# Patient Record
Sex: Female | Born: 1960 | Race: White | Hispanic: No | Smoking: Former smoker
Health system: Southern US, Community
[De-identification: ages and names within clinical notes are randomized; demographics above are authoritative.]

## PROBLEM LIST (undated history)

## (undated) DIAGNOSIS — I5032 Chronic diastolic (congestive) heart failure: Secondary | ICD-10-CM

## (undated) DIAGNOSIS — C3490 Malignant neoplasm of unspecified part of unspecified bronchus or lung: Secondary | ICD-10-CM

## (undated) DIAGNOSIS — R32 Unspecified urinary incontinence: Secondary | ICD-10-CM

## (undated) DIAGNOSIS — I10 Essential (primary) hypertension: Secondary | ICD-10-CM

## (undated) DIAGNOSIS — E119 Type 2 diabetes mellitus without complications: Secondary | ICD-10-CM

## (undated) DIAGNOSIS — I82409 Acute embolism and thrombosis of unspecified deep veins of unspecified lower extremity: Secondary | ICD-10-CM

## (undated) DIAGNOSIS — J189 Pneumonia, unspecified organism: Secondary | ICD-10-CM

## (undated) HISTORY — DX: Malignant neoplasm of unspecified part of unspecified bronchus or lung: C34.90

## (undated) HISTORY — PX: BLADDER SURGERY: SHX569

---

## 2007-08-23 ENCOUNTER — Emergency Department: Payer: Self-pay | Admitting: Unknown Physician Specialty

## 2011-04-25 ENCOUNTER — Emergency Department: Payer: Self-pay | Admitting: Emergency Medicine

## 2011-05-11 ENCOUNTER — Emergency Department: Payer: Self-pay | Admitting: Emergency Medicine

## 2011-07-04 ENCOUNTER — Ambulatory Visit: Payer: Self-pay | Admitting: Unknown Physician Specialty

## 2011-07-06 ENCOUNTER — Ambulatory Visit: Payer: Self-pay | Admitting: Unknown Physician Specialty

## 2013-01-09 ENCOUNTER — Inpatient Hospital Stay: Payer: Self-pay | Admitting: Internal Medicine

## 2013-01-09 LAB — CBC WITH DIFFERENTIAL/PLATELET
Basophil #: 0.1 10*3/uL (ref 0.0–0.1)
Basophil %: 1.1 %
Eosinophil #: 0.2 10*3/uL (ref 0.0–0.7)
Eosinophil %: 2 %
HCT: 39.6 % (ref 35.0–47.0)
HGB: 13.5 g/dL (ref 12.0–16.0)
LYMPHS ABS: 2.2 10*3/uL (ref 1.0–3.6)
Lymphocyte %: 25.3 %
MCH: 30.6 pg (ref 26.0–34.0)
MCHC: 34.1 g/dL (ref 32.0–36.0)
MCV: 90 fL (ref 80–100)
MONOS PCT: 5.3 %
Monocyte #: 0.5 x10 3/mm (ref 0.2–0.9)
NEUTROS PCT: 66.3 %
Neutrophil #: 5.7 10*3/uL (ref 1.4–6.5)
Platelet: 245 10*3/uL (ref 150–440)
RBC: 4.41 10*6/uL (ref 3.80–5.20)
RDW: 13.7 % (ref 11.5–14.5)
WBC: 8.6 10*3/uL (ref 3.6–11.0)

## 2013-01-09 LAB — BASIC METABOLIC PANEL
ANION GAP: 4 — AB (ref 7–16)
BUN: 14 mg/dL (ref 7–18)
CALCIUM: 8.6 mg/dL (ref 8.5–10.1)
Chloride: 106 mmol/L (ref 98–107)
Co2: 29 mmol/L (ref 21–32)
Creatinine: 0.91 mg/dL (ref 0.60–1.30)
GLUCOSE: 122 mg/dL — AB (ref 65–99)
OSMOLALITY: 279 (ref 275–301)
Potassium: 3.5 mmol/L (ref 3.5–5.1)
SODIUM: 139 mmol/L (ref 136–145)

## 2013-01-09 LAB — TROPONIN I: Troponin-I: <0.02 ng/mL

## 2013-01-10 LAB — SEDIMENTATION RATE: Erythrocyte Sed Rate: 6 mm/hr (ref 0–30)

## 2013-01-11 ENCOUNTER — Inpatient Hospital Stay (HOSPITAL_COMMUNITY)
Admission: RE | Admit: 2013-01-11 | Discharge: 2013-02-02 | DRG: 166 | Disposition: A | Discharge status: Home / Self Care | Payer: Medicaid Other | Source: Other Acute Inpatient Hospital | Attending: Pulmonary Disease | Admitting: Pulmonary Disease

## 2013-01-11 ENCOUNTER — Encounter (HOSPITAL_COMMUNITY): Payer: Self-pay | Admitting: *Deleted

## 2013-01-11 ENCOUNTER — Encounter (HOSPITAL_COMMUNITY)
Admission: RE | Disposition: A | Discharge status: Home / Self Care | Payer: Medicaid Other | Source: Other Acute Inpatient Hospital | Attending: Pulmonary Disease

## 2013-01-11 ENCOUNTER — Other Ambulatory Visit: Payer: Self-pay

## 2013-01-11 DIAGNOSIS — Z87891 Personal history of nicotine dependence: Secondary | ICD-10-CM

## 2013-01-11 DIAGNOSIS — Z79899 Other long term (current) drug therapy: Secondary | ICD-10-CM

## 2013-01-11 DIAGNOSIS — Z801 Family history of malignant neoplasm of trachea, bronchus and lung: Secondary | ICD-10-CM

## 2013-01-11 DIAGNOSIS — I318 Other specified diseases of pericardium: Secondary | ICD-10-CM | POA: Diagnosis present

## 2013-01-11 DIAGNOSIS — I319 Disease of pericardium, unspecified: Secondary | ICD-10-CM | POA: Diagnosis present

## 2013-01-11 DIAGNOSIS — I314 Cardiac tamponade: Secondary | ICD-10-CM | POA: Diagnosis present

## 2013-01-11 DIAGNOSIS — J189 Pneumonia, unspecified organism: Secondary | ICD-10-CM | POA: Diagnosis present

## 2013-01-11 DIAGNOSIS — J181 Lobar pneumonia, unspecified organism: Secondary | ICD-10-CM

## 2013-01-11 DIAGNOSIS — I369 Nonrheumatic tricuspid valve disorder, unspecified: Secondary | ICD-10-CM

## 2013-01-11 DIAGNOSIS — I4510 Unspecified right bundle-branch block: Secondary | ICD-10-CM | POA: Diagnosis present

## 2013-01-11 DIAGNOSIS — Z8042 Family history of malignant neoplasm of prostate: Secondary | ICD-10-CM

## 2013-01-11 DIAGNOSIS — Z8249 Family history of ischemic heart disease and other diseases of the circulatory system: Secondary | ICD-10-CM

## 2013-01-11 DIAGNOSIS — Z8261 Family history of arthritis: Secondary | ICD-10-CM

## 2013-01-11 DIAGNOSIS — R042 Hemoptysis: Secondary | ICD-10-CM | POA: Diagnosis not present

## 2013-01-11 DIAGNOSIS — R918 Other nonspecific abnormal finding of lung field: Secondary | ICD-10-CM

## 2013-01-11 DIAGNOSIS — J9819 Other pulmonary collapse: Secondary | ICD-10-CM | POA: Diagnosis present

## 2013-01-11 DIAGNOSIS — C7951 Secondary malignant neoplasm of bone: Secondary | ICD-10-CM | POA: Diagnosis present

## 2013-01-11 DIAGNOSIS — C3490 Malignant neoplasm of unspecified part of unspecified bronchus or lung: Secondary | ICD-10-CM

## 2013-01-11 DIAGNOSIS — C7952 Secondary malignant neoplasm of bone marrow: Secondary | ICD-10-CM

## 2013-01-11 DIAGNOSIS — C3410 Malignant neoplasm of upper lobe, unspecified bronchus or lung: Principal | ICD-10-CM | POA: Diagnosis present

## 2013-01-11 DIAGNOSIS — R0781 Pleurodynia: Secondary | ICD-10-CM

## 2013-01-11 DIAGNOSIS — Z803 Family history of malignant neoplasm of breast: Secondary | ICD-10-CM

## 2013-01-11 DIAGNOSIS — Z23 Encounter for immunization: Secondary | ICD-10-CM

## 2013-01-11 DIAGNOSIS — J90 Pleural effusion, not elsewhere classified: Secondary | ICD-10-CM | POA: Diagnosis present

## 2013-01-11 DIAGNOSIS — J9600 Acute respiratory failure, unspecified whether with hypoxia or hypercapnia: Secondary | ICD-10-CM | POA: Diagnosis not present

## 2013-01-11 HISTORY — PX: PERICARDIAL TAP: SHX5486

## 2013-01-11 HISTORY — DX: Pneumonia, unspecified organism: J18.9

## 2013-01-11 LAB — CBC
HCT: 43.6 % (ref 36.0–46.0)
Hemoglobin: 14.6 g/dL (ref 12.0–15.0)
MCH: 30.9 pg (ref 26.0–34.0)
MCHC: 33.5 g/dL (ref 30.0–36.0)
MCV: 92.4 fL (ref 78.0–100.0)
PLATELETS: 270 10*3/uL (ref 150–400)
RBC: 4.72 MIL/uL (ref 3.87–5.11)
RDW: 13.9 % (ref 11.5–15.5)
WBC: 13.8 10*3/uL — AB (ref 4.0–10.5)

## 2013-01-11 LAB — BODY FLUID CELL COUNT WITH DIFFERENTIAL
Eos, Fluid: 6 %
LYMPHS FL: 34 %
MONOCYTE-MACROPHAGE-SEROUS FLUID: 13 % — AB (ref 50–90)
Neutrophil Count, Fluid: 46 % — ABNORMAL HIGH (ref 0–25)
WBC FLUID: 4780 uL — AB (ref 0–1000)

## 2013-01-11 LAB — LACTATE DEHYDROGENASE, PLEURAL OR PERITONEAL FLUID: LD, Fluid: 1704 U/L — ABNORMAL HIGH (ref 3–23)

## 2013-01-11 LAB — PROTEIN, BODY FLUID: TOTAL PROTEIN, FLUID: 5.1 g/dL

## 2013-01-11 LAB — VANCOMYCIN, TROUGH: VANCOMYCIN, TROUGH: 8 ug/mL — AB (ref 10–20)

## 2013-01-11 LAB — CREATININE, FLUID (PLEURAL, PERITONEAL, JP DRAINAGE): Creat, Fluid: 0.8 mg/dL

## 2013-01-11 LAB — GRAM STAIN

## 2013-01-11 LAB — SEDIMENTATION RATE: SED RATE: 2 mm/h (ref 0–22)

## 2013-01-11 LAB — MRSA PCR SCREENING: MRSA by PCR: NEGATIVE

## 2013-01-11 LAB — CREATININE, SERUM: CREATININE: 0.72 mg/dL (ref 0.50–1.10)

## 2013-01-11 LAB — AMYLASE, BODY FLUID: Amylase, Fluid: 343 U/L

## 2013-01-11 LAB — GLUCOSE, SEROUS FLUID: GLUCOSE FL: 29 mg/dL

## 2013-01-11 SURGERY — PERICARDIAL TAP
Anesthesia: LOCAL

## 2013-01-11 MED ORDER — VANCOMYCIN HCL IN DEXTROSE 1-5 GM/200ML-% IV SOLN
1000.0000 mg | Freq: Two times a day (BID) | INTRAVENOUS | Status: DC
  Administered 2013-01-12 – 2013-01-14 (×5): 1000 mg via INTRAVENOUS
  Filled 2013-01-11 (×6): qty 200

## 2013-01-11 MED ORDER — PROMETHAZINE HCL 25 MG/ML IJ SOLN
12.5000 mg | Freq: Four times a day (QID) | INTRAMUSCULAR | Status: DC | PRN
  Administered 2013-01-11: 12.5 mg via INTRAVENOUS
  Administered 2013-01-11: 25 mg via INTRAVENOUS
  Administered 2013-01-12 (×3): 12.5 mg via INTRAVENOUS
  Administered 2013-01-13: 25 mg via INTRAVENOUS
  Administered 2013-01-13: 12.5 mg via INTRAVENOUS
  Administered 2013-01-14 (×2): 25 mg via INTRAVENOUS
  Administered 2013-01-15: 12.5 mg via INTRAVENOUS
  Administered 2013-01-15 – 2013-01-16 (×3): 25 mg via INTRAVENOUS
  Administered 2013-01-17 (×2): 12.5 mg via INTRAVENOUS
  Administered 2013-01-18 – 2013-01-21 (×7): 25 mg via INTRAVENOUS
  Administered 2013-01-22: 12.5 mg via INTRAVENOUS
  Filled 2013-01-11 (×23): qty 1

## 2013-01-11 MED ORDER — SODIUM CHLORIDE 0.9 % IJ SOLN
3.0000 mL | Freq: Two times a day (BID) | INTRAMUSCULAR | Status: DC
  Administered 2013-01-11 – 2013-02-02 (×29): 3 mL via INTRAVENOUS

## 2013-01-11 MED ORDER — SODIUM CHLORIDE 0.9 % IV SOLN
INTRAVENOUS | Status: DC
  Administered 2013-01-19: 10 mL/h via INTRAVENOUS
  Administered 2013-01-24: 20:00:00 via INTRAVENOUS

## 2013-01-11 MED ORDER — HYDROCODONE-ACETAMINOPHEN 5-325 MG PO TABS
1.0000 | ORAL_TABLET | ORAL | Status: DC | PRN
  Administered 2013-01-16 – 2013-02-02 (×41): 2 via ORAL
  Filled 2013-01-11 (×42): qty 2

## 2013-01-11 MED ORDER — ONDANSETRON HCL 4 MG/2ML IJ SOLN: 4.0000 mg | Freq: Four times a day (QID) | INTRAMUSCULAR | Status: DC | PRN

## 2013-01-11 MED ORDER — ONDANSETRON HCL 4 MG PO TABS
4.0000 mg | ORAL_TABLET | Freq: Four times a day (QID) | ORAL | Status: DC | PRN
Start: 2013-01-11 — End: 2013-01-13

## 2013-01-11 MED ORDER — IBUPROFEN 600 MG PO TABS
600.0000 mg | ORAL_TABLET | Freq: Four times a day (QID) | ORAL | Status: DC | PRN
  Administered 2013-01-15: 600 mg via ORAL
  Filled 2013-01-11 (×3): qty 1

## 2013-01-11 MED ORDER — ACETAMINOPHEN 325 MG PO TABS
650.0000 mg | ORAL_TABLET | Freq: Four times a day (QID) | ORAL | Status: DC | PRN
Start: 2013-01-11 — End: 2013-02-02

## 2013-01-11 MED ORDER — MORPHINE SULFATE 2 MG/ML IJ SOLN
2.0000 mg | INTRAMUSCULAR | Status: DC | PRN
  Administered 2013-01-11 – 2013-01-14 (×7): 2 mg via INTRAVENOUS
  Filled 2013-01-11 (×7): qty 1

## 2013-01-11 MED ORDER — ENOXAPARIN SODIUM 40 MG/0.4ML ~~LOC~~ SOLN
40.0000 mg | SUBCUTANEOUS | Status: DC
  Administered 2013-01-12 – 2013-02-02 (×21): 40 mg via SUBCUTANEOUS
  Filled 2013-01-11 (×22): qty 0.4

## 2013-01-11 MED ORDER — ZOLPIDEM TARTRATE 5 MG PO TABS
5.0000 mg | ORAL_TABLET | Freq: Every evening | ORAL | Status: DC | PRN
  Administered 2013-01-16 – 2013-02-01 (×13): 5 mg via ORAL
  Filled 2013-01-11 (×13): qty 1

## 2013-01-11 MED ORDER — PROMETHAZINE HCL 12.5 MG PO TABS: 12.5000 mg | ORAL_TABLET | Freq: Once | ORAL | Status: DC

## 2013-01-11 MED ORDER — VANCOMYCIN HCL IN DEXTROSE 1-5 GM/200ML-% IV SOLN
1000.0000 mg | Freq: Two times a day (BID) | INTRAVENOUS | Status: DC
  Filled 2013-01-11: qty 200

## 2013-01-11 MED ORDER — PIPERACILLIN-TAZOBACTAM 3.375 G IVPB
3.3750 g | Freq: Three times a day (TID) | INTRAVENOUS | Status: DC
  Filled 2013-01-11 (×2): qty 50

## 2013-01-11 MED ORDER — PIPERACILLIN-TAZOBACTAM 3.375 G IVPB
3.3750 g | Freq: Three times a day (TID) | INTRAVENOUS | Status: DC
  Administered 2013-01-11 – 2013-01-12 (×2): 3.375 g via INTRAVENOUS
  Filled 2013-01-11 (×5): qty 50

## 2013-01-11 MED ORDER — VANCOMYCIN HCL IN DEXTROSE 1-5 GM/200ML-% IV SOLN
1000.0000 mg | INTRAVENOUS | Status: AC
  Administered 2013-01-11: 1000 mg via INTRAVENOUS
  Filled 2013-01-11: qty 200

## 2013-01-11 MED ORDER — ACETAMINOPHEN 650 MG RE SUPP: 650.0000 mg | Freq: Four times a day (QID) | RECTAL | Status: DC | PRN

## 2013-01-11 MED ORDER — INFLUENZA VAC SPLIT QUAD 0.5 ML IM SUSP
0.5000 mL | INTRAMUSCULAR | Status: DC
  Filled 2013-01-11: qty 0.5

## 2013-01-11 NOTE — Progress Notes (Signed)
  Echocardiogram 2D Echocardiogram has been performed.  [name] FRANCES 01/11/2013, 5:21 PM

## 2013-01-11 NOTE — H&P (Signed)
History and Physical  Patient ID: [name] MRN: [id number], SOB: [birth year] 53 y.o. Date of Encounter: 01/11/2013, 2:11 PM  Primary Physician: No PCP Per Patient Primary Cardiologist: none  Chief Complaint: Shortness of Breath  HPI: 53 y.o. female who presented to [facility] on 01/11/2013 with complaints cardiac tamponade. The patient was admitted to [facility] 01/09/2013. She presented with shortness of breath progressive over 3 weeks. She's had a cough for several months. She's had no fever or chills. There has been no hemoptysis. Her initial evaluation in the emergency department there demonstrated a moderate-sized pericardial effusion as well as extensive right lung consolidation as well as a right-sided parapneumonic effusion. An echocardiogram was done this morning to evaluate her pericardial effusion. This demonstrated a large effusion with echo signs of cardiac tamponade. The patient was transferred emergently for pericardiocentesis.  On arrival here in the cardiac Cath Lab, she complains of continued shortness of breath. She denies orthopnea, PND, or edema. She's had no substernal chest pain. She has no personal history of cardiac disease. She quit smoking cigarettes in the 1990s.   Past Medical History  Diagnosis Date  . Pneumonia      Surgical History: History reviewed. No pertinent past surgical history.   Home Meds: Prior to Admission medications   Not on File    Allergies: No Known Allergies  History   Social History  . Marital Status: Single    Spouse Name: N/A    Number of Children: N/A  . Years of Education: N/A   Occupational History  . Not on file.   Social History Main Topics  . Smoking status: Former Research scientist (life sciences)  . Smokeless tobacco: Never Used  . Alcohol Use: Yes     Comment: occasionally has a drink  . Drug Use: No  . Sexual Activity: Not Currently   Other Topics Concern  . Not on file   Social History  Narrative  . No narrative on file     History reviewed. No pertinent family history.  Review of Systems: General: negative for chills, fever, night sweats or weight changes.  ENT: negative for rhinorrhea or epistaxis Cardiovascular: see history of present illness Dermatological: negative for rash Respiratory: See history of present illness GI: negative for nausea, vomiting, diarrhea, bright red blood per rectum, melena, or hematemesis GU: no hematuria, urgency, or frequency Neurologic: negative for visual changes, syncope, headache, or dizziness Heme: no easy bruising or bleeding Endo: negative for excessive thirst, thyroid disorder, or flushing Musculoskeletal: negative for joint pain or swelling, negative for myalgias All other systems reviewed and are otherwise negative except as noted above.  Physical Exam: Pulse 95, resp. rate 14, height 5\' 6"  (1.676 m), weight 76.6 kg (168 lb 14 oz), SpO2 96.00%. General: Well developed, well nourished, alert and oriented, in mild acute distress. HEENT: Normocephalic, atraumatic, sclera non-icteric, no xanthomas, nares are without discharge.  Neck: Supple. Carotids 2+ without bruits. JVP normal Lungs: Decreased breath sounds bilaterally right greater than left Breathing is unlabored. Heart: Tachycardic and regular with normal S1 and S2. No murmurs, rubs, or gallops appreciated. Abdomen: Soft, non-tender, non-distended with normoactive bowel sounds. No hepatomegaly. No rebound/guarding. No obvious abdominal masses. Back: No CVA tenderness Msk:  Strength and tone appear normal for age. Extremities: No clubbing, cyanosis, or edema.  Distal pedal pulses are 2+ and equal bilaterally. Neuro: CNII-XII intact, moves all extremities spontaneously. Psych:  Responds to questions appropriately with a normal affect.   Labs:  No results found for this basename: WBC,  HGB,  HCT,  MCV,  PLT   No results found for this basename: NA, K, CL, CO2, BUN,  CREATININE, CALCIUM, LABALBU, PROT, BILITOT, ALKPHOS, ALT, AST, GLUCOSE,  in the last 168 hours No results found for this basename: CKTOTAL, CKMB, TROPONINI,  in the last 72 hours No results found for this basename: CHOL,  HDL,  LDLCALC,  TRIG   No results found for this basename: DDIMER    Radiology/Studies:  No results found.   ASSESSMENT AND PLAN:  This is a 53 year-old woman with pericardial tamponade.   I have personally reviewed her echo images. She has a large circumferential pericardial effusion with echo evidence of cardiac tamponade. The patient is tachycardic and short of breath. I think emergency pericardiocentesis is clearly indicated. I have reviewed the risks, indications, and alternatives with the patient. She agrees to proceed.  Following the procedure, will admit to the cardiac ICU and monitor her pericardial drainage. Anticipate withdrawal of her pericardial tube within 24-48 hours. Will check a 2-D echocardiogram tomorrow. I last the hospitalist service to see her in consultation to help with treatment of her lung process. Appreciate their assistance in advance. Further plans pending the result of her pericardiocentesis.  Deatra James  01/11/2013, 2:11 PM

## 2013-01-11 NOTE — CV Procedure (Signed)
   Cardiac Catheterization Procedure Note  Name: [name] MRN: [id number] DOB: [birth year]  Procedure: Pericardiocentesis, fluoroscopic guidance  Indication: Cardiac tamponade  History: This is a 53 year old woman transferred from [facility] with evidence of cardiac And 9. She presented with several months of cough but worsening shortness of breath over 2 weeks. She was found to have a pericardial effusion on chest CT. This was thought to be related to multifocal pneumonia. An echocardiogram this morning demonstrated a large pericardial effusion with associated right ventricular collapse and significant variability of mitral inflow consistent with echo evidence of pericardial tap and on. She was transferred directly to the cardiac catheterization lab.  Procedural details: Risks, indications, and alternatives to pericardiocentesis were reviewed with the patient. Informed consent was obtained. The patient's subxiphoid area was prepped, draped, and anesthetized with 1% lidocaine. Using a lumbar puncture needle and stylette, the needle was passed under the xiphoid process and directed to the left posterior shoulder. The needle was advanced while infiltrating lidocaine and drawing back on the syringe. The pericardial space was entered and a wire encircled the heart. A pericardial drain was placed and 700 cc of bloody fluid was obtained.   The patient tolerated the patient well. There were no immediate procedural complications.   Sherren Mocha 01/11/2013, 1:37 PM

## 2013-01-11 NOTE — Progress Notes (Signed)
ANTIBIOTIC CONSULT NOTE - INITIAL  Pharmacy Consult for Vancomycin and Zosyn Indication: pneumonia  No Known Allergies  Patient Measurements: Height: 5\' 6"  (167.6 cm) Weight: 168 lb 14 oz (76.6 kg) IBW/kg (Calculated) : 59.3 Adjusted Body Weight:   Vital Signs: Temp: 97.9 F (36.6 C) (01/11 1600) Temp src: Oral (01/11 1600) BP: 112/57 mmHg (01/11 1800) Pulse Rate: 106 (01/11 1800) Intake/Output from previous day:   Intake/Output from this shift: Total I/O In: 435 [P.O.:240; I.V.:195] Out: 625 [Urine:375; Drains:250]  Labs: No results found for this basename: WBC, HGB, PLT, LABCREA, CREATININE,  in the last 72 hours CrCl is unknown because no creatinine reading has been taken. No results found for this basename: VANCOTROUGH, Corlis Leak, VANCORANDOM, [place], [place], [place], [place], TOBRAPEAK, TOBRARND, AMIKACINPEAK, AMIKACINTROU, AMIKACIN,  in the last 72 hours   Microbiology: Recent Results (from the past 720 hour(s))  GRAM STAIN     Status: None   Collection Time    01/11/13 12:10 PM      Result Value Range Status   Specimen Description PERICARDIAL FLUID   Final   Special Requests NONE   Final   Gram Stain     Final   Value: RARE WBC PRESENT,BOTH PMN AND MONONUCLEAR     NO ORGANISMS SEEN   Report Status 01/11/2013 FINAL   Final  MRSA PCR SCREENING     Status: None   Collection Time    01/11/13  1:13 PM      Result Value Range Status   MRSA by PCR NEGATIVE  NEGATIVE Final   Comment:            The GeneXpert MRSA Assay (FDA     approved for NASAL specimens     only), is one component of a     comprehensive MRSA colonization     surveillance program. It is not     intended to diagnose MRSA     infection nor to guide or     monitor treatment for     MRSA infections.    Medical History: Past Medical History  Diagnosis Date  . Pneumonia     Medications:  Scheduled:  . [START ON 01/12/2013] enoxaparin (LOVENOX) injection  40 mg Subcutaneous  Q24H  . [START ON 01/12/2013] influenza vac split quadrivalent PF  0.5 mL Intramuscular Tomorrow-1000  . sodium chloride  3 mL Intravenous Q12H   Assessment: 53yo female transferred from [facility] for pericardiocentesis, to continue Vancomycin and Zosyn for pneumonia.  Pericardial fluid cultures have been sent as well.  Per d/w Pharmacist at [facility], she was on 750mg  IV q12 with a subtherapeutic trough this AM.  Dose was increased to 1000mg  IV q12, but appears was not given as it was not charted.  Cr 0.91 on 1/9.  Goal of Therapy:  Vancomycin trough level 15-20 mcg/ml  Plan:  1-  Vancomycin 1000mg  IV q12, 1st dose now 2-  Zosyn 3.375gm IV q8, 4 hr infusion 3-  F/U cx and watch renal function  Gracy Bruins, [facility]

## 2013-01-11 NOTE — Consult Note (Signed)
[facility] Consult  [name] AJG:[id number] DOB: [birth year] DOA: 01/11/2013  Consulting physician: Dr. Burt Knack PCP: No PCP Per Patient    HPI: [name] is a 53 y.o. female with no significant medical history who presented to presented to [facility] on 01/09/13 for cough and shortness of breath.  Cough was non productive, no fevers chills or hemoptysis.  On presentation there was no fever and WBC normal. Chest xray showed extensive infiltrate of the right lung consistent with pneumonia and left perihilar infiltrate.  CT showed extensive multifocal infiltrates in the right middle and upper lobes with small right sided parapneumonic effusion.  Many nonspecific pulmonary nodules in both right and left lungs fields representing hematogenous distribution of infection or metastatic disease. Moderate pericardial effusion.  She was transferred to [facility] for pericardiocentesis.  Antibiotics Vancomycin 1/9>> Cefepime 1/9>>  Imaging (reports/CDs from [place] not in EPIC) CT chest 01/09/13: extensive multifocal infiltrates in the right middle and upper lobes with small right sided parapneumonic effusion.  Many nonspecific pulmonary nodules in both right and left lungs fields representing hematogenous distribution of infection or metastatic disease. Moderate pericardial effusion.   Review of Systems:  Constitutional: No weight loss, night sweats, Fevers, chills, fatigue.  HEENT: No headaches, Difficulty swallowing,Tooth/dental problems,Sore throat, No sneezing, itching, ear ache, nasal congestion, post nasal drip,  Cardio-vascular: No chest pain, Orthopnea, PND, swelling in lower extremities, anasarca, dizziness, palpitations  GI: No heartburn, indigestion, abdominal pain, nausea, vomiting, diarrhea, change in bowel habits, loss of appetite  Resp: Has had shortness of breath with exertion and at rest. No excess mucus, no productive cough, has had non-productive cough, No coughing up  of blood.No change in color of mucus.No wheezing.No chest wall deformity  Skin: no rash or lesions.  GU: no dysuria, change in color of urine, no urgency or frequency. No flank pain.  Musculoskeletal: No joint pain or swelling. No decreased range of motion. No back pain.  Psych: No change in mood or affect. No depression or anxiety. No memory loss.   Past Medical History  Diagnosis Date  . Pneumonia    History reviewed. No pertinent past surgical history. Social History:  reports that she has quit smoking. She has never used smokeless tobacco. She reports that she drinks alcohol. She reports that she does not use illicit drugs.  No Known Allergies  Family History  Problem Relation Age of Onset  . Coronary artery disease Mother 5   daughter with RA Mother with breast cancer  Prior to Admission medications   Not on File   Physical Exam: Filed Vitals:   01/11/13 1600  BP: 131/69  Pulse: 106  Temp:   Resp: 24    BP 131/69  Pulse 106  Temp(Src) 98.1 F (36.7 C) (Oral)  Resp 24  Ht 5' 6"  (1.676 m)  Wt 76.6 kg (168 lb 14 oz)  BMI 27.27 kg/m2  SpO2 95%  General:  Sedated, groggy, responds to voice  Eyes: PERRL, normal lids, irises & conjunctiva ENT: grossly normal hearing, lips & tongue are dry Neck: no LAD, masses or thyromegaly Cardiovascular: tachycardic, regular, no m/r/g. No LE edema. Telemetry: SR, no arrhythmias  Respiratory: diffuse crackles, no w/r/r. Rapid shallow respirations Abdomen: soft, ntnd Skin: no rash Musculoskeletal: grossly normal tone BUE/BLE Psychiatric: slightly sedated, calm Neurologic: grossly non-focal.          Labs on Admission:  Basic Metabolic Panel: No results found for this basename: NA, K, CL, CO2, GLUCOSE, BUN, CREATININE, CALCIUM, MG,  PHOS,  in the last 168 hours Liver Function Tests: No results found for this basename: AST, ALT, ALKPHOS, BILITOT, PROT, ALBUMIN,  in the last 168 hours No results found for this basename:  LIPASE, AMYLASE,  in the last 168 hours No results found for this basename: AMMONIA,  in the last 168 hours CBC: No results found for this basename: WBC, NEUTROABS, HGB, HCT, MCV, PLT,  in the last 168 hours Cardiac Enzymes: No results found for this basename: CKTOTAL, CKMB, CKMBINDEX, TROPONINI,  in the last 168 hours  BNP (last 3 results) No results found for this basename: PROBNP,  in the last 8760 hours CBG: No results found for this basename: GLUCAP,  in the last 168 hours  Radiological Exams on Admission: No results found.  EKG: NSR  Assessment/Plan Active Problems:   Pericardial tamponade   1. Pericardial effusion with Abnormal findings on CT of lung: DDx includes infection, autoimmune disorder and malignancy.  Send pericardial fluid for gram stain, AFB, culture, cytology  Check ANA, RF, CCP, ESR, ANCA  Await cytology report for further malignancy work up- she is not up to date on mammogram, her mother had breast cancer, she does have significant risk factors  Will continue broad spectrum antibiotics until pericardial fluid results are back due to possibility of infection.    Code Status: full Family Communication: spoke with patient and daughter at bedside  Time spent: 13 minutes  [facility] Pager [phone]

## 2013-01-11 NOTE — Progress Notes (Signed)
53 year old woman brought directly to the cardiac catheterization lab from [facility].  The patient is hospitalized with shortness of breath. She was found to have multifocal pneumonia and a large pericardial effusion. I was called by Dr. Fletcher Anon this morning who has read her echocardiogram. She has a large circumferential pericardial effusion with echo evidence of tamponade.  I have personally reviewed her echo images. There is a is clear evidence of tamponade with RV/RA collapse.  I have explained the risks, indications, and alternatives to pericardiocentesis with the patient. She understands and agrees to proceed.  Full H&P to follow.  Sherren Mocha 01/11/2013 11:39 AM

## 2013-01-12 ENCOUNTER — Inpatient Hospital Stay (HOSPITAL_COMMUNITY): Payer: Medicaid Other

## 2013-01-12 DIAGNOSIS — I319 Disease of pericardium, unspecified: Secondary | ICD-10-CM

## 2013-01-12 DIAGNOSIS — I314 Cardiac tamponade: Secondary | ICD-10-CM

## 2013-01-12 DIAGNOSIS — R918 Other nonspecific abnormal finding of lung field: Secondary | ICD-10-CM

## 2013-01-12 DIAGNOSIS — J13 Pneumonia due to Streptococcus pneumoniae: Secondary | ICD-10-CM

## 2013-01-12 DIAGNOSIS — C3490 Malignant neoplasm of unspecified part of unspecified bronchus or lung: Secondary | ICD-10-CM | POA: Diagnosis present

## 2013-01-12 LAB — BASIC METABOLIC PANEL
BUN: 12 mg/dL (ref 6–23)
CO2: 25 meq/L (ref 19–32)
Calcium: 8.4 mg/dL (ref 8.4–10.5)
Chloride: 103 mEq/L (ref 96–112)
Creatinine, Ser: 0.83 mg/dL (ref 0.50–1.10)
GFR calc Af Amer: >90 mL/min (ref >90)
GFR calc non Af Amer: 81 mL/min — ABNORMAL LOW (ref >90)
GLUCOSE: 127 mg/dL — AB (ref 70–99)
POTASSIUM: 3.7 meq/L (ref 3.7–5.3)
Sodium: 141 mEq/L (ref 137–147)

## 2013-01-12 LAB — CYCLIC CITRUL PEPTIDE ANTIBODY, IGG: Cyclic Citrullin Peptide Ab: <2 U/mL (ref 0.0–5.0)

## 2013-01-12 LAB — CBC
HCT: 38.7 % (ref 36.0–46.0)
Hemoglobin: 12.9 g/dL (ref 12.0–15.0)
MCH: 30.4 pg (ref 26.0–34.0)
MCHC: 33.3 g/dL (ref 30.0–36.0)
MCV: 91.1 fL (ref 78.0–100.0)
PLATELETS: 215 10*3/uL (ref 150–400)
RBC: 4.25 MIL/uL (ref 3.87–5.11)
RDW: 13.8 % (ref 11.5–15.5)
WBC: 7.7 10*3/uL (ref 4.0–10.5)

## 2013-01-12 LAB — ANCA SCREEN W REFLEX TITER
ATYPICAL P-ANCA SCREEN: NEGATIVE
c-ANCA Screen: NEGATIVE
p-ANCA Screen: NEGATIVE

## 2013-01-12 LAB — STREP PNEUMONIAE URINARY ANTIGEN: STREP PNEUMO URINARY ANTIGEN: NEGATIVE

## 2013-01-12 LAB — COMPREHENSIVE METABOLIC PANEL
ALK PHOS: 81 U/L (ref 39–117)
ALT: 29 U/L (ref 0–35)
AST: 16 U/L (ref 0–37)
Albumin: 2.9 g/dL — ABNORMAL LOW (ref 3.5–5.2)
BILIRUBIN TOTAL: 0.7 mg/dL (ref 0.3–1.2)
BUN: 12 mg/dL (ref 6–23)
CHLORIDE: 103 meq/L (ref 96–112)
CO2: 27 meq/L (ref 19–32)
Calcium: 8.3 mg/dL — ABNORMAL LOW (ref 8.4–10.5)
Creatinine, Ser: 0.85 mg/dL (ref 0.50–1.10)
GFR calc Af Amer: >90 mL/min (ref >90)
GFR, EST NON AFRICAN AMERICAN: 79 mL/min — AB (ref >90)
Glucose, Bld: 104 mg/dL — ABNORMAL HIGH (ref 70–99)
POTASSIUM: 3.8 meq/L (ref 3.7–5.3)
SODIUM: 142 meq/L (ref 137–147)
Total Protein: 5.7 g/dL — ABNORMAL LOW (ref 6.0–8.3)

## 2013-01-12 LAB — RHEUMATOID FACTOR: Rhuematoid fact SerPl-aCnc: <10 IU/mL (ref <=14)

## 2013-01-12 LAB — PH, BODY FLUID: pH, Fluid: 8

## 2013-01-12 LAB — TSH: TSH: 4.07 u[IU]/mL (ref 0.350–4.500)

## 2013-01-12 LAB — ANA: Anti Nuclear Antibody(ANA): NEGATIVE

## 2013-01-12 MED ORDER — FUROSEMIDE 10 MG/ML IJ SOLN
20.0000 mg | Freq: Once | INTRAMUSCULAR | Status: AC
  Administered 2013-01-12: 20 mg via INTRAVENOUS

## 2013-01-12 MED ORDER — LEVOFLOXACIN IN D5W 750 MG/150ML IV SOLN
750.0000 mg | INTRAVENOUS | Status: DC
  Administered 2013-01-12 – 2013-01-14 (×3): 750 mg via INTRAVENOUS
  Filled 2013-01-12 (×4): qty 150

## 2013-01-12 MED FILL — Lidocaine HCl Local Preservative Free (PF) Inj 1%: INTRAMUSCULAR | Qty: 30 | Status: AC

## 2013-01-12 MED FILL — Midazolam HCl Inj 2 MG/2ML (Base Equivalent): INTRAMUSCULAR | Qty: 2 | Status: AC

## 2013-01-12 MED FILL — Heparin Sodium (Porcine) 2 Unit/ML in Sodium Chloride 0.9%: INTRAMUSCULAR | Qty: 500 | Status: AC

## 2013-01-12 MED FILL — Fentanyl Citrate Inj 0.05 MG/ML: INTRAMUSCULAR | Qty: 2 | Status: AC

## 2013-01-12 NOTE — Consult Note (Signed)
PULMONARY/CCM NOTE  Requesting MD/Service: NIshan/Cards Date of admission: 1/11 Date of consult: 1/12 Reason for consultation: abnormal CXR  Pt Profile:  Previously healthy 36 yoF admitted to [facility] 1/09 with progressive dyspnea and transferred to [facility] (Cards Service) 1/11 for eval and mgmt of pericardial effusion and early tamponade. Underwent pericardial drain placement on admission with 700 cc's of bloody fluid removed. Repeat CXR 1/12 with worsening infiltrates. PCCM asked to eval     HPI:  As above. She indicates that she has noted exertional dyspnea progressive since Sept 2014. Her symptoms became intolerable by the time that she presented to [place]. She has not noted any fevers or weight loss. She has largely nonproductive cough. Denies hemoptysis and CP. Has no known TB exposures or other sick contacts. No prior TB skin tests. No HIV RFs. No significant occupational exposures.   PMH: No chronic illnesses. No major surgeries. No chronic medications  MEDICATIONS:  Reviewed  History   Social History  . Marital Status: Single    Spouse Name: N/A    Number of Children: N/A  . Years of Education: N/A   Occupational History  . Not on file.   Social History Main Topics  . Smoking status: Former Research scientist (life sciences)  . Smokeless tobacco: Never Used  . Alcohol Use: Yes     Comment: occasionally has a drink  . Drug Use: No  . Sexual Activity: Not Currently   Other Topics Concern  . Not on file   Social History Narrative   The patient is single. She has 3 children. She is a nonsmoker. She does not drink alcohol.    Family History  Problem Relation Age of Onset  . Coronary artery disease Mother 28    ROS - As per HPI. Otherwise a detailed ROS is N/C  Filed Vitals:   01/12/13 1200 01/12/13 1400 01/12/13 1600 01/12/13 1800  BP: 112/85 100/79 100/70 101/72  Pulse: 102 103 105 107  Temp: 98.3 F (36.8 C)  98.8 F (37.1 C)   TempSrc: Oral  Oral   Resp: 24 26 21 19    Height:      Weight:      SpO2: 93% 90% 92% 94%    EXAM:  Gen: WDWN in NAD HEENT: NCAT, EOMI, PERRL Neck: No JVD or LAN Lungs: Rales in RUL posteriorly, slight rhonchi anteriorly, no wheezes Cardiovascular: RRR s M Abdomen: Soft, NT, NABS Ext: no C/C/E Neuro: No focal deficits  DATA:  I have reviewed all of today's lab results. Relevant abnormalities are discussed in the A/P section  CXR 1/09: Diffuse infiltrate on R, predominantly RUL CT chest 1/09: Extensive infiltrate in RUL/RML, reticulonodular infiltrate in RLL and lesser extent on L, small R effusion CXR 1/12: progressive consolidation RUL. Increased R effusion. Increased IS prominence on L  IMPRESSION:   Subacute dyspnea and cough since 09/2012 Extensive pulmonary infiltrates R>L with consolidation RML,RUL and reticulonodular changes throughout Large pericardial effusion with tamponade - s/p drainage  DISC: Constellation of findings most worrisome for malignancy vs systemic infection such as TB  PLAN:  F/U pericardial fluid cytology and AFB studies Check urine strep and legionella antigens Check Quantiferon Gold Sputum for AFB X 3 Check HIV serologies Cont vanc Change pip-tazo to Levofloxacin Follow CXR Consider FOB depending on results of above    Merton Border, MD ; [facility] service Mobile [phone].  After 5:30 PM or weekends, call [phone]

## 2013-01-12 NOTE — Progress Notes (Signed)
Echocardiogram 2D Echocardiogram limited has been performed.  [name] 01/12/2013, 3:37 PM

## 2013-01-12 NOTE — Progress Notes (Signed)
Patient ID: [name], female   DOB: [birth year], 53 y.o.   MRN: [id number]    Subjective:  Still with dyspnea   Objective:  Filed Vitals:   01/12/13 0400 01/12/13 0500 01/12/13 0600 01/12/13 0700  BP: 115/69 113/85 105/65 106/71  Pulse: 92 95 89 91  Temp:      TempSrc:      Resp: 13 22 15 16   Height:      Weight:      SpO2: 94% 96% 95% 94%    Intake/Output from previous day:  Intake/Output Summary (Last 24 hours) at 01/12/13 0851 Last data filed at 01/12/13 0700  Gross per 24 hour  Intake  842.5 ml  Output    925 ml  Net  -82.5 ml    Physical Exam: Affect appropriate Obese white female  HEENT: normal Neck supple with no adenopathy JVP normal no bruits no thyromegaly Lungs clear with no wheezing and good diaphragmatic motion Heart:  S1/S2 no murmur, no rub, gallop or click PMI normal Abdomen: benighn, BS positve, no tenderness, no AAA  Subxiphoid pericardial drain no bruit.  No HSM or HJR Distal pulses intact with no bruits No edema Neuro non-focal Skin warm and dry No muscular weakness   Lab Results: Basic Metabolic Panel:  Recent Labs  01/11/13 1720 01/12/13 0240  NA  --  142  K  --  3.8  CL  --  103  CO2  --  27  GLUCOSE  --  104*  BUN  --  12  CREATININE 0.72 0.85  CALCIUM  --  8.3*   Liver Function Tests:  Recent Labs  01/12/13 0240  AST 16  ALT 29  ALKPHOS 81  BILITOT 0.7  PROT 5.7*  ALBUMIN 2.9*   CBC:  Recent Labs  01/11/13 1720 01/12/13 0240  WBC 13.8* 7.7  HGB 14.6 12.9  HCT 43.6 38.7  MCV 92.4 91.1  PLT 270 215   Thyroid Function Tests:  Recent Labs  01/11/13 1720  TSH 4.070    Imaging: Portable Chest 1 View  01/12/2013   CLINICAL DATA:  Dyspnea and possible pneumonia  EXAM: PORTABLE CHEST - 1 VIEW  COMPARISON:  PA and lateral chest x-ray dated January 09, 2013 and a chest CT scan of the same day  FINDINGS: There has developed dense consolidation of the right upper lobe. The right costophrenic angle is blunted  and there is obscuration of the right hemidiaphragm consistent with pleural effusion. There is likely collapse of the right middle lobe in addition to the right upper lobe. The left lung is well-expanded. The interstitial markings of the left lung are diffusely more prominent today. The cardiac silhouette is not enlarged.  IMPRESSION: The findings are worrisome for near total right upper lobe collapse as well as right middle lobe collapse and a right pleural effusion. This has progressed significantly since the previous study. There have also developed progressively increased interstitial markings in the left lung consistent with interstitial edema or developing pneumonia.   Electronically Signed   By: David  Martinique   On: 01/12/2013 07:42    Cardiac Studies:  ECG:   NSR low voltage nonspecific ST/T wave changes    Telemetry:  NSR    Echo:   [place] tamponade  Repeat pending today   Medications:   . enoxaparin (LOVENOX) injection  40 mg Subcutaneous Q24H  . influenza vac split quadrivalent PF  0.5 mL Intramuscular Tomorrow-1000  . piperacillin-tazobactam (ZOSYN)  IV  3.375 g  Intravenous Q8H  . sodium chloride  3 mL Intravenous Q12H  . vancomycin  1,000 mg Intravenous Q12H     . sodium chloride Stopped (01/11/13 2200)    Assessment/Plan:  Pericardial Effusion:  S/P drainage  75cc total output  Check limited bedside echo today If no effusion will pull drain 1/13 [facility];s note indicated bloody effusion cytology and indices pending Pulm:  CXR with ? RUL/RML collapse  Still dyspnic  Will ask pulmonary to see regarding need for repeat CT and bronchoscopy She quit smoking in 1998 ID:  Not acting like infection  May benefit from thoracentesis  Will ask pulmonary to decide.  Continue antibiotics  IM following as well.    Jenkins Rouge 01/12/2013, 8:51 AM

## 2013-01-12 NOTE — Care Management Note (Signed)
    Page 1 of 1   01/12/2013     11:05:19 AM   CARE MANAGEMENT NOTE 01/12/2013  Patient:  [name], [name]   Account Number:  [id number]  Date Initiated:  01/12/2013  Documentation initiated by:  Elissa Hefty  Subjective/Objective Assessment:   adm w pericardial tamponade     Action/Plan:   lives alone   Anticipated DC Date:     Anticipated DC Plan:           Choice offered to / List presented to:             Status of service:   Medicare Important Message given?   (If response is "NO", the following Medicare IM given date fields will be blank) Date Medicare IM given:   Date Additional Medicare IM given:    Discharge Disposition:    Per UR Regulation:  Reviewed for med. necessity/level of care/duration of stay  If discussed at [place] of Stay Meetings, dates discussed:    Comments:

## 2013-01-12 NOTE — Consult Note (Signed)
[facility]  Consult NOTE  [name] WNU:[id number] DOB: 1960/10/27 DOA: 01/11/2013 PCP: No PCP Per Patient  Assessment/Plan: 1. Pericardial effusion with tamponade: tamponade physiology seems resolved after pericardiocentesis.  Still producing serosanguinous fluid from drain.  High WBCs in pericardial fluid, gram stain WBC's no organisms, culture no growth to date, high LDH, AFB pending, cytology pending.  Rheum labs initially negative for RA/lupus other autoimmune disorder CCP, ANCA, ANA pending. 2. Pneumonia vs pulmonary malignancy: Chest xray with worsening aeration and partial collapse right upper and middle lobes and right pleural effusion.  Continue broad spectrum antibiotics for possible infection though no increase in peripheral WBC's and only one measured high temp of 100.  Have discussed with ID and will not place on TB precautions as no risk factors for exposure, no fevers, sweats, weight loss, hemoptysis. Have consulted pulmonology for further recommendations, possible thoracentesis. 3. Overall picture most concerning for malignancy.  She is at high risk as she has not followed with any type of screening, no mammograms.  Mother with breast cancer in early 42's.  Await cytology results. Consider CT abdomen.  Code Status: full Family Communication: spoke with patient at bedside Disposition Plan: inpatient  Procedures:  Pericardiocentesis 1/11  Imaging (reports/CDs from [place] not in EPIC)  CT chest 01/09/13: extensive multifocal infiltrates in the right middle and upper lobes with small right sided parapneumonic effusion. Many nonspecific pulmonary nodules in both right and left lungs fields representing hematogenous distribution of infection or metastatic disease. Moderate pericardial effusion.   Antibiotics: Vancomycin 1/9>>  Cefepime 1/9>>1/11 Zosyn 1/11>>  HPI/Subjective: [name] is a 53 y.o. female with no significant medical history who presented to presented  to [facility] on 01/09/13 for cough and shortness of breath. Cough was non productive, no fevers chills or hemoptysis. On presentation there was no fever and WBC normal. Chest xray showed extensive infiltrate of the right lung consistent with pneumonia and left perihilar infiltrate. CT showed extensive multifocal infiltrates in the right middle and upper lobes with small right sided parapneumonic effusion. Many nonspecific pulmonary nodules in both right and left lungs fields representing hematogenous distribution of infection or metastatic disease. Moderate pericardial effusion. She was transferred to [facility] for pericardiocentesis.  Today she is alert, oriented comfortable. No respiratory distress at rest. Minimal pain from drain site.  Objective: Filed Vitals:   01/12/13 0700  BP: 106/71  Pulse: 91  Temp:   Resp: 16    Intake/Output Summary (Last 24 hours) at 01/12/13 0838 Last data filed at 01/12/13 0700  Gross per 24 hour  Intake  842.5 ml  Output    925 ml  Net  -82.5 ml   Filed Weights   01/11/13 1300 01/12/13 0324  Weight: 76.6 kg (168 lb 14 oz) 75.1 kg (165 lb 9.1 oz)    Exam:   General:  Alert, calm, no distress  Cardiovascular: RRR no mrg  Respiratory: shallow guarded resps, decreased breath sounds in right lung fields, no rhonchi, wheezes or cough  Abdomen: BS+, soft, slightly tender to palpation diffusely  Musculoskeletal: no edema  Data Reviewed: Basic Metabolic Panel:  Recent Labs Lab 01/11/13 1720 01/12/13 0240  NA  --  142  K  --  3.8  CL  --  103  CO2  --  27  GLUCOSE  --  104*  BUN  --  12  CREATININE 0.72 0.85  CALCIUM  --  8.3*   Liver Function Tests:  Recent Labs Lab 01/12/13 0240  AST 16  ALT 29  ALKPHOS 81  BILITOT 0.7  PROT 5.7*  ALBUMIN 2.9*   No results found for this basename: LIPASE, AMYLASE,  in the last 168 hours No results found for this basename: AMMONIA,  in the last 168 hours CBC:  Recent Labs Lab  01/11/13 1720 01/12/13 0240  WBC 13.8* 7.7  HGB 14.6 12.9  HCT 43.6 38.7  MCV 92.4 91.1  PLT 270 215   Cardiac Enzymes: No results found for this basename: CKTOTAL, CKMB, CKMBINDEX, TROPONINI,  in the last 168 hours BNP (last 3 results) No results found for this basename: PROBNP,  in the last 8760 hours CBG: No results found for this basename: GLUCAP,  in the last 168 hours  Recent Results (from the past 240 hour(s))  BODY FLUID CULTURE     Status: None   Collection Time    01/11/13 12:10 PM      Result Value Range Status   Specimen Description PERICARDIAL FLUID   Final   Special Requests NONE   Final   Gram Stain     Final   Value: RARE WBC PRESENT,BOTH PMN AND MONONUCLEAR     NO ORGANISMS SEEN     Performed at [facility]     Performed at [facility]   Culture     Final   Value: NO GROWTH 1 DAY     Performed at [facility]   Report Status PENDING   Incomplete  GRAM STAIN     Status: None   Collection Time    01/11/13 12:10 PM      Result Value Range Status   Specimen Description PERICARDIAL FLUID   Final   Special Requests NONE   Final   Gram Stain     Final   Value: RARE WBC PRESENT,BOTH PMN AND MONONUCLEAR     NO ORGANISMS SEEN   Report Status 01/11/2013 FINAL   Final  MRSA PCR SCREENING     Status: None   Collection Time    01/11/13  1:13 PM      Result Value Range Status   MRSA by PCR NEGATIVE  NEGATIVE Final   Comment:            The GeneXpert MRSA Assay (FDA     approved for NASAL specimens     only), is one component of a     comprehensive MRSA colonization     surveillance program. It is not     intended to diagnose MRSA     infection nor to guide or     monitor treatment for     MRSA infections.     Studies: Portable Chest 1 View  01/12/2013   CLINICAL DATA:  Dyspnea and possible pneumonia  EXAM: PORTABLE CHEST - 1 VIEW  COMPARISON:  PA and lateral chest x-ray dated January 09, 2013 and a chest CT scan of the same day   FINDINGS: There has developed dense consolidation of the right upper lobe. The right costophrenic angle is blunted and there is obscuration of the right hemidiaphragm consistent with pleural effusion. There is likely collapse of the right middle lobe in addition to the right upper lobe. The left lung is well-expanded. The interstitial markings of the left lung are diffusely more prominent today. The cardiac silhouette is not enlarged.  IMPRESSION: The findings are worrisome for near total right upper lobe collapse as well as right middle lobe collapse and a right pleural effusion.  This has progressed significantly since the previous study. There have also developed progressively increased interstitial markings in the left lung consistent with interstitial edema or developing pneumonia.   Electronically Signed   By: David  Martinique   On: 01/12/2013 07:42    Scheduled Meds: . enoxaparin (LOVENOX) injection  40 mg Subcutaneous Q24H  . influenza vac split quadrivalent PF  0.5 mL Intramuscular Tomorrow-1000  . piperacillin-tazobactam (ZOSYN)  IV  3.375 g Intravenous Q8H  . sodium chloride  3 mL Intravenous Q12H  . vancomycin  1,000 mg Intravenous Q12H   Continuous Infusions: . sodium chloride Stopped (01/11/13 2200)    Active Problems:   Pericardial tamponade    Time spent: 35 minutes    [place] [facility] Pager [phone]. If 7PM-7AM, please contact night-coverage at [email or web address], password [facility] 01/12/2013, 8:38 AM  LOS: 1 day

## 2013-01-13 ENCOUNTER — Inpatient Hospital Stay (HOSPITAL_COMMUNITY): Payer: Medicaid Other

## 2013-01-13 LAB — CBC
HEMATOCRIT: 42.3 % (ref 36.0–46.0)
HEMOGLOBIN: 14 g/dL (ref 12.0–15.0)
MCH: 30.4 pg (ref 26.0–34.0)
MCHC: 33.1 g/dL (ref 30.0–36.0)
MCV: 92 fL (ref 78.0–100.0)
Platelets: 227 10*3/uL (ref 150–400)
RBC: 4.6 MIL/uL (ref 3.87–5.11)
RDW: 13.7 % (ref 11.5–15.5)
WBC: 8.5 10*3/uL (ref 4.0–10.5)

## 2013-01-13 LAB — BASIC METABOLIC PANEL
BUN: 13 mg/dL (ref 6–23)
CO2: 28 mEq/L (ref 19–32)
Calcium: 8.9 mg/dL (ref 8.4–10.5)
Chloride: 99 mEq/L (ref 96–112)
Creatinine, Ser: 0.78 mg/dL (ref 0.50–1.10)
GFR calc Af Amer: >90 mL/min (ref >90)
GFR calc non Af Amer: >90 mL/min (ref >90)
GLUCOSE: 96 mg/dL (ref 70–99)
POTASSIUM: 3.6 meq/L — AB (ref 3.7–5.3)
SODIUM: 140 meq/L (ref 137–147)

## 2013-01-13 LAB — LEGIONELLA ANTIGEN, URINE: Legionella Antigen, Urine: NEGATIVE

## 2013-01-13 LAB — HIV ANTIBODY (ROUTINE TESTING W REFLEX): HIV: NONREACTIVE

## 2013-01-13 MED ORDER — FENTANYL CITRATE 0.05 MG/ML IJ SOLN
INTRAMUSCULAR | Status: AC
  Administered 2013-01-13: 50 ug
  Filled 2013-01-13: qty 2

## 2013-01-13 MED ORDER — PANTOPRAZOLE SODIUM 40 MG PO TBEC
40.0000 mg | DELAYED_RELEASE_TABLET | Freq: Every day | ORAL | Status: DC
  Administered 2013-01-13 – 2013-01-15 (×3): 40 mg via ORAL
  Filled 2013-01-13 (×3): qty 1

## 2013-01-13 NOTE — Consult Note (Signed)
[facility]  Consult NOTE  [name] NTI:[id number] DOB: 09/28/1960 DOA: 01/11/2013 PCP: No PCP Per Patient  Assessment/Plan: 1. Pericardial effusion with tamponade: drain removed.High WBCs in pericardial fluid, gram stain WBC's no organisms, culture no growth to date, high LDH, AFB negative 1/4, cytology with reactive mesothelial cells.  Rheum labs negative for RA/lupus other autoimmune disorder. 2. Pneumonia vs pulmonary malignancy: Chest stable with partial collapse right upper and middle lobes and right pleural effusion.  Continue broad spectrum antibiotics for possible infection though no increase in peripheral WBC's and only one measured high temp of 100.  Now on airborne precautions with quantiferon pending. Bronchoscopy planned for tomorrow.  3. Overall picture most concerning for malignancy. Possible mesothelioma.  Would consider oncology consultation.  Code Status: full Family Communication: spoke with patient at bedside Disposition Plan: inpatient  Procedures:  Pericardiocentesis 1/11  Imaging (reports/CDs from [place] not in EPIC)  CT chest 01/09/13: extensive multifocal infiltrates in the right middle and upper lobes with small right sided parapneumonic effusion. Many nonspecific pulmonary nodules in both right and left lungs fields representing hematogenous distribution of infection or metastatic disease. Moderate pericardial effusion.   Antibiotics: Vancomycin 1/9>>  Cefepime 1/9>>1/11 Zosyn 1/11>>1/12 levoquin 1/12>>  HPI/Subjective: [name] is a 53 y.o. female with no significant medical history who presented to presented to [facility] on 01/09/13 for cough and shortness of breath. Cough was non productive, no fevers chills or hemoptysis. On presentation there was no fever and WBC normal. Chest xray showed extensive infiltrate of the right lung consistent with pneumonia and left perihilar infiltrate. CT showed extensive multifocal infiltrates in  the right middle and upper lobes with small right sided parapneumonic effusion. Many nonspecific pulmonary nodules in both right and left lungs fields representing hematogenous distribution of infection or metastatic disease. Moderate pericardial effusion. She was transferred to [facility] for pericardiocentesis.  Today she is alert, oriented comfortable. No respiratory distress. More comfortable after removal of drain.  Objective: Filed Vitals:   01/13/13 1500  BP: 108/65  Pulse: 113  Temp:   Resp: 30    Intake/Output Summary (Last 24 hours) at 01/13/13 1646 Last data filed at 01/13/13 1501  Gross per 24 hour  Intake   1385 ml  Output     80 ml  Net   1305 ml   Filed Weights   01/11/13 1300 01/12/13 0324 01/13/13 0600  Weight: 76.6 kg (168 lb 14 oz) 75.1 kg (165 lb 9.1 oz) 74.2 kg (163 lb 9.3 oz)    Exam:   General:  Alert, calm, no distress  Cardiovascular: RRR no mrg  Respiratory: scattered wheezes, good air movement  Abdomen: BS+, soft, slightly tender to palpation diffusely  Musculoskeletal: no edema  Data Reviewed: Basic Metabolic Panel:  Recent Labs Lab 01/11/13 1720 01/12/13 0240 01/12/13 0930 01/13/13 0241  NA  --  142 141 140  K  --  3.8 3.7 3.6*  CL  --  103 103 99  CO2  --  27 25 28   GLUCOSE  --  104* 127* 96  BUN  --  12 12 13   CREATININE 0.72 0.85 0.83 0.78  CALCIUM  --  8.3* 8.4 8.9   Liver Function Tests:  Recent Labs Lab 01/12/13 0240  AST 16  ALT 29  ALKPHOS 81  BILITOT 0.7  PROT 5.7*  ALBUMIN 2.9*   No results found for this basename: LIPASE, AMYLASE,  in the last 168 hours No results found for this basename: AMMONIA,  in the last 168 hours CBC:  Recent Labs Lab 01/11/13 1720 01/12/13 0240 01/13/13 0241  WBC 13.8* 7.7 8.5  HGB 14.6 12.9 14.0  HCT 43.6 38.7 42.3  MCV 92.4 91.1 92.0  PLT 270 215 227   Cardiac Enzymes: No results found for this basename: CKTOTAL, CKMB, CKMBINDEX, TROPONINI,  in the last 168 hours BNP (last  3 results) No results found for this basename: PROBNP,  in the last 8760 hours CBG: No results found for this basename: GLUCAP,  in the last 168 hours  Recent Results (from the past 240 hour(s))  BODY FLUID CULTURE     Status: None   Collection Time    01/11/13 12:10 PM      Result Value Range Status   Specimen Description PERICARDIAL FLUID   Final   Special Requests NONE   Final   Gram Stain     Final   Value: RARE WBC PRESENT,BOTH PMN AND MONONUCLEAR     NO ORGANISMS SEEN     Performed at [facility]     Performed at [facility]   Culture     Final   Value: NO GROWTH 2 DAYS     Performed at [facility]   Report Status PENDING   Incomplete  GRAM STAIN     Status: None   Collection Time    01/11/13 12:10 PM      Result Value Range Status   Specimen Description PERICARDIAL FLUID   Final   Special Requests NONE   Final   Gram Stain     Final   Value: RARE WBC PRESENT,BOTH PMN AND MONONUCLEAR     NO ORGANISMS SEEN   Report Status 01/11/2013 FINAL   Final  AFB CULTURE WITH SMEAR     Status: None   Collection Time    01/11/13 12:10 PM      Result Value Range Status   Specimen Description FLUID PERICARDIAL   Final   Special Requests ADD    Final   ACID FAST SMEAR     Final   Value: NO ACID FAST BACILLI SEEN     Performed at [facility]   Culture     Final   Value: CULTURE WILL BE EXAMINED FOR 6 WEEKS BEFORE ISSUING A FINAL REPORT     Performed at [facility]   Report Status PENDING   Incomplete  MRSA PCR SCREENING     Status: None   Collection Time    01/11/13  1:13 PM      Result Value Range Status   MRSA by PCR NEGATIVE  NEGATIVE Final   Comment:            The GeneXpert MRSA Assay (FDA     approved for NASAL specimens     only), is one component of a     comprehensive MRSA colonization     surveillance program. It is not     intended to diagnose MRSA     infection nor to guide or     monitor treatment for     MRSA  infections.     Studies: Dg Chest Port 1 View  01/13/2013   CLINICAL DATA:  Respiratory failure  EXAM: PORTABLE CHEST - 1 VIEW  COMPARISON:  01/12/2013  FINDINGS: Dense consolidation in the right upper lobe similar to the prior study. Right lower lobe infiltrate also unchanged. There is a right pleural effusion.  Mild left lower lobe  infiltrate and small effusion. Pericardial drain is noted. Negative for heart failure.  IMPRESSION: No significant change dense consolidation right upper lobe. There also is bibasilar atelectasis and small effusions.   Electronically Signed   By: Franchot Gallo M.D.   On: 01/13/2013 07:22   Portable Chest 1 View  01/12/2013   CLINICAL DATA:  Dyspnea and possible pneumonia  EXAM: PORTABLE CHEST - 1 VIEW  COMPARISON:  PA and lateral chest x-ray dated January 09, 2013 and a chest CT scan of the same day  FINDINGS: There has developed dense consolidation of the right upper lobe. The right costophrenic angle is blunted and there is obscuration of the right hemidiaphragm consistent with pleural effusion. There is likely collapse of the right middle lobe in addition to the right upper lobe. The left lung is well-expanded. The interstitial markings of the left lung are diffusely more prominent today. The cardiac silhouette is not enlarged.  IMPRESSION: The findings are worrisome for near total right upper lobe collapse as well as right middle lobe collapse and a right pleural effusion. This has progressed significantly since the previous study. There have also developed progressively increased interstitial markings in the left lung consistent with interstitial edema or developing pneumonia.   Electronically Signed   By: David  Martinique   On: 01/12/2013 07:42    Scheduled Meds: . enoxaparin (LOVENOX) injection  40 mg Subcutaneous Q24H  . influenza vac split quadrivalent PF  0.5 mL Intramuscular Tomorrow-1000  . levofloxacin (LEVAQUIN) IV  750 mg Intravenous Q24H  . pantoprazole  40  mg Oral Daily  . sodium chloride  3 mL Intravenous Q12H  . vancomycin  1,000 mg Intravenous Q12H   Continuous Infusions: . sodium chloride Stopped (01/11/13 2200)    Active Problems:   Pericardial tamponade   Pulmonary infiltrates   Multifocal lung consolidation    Time spent: 35 minutes    [facility] [facility] Pager [phone]. If 7PM-7AM, please contact night-coverage at [email or web address], password [facility] 01/13/2013, 4:46 PM  LOS: 2 days

## 2013-01-13 NOTE — Progress Notes (Signed)
PULMONARY/CCM NOTE  Requesting MD/Service: NIshan/Cards Date of admission: 1/11 Date of consult: 1/12 Reason for consultation: abnormal CXR  Pt Profile:  Previously healthy 65 yoF admitted to [facility] 1/09 with progressive dyspnea and transferred to [facility] ([facility]) 1/11 for eval and mgmt of pericardial effusion and early tamponade. Underwent pericardial drain placement on admission with 700 cc's of bloody fluid removed. Repeat CXR 1/12 with worsening infiltrates. PCCM asked to eval     MICRO/PATH: Pericardial 1/11 >> GS negative, NGTD >>  Pericardial AFB 1/11 >> smear negative HIV serology 1/12 >> NEG Strep Ag 1/12 >> NEG Legionella Ag 1/12 >> NEG Pericardial cytology 1/11 >> atypical cells, likely reactive mesothelial cells  SUBJ: No new complaints. No overt distress  Filed Vitals:   01/13/13 1200 01/13/13 1300 01/13/13 1400 01/13/13 1500  BP: 116/71 97/76 122/70 108/65  Pulse: 109 119 113 113  Temp: 98.9 F (37.2 C)     TempSrc: Oral     Resp: 18 18 16 30   Height:      Weight:      SpO2: 92% 88% 94% 93%    EXAM:  Gen: WDWN in NAD HEENT: NCAT, EOMI, PERRL Neck: No JVD or LAN Lungs: Rales in RUL posteriorly, slight rhonchi anteriorly, no wheezes Cardiovascular: RRR s M Abdomen: Soft, NT, NABS Ext: no C/C/E Neuro: No focal deficits  DATA:  I have reviewed all of today's lab results. Relevant abnormalities are discussed in the A/P section  CXR: [place] consolidated RUL and R effusion  IMPRESSION:   Subacute dyspnea and cough since 09/2012 Extensive pulmonary infiltrates R>L with consolidation RML,RUL and reticulonodular changes throughout Large bloody pericardial effusion with tamponade - s/p drainage  DISC: Concern remains for malignancy vs chronic infectious such as TB/fungal  PLAN:  Check urine strep and legionella antigens F/U Quantiferon Gold Sputum for AFB X 3 - unable to produce any sputum Cont vanc and Levofloxacin Follow CXR  intermittently FOB planned 1/14 afternoon  If Quant Gold is positive, will cancel FOB and Rx TB empirically    Merton Border, MD ; [facility] service Mobile [phone].  After 5:30 PM or weekends, call [phone]

## 2013-01-13 NOTE — Progress Notes (Signed)
01/13/2013- Resp Care Note- Pt has had a non-productive cough since September as per patient.  Unable to obtain sputum from patient.  Collection cup left at bedside.  S Jeanna Giuffre rrt, rcp

## 2013-01-13 NOTE — Progress Notes (Signed)
Patient ID: [name], female   DOB: Oct 14, 1960, 53 y.o.   MRN: [id number]    Subjective:  Dyspnea unchanged moved to isolation   Objective:  Filed Vitals:   01/13/13 0300 01/13/13 0500 01/13/13 0600 01/13/13 0700  BP: 100/67 96/61 105/70 117/76  Pulse: 93 95 96 101  Temp:  98.3 F (36.8 C)    TempSrc:      Resp: 14 17 14 17   Height:      Weight:   163 lb 9.3 oz (74.2 kg)   SpO2: 96% 92% 95% 94%    Intake/Output from previous day:  Intake/Output Summary (Last 24 hours) at 01/13/13 0743 Last data filed at 01/13/13 0600  Gross per 24 hour  Intake   1580 ml  Output   1720 ml  Net   -140 ml    Physical Exam: Affect appropriate Obese white female  HEENT: normal Neck supple with no adenopathy JVP normal no bruits no thyromegaly Lungs clear with no wheezing and good diaphragmatic motion Heart:  S1/S2 no murmur, no rub, gallop or click PMI normal Abdomen: benighn, BS positve, no tenderness, no AAA  Subxiphoid pericardial drain no bruit.  No HSM or HJR Distal pulses intact with no bruits No edema Neuro non-focal Skin warm and dry No muscular weakness   Lab Results: Basic Metabolic Panel:  Recent Labs  01/12/13 0930 01/13/13 0241  NA 141 140  K 3.7 3.6*  CL 103 99  CO2 25 28  GLUCOSE 127* 96  BUN 12 13  CREATININE 0.83 0.78  CALCIUM 8.4 8.9   Liver Function Tests:  Recent Labs  01/12/13 0240  AST 16  ALT 29  ALKPHOS 81  BILITOT 0.7  PROT 5.7*  ALBUMIN 2.9*   CBC:  Recent Labs  01/12/13 0240 01/13/13 0241  WBC 7.7 8.5  HGB 12.9 14.0  HCT 38.7 42.3  MCV 91.1 92.0  PLT 215 227   Thyroid Function Tests:  Recent Labs  01/11/13 1720  TSH 4.070    Imaging: Dg Chest Port 1 View  01/13/2013   CLINICAL DATA:  Respiratory failure  EXAM: PORTABLE CHEST - 1 VIEW  COMPARISON:  01/12/2013  FINDINGS: Dense consolidation in the right upper lobe similar to the prior study. Right lower lobe infiltrate also unchanged. There is a right pleural  effusion.  Mild left lower lobe infiltrate and small effusion. Pericardial drain is noted. Negative for heart failure.  IMPRESSION: No significant change dense consolidation right upper lobe. There also is bibasilar atelectasis and small effusions.   Electronically Signed   By: Franchot Gallo M.D.   On: 01/13/2013 07:22   Portable Chest 1 View  01/12/2013   CLINICAL DATA:  Dyspnea and possible pneumonia  EXAM: PORTABLE CHEST - 1 VIEW  COMPARISON:  PA and lateral chest x-ray dated January 09, 2013 and a chest CT scan of the same day  FINDINGS: There has developed dense consolidation of the right upper lobe. The right costophrenic angle is blunted and there is obscuration of the right hemidiaphragm consistent with pleural effusion. There is likely collapse of the right middle lobe in addition to the right upper lobe. The left lung is well-expanded. The interstitial markings of the left lung are diffusely more prominent today. The cardiac silhouette is not enlarged.  IMPRESSION: The findings are worrisome for near total right upper lobe collapse as well as right middle lobe collapse and a right pleural effusion. This has progressed significantly since the previous study. There have  also developed progressively increased interstitial markings in the left lung consistent with interstitial edema or developing pneumonia.   Electronically Signed   By: David  Martinique   On: 01/12/2013 07:42    Cardiac Studies:  ECG:   NSR low voltage nonspecific ST/T wave changes    Telemetry:  NSR    Echo:    tamponade  Trivial effusion post tap  Medications:   . enoxaparin (LOVENOX) injection  40 mg Subcutaneous Q24H  . influenza vac split quadrivalent PF  0.5 mL Intramuscular Tomorrow-1000  . levofloxacin (LEVAQUIN) IV  750 mg Intravenous Q24H  . sodium chloride  3 mL Intravenous Q12H  . vancomycin  1,000 mg Intravenous Q12H     . sodium chloride Stopped (01/11/13 2200)    Assessment/Plan:  Pericardial  Effusion:  S/P drainage  No output last 24 hours   Will give 25ug fentanyl and pull drain this am [facility];s note indicated bloody effusion initial AFB negative  Pulm:  CXR with ? RUL/RML collapse  Still dyspnic  Dr Alva Garnet has concerns for TB She quit smoking in 1998 ID:  Not acting like infection  May benefit from thoracentesis  Will ask pulmonary to decide.  Continue antibiotics  IM following as well.    Jenkins Rouge 01/13/2013, 7:43 AM

## 2013-01-13 NOTE — Progress Notes (Signed)
ANTIBIOTIC CONSULT NOTE   Pharmacy Consult for Vancomycin and Levaquin Indication: pneumonia  No Known Allergies  Patient Measurements: Height: 5\' 6"  (167.6 cm) Weight: 163 lb 9.3 oz (74.2 kg) IBW/kg (Calculated) : 59.3 Adjusted Body Weight:   Vital Signs: Temp: 98.2 F (36.8 C) (01/13 0800) Temp src: Oral (01/13 0800) BP: 105/71 mmHg (01/13 1000) Pulse Rate: 135 (01/13 1100) Intake/Output from previous day: 01/12 0701 - 01/13 0700 In: 8756 [P.O.:800; I.V.:215; IV Piggyback:575] Out: 4332 [Urine:1700; Drains:20] Intake/Output from this shift: Total I/O In: 240 [I.V.:40; IV Piggyback:200] Out: 80 [Drains:80]  Labs:  Recent Labs  01/11/13 1720 01/12/13 0240 01/12/13 0930 01/13/13 0241  WBC 13.8* 7.7  --  8.5  HGB 14.6 12.9  --  14.0  PLT 270 215  --  227  CREATININE 0.72 0.85 0.83 0.78   Estimated Creatinine Clearance: 86.7 ml/min (by C-G formula based on Cr of 0.78). No results found for this basename: VANCOTROUGH, Corlis Leak, VANCORANDOM, GENTTROUGH, GENTPEAK, GENTRANDOM, TOBRATROUGH, TOBRAPEAK, TOBRARND, AMIKACINPEAK, AMIKACINTROU, AMIKACIN,  in the last 72 hours   Microbiology: Recent Results (from the past 720 hour(s))  BODY FLUID CULTURE     Status: None   Collection Time    01/11/13 12:10 PM      Result Value Range Status   Specimen Description PERICARDIAL FLUID   Final   Special Requests NONE   Final   Gram Stain     Final   Value: RARE WBC PRESENT,BOTH PMN AND MONONUCLEAR     NO ORGANISMS SEEN     Performed at [facility]     Performed at [facility]   Culture     Final   Value: NO GROWTH 1 DAY     Performed at [facility]   Report Status PENDING   Incomplete  GRAM STAIN     Status: None   Collection Time    01/11/13 12:10 PM      Result Value Range Status   Specimen Description PERICARDIAL FLUID   Final   Special Requests NONE   Final   Gram Stain     Final   Value: RARE WBC PRESENT,BOTH PMN AND MONONUCLEAR     NO  ORGANISMS SEEN   Report Status 01/11/2013 FINAL   Final  AFB CULTURE WITH SMEAR     Status: None   Collection Time    01/11/13 12:10 PM      Result Value Range Status   Specimen Description FLUID PERICARDIAL   Final   Special Requests ADD    Final   ACID FAST SMEAR     Final   Value: NO ACID FAST BACILLI SEEN     Performed at [facility]   Culture     Final   Value: CULTURE WILL BE EXAMINED FOR 6 WEEKS BEFORE ISSUING A FINAL REPORT     Performed at [facility]   Report Status PENDING   Incomplete  MRSA PCR SCREENING     Status: None   Collection Time    01/11/13  1:13 PM      Result Value Range Status   MRSA by PCR NEGATIVE  NEGATIVE Final   Comment:            The GeneXpert MRSA Assay (FDA     approved for NASAL specimens     only), is one component of a     comprehensive MRSA colonization     surveillance program. It is not  intended to diagnose MRSA     infection nor to guide or     monitor treatment for     MRSA infections.    Medical History: Past Medical History  Diagnosis Date  . Pneumonia    Assessment: 53yo female transferred from [facility] for pericardiocentesis, to continue Vancomycin and Zosyn for pneumonia.  Pericardial fluid cultures have been sent as well.  Per d/w Pharmacist at [facility], she was on 750mg  IV q12 with a subtherapeutic trough 1/11.  Dose was increased to 1000mg  IV q12, but appears was not given as it was not charted.  Patient is currently afebrile with wbc stable and normal at 8.5. Renal function at baseline at 0.8. Will check vancomycin level tomorrow morning. No dose adjustments to Levaquin are recommended.   ANA, CCP, RhF - negative  1/11 pericardial fluid - ngtd 1/12 strep pneumo/legionella - neg 1/12 Quantiferon gold - ip 1/11 AFB cx - no AFB seen - final results pending  Goal of Therapy:  Vancomycin trough level 15-20 mcg/ml  Plan:  1-  Vancomycin 1000mg  IV q12 2-  Levaquin 750 q24 hours 3-  F/U cx and watch renal  function 4-  Check vancomycin trough prior to am dose 1/14  Erin Hearing PharmD., BCPS Clinical Pharmacist Pager [phone] 01/13/2013 1:49 PM

## 2013-01-14 ENCOUNTER — Encounter (HOSPITAL_COMMUNITY)
Admission: RE | Disposition: A | Discharge status: Home / Self Care | Payer: Self-pay | Source: Other Acute Inpatient Hospital | Attending: Pulmonary Disease

## 2013-01-14 ENCOUNTER — Inpatient Hospital Stay (HOSPITAL_COMMUNITY): Payer: Medicaid Other

## 2013-01-14 ENCOUNTER — Encounter (HOSPITAL_COMMUNITY): Payer: Self-pay

## 2013-01-14 HISTORY — PX: VIDEO BRONCHOSCOPY: SHX5072

## 2013-01-14 LAB — QUANTIFERON TB GOLD ASSAY (BLOOD)
Interferon Gamma Release Assay: NEGATIVE
Mitogen value: >10 IU/mL
Quantiferon Nil Value: 0.03 IU/mL
TB AG VALUE: 0.02 [IU]/mL
TB ANTIGEN MINUS NIL VALUE: 0 [IU]/mL

## 2013-01-14 LAB — VANCOMYCIN, TROUGH: Vancomycin Tr: 9.8 ug/mL — ABNORMAL LOW (ref 10.0–20.0)

## 2013-01-14 SURGERY — VIDEO BRONCHOSCOPY WITHOUT FLUORO
Anesthesia: Moderate Sedation | Laterality: Bilateral

## 2013-01-14 MED ORDER — LIDOCAINE HCL 2 % EX GEL
CUTANEOUS | Status: DC | PRN
  Administered 2013-01-14: 1

## 2013-01-14 MED ORDER — FENTANYL CITRATE 0.05 MG/ML IJ SOLN
INTRAMUSCULAR | Status: AC
  Filled 2013-01-14: qty 4

## 2013-01-14 MED ORDER — MORPHINE SULFATE 2 MG/ML IJ SOLN
2.0000 mg | INTRAMUSCULAR | Status: DC | PRN
  Administered 2013-01-14 – 2013-01-16 (×5): 2 mg via INTRAVENOUS
  Filled 2013-01-14 (×7): qty 1

## 2013-01-14 MED ORDER — PHENYLEPHRINE HCL 0.25 % NA SOLN
NASAL | Status: DC | PRN
  Administered 2013-01-14: 2 via NASAL

## 2013-01-14 MED ORDER — LIDOCAINE HCL (PF) 1 % IJ SOLN
INTRAMUSCULAR | Status: DC | PRN
  Administered 2013-01-14: 6 mL

## 2013-01-14 MED ORDER — MIDAZOLAM HCL 5 MG/ML IJ SOLN
INTRAMUSCULAR | Status: AC
  Filled 2013-01-14: qty 2

## 2013-01-14 MED ORDER — FENTANYL CITRATE 0.05 MG/ML IJ SOLN
INTRAMUSCULAR | Status: DC | PRN
  Administered 2013-01-14: 50 ug via INTRAVENOUS

## 2013-01-14 MED ORDER — VANCOMYCIN HCL IN DEXTROSE 750-5 MG/150ML-% IV SOLN
750.0000 mg | Freq: Three times a day (TID) | INTRAVENOUS | Status: DC
  Administered 2013-01-14 – 2013-01-15 (×3): 750 mg via INTRAVENOUS
  Filled 2013-01-14 (×4): qty 150

## 2013-01-14 MED ORDER — MIDAZOLAM HCL 10 MG/2ML IJ SOLN
INTRAMUSCULAR | Status: DC | PRN
  Administered 2013-01-14: 2 mg via INTRAVENOUS

## 2013-01-14 NOTE — Op Note (Signed)
Indication:   RUL consolidation  Premedication:  None  Sedation:  Midaz 2 mg Fentany 25 mcg  Anesthesia: Lidocaine neb Topical lidocaine to nares 40 cc 1% lidocaine during procedure  Procedure: After adequate sedation and anesthesia, the bronchoscope was introduced via the L naris and advanced into the posterior pharynx. Further anesthesia was obtained with 1% lidocaine and the scope was advanced into the trachea. Complete airway anesthesia was achieved with 1% lidocaine and a thorough airway examination was performed. This revealed the following.  Findings:  Normal airway exam  Specimens:   Brushings, washings from RUL sent for GS, bact cx, AFB, fungal, pneumocystis, cytology  Post procedure evaluation:  The patient tolerated the procedure well except transient hypoxemia    Merton Border, MD;  PCCM service; Mobile [phone]

## 2013-01-14 NOTE — Progress Notes (Signed)
ANTIBIOTIC CONSULT NOTE - FOLLOW UP  Pharmacy Consult:  Vancomycin Indication:  PNA  No Known Allergies  Patient Measurements: Height: 5\' 6"  (167.6 cm) Weight: 163 lb 9.3 oz (74.2 kg) IBW/kg (Calculated) : 59.3  Vital Signs: Temp: 98 F (36.7 C) (01/14 0959) Temp src: Oral (01/14 0959) BP: 99/60 mmHg (01/14 0959) Pulse Rate: 103 (01/14 0959) Intake/Output from previous day: 01/13 0701 - 01/14 0700 In: 850 [P.O.:420; I.V.:80; IV Piggyback:350] Out: 80 [Drains:80]  Labs:  Recent Labs  01/11/13 1720 01/12/13 0240 01/12/13 0930 01/13/13 0241  WBC 13.8* 7.7  --  8.5  HGB 14.6 12.9  --  14.0  PLT 270 215  --  227  CREATININE 0.72 0.85 0.83 0.78   Estimated Creatinine Clearance: 86.7 ml/min (by C-G formula based on Cr of 0.78).  Recent Labs  01/14/13 0804  [place] 9.8*     Microbiology: Recent Results (from the past 720 hour(s))  BODY FLUID CULTURE     Status: None   Collection Time    01/11/13 12:10 PM      Result Value Range Status   Specimen Description PERICARDIAL FLUID   Final   Special Requests NONE   Final   Gram Stain     Final   Value: RARE WBC PRESENT,BOTH PMN AND MONONUCLEAR     NO ORGANISMS SEEN     Performed at [facility]     Performed at [facility]   Culture     Final   Value: NO GROWTH 2 DAYS     Performed at [facility]   Report Status PENDING   Incomplete  GRAM STAIN     Status: None   Collection Time    01/11/13 12:10 PM      Result Value Range Status   Specimen Description PERICARDIAL FLUID   Final   Special Requests NONE   Final   Gram Stain     Final   Value: RARE WBC PRESENT,BOTH PMN AND MONONUCLEAR     NO ORGANISMS SEEN   Report Status 01/11/2013 FINAL   Final  AFB CULTURE WITH SMEAR     Status: None   Collection Time    01/11/13 12:10 PM      Result Value Range Status   Specimen Description FLUID PERICARDIAL   Final   Special Requests ADD    Final   ACID FAST SMEAR     Final   Value: NO ACID  FAST BACILLI SEEN     Performed at [facility]   Culture     Final   Value: CULTURE WILL BE EXAMINED FOR 6 WEEKS BEFORE ISSUING A FINAL REPORT     Performed at [facility]   Report Status PENDING   Incomplete  MRSA PCR SCREENING     Status: None   Collection Time    01/11/13  1:13 PM      Result Value Range Status   MRSA by PCR NEGATIVE  NEGATIVE Final   Comment:            The GeneXpert MRSA Assay (FDA     approved for NASAL specimens     only), is one component of a     comprehensive MRSA colonization     surveillance program. It is not     intended to diagnose MRSA     infection nor to guide or     monitor treatment for     MRSA infections.  Assessment: 81 YOF admitted to [facility] with SOB, then transferred to [facility] on 01/11/13 for management of pericardial effusion and early tamponade.  Patient continues on vancomycin and levofloxacin for PNA.  Vancomycin trough remains sub-therapeutic.  Her renal function has been stable.  Zosyn 1/11 >> 1/12 LVQ 1/12 >> Vanc 1/9 [facility]) >>  VT = 8 (on 750 q12 >> inc to 1000mg  q12) 1/14 VT = 9.8 (on 1g q12 >> 750mg  q8h, SCr 0.73)  ANA, CCP, RhF - negative  1/11 pericardial fluid - NGTD 1/12 strep pneumo/legionella - negative 1/12 Quantiferon gold - ip  1/11 AFB cx - no AFB seen - final results pending   Goal of Therapy:  Vancomycin trough level 15-20 mcg/ml   Plan:  - Change vanc to 750mg  IV Q8H - LVQ 750mg  IV Q24H as ordered - Monitor renal fxn, clinical course, vanc trough at new Css - F/U KCL supplementation - F/U quantiferon TB gold    Tanette Chauca D. Mina Marble, PharmD, BCPS Pager:  [phone] 01/14/2013, 10:18 AM

## 2013-01-14 NOTE — Progress Notes (Signed)
Pt to be sent down for Bronchoscopy has been NPO since last night. Pt unable to give specimen for AFB.  Pt instructed to  Use I/S to be able to deeply cough and get some mucus spear for t est... Will continue to monitor . No c/o of pain at this time .

## 2013-01-14 NOTE — Progress Notes (Signed)
Patient Name: [name] Date of Encounter: 01/14/2013  Active Problems:   Pericardial tamponade   Pulmonary infiltrates   Multifocal lung consolidation    SUBJECTIVE: Breathing a little better than on admission, still with dry cough. Needs oxygen all the time. Pericardial tube site doing OK.   OBJECTIVE Filed Vitals:   01/13/13 1700 01/13/13 2200 01/14/13 0240 01/14/13 0533  BP: 125/81 116/72 104/75 113/74  Pulse: 117 125 96 94  Temp: 99.1 F (37.3 C) 99.2 F (37.3 C) 98.3 F (36.8 C) 98.7 F (37.1 C)  TempSrc: Oral Oral Oral Oral  Resp: 23 20 20 20   Height:      Weight:      SpO2: 94% 90% 96% 100%    Intake/Output Summary (Last 24 hours) at 01/14/13 1001 Last data filed at 01/13/13 1501  Gross per 24 hour  Intake    320 ml  Output      0 ml  Net    320 ml   Filed Weights   01/11/13 1300 01/12/13 0324 01/13/13 0600  Weight: 168 lb 14 oz (76.6 kg) 165 lb 9.1 oz (75.1 kg) 163 lb 9.3 oz (74.2 kg)   PHYSICAL EXAM General: Well developed, well nourished, female in no acute distress. Head: Normocephalic, atraumatic.  Neck: Supple without bruits, JVD not elevated. Lungs:  Resp regular and unlabored, dry rales noted. Heart: RRR, S1, S2, no S3, S4, or murmur; no rub. Abdomen: Soft, non-tender, non-distended, BS + x 4. Perciardial drain site without ecchymosis or hematoma Extremities: No clubbing, cyanosis, no edema.  Neuro: Alert and oriented X 3. Moves all extremities spontaneously. Psych: Normal affect.  LABS: CBC: Recent Labs  01/12/13 0240 01/13/13 0241  WBC 7.7 8.5  HGB 12.9 14.0  HCT 38.7 42.3  MCV 91.1 92.0  PLT 215 119   Basic Metabolic Panel: Recent Labs  01/12/13 0930 01/13/13 0241  NA 141 140  K 3.7 3.6*  CL 103 99  CO2 25 28  GLUCOSE 127* 96  BUN 12 13  CREATININE 0.83 0.78  CALCIUM 8.4 8.9   Liver Function Tests: Recent Labs  01/12/13 0240  AST 16  ALT 29  ALKPHOS 81  BILITOT 0.7  PROT 5.7*  ALBUMIN 2.9*   Thyroid  Function Tests: Recent Labs  01/11/13 1720  TSH 4.070    Fluid Type-FCT  PERICARDIAL   Comments: FLUID Color BLOODY (A)   Appearance BLOODY (A)   WBC, Fluid 0 - 1000 cu mm  4780 (H)   Neutrophil Count, Fluid 0 - 25 %  46 (H)   Lymphs, Fluid %  34   Monocyte-Macrophage-Serous Fluid 50 - 90 %  13 (L)   Eos, Fluid %   6   MICRO/PATH:  Pericardial 1/11 >> GS negative, NGTD >>  Pericardial AFB 1/11 >> smear negative  HIV serology 1/12 >> NEG  Strep Ag 1/12 >> NEG  Legionella Ag 1/12 >> NEG  Pericardial cytology 1/11 >> atypical cells, likely reactive mesothelial cells  Radiology/Studies: Dg Chest Port 1 View 01/13/2013   CLINICAL DATA:  Respiratory failure  EXAM: PORTABLE CHEST - 1 VIEW  COMPARISON:  01/12/2013  FINDINGS: Dense consolidation in the right upper lobe similar to the prior study. Right lower lobe infiltrate also unchanged. There is a right pleural effusion.  Mild left lower lobe infiltrate and small effusion. Pericardial drain is noted. Negative for heart failure.  IMPRESSION: No significant change dense consolidation right upper lobe. There also is bibasilar atelectasis  and small effusions.   Electronically Signed   By: Franchot Gallo M.D.   On: 01/13/2013 07:22     Current Medications:  . enoxaparin (LOVENOX) injection  40 mg Subcutaneous Q24H  . influenza vac split quadrivalent PF  0.5 mL Intramuscular Tomorrow-1000  . levofloxacin (LEVAQUIN) IV  750 mg Intravenous Q24H  . pantoprazole  40 mg Oral Daily  . sodium chloride  3 mL Intravenous Q12H  . vancomycin  1,000 mg Intravenous Q12H   . sodium chloride Stopped (01/11/13 2200)    ASSESSMENT AND PLAN: Active Problems:   Pericardial tamponade - See above for cell counts and labs back so far. Drain pulled yesterday, site OK. MD advise timing of echo recheck.     Pulmonary infiltrates - per CCM/IM    Multifocal lung consolidation - per CCM/IM  Pt has no other active cardiac issues. Primary problem seems  respirator, MD advise on transfer of care to another service.  Signed, Rosaria Ferries , PA-C 10:01 AM 01/14/2013  History and all data above reviewed.  Patient examined.  I agree with the findings as above.    The patient exam reveals COR:RRR, no rub,  Lungs: Decreased breath sounds bilateral  ,  Abd: Positive bowel sounds, no rebound no guarding, Ext Trace edema  .  All available labs, radiology testing, previous records reviewed. Agree with documented assessment and plan. Pericardial effusion.  Drain out yesterday.  No change in therapy.  Work up per pulmonary.  We will consider relook echo in the days to come pending other work up.   Jeneen Rinks [facility]  11:36 AM  01/14/2013

## 2013-01-14 NOTE — Progress Notes (Signed)
Video bronchoscopy procedure performed. Bronchial washings intervention performed. Brushings intervention performed.   Merton Border, MD ; [facility] [phone].  After 5:30 PM or weekends, call [phone]

## 2013-01-14 NOTE — Progress Notes (Signed)
PULMONARY/CCM NOTE  Requesting MD/Service: NIshan/Cards Date of admission: 1/11 Date of consult: 1/12 Reason for consultation: abnormal CXR  Pt Profile:  Previously healthy 53 yoF admitted to [facility] 1/09 with progressive dyspnea and transferred to [facility] (Cards Service) 1/11 for eval and mgmt of pericardial effusion and early tamponade. Underwent pericardial drain placement on admission with 700 cc's of bloody fluid removed. Repeat CXR 1/12 with worsening infiltrates. PCCM asked to eval     MICRO/PATH: Pericardial 1/11 >> GS negative, NGTD >>  Pericardial AFB 1/11 >> smear negative HIV serology 1/12 >> NEG Strep Ag 1/12 >> NEG Legionella Ag 1/12 >> NEG Pericardial cytology 1/11 >> atypical cells, likely reactive mesothelial cells  SUBJ: No new complaints. No overt distress  Filed Vitals:   01/14/13 1445 01/14/13 1450 01/14/13 1455 01/14/13 1500  BP: 111/83 112/85 113/79 156/90  Pulse: 105 111 108 115  Temp:      TempSrc:      Resp: 19 23 19 16   Height:      Weight:      SpO2: 97% 83% 93% 87%    EXAM:  Gen: WDWN in NAD HEENT: NCAT, EOMI, PERRL Neck: No JVD or LAN Lungs: Rales in RUL posteriorly, slight rhonchi anteriorly, no wheezes Cardiovascular: RRR s M Abdomen: Soft, NT, NABS Ext: no C/C/E Neuro: No focal deficits  DATA:  I have reviewed all of today's lab results. Relevant abnormalities are discussed in the A/P section  CXR: [place] consolidated RUL and R effusion  IMPRESSION:   Subacute dyspnea and cough since 09/2012 Extensive pulmonary infiltrates R>L with consolidation RML,RUL and reticulonodular changes throughout Large bloody pericardial effusion with tamponade - s/p drainage  DISC: Concern remains for malignancy vs chronic infectious such as TB/fungal Urine Ag's negative for strep and legionella Quant Gold assay not supportive of dx of TB Unable to produce any sputum  PLAN:  FOB today Cont vanc and Levofloxacin Follow CXR  intermittently    Merton Border, MD ; [facility] service Mobile [phone].  After 5:30 PM or weekends, call [phone]

## 2013-01-14 NOTE — Consult Note (Signed)
[facility]  Consult NOTE  [name] QQI:[id number] DOB: [birth year] DOA: 01/11/2013 PCP: No PCP Per Patient  Assessment/Plan: 1. Pericardial effusion with tamponade: drain removed.High WBCs in pericardial fluid, gram stain WBC's no organisms, culture no growth to date, high LDH, AFB negative 1/4, cytology with reactive mesothelial cells.  Rheum labs negative for RA/lupus other autoimmune disorder. 2. Pneumonia vs pulmonary malignancy: Chest stable with partial collapse right upper and middle lobes and right pleural effusion.  Continue broad spectrum antibiotics for possible infection though no increase in peripheral WBC's and only one measured high temp of 100.  Now on airborne precautions with quantiferon pending. Bronchoscopy today per pulmonary. 3. Overall picture most concerning for malignancy. Possible mesothelioma.  Would consider oncology consultation once bronchoscopy results are back.  Most of patient's current medical issues are pulmonary in nature. Patient is being followed and managed by pulmonary/critical care. Nothing further to add at this time. Will sign off.  Code Status: full Family Communication: spoke with patient at bedside Disposition Plan: per primary team  Procedures:  Pericardiocentesis 1/11  Imaging (reports/CDs from [place] not in EPIC)  CT chest 01/09/13: extensive multifocal infiltrates in the right middle and upper lobes with small right sided parapneumonic effusion. Many nonspecific pulmonary nodules in both right and left lungs fields representing hematogenous distribution of infection or metastatic disease. Moderate pericardial effusion.  Bronchoscopy : Dr. Alva Garnet 01/14/2013  Antibiotics: Vancomycin 1/9>>  Cefepime 1/9>>1/11 Zosyn 1/11>>1/12 levoquin 1/12>>  HPI/Subjective: [name] is a 53 y.o. female with no significant medical history who presented to presented to [facility] on 01/09/13 for cough and shortness of breath.  Cough was non productive, no fevers chills or hemoptysis. On presentation there was no fever and WBC normal. Chest xray showed extensive infiltrate of the right lung consistent with pneumonia and left perihilar infiltrate. CT showed extensive multifocal infiltrates in the right middle and upper lobes with small right sided parapneumonic effusion. Many nonspecific pulmonary nodules in both right and left lungs fields representing hematogenous distribution of infection or metastatic disease. Moderate pericardial effusion. She was transferred to [facility] for pericardiocentesis.  Today she is alert, oriented comfortable. No respiratory distress. More comfortable after removal of drain.  Objective: Filed Vitals:   01/14/13 1531  BP:   Pulse: 109  Temp:   Resp: 21    Intake/Output Summary (Last 24 hours) at 01/14/13 1628 Last data filed at 01/14/13 1200  Gross per 24 hour  Intake      0 ml  Output      0 ml  Net      0 ml   Filed Weights   01/11/13 1300 01/12/13 0324 01/13/13 0600  Weight: 76.6 kg (168 lb 14 oz) 75.1 kg (165 lb 9.1 oz) 74.2 kg (163 lb 9.3 oz)    Exam:   General:  Alert, calm, no distress  Cardiovascular: RRR no mrg  Respiratory: scattered wheezes, good air movement  Abdomen: BS+, soft, slightly tender to palpation diffusely  Musculoskeletal: no edema  Data Reviewed: Basic Metabolic Panel:  Recent Labs Lab 01/11/13 1720 01/12/13 0240 01/12/13 0930 01/13/13 0241  NA  --  142 141 140  K  --  3.8 3.7 3.6*  CL  --  103 103 99  CO2  --  27 25 28   GLUCOSE  --  104* 127* 96  BUN  --  12 12 13   CREATININE 0.72 0.85 0.83 0.78  CALCIUM  --  8.3* 8.4 8.9   Liver Function  Tests:  Recent Labs Lab 01/12/13 0240  AST 16  ALT 29  ALKPHOS 81  BILITOT 0.7  PROT 5.7*  ALBUMIN 2.9*   No results found for this basename: LIPASE, AMYLASE,  in the last 168 hours No results found for this basename: AMMONIA,  in the last 168 hours CBC:  Recent Labs Lab  01/11/13 1720 01/12/13 0240 01/13/13 0241  WBC 13.8* 7.7 8.5  HGB 14.6 12.9 14.0  HCT 43.6 38.7 42.3  MCV 92.4 91.1 92.0  PLT 270 215 227   Cardiac Enzymes: No results found for this basename: CKTOTAL, CKMB, CKMBINDEX, TROPONINI,  in the last 168 hours BNP (last 3 results) No results found for this basename: PROBNP,  in the last 8760 hours CBG: No results found for this basename: GLUCAP,  in the last 168 hours  Recent Results (from the past 240 hour(s))  BODY FLUID CULTURE     Status: None   Collection Time    01/11/13 12:10 PM      Result Value Range Status   Specimen Description PERICARDIAL FLUID   Final   Special Requests NONE   Final   Gram Stain     Final   Value: RARE WBC PRESENT,BOTH PMN AND MONONUCLEAR     NO ORGANISMS SEEN     Performed at [facility]     Performed at [facility]   Culture     Final   Value: NO GROWTH 3 DAYS     Performed at [facility]   Report Status PENDING   Incomplete  GRAM STAIN     Status: None   Collection Time    01/11/13 12:10 PM      Result Value Range Status   Specimen Description PERICARDIAL FLUID   Final   Special Requests NONE   Final   Gram Stain     Final   Value: RARE WBC PRESENT,BOTH PMN AND MONONUCLEAR     NO ORGANISMS SEEN   Report Status 01/11/2013 FINAL   Final  AFB CULTURE WITH SMEAR     Status: None   Collection Time    01/11/13 12:10 PM      Result Value Range Status   Specimen Description FLUID PERICARDIAL   Final   Special Requests ADD    Final   ACID FAST SMEAR     Final   Value: NO ACID FAST BACILLI SEEN     Performed at [facility]   Culture     Final   Value: CULTURE WILL BE EXAMINED FOR 6 WEEKS BEFORE ISSUING A FINAL REPORT     Performed at [facility]   Report Status PENDING   Incomplete  MRSA PCR SCREENING     Status: None   Collection Time    01/11/13  1:13 PM      Result Value Range Status   MRSA by PCR NEGATIVE  NEGATIVE Final   Comment:             The GeneXpert MRSA Assay (FDA     approved for NASAL specimens     only), is one component of a     comprehensive MRSA colonization     surveillance program. It is not     intended to diagnose MRSA     infection nor to guide or     monitor treatment for     MRSA infections.     Studies: Dg Chest Port 1 View  01/13/2013  CLINICAL DATA:  Respiratory failure  EXAM: PORTABLE CHEST - 1 VIEW  COMPARISON:  01/12/2013  FINDINGS: Dense consolidation in the right upper lobe similar to the prior study. Right lower lobe infiltrate also unchanged. There is a right pleural effusion.  Mild left lower lobe infiltrate and small effusion. Pericardial drain is noted. Negative for heart failure.  IMPRESSION: No significant change dense consolidation right upper lobe. There also is bibasilar atelectasis and small effusions.   Electronically Signed   By: Franchot Gallo M.D.   On: 01/13/2013 07:22    Scheduled Meds: . enoxaparin (LOVENOX) injection  40 mg Subcutaneous Q24H  . influenza vac split quadrivalent PF  0.5 mL Intramuscular Tomorrow-1000  . levofloxacin (LEVAQUIN) IV  750 mg Intravenous Q24H  . pantoprazole  40 mg Oral Daily  . sodium chloride  3 mL Intravenous Q12H  . vancomycin  750 mg Intravenous Q8H   Continuous Infusions: . sodium chloride Stopped (01/11/13 2200)    Active Problems:   Pericardial tamponade   Pulmonary infiltrates   Multifocal lung consolidation    Time spent: 36 minutes    Alvon Nygaard M.D. [facility] Pager [phone]. If 7PM-7AM, please contact night-coverage at [email or web address], password [facility] 01/14/2013, 4:28 PM  LOS: 3 days

## 2013-01-15 ENCOUNTER — Inpatient Hospital Stay (HOSPITAL_COMMUNITY): Payer: Medicaid Other

## 2013-01-15 ENCOUNTER — Encounter (HOSPITAL_COMMUNITY): Payer: Self-pay | Admitting: Pulmonary Disease

## 2013-01-15 DIAGNOSIS — J189 Pneumonia, unspecified organism: Secondary | ICD-10-CM

## 2013-01-15 DIAGNOSIS — J90 Pleural effusion, not elsewhere classified: Secondary | ICD-10-CM

## 2013-01-15 LAB — PNEUMOCYSTIS JIROVECI SMEAR BY DFA: Pneumocystis jiroveci Ag: NEGATIVE

## 2013-01-15 LAB — AFB STAIN: ACID FAST SMEAR: NONE SEEN

## 2013-01-15 LAB — BODY FLUID CULTURE: Culture: NO GROWTH

## 2013-01-15 LAB — FUNGAL STAIN: Fungal Smear: NONE SEEN

## 2013-01-15 MED ORDER — MORPHINE SULFATE 2 MG/ML IJ SOLN
2.0000 mg | Freq: Once | INTRAMUSCULAR | Status: AC
  Administered 2013-01-15: 2 mg via INTRAVENOUS

## 2013-01-15 MED ORDER — LEVOFLOXACIN 750 MG PO TABS
750.0000 mg | ORAL_TABLET | Freq: Every day | ORAL | Status: DC
  Administered 2013-01-15 – 2013-01-28 (×13): 750 mg via ORAL
  Filled 2013-01-15 (×14): qty 1

## 2013-01-15 NOTE — Progress Notes (Signed)
PULMONARY/CCM NOTE  Requesting MD/Service: NIshan/Cards Date of admission: 1/11 Date of consult: 1/12 Reason for consultation: abnormal CXR  Pt Profile:  Previously healthy 87 yoF admitted to [facility] 1/09 with progressive dyspnea and transferred to [facility] (Cards Service) 1/11 for eval and mgmt of pericardial effusion and early tamponade. Underwent pericardial drain placement on admission with 700 cc's of bloody fluid removed. Repeat CXR 1/12 with worsening infiltrates. PCCM asked to eval     MICRO/PATH: Pericardial 1/11 >> NEG Pericardial AFB 1/11 >> smear negative >>  HIV serology 1/12 >> NEG Strep Ag 1/12 >> NEG Legionella Ag 1/12 >> NEG Pericardial cytology 1/11 >> atypical cells, likely reactive mesothelial cells AFB from FOB 1/14 >> smear neg >>  Fungal from FOB 1/14 >> smear neg >>    SUBJ: No new complaints. No overt distress  Filed Vitals:   01/15/13 1058 01/15/13 1320 01/15/13 1500 01/15/13 1835  BP: 107/72 106/70  102/64  Pulse: 95 101  105  Temp: 98.2 F (36.8 C) 98.1 F (36.7 C)  98.2 F (36.8 C)  TempSrc: Oral Oral  Oral  Resp: 20 20  20   Height:      Weight:      SpO2: 97% 93% 93% 94%    EXAM:  Gen: WDWN in NAD HEENT: NCAT, EOMI, PERRL Neck: No JVD or LAN Lungs: few scattered rhonchi Cardiovascular: RRR s M Abdomen: Soft, NT, NABS Ext: no C/C/E Neuro: No focal deficits  DATA:  I have reviewed all of today's lab results. Relevant abnormalities are discussed in the A/P section  CXR: Improved aeration RUL  IMPRESSION:   Subacute dyspnea and cough since 09/2012 Extensive pulmonary infiltrates R>L with consolidation RML,RUL and reticulonodular changes throughout Large bloody pericardial effusion with tamponade - s/p drainage  DATA: Urine Ag's negative for strep and legionella Quant Gold assay negative AFB smears  PLAN:  D/C vanc Change Levofloxacin to PO and complete 10-14 days F/U cytology on brushings and washings from FOB  1/14 Likely DC to home 1/16 AM Will need ROV with CXR in 1-2 wks Would recommend repeat CT chest in 6 wks    Merton Border, MD ; [facility] service Mobile [phone].  After 5:30 PM or weekends, call [phone]

## 2013-01-15 NOTE — Progress Notes (Signed)
[place] Progress Note Patient Name: [name] DOB: 1960/09/16 MRN: [id number]  Date of Service  01/15/2013   HPI/Events of Note  Call from nurse reporting that patient is experiencing 9/10 chest pain.  The patient heard something pop earlier and now in pain.  She has experienced pain similar to this and had relief with 2 mg of morphine.   eICU Interventions  Plan: Nurse to give patient ibuprofen which is already ordered.  Additional dose of 2 mg morphine IV given.  Nurse to call back if pain unrelieved.   Intervention Category Intermediate Interventions: Pain - evaluation and management  DETERDING,ELIZABETH 01/15/2013, 11:35 PM

## 2013-01-15 NOTE — Progress Notes (Signed)
Patient Name: [name] Date of Encounter: 01/15/2013  Active Problems:   Pericardial tamponade   Pulmonary infiltrates   Multifocal lung consolidation    SUBJECTIVE: No significant improvement in respiratory status. Still requires oxygen. Still with dry cough. Had some problems with bronch yesterday, but doing OK.  OBJECTIVE Filed Vitals:   01/14/13 1700 01/14/13 2304 01/15/13 0100 01/15/13 0527  BP: 106/68 110/68 108/83 106/61  Pulse: 108 99 100 95  Temp: 98.5 F (36.9 C) 98.3 F (36.8 C) 98.3 F (36.8 C) 97.9 F (36.6 C)  TempSrc: Oral Oral Oral Oral  Resp: 20 16 16 16   Height:      Weight:      SpO2: 96% 97% 94% 96%    Intake/Output Summary (Last 24 hours) at 01/15/13 0750 Last data filed at 01/15/13 0059  Gross per 24 hour  Intake    630 ml  Output    650 ml  Net    -20 ml   Filed Weights   01/11/13 1300 01/12/13 0324 01/13/13 0600  Weight: 168 lb 14 oz (76.6 kg) 165 lb 9.1 oz (75.1 kg) 163 lb 9.3 oz (74.2 kg)    PHYSICAL EXAM General: Well developed, well nourished, female in mild respiratory distress. Head: Normocephalic, atraumatic.  Neck: Supple without bruits, JVD not elevated. Lungs:  Resp regular and unlabored, decreased BS bilaterally Heart: RRR, S1, S2, no S3, S4, or murmur; no rub. Abdomen: Soft, non-tender, non-distended, BS + x 4.  Extremities: No clubbing, cyanosis, no edema.  Neuro: Alert and oriented X 3. Moves all extremities spontaneously. Psych: Normal affect.  LABS: CBC: Recent Labs  01/13/13 0241  WBC 8.5  HGB 14.0  HCT 42.3  MCV 92.0  PLT 628   Basic Metabolic Panel: Recent Labs  01/12/13 0930 01/13/13 0241  NA 141 140  K 3.7 3.6*  CL 103 99  CO2 25 28  GLUCOSE 127* 96  BUN 12 13  CREATININE 0.83 0.78  CALCIUM 8.4 8.9   Fluid Type-FCT  PERICARDIAL    Comments: FLUID Color  BLOODY (A)    Appearance  BLOODY (A)    WBC, Fluid 0 - 1000 cu mm  4780 (H)    Neutrophil Count, Fluid 0 - 25 %  46 (H)      Lymphs, Fluid %  34    Monocyte-Macrophage-Serous Fluid 50 - 90 %  13 (L)    Eos, Fluid %  6    MICRO/PATH:  Pericardial 1/11 >> GS negative, NGTD >>  Pericardial AFB 1/11 >> smear negative  HIV serology 1/12 >> NEG  Strep Ag 1/12 >> NEG  Legionella Ag 1/12 >> NEG  Pericardial cytology 1/11 >> atypical cells, likely reactive mesothelial cells  From Bronchial Washings -  Pneumocystis jiroveci Ag - NEGATIVE Gram Stain  MODERATE WBC PRESENT,BOTH PMN AND MONONUCLEAR RARE SQUAMOUS EPITHELIAL CELLS PRESENT RARE GRAM POSITIVE COCCI IN PAIRS; no growth so far. Performed at [facility]  Fungal Culture: No yeast or fungal elements seen on stain, Cx in progress x 4 weeks Culture  PENDING   Report Status  PENDING   Legionella Antigen (DFA) - NEGATIVE; Culture in progress x 5 days (Cytology, AFB Cx)  Current Medications:  . enoxaparin (LOVENOX) injection  40 mg Subcutaneous Q24H  . influenza vac split quadrivalent PF  0.5 mL Intramuscular Tomorrow-1000  . levofloxacin (LEVAQUIN) IV  750 mg Intravenous Q24H  . pantoprazole  40 mg Oral Daily  . sodium chloride  3 mL Intravenous Q12H  . vancomycin  750 mg Intravenous Q8H   . sodium chloride Stopped (01/11/13 2200)    ASSESSMENT AND PLAN: Active Problems:   Pericardial tamponade - drain out, no rub now, f/u echo before d/c    Pulmonary infiltrates/Multifocal lung consolidation - mgt per pulm. S/p bronch yest. MD review data available so far and advise plan.   Signed, Rosaria Ferries , PA-C 7:50 AM 01/15/2013 History and all data above reviewed.  Patient examined.  I agree with the findings as above. Still not breathing at baseline.  The patient exam reveals COR:RRR  ,  Lungs: Decreased  ,  Abd: Positive bowel sounds, no rebound no guarding, Ext Trace edema  .  All available labs, radiology testing, previous records reviewed. Agree with documented assessment and plan. Status post bronch.  WBC but no other positive tests yet.   No further cardiovascular suggestions.  We will follow as needed.  Probably repeat echocardiogram early on as an outpatient.   Jeneen Rinks [facility]  10:47 AM  01/15/2013

## 2013-01-16 ENCOUNTER — Inpatient Hospital Stay (HOSPITAL_COMMUNITY): Payer: Medicaid Other

## 2013-01-16 DIAGNOSIS — R072 Precordial pain: Secondary | ICD-10-CM

## 2013-01-16 LAB — BASIC METABOLIC PANEL
BUN: 15 mg/dL (ref 6–23)
CALCIUM: 9.1 mg/dL (ref 8.4–10.5)
CHLORIDE: 104 meq/L (ref 96–112)
CO2: 25 meq/L (ref 19–32)
Creatinine, Ser: 0.78 mg/dL (ref 0.50–1.10)
GFR calc Af Amer: >90 mL/min (ref >90)
GFR calc non Af Amer: >90 mL/min (ref >90)
Glucose, Bld: 103 mg/dL — ABNORMAL HIGH (ref 70–99)
Potassium: 4.1 mEq/L (ref 3.7–5.3)
Sodium: 141 mEq/L (ref 137–147)

## 2013-01-16 MED ORDER — IBUPROFEN 600 MG PO TABS
600.0000 mg | ORAL_TABLET | Freq: Three times a day (TID) | ORAL | Status: DC
  Administered 2013-01-16 – 2013-01-19 (×8): 600 mg via ORAL
  Filled 2013-01-16 (×12): qty 1

## 2013-01-16 MED ORDER — MORPHINE SULFATE 2 MG/ML IJ SOLN
2.0000 mg | Freq: Once | INTRAMUSCULAR | Status: AC
  Administered 2013-01-16: 2 mg via INTRAVENOUS

## 2013-01-16 MED ORDER — PANTOPRAZOLE SODIUM 40 MG PO TBEC
40.0000 mg | DELAYED_RELEASE_TABLET | Freq: Every day | ORAL | Status: DC
  Administered 2013-01-16 – 2013-01-23 (×7): 40 mg via ORAL
  Filled 2013-01-16 (×7): qty 1

## 2013-01-16 MED ORDER — MORPHINE SULFATE 2 MG/ML IJ SOLN
2.0000 mg | INTRAMUSCULAR | Status: DC | PRN
  Administered 2013-01-16 – 2013-01-27 (×45): 2 mg via INTRAVENOUS
  Filled 2013-01-16 (×47): qty 1

## 2013-01-16 NOTE — Progress Notes (Signed)
Patient called RN into room c/o severe 9/10 chest pain. States she was lying in bed and felt something "pop." Now has sharp 9/10 left sided chest pain. EKG obtained. Rapid RN notified and came to assess patient. Dr. Jimmy Footman on call notified and ordered IV morphine 2mg  to be given along with 600mg  ibuprofen. After receiving meds patient says "pain is 6/10 unless she moves then it is still 9/10." Vitals have been stable last BP 102/74.  Additional order for IV morphine 2mg  obtained and given for a total of 4mg . RN passed on report to Adonis Huguenin, RN at shift change who is aware of situation.  Informed patient to notify RN if pain worsens or does not get better with the morphine. Patient resting with eyes shut.

## 2013-01-16 NOTE — Progress Notes (Signed)
Echo Lab  2D Echocardiogram completed.  Divide, Jennings 01/16/2013 11:28 AM

## 2013-01-16 NOTE — Progress Notes (Signed)
PULMONARY/CCM NOTE  Requesting MD/Service: NIshan/Cards Date of admission: 1/Kristen Date of consult: 1/12 Reason for consultation: abnormal CXR  Pt Profile:  Previously healthy [name] admitted to [facility] 1/09 with progressive dyspnea and transferred to [facility] (Cards Service) 1/Kristen for eval and mgmt of pericardial effusion and early tamponade. Underwent pericardial drain placement on admission with 700 cc's of bloody fluid removed. Repeat CXR 1/12 with worsening infiltrates. PCCM asked to eval   DATA/EVENTS: 1/09 Admitted to [facility] with dyspnea. CXR c/w PNA.  1/09 CT chest (@[facility] ): No evidence of pulmonary embolism. Constellation of findings most worrisome for extensive multi focal infection, most severely affecting the right middle and upper lobes with associated small right-sided parapneumonic effusion. There are innumerable nonspecific pulmonary nodules within the aerated portions of the right lung and contralateral left lung which may represent hematogenous distribution of infection, though note metastatic disease could have a similar appearance. As such, a follow chest CT in 4 to 6 weeks after treatment is recommended to ensure resolution. Moderate-sized pericardial effusion.  1/Kristen Echo ([place]): large pericardial effusion with early tamponade 1/Kristen Transferred to [facility], Cards service. Pericardial drain placed 1/Kristen Echo: EF 65-70%. Trivial residual pericardial effusion 1/12 Pulmonary consultation: Concern for malignant vs chronic infectious cause of pulmonary and cardiac findings 1/12 Echo:Compared with prior echo of 1/Kristen/15, No significant change in the small circumferential pericardial effusion. No evidence of tamponade 1/13 increased consolidation of RUL 1/14 Quantiferon gold assay negative. Urine Ag's for strep and legionella negative 1/14 FOB for brushings and washings RUL - normal airway exam  AFB smears negative  Cytology shows severe atypia thought secondary to inflammation. Malignancy  not definitively excluded  1/14 Transferred to PCCM service 1/15 CXR with improved aeration of RUL 1/15 and 1/16: recurrent severe anterior pleuritic type CP 1/16 CXR revealed recurrent collapse or consolidation of RUL 1/16 Echocardiogram >>  1/16 Cardiology to re-eval  MICRO/PATH: Pericardial 1/Kristen >> NEG Pericardial AFB 1/Kristen >> smear negative >>  HIV serology 1/12 >> NEG Strep Ag 1/12 >> NEG Legionella Ag 1/12 >> NEG Pericardial cytology 1/Kristen >> atypical cells,  reactive mesothelial cells AFB from FOB 1/14 >> smear neg >>  Fungal from FOB 1/14 >> smear neg >>    SUBJ: Had 9/10 chest pain during the night. Required MSO4 for pain control. Remains on O2(not on O2 at home). Looks ill.  Filed Vitals:   01/15/13 2355 01/16/13 0005 01/16/13 0150 01/16/13 0544  BP: 108/67 102/74 120/68 103/63  Pulse: 100 99 91 80  Temp:   98.1 F (36.7 C) 98.1 F (36.7 C)  TempSrc:   Oral Oral  Resp:   18 18  Height:      Weight:      SpO2: 97% 97% 96% 96%    EXAM:  Gen: WDWN in obvious pain HEENT: NCAT, EOMI, PERRL Neck: No JVD or LAN Lungs: few scattered rhonchi Cardiovascular: RRR, no M or rub, chest wall tender  Abdomen: Soft, NT, NABS Ext: no C/C/E Neuro: No focal deficits  DATA:   Recent Labs Lab 01/12/13 0930 01/13/13 0241 01/16/13 0437  NA 141 140 141  K 3.7 3.6* 4.1  CL 103 99 104  CO2 25 28 25   BUN 12 13 15   CREATININE 0.83 0.78 0.78  GLUCOSE 127* 96 103*    Recent Labs Lab 01/Kristen/15 1720 01/12/13 0240 01/13/13 0241  HGB 14.6 12.9 14.0  HCT 43.6 38.7 42.3  WBC 13.8* 7.7 8.5  PLT 270 215 227  CXR: Progressive consolidation in the right upper lobe with volume loss and infiltrate. Right lower lobe infiltrate unchanged. Small right effusion is suspected. Small left upper lobe nodules again noted.   IMPRESSION:   Subacute dyspnea and cough since 09/2012 Extensive pulmonary infiltrates R>L with consolidation RML,RUL and reticulonodular changes throughout  R>L Large bloody pericardial effusion with tamponade - s/p drainage Recurrent sharp CP 1/15 PM and 1/16 AM   PLAN:  Cont Levofloxacin, complete 10-14 days Scheduled NSAIDs X 72 hrs Repeat Echo - if pericardial effusion re-accumulating, will need pericardial window and biopsy Discussed with Dr Percival Spanish who will review Echo and see in F/U   Pt and friend updated in detail  Merton Border, MD ; [facility] service Mobile [phone].  After 5:30 PM or weekends, call [phone]

## 2013-01-16 NOTE — Progress Notes (Signed)
   SUBJECTIVE:   The patient has significant chest pain with palpation.  Still SOB   PHYSICAL EXAM Filed Vitals:   01/16/13 0150 01/16/13 0544 01/16/13 0900 01/16/13 1408  BP: 120/68 103/63 108/79 110/69  Pulse: 91 80 90 103  Temp: 98.1 F (36.7 C) 98.1 F (36.7 C) 97.6 F (36.4 C) 98.1 F (36.7 C)  TempSrc: Oral Oral Oral Oral  Resp: 18 18 18 18   Height:      Weight:      SpO2: 96% 96% 97% 95%   General:  Moderately uncomfortable Lungs:  Decreased breath sounds  Heart:  RRR, no rub Abdomen:  Positive bowel sounds, no rebound no guarding Extremities:  Mild diffuse edema Chest:  Tender to palpation.   LABS:  Results for orders placed during the hospital encounter of 01/11/13 (from the past 24 hour(s))  BASIC METABOLIC PANEL     Status: Abnormal   Collection Time    01/16/13  4:37 AM      Result Value Range   Sodium 141  137 - 147 mEq/L   Potassium 4.1  3.7 - 5.3 mEq/L   Chloride 104  96 - 112 mEq/L   CO2 25  19 - 32 mEq/L   Glucose, Bld 103 (*) 70 - 99 mg/dL   BUN 15  6 - 23 mg/dL   Creatinine, Ser 0.78  0.50 - 1.10 mg/dL   Calcium 9.1  8.4 - 10.5 mg/dL   GFR calc non Af Amer >90  >90 mL/min   GFR calc Af Amer >90  >90 mL/min    Intake/Output Summary (Last 24 hours) at 01/16/13 1627 Last data filed at 01/15/13 1838  Gross per 24 hour  Intake    240 ml  Output      0 ml  Net    240 ml    ECHO:    No evidence of effusion.  No wall motion abnormalities.  EF 60%.    ASSESSMENT AND PLAN:  PERICARDIAL EFFUSION:  No evidence of recurrent effusion.   CHEST PAIN:  EKG without acute ST T wave changes.  She does have low voltage.  She has an incomplete RBBB and possible LAE not seen on the previous EKG.  However, there is no objective evidence of an acute cardiac process as the etiology of her event.  Pain seems to be superficial and reproducible with palpation.    Minus Breeding 01/16/2013 4:27 PM

## 2013-01-17 ENCOUNTER — Inpatient Hospital Stay (HOSPITAL_COMMUNITY): Payer: Medicaid Other

## 2013-01-17 LAB — CULTURE, RESPIRATORY

## 2013-01-17 LAB — CULTURE, RESPIRATORY W GRAM STAIN

## 2013-01-17 NOTE — Progress Notes (Signed)
PULMONARY/CCM NOTE  Requesting MD/Service: NIshan/Cards Date of admission: 1/11 Date of consult: 1/12 Reason for consultation: abnormal CXR  Pt Profile:  Previously healthy 48 yoF admitted to [facility] 1/09 with progressive dyspnea and transferred to [facility] (Cards Service) 1/11 for eval and mgmt of pericardial effusion and early tamponade. Underwent pericardial drain placement on admission with 700 cc's of bloody fluid removed. Repeat CXR 1/12 with worsening infiltrates. PCCM asked to eval   DATA/EVENTS: 1/09 Admitted to [place] with dyspnea. CXR c/w PNA.  1/09 CT chest (@[facility] ): No evidence of pulmonary embolism. Constellation of findings most worrisome for extensive multi focal infection, most severely affecting the right middle and upper lobes with associated small right-sided parapneumonic effusion. There are innumerable nonspecific pulmonary nodules within the aerated portions of the right lung and contralateral left lung which may represent hematogenous distribution of infection, though note metastatic disease could have a similar appearance. As such, a follow chest CT in 4 to 6 weeks after treatment is recommended to ensure resolution. Moderate-sized pericardial effusion.  1/11 Echo ([place]): large pericardial effusion with early tamponade 1/11 Transferred to [facility], Cards service. Pericardial drain placed 1/11 Echo: EF 65-70%. Trivial residual pericardial effusion 1/12 Pulmonary consultation: Concern for malignant vs chronic infectious cause of pulmonary and cardiac findings 1/12 Echo:Compared with prior echo of 01/11/13, No significant change in the small circumferential pericardial effusion. No evidence of tamponade 1/13 increased consolidation of RUL 1/14 Quantiferon gold assay negative. Urine Ag's for strep and legionella negative 1/14 FOB for brushings and washings RUL - normal airway exam  AFB smears negative  Cytology shows severe atypia thought secondary to inflammation. Malignancy  not definitively excluded  1/14 Transferred to PCCM service 1/15 CXR with improved aeration of RUL 1/15 and 1/16: recurrent severe anterior pleuritic type CP 1/16 CXR revealed recurrent collapse or consolidation of RUL 1/16 Echocardiogram >> There was no pericardial effusion 1/16 Cardiology  re-eval > no cardiac problem  MICRO/PATH: Pericardial 1/11 >> NEG Pericardial AFB 1/11 >> smear negative >>  HIV serology 1/12 >> NEG Strep Ag 1/12 >> NEG Legionella Ag 1/12 >> NEG Pericardial cytology 1/11 >> atypical cells,  reactive mesothelial cells AFB from FOB 1/14 >> smear neg >>  Fungal from FOB 1/14 >> smear neg >>    SUBJ: Comfortable as long as not taking deep breath and when she does pain is on the L Ant midline and below L breast   Filed Vitals:   01/16/13 2257 01/17/13 0209 01/17/13 0900 01/17/13 1459  BP: 117/84 95/65 105/66 117/81  Pulse: 105 105 105 99  Temp: 98.2 F (36.8 C) 97.8 F (36.6 C) 97.5 F (36.4 C) 97.7 F (36.5 C)  TempSrc: Oral Oral Oral Oral  Resp:  18 18 18   Height:      Weight:      SpO2: 96% 95% 97% 94%    EXAM:  Gen: WDWN i  HEENT: NCAT, EOMI, PERRL Neck: No JVD or LAN Lungs: few scattered rhonchi Cardiovascular: RRR, no M or rub, chest wall tender  Abdomen: Soft, NT, NABS Ext: no C/C/E Neuro: No focal deficits  DATA:   Recent Labs Lab 01/12/13 0930 01/13/13 0241 01/16/13 0437  NA 141 140 141  K 3.7 3.6* 4.1  CL 103 99 104  CO2 25 28 25   BUN 12 13 15   CREATININE 0.83 0.78 0.78  GLUCOSE 127* 96 103*    Recent Labs Lab 01/11/13 1720 01/12/13 0240 01/13/13 0241  HGB 14.6 12.9 14.0  HCT  43.6 38.7 42.3  WBC 13.8* 7.7 8.5  PLT 270 215 227     CXR: 1/17 No change from prior study. Dense right upper lobe consolidation  persists.    IMPRESSION:   Subacute dyspnea and cough since 09/2012 Extensive pulmonary infiltrates R>L with consolidation RML,RUL and reticulonodular changes throughout R>L ? Migrating ?  Large bloody  pericardial effusion with tamponade - s/p drainage Recurrent sharp CP 1/15 PM and 1/16 AM> only with deep breath pm 1/17   PLAN:  Cont Levofloxacin started 1/12 with plans complete 10-14 days Scheduled NSAIDs for now    Christinia Gully, MD Pulmonary and [place] [phone] After 5:30 PM or weekends, call [phone]

## 2013-01-18 LAB — CREATININE, SERUM
Creatinine, Ser: 0.63 mg/dL (ref 0.50–1.10)
GFR calc Af Amer: >90 mL/min (ref >90)
GFR calc non Af Amer: >90 mL/min (ref >90)

## 2013-01-18 NOTE — Progress Notes (Signed)
Clinical Education officer, museum (CSW) contacted Development worker, community and left message to assist with patient applying for insurance. Charge RN printed Medicaid application to give to patient to start the process.   Blima Rich, [place] Weekend CSW [phone]

## 2013-01-18 NOTE — Progress Notes (Signed)
PULMONARY/CCM NOTE  Requesting MD/Service: NIshan/Cards Date of admission: 1/11 Date of consult: 1/12 Reason for consultation: abnormal CXR  Pt Profile:  Previously healthy 44 yoF admitted to [facility] 1/09 with progressive dyspnea and transferred to [facility] (Cards Service) 1/11 for eval and mgmt of pericardial effusion and early tamponade. Underwent pericardial drain placement on admission with 700 cc's of bloody fluid removed. Repeat CXR 1/12 with worsening infiltrates. PCCM asked to eval   DATA/EVENTS: 1/09 Admitted to [place] with dyspnea. CXR c/w PNA.  1/09 CT chest (@[facility] ): No evidence of pulmonary embolism. Constellation of findings most worrisome for extensive multi focal infection, most severely affecting the right middle and upper lobes with associated small right-sided parapneumonic effusion. There are innumerable nonspecific pulmonary nodules within the aerated portions of the right lung and contralateral left lung which may represent hematogenous distribution of infection, though note metastatic disease could have a similar appearance. n. Moderate-sized pericardial effusion.  1/11 Echo ([place]): large pericardial effusion with early tamponade 1/11 Transferred to [facility], Cards service. Pericardial drain placed 1/11 Echo: EF 65-70%. Trivial residual pericardial effusion 1/12 Pulmonary consultation: Concern for malignant vs chronic infectious cause of pulmonary and cardiac findings 1/12 Echo:Compared with prior echo of 01/11/13, No significant change in the small circumferential pericardial effusion. No evidence of tamponade 1/13 increased consolidation of RUL 1/14 Quantiferon gold assay negative. Urine Ag's for strep and legionella negative 1/14 FOB for brushings and washings RUL - normal airway exam  AFB smears negative  Cytology shows severe atypia thought secondary to inflammation. Malignancy not definitively excluded  1/14 Transferred to PCCM service 1/15 CXR with improved  aeration of RUL 1/15 and 1/16: recurrent severe anterior pleuritic type CP 1/16 CXR revealed recurrent collapse or consolidation of RUL 1/16 Echocardiogram >> There was no pericardial effusion 1/16 Cardiology  re-eval > no cardiac problem  MICRO/PATH: Pericardial 1/11 >> NEG Pericardial AFB 1/11 >> smear negative >>  HIV serology 1/12 >> NEG Strep Ag 1/12 >> NEG Legionella Ag 1/12 >> NEG Pericardial cytology 1/11 >> atypical cells,  reactive mesothelial cells AFB from FOB 1/14 >> smear neg >>  Fungal from FOB 1/14 >> smear neg >>    SUBJ: Comfortable on 2lpm NP as long as not taking deep breath  > when she does pain is on the L Ant midline and below L breast   Filed Vitals:   01/17/13 1841 01/17/13 2228 01/18/13 0203 01/18/13 0624  BP: 109/74 103/71 103/69 96/62  Pulse: 101 96 82 82  Temp: 98.2 F (36.8 C) 98.2 F (36.8 C) 98.3 F (36.8 C) 97.5 F (36.4 C)  TempSrc: Oral Oral Oral Oral  Resp: 18 18 20 20   Height:      Weight:      SpO2: 91% 92% 94% 99%    EXAM:  Gen: WDWN  HEENT: NCAT, EOMI, PERRL Neck: No JVD or LAN Lungs: few scattered rhonchi/ no bronchial changes RUL Cardiovascular: RRR, no M or rub, chest wall tender  Abdomen: Soft, NT, NABS Ext: no C/C/E Neuro: No focal deficits  DATA:   Recent Labs Lab 01/12/13 0930 01/13/13 0241 01/16/13 0437 01/18/13 0533  NA 141 140 141  --   K 3.7 3.6* 4.1  --   CL 103 99 104  --   CO2 25 28 25   --   BUN 12 13 15   --   CREATININE 0.83 0.78 0.78 0.63  GLUCOSE 127* 96 103*  --     Recent Labs Lab 01/11/13 1720  01/12/13 0240 01/13/13 0241  HGB 14.6 12.9 14.0  HCT 43.6 38.7 42.3  WBC 13.8* 7.7 8.5  PLT 270 215 227     CXR: 1/17 No change from prior study. Dense right upper lobe consolidation  Persists but this cxr is vastly different from cxr 01/09/13    IMPRESSION:   Subacute dyspnea and cough since 09/2012 Extensive pulmonary infiltrates R>L with consolidation RML,RUL and reticulonodular changes  throughout R>L ? Migrating ?  Large bloody pericardial effusion with tamponade - s/p drainage Recurrent sharp CP 1/15 PM and 1/16 AM> only with deep breath pm 1/17   PLAN:  Cont Levofloxacin started 1/12 with plans tp complete 10-14 days Scheduled NSAIDs for now  Consider trial of steroids vs vats bx      Christinia Gully, MD Pulmonary and [place] [phone] After 5:30 PM or weekends, call [phone]

## 2013-01-18 NOTE — Progress Notes (Signed)
Red rash observed on upper arms bilaterally. Pt denies itching. Rash appears slightly raised on left arm. Pt states she thinks it is from blood pressure cuff.

## 2013-01-19 MED ORDER — METHYLPREDNISOLONE SODIUM SUCC 40 MG IJ SOLR
40.0000 mg | Freq: Four times a day (QID) | INTRAMUSCULAR | Status: DC
  Administered 2013-01-19 – 2013-01-25 (×24): 40 mg via INTRAVENOUS
  Filled 2013-01-19 (×28): qty 1

## 2013-01-19 NOTE — Progress Notes (Signed)
PULMONARY/CCM NOTE  Requesting MD/Service: NIshan/Cards Date of admission: 1/11 Date of consult: 1/12 Reason for consultation: abnormal CXR  BRIEF PATIENT DESCRIPTION:  53 yo female presented to [facility] on 1/9 with dyspnea, and transferred to [facility] on 1/11 for management of pericardial effusion.  PCCM consulted to assist with respiratory management.  SIGNIFICANT EVENTS: 1/09 Admit to [facility] 1/11 Transfer to [facility], pericardial drain placed >> cytology negative 1/12 PCCM consulted 1/14 Transfer to PCCM service 1/15 Pleuritic chest pain  STUDIES: 1/09 CT chest >> multifocal pneumonia, small Rt pleural effusion, multiple pulmonary nodules, mod pericardial effusion 1/11 Echo [facility]) >> large pericardial effusion with early tamponade 1/11 Echo >> EF 65-70%. Trivial residual pericardial effusion 1/11 Labs >> ESR 2, ANCA negative, RF < 10, anti CCP < 2, ANA negative 1/12 Quantiferon gold >> negative 1/14 Bronchoscopy >> normal airway exam, cytology shows severe 2nd to inflammation 1/16 Echocardiogram >> There was no pericardial effusion  CULTURES: Pericardial AFB 1/11 >> smear negative >>  HIV serology 1/12 >> negative Strep Ag 1/12 >> negative Legionella Ag 1/12 >> negative AFB from FOB 1/14 >> smear neg >>  Fungal from FOB 1/14 >> smear neg >>   ANTIBIOTICS: Vancomycin 1/11 >> 1/15 Zosyn 1/11 >> 1/11 Levaquin 1/11 >>   SUBJECTIVE: Winded with minimal activity.  C/o pleuritic type chest pain along Lt breast line.  OBJECTIVE: BP 93/63  Pulse 84  Temp(Src) 97.8 F (36.6 C) (Oral)  Resp 20  Ht 5' 6"  (1.676 m)  Wt 163 lb 9.3 oz (74.2 kg)  BMI 26.42 kg/m2  SpO2 92% 2 liters  EXAM:  Gen: appears fatigued HEENT: no sinus tenderness Lungs: scattered rhonchi Rt > Lt Cardiovascular: regular, no murmur Chest: tender to palpation along Lt chest Abdomen: Soft, non tender Ext: no edema Neuro: Normal strength Skin: no rashes  DATA:  CBC No results found for this basename: WBC, HGB,  HCT, PLT,  in the last 72 hours  BMET Recent Labs     01/18/13  0533  CREATININE  0.63   Imaging No results found.   ASSESSMENT/PLAN:  A: Rt upper lung pneumonitis with multiple pulmonary nodules >> infection vs malignancy. P: -f/u CXR 1/20 -continue levaquin, day 11 of Abx -may need lung tissue sampling if no further improvement  A: Pericardial effusion s/p drain. P: -monitor clinically  A: Pleuritic chest pain. P: -d/c advil -give trial of steroids >> will use IV solumedrol for now  A: Acute hypoxic respiratory failure P: -oxygen as needed to keep SpO2 > 92%  Updated family at bedside.  Chesley Mires, MD [facility] Pulmonary/Critical Care 01/19/2013, 12:23 PM Pager:  [phone] After 3pm call: [phone]

## 2013-01-20 ENCOUNTER — Inpatient Hospital Stay (HOSPITAL_COMMUNITY): Payer: Medicaid Other

## 2013-01-20 LAB — BASIC METABOLIC PANEL
BUN: 16 mg/dL (ref 6–23)
CO2: 24 meq/L (ref 19–32)
CREATININE: 0.6 mg/dL (ref 0.50–1.10)
Calcium: 9.1 mg/dL (ref 8.4–10.5)
Chloride: 104 mEq/L (ref 96–112)
GFR calc Af Amer: >90 mL/min (ref >90)
GFR calc non Af Amer: >90 mL/min (ref >90)
Glucose, Bld: 144 mg/dL — ABNORMAL HIGH (ref 70–99)
Potassium: 4.2 mEq/L (ref 3.7–5.3)
Sodium: 141 mEq/L (ref 137–147)

## 2013-01-20 LAB — LEGIONELLA PROFILE(CULTURE+DFA/SMEAR): Legionella Antigen (DFA): NEGATIVE

## 2013-01-20 LAB — CBC
HCT: 40.1 % (ref 36.0–46.0)
Hemoglobin: 13.8 g/dL (ref 12.0–15.0)
MCH: 30.5 pg (ref 26.0–34.0)
MCHC: 34.4 g/dL (ref 30.0–36.0)
MCV: 88.7 fL (ref 78.0–100.0)
Platelets: 261 10*3/uL (ref 150–400)
RBC: 4.52 MIL/uL (ref 3.87–5.11)
RDW: 12.9 % (ref 11.5–15.5)
WBC: 8.9 10*3/uL (ref 4.0–10.5)

## 2013-01-20 NOTE — Progress Notes (Signed)
Patient still has pain in lower sternum with deep breath. No evidence of recurrent tamponade. Pericardial drain removed on 01/13/13. Repeat Echo on 01/16/13 showed no effusion. Would recommend a limited follow up Echo at one to two weeks to rule out recurrent effusion. If patient still in hospital this can be done Friday-- otherwise we can arrange as outpatient.  Vernon Maish Martinique MD, [facility]  01/20/2013 11:14 AM

## 2013-01-20 NOTE — Clinical Social Work Note (Signed)
CSW received a call from RN regarding pt's daughter having financial questions regarding medical insurance. CSW informed RN that CSW would refer pt's daughters concerns and questions to financial counseling at [facility]. RN to inform pt's daughter that someone from financial counseling will be in touch with her by either phone or bedside visit. CSW contacted financial counseling regarding information above. Financial counselor stated that she will be contacting pt's daughter via phone at bedside. CSW signing off. Please re consult if CSW needed.  Pati Gallo, [place] Social Worker [phone]

## 2013-01-20 NOTE — Progress Notes (Signed)
PULMONARY/CCM NOTE  Requesting MD/Service: NIshan/Cards Date of admission: 1/11 Date of consult: 1/12 Reason for consultation: abnormal CXR  BRIEF PATIENT DESCRIPTION:  53 yo female presented to [facility] on 1/9 with dyspnea, and transferred to [facility] on 1/11 for management of pericardial effusion.  PCCM consulted to assist with respiratory management.  SIGNIFICANT EVENTS: 1/09 Admit to [facility] 1/11 Transfer to [facility], pericardial drain placed >> cytology negative 1/12 PCCM consulted 1/14 Transfer to PCCM service 1/15 Pleuritic chest pain 1/19 Added solumedrol  STUDIES: 1/09 CT chest >> multifocal pneumonia, small Rt pleural effusion, multiple pulmonary nodules, mod pericardial effusion 1/11 Echo [facility]) >> large pericardial effusion with early tamponade 1/11 Echo >> EF 65-70%. Trivial residual pericardial effusion 1/11 Labs >> ESR 2, ANCA negative, RF < 10, anti CCP < 2, ANA negative 1/12 Quantiferon gold >> negative 1/14 Bronchoscopy >> normal airway exam, cytology shows severe 2nd to inflammation 1/16 Echocardiogram >> There was no pericardial effusion  CULTURES: Pericardial AFB 1/11 >> smear negative >>  HIV serology 1/12 >> negative Strep Ag 1/12 >> negative Legionella Ag 1/12 >> negative AFB from FOB 1/14 >> smear neg >>  Fungal from FOB 1/14 >> smear neg >>   ANTIBIOTICS: Vancomycin 1/11 >> 1/15 Zosyn 1/11 >> 1/11 Levaquin 1/11 >>   SUBJECTIVE: Breathing easier. Still has pain with cough, but better at rest >> rated as 4/10 (was 10/10 on 1/19).  OBJECTIVE: BP 110/71  Pulse 89  Temp(Src) 97.8 F (36.6 C) (Oral)  Resp 18  Ht 5' 6"  (1.676 m)  Wt 163 lb 9.3 oz (74.2 kg)  BMI 26.42 kg/m2  SpO2 96% Room air  EXAM:  Gen: no distress HEENT: no sinus tenderness Lungs: scattered rhonchi, no wheeze Cardiovascular: regular, no murmur Chest: improved tenderness on Lt chest Abdomen: Soft, non tender Ext: no edema Neuro: Normal strength Skin: no rashes  DATA:  CBC Recent  Labs     01/20/13  0615  WBC  8.9  HGB  13.8  HCT  40.1  PLT  261    BMET Recent Labs     01/18/13  0533  01/20/13  0615  NA   --   141  K   --   4.2  CL   --   104  CO2   --   24  BUN   --   16  CREATININE  0.63  0.60  GLUCOSE   --   144*   Imaging Dg Chest 2 View  01/20/2013   CLINICAL DATA:  Follow-up pneumonia  EXAM: CHEST  2 VIEW  COMPARISON:  01/17/2013  FINDINGS: Patchy right upper lobe opacity, suspicious for pneumonia. When compared to the prior study, associated lobar consolidation has mildly improved. No pleural effusion or pneumothorax.  The heart is normal in size.  Mild degenerative changes of the visualized thoracolumbar spine.  IMPRESSION: Right upper lobe pneumonia, mildly improved.   Electronically Signed   By: Julian Hy M.D.   On: 01/20/2013 08:00     ASSESSMENT/PLAN:  A: Rt upper lung pneumonitis with multiple pulmonary nodules >> infection (more likely) vs malignancy. P: -f/u CXR intermittently -continue levaquin, day 12 of Abx -may need lung tissue sampling if no further improvement  A: Pericardial effusion s/p drain. P: -plan to f/u limited Echo on 1/23  A: Pleuritic chest pain. P: -continue IV solumedrol for now  A: Acute hypoxic respiratory failure P: -oxygen as needed to keep SpO2 > 92%  Chesley Mires, MD [place]  01/20/2013, 1:35 PM Pager:  [phone] After 3pm call: [phone]

## 2013-01-21 ENCOUNTER — Inpatient Hospital Stay (HOSPITAL_COMMUNITY): Payer: Medicaid Other

## 2013-01-21 DIAGNOSIS — R0781 Pleurodynia: Secondary | ICD-10-CM | POA: Diagnosis not present

## 2013-01-21 DIAGNOSIS — R071 Chest pain on breathing: Secondary | ICD-10-CM

## 2013-01-21 MED ORDER — HYDROCOD POLST-CHLORPHEN POLST 10-8 MG/5ML PO LQCR
5.0000 mL | Freq: Two times a day (BID) | ORAL | Status: DC | PRN
  Administered 2013-01-21 – 2013-01-26 (×6): 5 mL via ORAL
  Filled 2013-01-21 (×6): qty 5

## 2013-01-21 MED ORDER — IOHEXOL 300 MG/ML  SOLN
80.0000 mL | Freq: Once | INTRAMUSCULAR | Status: AC | PRN
  Administered 2013-01-21: 80 mL via INTRAVENOUS

## 2013-01-21 NOTE — Significant Event (Signed)
IV team unable to place large bore IV for CT angiogram chest.  Will change to CT chest with IV contrast.  Depending on results may also need V/Q scan.  Chesley Mires, MD [facility] Pulmonary/Critical Care 01/21/2013, 11:52 AM Pager:  [phone] After 3pm call: [phone]

## 2013-01-21 NOTE — Progress Notes (Signed)
PULMONARY/CCM NOTE  Requesting MD/Service: Nishan/Cards Date of admission: 1/11 Date of consult: 1/12 Reason for consultation: abnormal CXR  BRIEF PATIENT DESCRIPTION:  53 yo female presented to [facility] on 1/9 with dyspnea, and transferred to [facility] on 1/11 for management of pericardial effusion.  PCCM consulted to assist with respiratory management.  SIGNIFICANT EVENTS: 1/09 Admit to [facility] 1/11 Transfer to [facility], pericardial drain placed >> cytology negative 1/12 PCCM consulted 1/13 pericardial drain removed 1/14 Transfer to PCCM service 1/15 Pleuritic chest pain 1/19 Added solumedrol  STUDIES: 1/09 CT chest >> multifocal pneumonia, small Rt pleural effusion, multiple pulmonary nodules, mod pericardial effusion 1/11 Echo [facility]) >> large pericardial effusion with early tamponade 1/11 Echo >> EF 65-70%. Trivial residual pericardial effusion 1/11 Labs >> ESR 2, ANCA negative, RF < 10, anti CCP < 2, ANA negative 1/12 Quantiferon gold >> negative 1/14 Bronchoscopy >> normal airway exam, cytology shows severe 2nd to inflammation 1/16 Echocardiogram >> There was no pericardial effusion  CULTURES: Pericardial AFB 1/11 >> smear negative >>  HIV serology 1/12 >> negative Strep Ag 1/12 >> negative Legionella Ag 1/12 >> negative PCP 1/14>>> Sputum from FOB 1/14>>>NEG  Legionella from FOB 1/14>>> NEG  AFB from FOB 1/14 >> smear neg >>  Fungal from FOB 1/14 >> smear neg >>   ANTIBIOTICS: Vancomycin 1/11 >> 1/15 Zosyn 1/11 >> 1/11 Levaquin 1/11 >>   SUBJECTIVE: Still c/o pain in her chest >> worse with coughing, but present otherwise.  OBJECTIVE: BP 102/70  Pulse 76  Temp(Src) 98.3 F (36.8 C) (Oral)  Resp 18  Ht _0  (1.676 m)  Wt 163 lb 9.3 oz (74.2 kg)  BMI 26.42 kg/m2  SpO2 92% Room air  EXAM:  Gen: no distress HEENT: no sinus tenderness Lungs: few scattered rhonchi, no audible wheeze, diminished bases  Cardiovascular: regular, no murmur Chest: tenderness on Lt  chest Abdomen: Soft, non tender Ext: no edema Neuro: Normal strength Skin: no rashes  DATA:  CBC Recent Labs     01/20/13  0615  WBC  8.9  HGB  13.8  HCT  40.1  PLT  261    BMET Recent Labs     01/20/13  0615  NA  141  K  4.2  CL  104  CO2  24  BUN  16  CREATININE  0.60  GLUCOSE  144*   Imaging Dg Chest 2 View  01/20/2013   CLINICAL DATA:  Follow-up pneumonia  EXAM: CHEST  2 VIEW  COMPARISON:  01/17/2013  FINDINGS: Patchy right upper lobe opacity, suspicious for pneumonia. When compared to the prior study, associated lobar consolidation has mildly improved. No pleural effusion or pneumothorax.  The heart is normal in size.  Mild degenerative changes of the visualized thoracolumbar spine.  IMPRESSION: Right upper lobe pneumonia, mildly improved.   Electronically Signed   By: Julian Hy M.D.   On: 01/20/2013 08:00     ASSESSMENT/PLAN:  A: Rt upper lung pneumonitis with multiple pulmonary nodules >> infection (more likely) vs malignancy. P: -f/u CT chest 1/21 -continue levaquin, day 13 of Abx  A: Pericardial effusion s/p drain. P: -plan to f/u limited Echo on 1/23  A: Pleuritic chest pain. P: -continue IV solumedrol for now - taper over next few days - will hold taper for now with slightly increased pain/cough -f/u CT chest  A: Acute hypoxic respiratory failure P: -oxygen as needed to keep SpO2 > 92%   WHITEHEART,KATHRYN, NP 01/21/2013  8:54 AM Pager: [phone]  or [phone]  *Care during the described time interval was provided by me and/or other providers on the critical care team. I have reviewed this patient's available data, including medical history, events of note, physical examination and test results as part of my evaluation.  Reviewed above, examined pt, and agree with assessment/plan.  Will repeat CT angiogram chest to further assess status of pneumonitis, pulmonary nodules, and determine whether there is additional cause for  her pleuritic chest pain.    Chesley Mires, MD [facility] Pulmonary/Critical Care 01/21/2013, 9:26 AM Pager:  [phone] After 3pm call: [phone]

## 2013-01-22 ENCOUNTER — Inpatient Hospital Stay (HOSPITAL_COMMUNITY): Payer: Medicaid Other

## 2013-01-22 DIAGNOSIS — D381 Neoplasm of uncertain behavior of trachea, bronchus and lung: Secondary | ICD-10-CM

## 2013-01-22 MED ORDER — PROMETHAZINE HCL 25 MG PO TABS
12.5000 mg | ORAL_TABLET | Freq: Four times a day (QID) | ORAL | Status: DC | PRN
  Administered 2013-01-23 – 2013-01-24 (×3): 12.5 mg via ORAL
  Filled 2013-01-22 (×3): qty 1

## 2013-01-22 NOTE — Consult Note (Signed)
Subjective:   Patient is a 53 y.o. female who presented to [facility] on 01/09/2013 with complaints of dyspnea which has progressed over the past 3 weeks.  This is associated with a non productive cough but patient denied fevers, chills, and hemoptysis.  Workup in the ED revealed a moderate sized pericardial effusion, extensive right lung consolidation and a right sided parapneumonia effusion.  She was admitted for further care.  She underwent TEE by Dr. Fletcher Anon which showed a large pericardial effusion with evidence of cardiac tamponade.  This resulted in immediate transfer to [facility] for intervention.  Upon arrival patient was taken to the catheterization lab by Dr. Burt Knack who performed a fluoroscopic guided Pericardiocentesis which removed 700 cc of blood tinged fluid.  This fluid was sent for cultures, gram stain, AFB, and cytology.    The patient was admitted by the medicine service and further work up was begun.  She was placed on ABX for coverage of pneumonia.  CT Scan from [facility] was reviewed and showed multifocal infiltrates in the right middle and upper lobes with a small right sided parapneumonic effusion.  There were also nonspecific pulmonary nodules in both right and left lung fields felt to be infection or metastatic disease.  The patient continued to have worsening lung consolidation per CXR and Pulmonary/CCM was consulted.  The patient underwent blood work which did not indicate TB.  Due to worsening pulmonary status, patient underwent Bronchoscopy which revealed a normal airway exam.  Bronchial washings were obtained and cent for culture, and cytology.  She was transferred to Pulmonology service.    The patient's respiratory status had improved, however she redeveloped chest pain on 1/15 and 1/16.  Repeat CXR obtained showed recurrent collapse and consolidations of RUL.  Cardiology was re consulted and repeat Echocardiogram was performed and did not show evidence of  recurrent pericardial effusion.  The patient was treated with continue ABX.  She was placed on scheduled NSAIDS to treat pleuritic chest pain.  These were eventually discontinued and steroids were started.  Patient continued to have respiratory problems.  Repeat CT scan was obtained and showed significant changes from the previous study obtained 01/09/2013.  There was a new mass like area the medial aspect of the right upper lobe measuring 3.6 x 2.2 cm.  There was also evidence of extensive bronchial wall thickening, thickening of the peribronchovascular interstitium, and innumerable peribronchovascular micro and macronodules scattered throughout the lungs bilaterally.  There were also lytic lesions present along the spinal column.  Due to this finding, there was an increased concern for malignancy.  Thoracic surgery has been consulted for biopsy of the lung mass.     The patient states she is doing okay.  She continues to have shortness of breath and non-productive dry cough.  She denies night sweats, fevers, chills, hemoptysis, or weight loss over the past several months.  She was a previous smoker, but quit back in 1998.           Patient Active Problem List   Diagnosis Date Noted  . Pleuritic chest pain 01/21/2013  . Pulmonary infiltrates 01/12/2013  . Multifocal lung consolidation 01/12/2013  . Pericardial tamponade 01/11/2013   Past Medical History  Diagnosis Date  . Pneumonia     Past Surgical History  Procedure Laterality Date  . Video bronchoscopy Bilateral 01/14/2013    Procedure: VIDEO BRONCHOSCOPY WITHOUT FLUORO;  Surgeon: Wilhelmina Mcardle, MD;  Location: [facility] ENDOSCOPY;  Service: Cardiopulmonary;  Laterality: Bilateral;  Prescriptions prior to admission  Medication Sig Dispense Refill  . Biotin 5000 MCG TABS Take 10,000 mcg by mouth daily.      . diphenhydrAMINE (BENADRYL) 25 mg capsule Take 50 mg by mouth daily as needed for allergies.      Marland Kitchen ibuprofen (ADVIL,MOTRIN) 200 MG tablet  Take 400-600 mg by mouth daily as needed for mild pain.      Marland Kitchen omeprazole (PRILOSEC) 20 MG capsule Take 20 mg by mouth daily.      . vitamin B-12 (CYANOCOBALAMIN) 1000 MCG tablet Take 1,000 mcg by mouth daily.       No Known Allergies  History  Substance Use Topics  . Smoking status: Former Research scientist (life sciences)  . Smokeless tobacco: Never Used  . Alcohol Use: Yes     Comment: occasionally has a drink    Family History  Problem Relation Age of Onset  . Coronary artery disease Mother 19    Review of Systems Pertinent items are noted in HPI.  Objective:   Patient Vitals for the past 8 hrs:  BP Temp Temp src Pulse Resp SpO2  01/22/13 1341 110/64 mmHg 97.9 F (36.6 C) Oral 90 20 94 %  01/22/13 0940 119/64 mmHg 98.1 F (36.7 C) Oral 101 20 94 %   BP 110/64  Pulse 90  Temp(Src) 97.9 F (36.6 C) (Oral)  Resp 20  Ht 5\' 6"  (1.676 m)  Wt 163 lb 9.3 oz (74.2 kg)  BMI 26.42 kg/m2  SpO2 94% General appearance: alert, cooperative and no distress Head: Normocephalic, without obvious abnormality, atraumatic Eyes: conjunctivae/corneas clear. PERRL, EOM's intact. Fundi benign. Lungs: rhonchi scattered Heart: regular rate and rhythm Abdomen: soft, non-tender; bowel sounds normal; no masses,  no organomegaly Extremities: extremities normal, atraumatic, no cyanosis or edema Skin: Skin color, texture, turgor normal. No rashes or lesions Neurologic: Grossly normal  Data Review: Radiology review: CT Scan:  1. Highly unusual appearance of the a lungs, particularly when  compared to prior study 01/09/2013. Although the right upper lobe  pneumonia on the prior study has slightly improved on today's  examination, although there continues to be considerable airspace  consolidation throughout the right upper lobe. In addition, today's  demonstrates a mass like area in the medial aspect of the right  upper lobe (image 16 of series 2) measuring 3.6 x 2.2 cm, which  could represent a neoplasm. There are also  potential lytic lesions  in the C6 vertebral body and the left sixth rib the anterolaterally.  Although the findings in the lungs may simply all be part of an  underlying atypical infectious process such as a fungal pneumonia,  the possibility of neoplasm with a combination of lymphangitic  spread of disease and hematogenous spread of disease throughout the  lungs (in addition to superimposed infection) warrants  consideration.  2. Small right pleural effusion is simple in appearance has and has  decreased slightly compared to the prior study.  3. Near complete resolution of the previously noted pericardial  effusion.  4. Small hiatal hernia.   patient examined and medical record reviewed,agree with above note. VAN TRIGT III,Krystelle Prashad 01/22/2013   A/P:  1. Right Upper infiltrate/density  I have reviewed her CT scans and examined the patient. Middle-aged female with remote history of smoking presented initially with pericarditis and right upper lobe pneumonitis. Although she does not appear toxic overall picture appears infectious. Concern over malignancy with nodular density in medial aspect of right upper lobe could  be addressed with biopsy,  however I would recommend asking IR consider CT-guided transthoracic needle biopsy first before thoracotomy. Her last chest x-ray appears somewhat better   - I would hold off on surgery at this time .

## 2013-01-22 NOTE — Progress Notes (Signed)
PULMONARY/CCM NOTE  Requesting MD/Service: Nishan/Cards Date of admission: 1/11 Date of consult: 1/12 Reason for consultation: abnormal CXR  BRIEF PATIENT DESCRIPTION:  53 yo female presented to [facility] on 1/9 with dyspnea, and transferred to [facility] on 1/11 for management of pericardial effusion.  PCCM consulted to assist with respiratory management.  SIGNIFICANT EVENTS: 1/09 Admit to [facility] 1/11 Transfer to [facility], pericardial drain placed >> cytology negative 1/12 PCCM consulted 1/13 pericardial drain removed 1/14 Transfer to PCCM service 1/15 Pleuritic chest pain 1/19 Added solumedrol 1/22 TCTS consulted  STUDIES: 1/09 CT chest >> multifocal pneumonia, small Rt pleural effusion, multiple pulmonary nodules, mod pericardial effusion 1/11 Echo [facility]) >> large pericardial effusion with early tamponade 1/11 Echo >> EF 65-70%. Trivial residual pericardial effusion 1/11 Labs >> ESR 2, ANCA negative, RF < 10, anti CCP < 2, ANA negative 1/12 Quantiferon gold >> negative 1/14 Bronchoscopy >> normal airway exam, cytology shows severe 2nd to inflammation 1/16 Echocardiogram >> There was no pericardial effusion 1/20 CT chest >>  Trace pericardial fluid, consolidation RUL, Rt > Lt peribronchovascular nodules, small Rt effusion, lytic appearing lesin Lt fifth rib, lucency C6 vertebral body   CULTURES: Pericardial AFB 1/11 >> smear negative >>  HIV serology 1/12 >> negative Strep Ag 1/12 >> negative Legionella Ag 1/12 >> negative PCP 1/14>>> Sputum from FOB 1/14>>>NEG  Legionella from FOB 1/14>>> NEG  AFB from FOB 1/14 >> smear neg >>  Fungal from FOB 1/14 >> smear neg >>   ANTIBIOTICS: Vancomycin 1/11 >> 1/15 Zosyn 1/11 >> 1/11 Levaquin 1/11 >>   SUBJECTIVE: Still has chest pain >> more on Lt side under Lt breast area.  Cough better with cough medicine.  Not bringing up sputum.  Breathing okay at rest >> winded with talking and exertion.  She denies any occupational exposures, travel hx, or  animal exposures.  OBJECTIVE: BP 112/67  Pulse 65  Temp(Src) 98 F (36.7 C) (Oral)  Resp 18  Ht _0  (1.676 m)  Wt 163 lb 9.3 oz (74.2 kg)  BMI 26.42 kg/m2  SpO2 93% Room air  EXAM:  Gen: no distress HEENT: no sinus tenderness Lungs: few scattered rhonchi, no audible wheeze, diminished bases  Cardiovascular: regular, no murmur Chest: tenderness on Lt chest Abdomen: Soft, non tender Ext: no edema Neuro: Normal strength Skin: no rashes  DATA:  CBC Recent Labs     01/20/13  0615  WBC  8.9  HGB  13.8  HCT  40.1  PLT  261    BMET Recent Labs     01/20/13  0615  NA  141  K  4.2  CL  104  CO2  24  BUN  16  CREATININE  0.60  GLUCOSE  144*   Imaging Dg Chest 2 View  01/22/2013   CLINICAL DATA:  Pneumonia, short of breath, cough  EXAM: CHEST  2 VIEW  COMPARISON:  Prior chest x-ray 01/20/2013; CT chest 01/21/2013  FINDINGS: Stable patchy airspace disease throughout the right upper lobe and superior segment of the right lower lobe. There has been some interval improvement in perihilar atelectasis/ consolidation. Persistent small layering right pleural effusion. Multifocal nodular opacities noted throughout the left upper lung as seen on prior CT. Expansile lesion in the left anterolateral sixth rib.  IMPRESSION: Slightly improved perihilar atelectasis/consolidation on the right. Otherwise, the appearance of the lungs has not significantly changed.  Considerations remain consistent with either a multifocal atypical pneumonia, or pneumonia superimposed on a metastatic malignant process.  Electronically Signed   By: Jacqulynn Cadet M.D.   On: 01/22/2013 08:21   Ct Chest W Contrast  01/21/2013   CLINICAL DATA:  Followup evaluation for pneumonia. Persistent pleuritic chest pain.  EXAM: CT CHEST WITH CONTRAST  TECHNIQUE: Multidetector CT imaging of the chest was performed during intravenous contrast administration.  CONTRAST:  73mL OMNIPAQUE IOHEXOL 300 MG/ML  SOLN  COMPARISON:   Chest CT 01/09/2013.  FINDINGS: Mediastinum: Heart size is normal. Trace amount of pericardial fluid and/or thickening (intermediate attenuation), decreased compared to the prior study, unlikely to be of hemodynamic significance at this time. Small hiatal hernia. No definite pathologically enlarged mediastinal lymph nodes. Prominent soft tissue in the hilar regions bilaterally, favored to represent lymphadenopathy, although difficult to discretely measure.  Lungs/Pleura: Extensive airspace consolidation is again noted in the right upper lobe, although slightly improved compared to the prior study. There is again extensive bronchial wall thickening, thickening of the peribronchovascular interstitium, and innumerable peribronchovascular micro and macronodules scattered throughout the lungs bilaterally (right greater than left), similar to the prior examination. In the medial aspect of the right upper lobe (image 16 of series 2) there is also a focal mass like opacity measuring 3.6 x 2.2 cm (image 16 of series 2). Small right pleural effusion, simple in appearance, layering dependently in and slightly decreased compared the prior study.  Upper Abdomen: Mild scarring in the upper pole of the kidneys bilaterally.  Musculoskeletal: Expansile lytic appearing lesion in the anterolateral aspect of the left fifth rib. There is also a lucent lesion in the posterior aspect of C6 vertebral body measuring 7 mm.  IMPRESSION: 1. Highly unusual appearance of the a lungs, particularly when compared to prior study 01/09/2013. Although the right upper lobe pneumonia on the prior study has slightly improved on today's examination, although there continues to be considerable airspace consolidation throughout the right upper lobe. In addition, today's demonstrates a mass like area in the medial aspect of the right upper lobe (image 16 of series 2) measuring 3.6 x 2.2 cm, which could represent a neoplasm. There are also potential lytic  lesions in the C6 vertebral body and the left sixth rib the anterolaterally. Although the findings in the lungs may simply all be part of an underlying atypical infectious process such as a fungal pneumonia, the possibility of neoplasm with a combination of lymphangitic spread of disease and hematogenous spread of disease throughout the lungs (in addition to superimposed infection) warrants consideration. 2. Small right pleural effusion is simple in appearance has and has decreased slightly compared to the prior study. 3. Near complete resolution of the previously noted pericardial effusion. 4. Small hiatal hernia.   Electronically Signed   By: Vinnie Langton M.D.   On: 01/21/2013 16:10     ASSESSMENT/PLAN:  A: Rt upper lung consolidation/mass with Rt > Lt multiple pulmonary nodules, ?lytic lesion Lt 5th rib, and possible lesion C-spine.  Former smoker. Unusual appearance for community acquired PNA.  No risks factors for atypical infections(ie blastomycosis, nocardia).  Concern now stronger for malignancy. P: -consult thoracic surgery 1/21 to assess for VATS bx -continue levaquin, day 14 of Abx  A: Pericardial effusion s/p drain. P: -plan to f/u limited Echo on 1/23  A: Pleuritic chest pain. P: -continue IV solumedrol for now  -continue anti-tussive medications  A: Acute hypoxic respiratory failure P: -oxygen as needed to keep SpO2 > 92%  A: Difficult IV access. P: -in anticipation of need for IV access prior to VATS bx  will arrange for IV team to place PICC line   Summary: Reviewed most recent CT chest findings with pt.  Concern is for malignancy versus atypical infection.  Need further tissue diagnosis.  Discussed pro's/con's of bronchoscopy with transbronchial biopsy versus VATs lung biopsy.  She had difficult time with bronchoscopy earlier this admission >> she reports terrible gag, and would not want to have bronchoscopy again.  Also she is reluctant to take chance that  transbronchial biopsy specimens may be non-diagnostic.  As a result, she would prefer to have discussion with thoracic surgery about proceeding with VATs lung biopsy.  Chesley Mires, MD [facility] Pulmonary/Critical Care 01/22/2013, 9:08 AM Pager:  [phone] After 3pm call: [phone]

## 2013-01-23 ENCOUNTER — Encounter (HOSPITAL_COMMUNITY)
Admission: RE | Disposition: A | Discharge status: Home / Self Care | Payer: Self-pay | Source: Other Acute Inpatient Hospital | Attending: Pulmonary Disease

## 2013-01-23 DIAGNOSIS — I519 Heart disease, unspecified: Secondary | ICD-10-CM

## 2013-01-23 SURGERY — VIDEO ASSISTED THORACOSCOPY
Anesthesia: General | Site: Chest | Laterality: Right

## 2013-01-23 MED ORDER — SODIUM CHLORIDE 0.9 % IJ SOLN
10.0000 mL | INTRAMUSCULAR | Status: DC | PRN
  Administered 2013-01-26 – 2013-01-27 (×2): 10 mL
  Administered 2013-01-30: 20 mL
  Administered 2013-01-31: 10 mL
  Filled 2013-01-23: qty 20

## 2013-01-23 NOTE — Significant Event (Signed)
Received information from IR.  They are reluctant to do needle biopsy with concern for infection.  Will send for repeat sputum gram stain/culture, AFB, and fungal cx.  Will need to discuss further with TCTS about best approach for tissue dx >> could consider doing bronchoscopy under general anesthesia, and if quick path review non-diagnostic then possibly switch to VATS.  Chesley Mires, MD [facility] Pulmonary/Critical Care 01/23/2013, 3:47 PM Pager:  [phone] After 3pm call: [phone]

## 2013-01-23 NOTE — Progress Notes (Signed)
Peripherally Inserted Central Catheter/Midline Placement  The IV Nurse has discussed with the patient and/or persons authorized to consent for the patient, the purpose of this procedure and the potential benefits and risks involved with this procedure.  The benefits include less needle sticks, lab draws from the catheter and patient may be discharged home with the catheter.  Risks include, but not limited to, infection, bleeding, blood clot (thrombus formation), and puncture of an artery; nerve damage and irregular heat beat.  Alternatives to this procedure were also discussed.  PICC/Midline Placement Documentation        [name] 01/23/2013, 5:39 PM

## 2013-01-23 NOTE — Progress Notes (Signed)
PULMONARY/CCM NOTE  Requesting MD/Service: Nishan/Cards Date of admission: 1/11 Date of consult: 1/12 Reason for consultation: abnormal CXR  BRIEF PATIENT DESCRIPTION:  53 yo female presented to [facility] on 1/9 with dyspnea, and transferred to [facility] on 1/11 for management of pericardial effusion.  PCCM consulted to assist with respiratory management.  SIGNIFICANT EVENTS: 1/09 Admit to [facility] 1/11 Transfer to [facility], pericardial drain placed >> cytology negative 1/12 PCCM consulted 1/13 pericardial drain removed 1/14 Transfer to PCCM service 1/15 Pleuritic chest pain 1/19 Added solumedrol 1/22 TCTS consulted >> recommend IR to do bx of RUL first  STUDIES: 1/09 CT chest >> multifocal pneumonia, small Rt pleural effusion, multiple pulmonary nodules, mod pericardial effusion 1/11 Echo [facility]) >> large pericardial effusion with early tamponade 1/11 Echo >> EF 65-70%. Trivial residual pericardial effusion 1/11 Labs >> ESR 2, ANCA negative, RF < 10, anti CCP < 2, ANA negative 1/12 Quantiferon gold >> negative 1/14 Bronchoscopy >> normal airway exam, cytology shows severe 2nd to inflammation 1/16 Echocardiogram >> There was no pericardial effusion 1/20 CT chest >>  Trace pericardial fluid, consolidation RUL, Rt > Lt peribronchovascular nodules, small Rt effusion, lytic appearing lesin Lt fifth rib, lucency C6 vertebral body   CULTURES: Pericardial AFB 1/11 >> smear negative >>  HIV serology 1/12 >> negative Strep Ag 1/12 >> negative Legionella Ag 1/12 >> negative PCP 1/14>>> Sputum from FOB 1/14>>>NEG  Legionella from FOB 1/14>>> NEG  AFB from FOB 1/14 >> smear neg >>  Fungal from FOB 1/14 >> smear neg >>   ANTIBIOTICS: Vancomycin 1/11 >> 1/15 Zosyn 1/11 >> 1/11 Levaquin 1/11 >>   SUBJECTIVE: Chest pain worse today.  Still has cough with yellow sputum.    OBJECTIVE: BP 110/78  Pulse 86  Temp(Src) 97.7 F (36.5 C) (Oral)  Resp 20  Ht 5' 6"  (1.676 m)  Wt 74.2 kg (163 lb 9.3 oz)   BMI 26.42 kg/m2  SpO2 95% Room air  EXAM:  Gen: no distress HEENT: no sinus tenderness Lungs: few scattered rhonchi, no audible wheeze, diminished bases  Cardiovascular: regular, no murmur Chest: tenderness on Lt chest Abdomen: Soft, non tender Ext: no edema Neuro: Normal strength Skin: no rashes  DATA:  CBC No results found for this basename: WBC, HGB, HCT, PLT,  in the last 72 hours  BMET No results found for this basename: NA, K, CL, CO2, BUN, CREATININE, GLUCOSE,  in the last 72 hours  Imaging Dg Chest 2 View  01/22/2013   CLINICAL DATA:  Pneumonia, short of breath, cough  EXAM: CHEST  2 VIEW  COMPARISON:  Prior chest x-ray 01/20/2013; CT chest 01/21/2013  FINDINGS: Stable patchy airspace disease throughout the right upper lobe and superior segment of the right lower lobe. There has been some interval improvement in perihilar atelectasis/ consolidation. Persistent small layering right pleural effusion. Multifocal nodular opacities noted throughout the left upper lung as seen on prior CT. Expansile lesion in the left anterolateral sixth rib.  IMPRESSION: Slightly improved perihilar atelectasis/consolidation on the right. Otherwise, the appearance of the lungs has not significantly changed.  Considerations remain consistent with either a multifocal atypical pneumonia, or pneumonia superimposed on a metastatic malignant process.   Electronically Signed   By: Jacqulynn Cadet M.D.   On: 01/22/2013 08:21   Ct Chest W Contrast  01/21/2013   CLINICAL DATA:  Followup evaluation for pneumonia. Persistent pleuritic chest pain.  EXAM: CT CHEST WITH CONTRAST  TECHNIQUE: Multidetector CT imaging of the chest was performed during intravenous  contrast administration.  CONTRAST:  72m OMNIPAQUE IOHEXOL 300 MG/ML  SOLN  COMPARISON:  Chest CT 01/09/2013.  FINDINGS: Mediastinum: Heart size is normal. Trace amount of pericardial fluid and/or thickening (intermediate attenuation), decreased compared to  the prior study, unlikely to be of hemodynamic significance at this time. Small hiatal hernia. No definite pathologically enlarged mediastinal lymph nodes. Prominent soft tissue in the hilar regions bilaterally, favored to represent lymphadenopathy, although difficult to discretely measure.  Lungs/Pleura: Extensive airspace consolidation is again noted in the right upper lobe, although slightly improved compared to the prior study. There is again extensive bronchial wall thickening, thickening of the peribronchovascular interstitium, and innumerable peribronchovascular micro and macronodules scattered throughout the lungs bilaterally (right greater than left), similar to the prior examination. In the medial aspect of the right upper lobe (image 16 of series 2) there is also a focal mass like opacity measuring 3.6 x 2.2 cm (image 16 of series 2). Small right pleural effusion, simple in appearance, layering dependently in and slightly decreased compared the prior study.  Upper Abdomen: Mild scarring in the upper pole of the kidneys bilaterally.  Musculoskeletal: Expansile lytic appearing lesion in the anterolateral aspect of the left fifth rib. There is also a lucent lesion in the posterior aspect of C6 vertebral body measuring 7 mm.  IMPRESSION: 1. Highly unusual appearance of the a lungs, particularly when compared to prior study 01/09/2013. Although the right upper lobe pneumonia on the prior study has slightly improved on today's examination, although there continues to be considerable airspace consolidation throughout the right upper lobe. In addition, today's demonstrates a mass like area in the medial aspect of the right upper lobe (image 16 of series 2) measuring 3.6 x 2.2 cm, which could represent a neoplasm. There are also potential lytic lesions in the C6 vertebral body and the left sixth rib the anterolaterally. Although the findings in the lungs may simply all be part of an underlying atypical infectious  process such as a fungal pneumonia, the possibility of neoplasm with a combination of lymphangitic spread of disease and hematogenous spread of disease throughout the lungs (in addition to superimposed infection) warrants consideration. 2. Small right pleural effusion is simple in appearance has and has decreased slightly compared to the prior study. 3. Near complete resolution of the previously noted pericardial effusion. 4. Small hiatal hernia.   Electronically Signed   By: DVinnie LangtonM.D.   On: 01/21/2013 16:10     ASSESSMENT/PLAN:  A: Rt upper lung consolidation/mass with Rt > Lt multiple pulmonary nodules, ?lytic lesion Lt 5th rib, and possible lesion C-spine.  Former smoker. Unusual appearance for community acquired PNA.  No risks factors for atypical infections(ie blastomycosis, nocardia).  Concern now stronger for malignancy. (1/22) Thoracic surgery recommends holding off on thoracostomy at this time and instead asking IR to consider CT guided transthoracic needle biopsy.  P: -Consult IR for CT-guided transthoracic needle biopsy >> if non diagnostic, then reconsider bronchoscopy vs VATS lung bx -Continue levaquin, day 15 of Abx  A: Pericardial effusion s/p drain. P: - Echo Pending 1/23  A: Pleuritic chest pain. P: -continue IV solumedrol for now  -continue anti-tussive medications  A: Acute hypoxic respiratory failure P: -oxygen as needed to keep SpO2 > 92%  A: Difficult IV access. P: -IV team to place PICC line   Summary: She had difficult time with bronchoscopy earlier this admission >> she reports terrible gag reflex, and would not want to have bronchoscopy again.  Also she  is reluctant to take chance that transbronchial biopsy specimens may be non-diagnostic.  Await assessment for IR for needle biopsy of Rt upper lobe lesion.  Chesley Mires, MD [facility] Pulmonary/Critical Care 01/23/2013, 11:29 AM Pager:  [phone] After 3pm call: [phone]

## 2013-01-23 NOTE — Progress Notes (Signed)
Echocardiogram 2D Echocardiogram limited has been performed.  Reagen Haberman 01/23/2013, 3:22 PM

## 2013-01-23 NOTE — Progress Notes (Signed)
Thoracic Surgery  Patient discussed with Dr Halford Chessman and situation reviewed with patient To best direct care of probable atypical pulmonary infection tissue is needed Will plan bronch and R VATS biopsy in OR under general anesthesia next week -- Tues am

## 2013-01-24 LAB — BASIC METABOLIC PANEL
BUN: 23 mg/dL (ref 6–23)
CHLORIDE: 102 meq/L (ref 96–112)
CO2: 26 meq/L (ref 19–32)
Calcium: 8.9 mg/dL (ref 8.4–10.5)
Creatinine, Ser: 0.6 mg/dL (ref 0.50–1.10)
GFR calc Af Amer: >90 mL/min (ref >90)
GFR calc non Af Amer: >90 mL/min (ref >90)
Glucose, Bld: 148 mg/dL — ABNORMAL HIGH (ref 70–99)
Potassium: 4.8 mEq/L (ref 3.7–5.3)
Sodium: 141 mEq/L (ref 137–147)

## 2013-01-24 LAB — CBC
HEMATOCRIT: 39.8 % (ref 36.0–46.0)
HEMOGLOBIN: 13.5 g/dL (ref 12.0–15.0)
MCH: 29.9 pg (ref 26.0–34.0)
MCHC: 33.9 g/dL (ref 30.0–36.0)
MCV: 88.2 fL (ref 78.0–100.0)
Platelets: 334 10*3/uL (ref 150–400)
RBC: 4.51 MIL/uL (ref 3.87–5.11)
RDW: 12.9 % (ref 11.5–15.5)
WBC: 15.8 10*3/uL — ABNORMAL HIGH (ref 4.0–10.5)

## 2013-01-24 MED ORDER — PANTOPRAZOLE SODIUM 40 MG PO TBEC
40.0000 mg | DELAYED_RELEASE_TABLET | Freq: Two times a day (BID) | ORAL | Status: DC
  Administered 2013-01-24 – 2013-02-02 (×17): 40 mg via ORAL
  Filled 2013-01-24 (×18): qty 1

## 2013-01-24 NOTE — Progress Notes (Signed)
PULMONARY/CCM PROGRESS NOTE  Requesting MD/Service: Nishan/Cards Date of admission: 1/11 Date of consult: 1/12 Reason for consultation: abnormal CXR  BRIEF PATIENT DESCRIPTION:  53 yo female presented to [facility] on 1/9 with dyspnea, and transferred to [facility] on 1/11 for management of pericardial effusion.  PCCM consulted to assist with respiratory management.  SIGNIFICANT EVENTS: 1/09 Admit to [facility] 1/11 Transfer to [facility], pericardial drain placed >> cytology negative 1/12 PCCM consulted 1/13 pericardial drain removed 1/14 Transfer to PCCM service 1/15 Pleuritic chest pain 1/19 Added solumedrol 1/22 TCTS consulted >> recommend IR to do bx of RUL first, they declined, plan is for bronch/VATS 1/27 by TCTS  STUDIES: 1/09 CT chest >> multifocal pneumonia, small Rt pleural effusion, multiple pulmonary nodules, mod pericardial effusion 1/11 Echo [facility]) >> large pericardial effusion with early tamponade 1/11 Echo >> EF 65-70%. Trivial residual pericardial effusion 1/11 Labs >> ESR 2, ANCA negative, RF < 10, anti CCP < 2, ANA negative 1/12 Quantiferon gold >> negative 1/14 Bronchoscopy >> normal airway exam, cytology shows severe 2nd to inflammation 1/16 Echocardiogram >> There was no pericardial effusion 1/20 CT chest >>  Trace pericardial fluid, consolidation RUL, Rt > Lt peribronchovascular nodules, small Rt effusion, lytic appearing lesin Lt fifth rib, lucency C6 vertebral body   CULTURES: Pericardial AFB 1/11 >> smear negative >>  HIV serology 1/12 >> negative Strep Ag 1/12 >> negative Legionella Ag 1/12 >> negative PCP 1/14>>> Sputum from FOB 1/14>>>NEG  Legionella from FOB 1/14>>> NEG  AFB from FOB 1/14 >> smear neg >>  Fungal from FOB 1/14 >> smear neg >>   ANTIBIOTICS: Vancomycin 1/11 >> 1/15 Zosyn 1/11 >> 1/11 Levaquin 1/11 >>   SUBJECTIVE: Chest pain worse today.  Still has cough with yellow sputum.     OBJECTIVE: BP 110/67  Pulse 79  Temp(Src) 97.8 F (36.6 C) (Oral)  Resp 16  Ht 5' 6"  (1.676 m)  Wt 74.2 kg (163 lb 9.3 oz)  BMI 26.42 kg/m2  SpO2 94% Room air  EXAM:  Gen: no distress HEENT: no sinus tenderness Lungs: few scattered rhonchi, no audible wheeze, diminished bases  Cardiovascular: regular, no murmur Chest: tenderness on Lt chest Abdomen: Soft, non tender Ext: no edema Neuro: Normal strength Skin: no rashes  DATA:  CBC Recent Labs     01/24/13  0500  WBC  15.8*  HGB  13.5  HCT  39.8  PLT  334    BMET Recent Labs     01/24/13  0500  NA  141  K  4.8  CL  102  CO2  26  BUN  23  CREATININE  0.60  GLUCOSE  148*    Imaging No results found.   ASSESSMENT/PLAN:  A: Rt upper lung consolidation/mass with Rt > Lt multiple pulmonary nodules, ?lytic lesion Lt 5th rib, and possible lesion C-spine.  Former smoker. Unusual appearance for community acquired PNA.  No risks factors for atypical infections(ie blastomycosis, nocardia).  Concern now stronger for malignancy. (1/22) After discussion w/ TCTS & IR- plan is for Bronch/VATS 01/27/13 by TCTS... P: -On [id number] -Continue levaquin 750po, day 15 of Abx -Tussionex prn  A: Pericardial effusion s/p drain. P: - Echo 1/23 showed norm LV size 7 function w/ EF=55-60%, Gr1DD, valves ok, triv peric fluid remains...   A: Pleuritic chest pain. P: -continue IV solumedrol for now  -continue anti-tussive medications  A: Acute hypoxic respiratory failure P: -oxygen as needed to keep SpO2 > 92%  A: Difficult IV access. P: -  IV team to place PICC line   Summary: DrVanTright plans Bronch & prob VATS Tues 01/27/13   [name]. Lenna Gilford, [place] Pulmonary/Critical Care 01/24/2013, 8:43 AM [phone]

## 2013-01-25 ENCOUNTER — Inpatient Hospital Stay (HOSPITAL_COMMUNITY): Payer: Medicaid Other

## 2013-01-25 LAB — CREATININE, SERUM
Creatinine, Ser: 0.61 mg/dL (ref 0.50–1.10)
GFR calc non Af Amer: >90 mL/min (ref >90)

## 2013-01-25 MED ORDER — METHYLPREDNISOLONE SODIUM SUCC 125 MG IJ SOLR
80.0000 mg | Freq: Two times a day (BID) | INTRAMUSCULAR | Status: DC
  Administered 2013-01-25 – 2013-01-28 (×5): 80 mg via INTRAVENOUS
  Filled 2013-01-25 (×2): qty 2
  Filled 2013-01-25 (×7): qty 1.28

## 2013-01-25 MED ORDER — PROMETHAZINE HCL 25 MG/ML IJ SOLN
12.5000 mg | Freq: Four times a day (QID) | INTRAMUSCULAR | Status: DC | PRN
  Administered 2013-01-25 – 2013-02-02 (×10): 12.5 mg via INTRAVENOUS
  Filled 2013-01-25 (×10): qty 1

## 2013-01-25 NOTE — Progress Notes (Signed)
PULMONARY/CCM PROGRESS NOTE  Requesting MD/Service: Nishan/Cards Date of admission: 1/11 Date of consult: 1/12 Reason for consultation: abnormal CXR  BRIEF PATIENT DESCRIPTION:  53 yo female presented to [facility] on 1/9 with dyspnea, and transferred to [facility] on 1/11 for management of pericardial effusion.  PCCM consulted to assist with respiratory management.  SIGNIFICANT EVENTS: 1/09 Admit to [facility] 1/11 Transfer to [facility], pericardial drain placed >> cytology negative 1/12 PCCM consulted 1/13 pericardial drain removed 1/14 Transfer to PCCM service 1/15 Pleuritic chest pain 1/19 Added solumedrol 1/22 TCTS consulted >> recommend IR to do bx of RUL first, they declined, plan is for bronch/VATS 1/27 by TCTS  STUDIES: 1/09 CT chest >> multifocal pneumonia, small Rt pleural effusion, multiple pulmonary nodules, mod pericardial effusion 1/11 Echo [facility]) >> large pericardial effusion with early tamponade 1/11 Echo >> EF 65-70%. Trivial residual pericardial effusion 1/11 Labs >> ESR 2, ANCA negative, RF < 10, anti CCP < 2, ANA negative 1/12 Quantiferon gold >> negative 1/14 Bronchoscopy >> normal airway exam, cytology shows severe 2nd to inflammation 1/16 Echocardiogram >> There was no pericardial effusion 1/20 CT chest >>  Trace pericardial fluid, consolidation RUL, Rt > Lt peribronchovascular nodules, small Rt effusion, lytic appearing lesin Lt fifth rib, lucency C6 vertebral body   CULTURES: Pericardial AFB 1/11 >> smear negative >>  HIV serology 1/12 >> negative Strep Ag 1/12 >> negative Legionella Ag 1/12 >> negative PCP 1/14>>> Sputum from FOB 1/14>>>NEG  Legionella from FOB 1/14>>> NEG  AFB from FOB 1/14 >> smear neg >>  Fungal from FOB 1/14 >> smear neg >>   ANTIBIOTICS: Vancomycin 1/11 >> 1/15 Zosyn 1/11 >> 1/11 Levaquin 1/11 >>   SUBJECTIVE: Chest pain worse today.  Still has cough with yellow sputum.     OBJECTIVE: BP 118/84  Pulse 75  Temp(Src) 97.7 F (36.5 C) (Oral)  Resp 20  Ht 5' 6"  (1.676 m)  Wt 74.2 kg (163 lb 9.3 oz)  BMI 26.42 kg/m2  SpO2 98% Room air  EXAM:  Gen: no distress HEENT: no sinus tenderness Lungs: few scattered rhonchi, no audible wheeze, diminished bases  Cardiovascular: regular, no murmur Chest: tenderness on Lt chest Abdomen: Soft, non tender Ext: no edema Neuro: Normal strength Skin: no rashes  DATA:  CBC Recent Labs     01/24/13  0500  WBC  15.8*  HGB  13.5  HCT  39.8  PLT  334    BMET Recent Labs     01/24/13  0500  01/25/13  0500  NA  141   --   K  4.8   --   CL  102   --   CO2  26   --   BUN  23   --   CREATININE  0.60  0.61  GLUCOSE  148*   --     Imaging No results found.   ASSESSMENT/PLAN:  A: Rt upper lung consolidation/mass with Rt > Lt multiple pulmonary nodules, ?lytic lesion Lt 5th rib, and possible lesion C-spine.  Former smoker. Unusual appearance for community acquired PNA.  No risks factors for atypical infections(ie blastomycosis, nocardia).  Concern now stronger for malignancy. (1/22) After discussion w/ TCTS & IR- plan is for Bronch/VATS 01/27/13 by TCTS... P: -On Solumedrol40IVQ6h => change to 80 Q12h -Continue levaquin 750po, day 15 of Abx -Tussionex prn  A: Pericardial effusion s/p drain. P: - Echo 1/23 showed norm LV size 7 function w/ EF=55-60%, Gr1DD, valves ok, triv peric fluid remains.Marland KitchenMarland Kitchen  A: Pleuritic chest pain. P: -continue IV solumedrol for now  -continue anti-tussive medications  A: Acute hypoxic respiratory failure P: -oxygen as needed to keep SpO2 > 92%  A: Difficult IV access. P: -IV team to place PICC line   Summary: DrVanTright plans Bronch & prob VATS Tues 01/27/13   [name]. Lenna Gilford, [place] [facility] 01/25/2013, 8:38 AM [phone]

## 2013-01-26 ENCOUNTER — Inpatient Hospital Stay (HOSPITAL_COMMUNITY): Payer: Medicaid Other

## 2013-01-26 LAB — ABO/RH: ABO/RH(D): O POS

## 2013-01-26 LAB — CBC
HCT: 39.6 % (ref 36.0–46.0)
Hemoglobin: 13.5 g/dL (ref 12.0–15.0)
MCH: 30.5 pg (ref 26.0–34.0)
MCHC: 34.1 g/dL (ref 30.0–36.0)
MCV: 89.6 fL (ref 78.0–100.0)
Platelets: 322 10*3/uL (ref 150–400)
RBC: 4.42 MIL/uL (ref 3.87–5.11)
RDW: 13.3 % (ref 11.5–15.5)
WBC: 18.2 10*3/uL — ABNORMAL HIGH (ref 4.0–10.5)

## 2013-01-26 LAB — COMPREHENSIVE METABOLIC PANEL
ALT: 28 U/L (ref 0–35)
AST: 18 U/L (ref 0–37)
Albumin: 3 g/dL — ABNORMAL LOW (ref 3.5–5.2)
Alkaline Phosphatase: 93 U/L (ref 39–117)
BUN: 25 mg/dL — ABNORMAL HIGH (ref 6–23)
CO2: 22 mEq/L (ref 19–32)
Calcium: 8.4 mg/dL (ref 8.4–10.5)
Chloride: 101 mEq/L (ref 96–112)
Creatinine, Ser: 0.7 mg/dL (ref 0.50–1.10)
GFR calc Af Amer: >90 mL/min (ref >90)
GFR calc non Af Amer: >90 mL/min (ref >90)
Glucose, Bld: 172 mg/dL — ABNORMAL HIGH (ref 70–99)
Potassium: 4.4 mEq/L (ref 3.7–5.3)
Sodium: 138 mEq/L (ref 137–147)
Total Bilirubin: <0.2 mg/dL — ABNORMAL LOW (ref 0.3–1.2)
Total Protein: 5.8 g/dL — ABNORMAL LOW (ref 6.0–8.3)

## 2013-01-26 LAB — PROTIME-INR
INR: 1.08 (ref 0.00–1.49)
Prothrombin Time: 13.8 seconds (ref 11.6–15.2)

## 2013-01-26 LAB — PREPARE RBC (CROSSMATCH)

## 2013-01-26 LAB — BLOOD GAS, ARTERIAL
Acid-base deficit: 1 mmol/L (ref 0.0–2.0)
Bicarbonate: 23.1 mEq/L (ref 20.0–24.0)
DRAWN BY: 24487
FIO2: 0.21 %
O2 Saturation: 94.8 %
PCO2 ART: 37.9 mmHg (ref 35.0–45.0)
PH ART: 7.402 (ref 7.350–7.450)
PO2 ART: 75.2 mmHg — AB (ref 80.0–100.0)
Patient temperature: 98.6
TCO2: 24.2 mmol/L (ref 0–100)

## 2013-01-26 LAB — APTT: aPTT: 24 seconds (ref 24–37)

## 2013-01-26 MED ORDER — DEXTROSE 5 % IV SOLN
1.5000 g | INTRAVENOUS | Status: AC
  Administered 2013-01-27: 1.5 g via INTRAVENOUS
  Filled 2013-01-26: qty 1.5

## 2013-01-26 NOTE — Progress Notes (Signed)
**Note De-Identified  Obfuscation** RT note: patient refused ABG; she states that since she has her PICC there should not be anymore sticks.

## 2013-01-26 NOTE — Progress Notes (Signed)
PULMONARY/CCM PROGRESS NOTE  Requesting MD/Service: Nishan/Cards Date of admission: 1/11 Date of consult: 1/12 Reason for consultation: abnormal CXR  BRIEF PATIENT DESCRIPTION:  53 yo female presented to [facility] on 1/9 with dyspnea, and transferred to [facility] on 1/11 for management of pericardial effusion.  PCCM consulted to assist with respiratory management.  SIGNIFICANT EVENTS: 1/09 Admit to [facility] 1/11 Transfer to [facility], pericardial drain placed >> cytology negative 1/12 PCCM consulted 1/13 pericardial drain removed 1/14 Transfer to PCCM service 1/15 Pleuritic chest pain 1/19 Added solumedrol 1/22 TCTS consulted >> recommend IR to do bx of RUL first, they declined, plan is for bronch/VATS 1/27 by TCTS  STUDIES: 1/09 CT chest >> multifocal pneumonia, small Rt pleural effusion, multiple pulmonary nodules, mod pericardial effusion 1/11 Echo [facility]) >> large pericardial effusion with early tamponade 1/11 Echo >> EF 65-70%. Trivial residual pericardial effusion 1/11 Labs >> ESR 2, ANCA negative, RF < 10, anti CCP < 2, ANA negative 1/12 Quantiferon gold >> negative 1/14 Bronchoscopy >> normal airway exam, cytology shows severe 2nd to inflammation 1/16 Echocardiogram >> There was no pericardial effusion 1/20 CT chest >>  Trace pericardial fluid, consolidation RUL, Rt > Lt peribronchovascular nodules, small Rt effusion, lytic appearing lesin Lt fifth rib, lucency C6 vertebral body   CULTURES: Pericardial AFB 1/11 >> smear negative >>  HIV serology 1/12 >> negative Strep Ag 1/12 >> negative Legionella Ag 1/12 >> negative PCP 1/14>>> Sputum from FOB 1/14>>>NEG  Legionella from FOB 1/14>>> NEG  AFB from FOB 1/14 >> smear neg >>  Fungal from FOB 1/14 >> smear neg >>   ANTIBIOTICS: Vancomycin 1/11 >> 1/15 Zosyn 1/11 >> 1/11 Levaquin 1/11 >>   SUBJECTIVE: Feeling a little better.  Anxious about VATS in am.    OBJECTIVE: BP 124/72  Pulse 73  Temp(Src) 97.7 F (36.5 C) (Oral)  Resp 20  Ht _0  (1.676 m)  Wt 186 lb 14.4 oz (84.777 kg)  BMI 30.18 kg/m2  SpO2 94% Room air  EXAM:  Gen: no distress HEENT: no sinus tenderness Lungs: few scattered rhonchi, no audible wheeze, diminished bases  Cardiovascular: regular, no murmur Chest: tenderness on Lt chest Abdomen: Soft, non tender Ext: no edema Neuro: Normal strength Skin: no rashes  DATA:  CBC Recent Labs     01/24/13  0500  WBC  15.8*  HGB  13.5  HCT  39.8  PLT  334    BMET Recent Labs     01/24/13  0500  01/25/13  0500  NA  141   --   K  4.8   --   CL  102   --   CO2  26   --   BUN  23   --   CREATININE  0.60  0.61  GLUCOSE  148*   --     Imaging Dg Chest 2 View  01/25/2013   CLINICAL DATA:  Pneumonia, weakness, shortness of breath  EXAM: CHEST  2 VIEW  COMPARISON:  01/22/2013  FINDINGS: Right-sided PICC line has been placed with tip over the mid SVC. No pneumothorax. Right perihilar and upper lobe airspace consolidation is slightly less confluent than previously particularly at the right lung apex. Left lung is grossly clear. Diffusely mildly prominent interstitial markings are noted without focal pulmonary opacity otherwise. No pleural effusion. Heart size upper limits of normal.  IMPRESSION: Slight improvement in right upper lobe aeration with persistent right upper lobe predominant consolidation compatible with pneumonia.   Electronically Signed  By: Conchita Paris M.D.   On: 01/25/2013 15:17   Dg Chest Port 1 View  01/26/2013   CLINICAL DATA:  Shortness of breath, pneumonia, edema.  EXAM: PORTABLE CHEST - 1 VIEW  COMPARISON:  DG CHEST 2 VIEW dated 01/25/2013; CT CHEST W/CM dated 01/21/2013; CT ANGIO CHEST dated 01/09/2013  FINDINGS: Trachea is midline. Heart size stable. Right PICC tip projects over the SVC. Patchy right upper lobe airspace consolidation. Diffuse bilateral nodularity in the lungs is better seen on  01/21/2013. No definite pleural fluid.  IMPRESSION: Right upper lobe airspace consolidation with diffuse bilateral pulmonary nodularity, better seen and evaluated on 01/21/2013. Underlying malignancy is considered.   Electronically Signed   By: Lorin Picket M.D.   On: 01/26/2013 08:47     ASSESSMENT/PLAN:  A: Rt upper lung consolidation/mass with Rt > Lt multiple pulmonary nodules, ?lytic lesion Lt 5th rib, and possible lesion C-spine.  Former smoker. Unusual appearance for community acquired PNA.  No risks factors for atypical infections(ie blastomycosis, nocardia).  Concern now stronger for malignancy. (1/22) After discussion w/ TCTS & IR- plan is for Bronch/VATS 01/27/13 by TCTS... P: -continue solumedrol 80 Q12h -Continue levaquin 750po, day 16 of Abx -Tussionex prn -VATS bx 1/27 per CVTS - f/u path   A: Pericardial effusion s/p drain. P: - f/u Echo 1/23 showed norm LV size 7 function w/ EF=55-60%, Gr1DD, valves ok, triv peric fluid remains...   A: Pleuritic chest pain. P: -continue IV solumedrol for now  -continue anti-tussive medications  A: Acute hypoxic respiratory failure P: -oxygen as needed to keep SpO2 > 92%    Summary: DrVanTright plans Bronch & prob VATS Tues 01/27/13   Darlina Sicilian, NP 01/26/2013  10:36 AM Pager: [phone] or [phone]  *Care during the described time interval was provided by me and/or other providers on the critical care team. I have reviewed this patient's available data, including medical history, events of note, physical examination and test results as part of my evaluation.  Patient seen and examined, agree with above note.  I dictated the care and orders written for this patient under my direction.  Rush Farmer, MD [phone]

## 2013-01-27 ENCOUNTER — Inpatient Hospital Stay (HOSPITAL_COMMUNITY): Payer: Medicaid Other | Admitting: Anesthesiology

## 2013-01-27 ENCOUNTER — Encounter (HOSPITAL_COMMUNITY)
Admission: RE | Disposition: A | Discharge status: Home / Self Care | Payer: Self-pay | Source: Other Acute Inpatient Hospital | Attending: Pulmonary Disease

## 2013-01-27 ENCOUNTER — Encounter (HOSPITAL_COMMUNITY): Payer: Medicaid Other | Admitting: Anesthesiology

## 2013-01-27 ENCOUNTER — Inpatient Hospital Stay (HOSPITAL_COMMUNITY): Payer: Medicaid Other

## 2013-01-27 ENCOUNTER — Encounter (HOSPITAL_COMMUNITY): Payer: Self-pay | Admitting: Anesthesiology

## 2013-01-27 DIAGNOSIS — R918 Other nonspecific abnormal finding of lung field: Secondary | ICD-10-CM

## 2013-01-27 HISTORY — PX: LUNG BIOPSY: SHX5088

## 2013-01-27 HISTORY — PX: VIDEO BRONCHOSCOPY: SHX5072

## 2013-01-27 HISTORY — PX: VIDEO ASSISTED THORACOSCOPY: SHX5073

## 2013-01-27 LAB — BLOOD GAS, ARTERIAL
Acid-Base Excess: 3.1 mmol/L — ABNORMAL HIGH (ref 0.0–2.0)
BICARBONATE: 27.8 meq/L — AB (ref 20.0–24.0)
O2 Content: 6 L/min
O2 Saturation: 97.2 %
PCO2 ART: 46.9 mmHg — AB (ref 35.0–45.0)
PH ART: 7.388 (ref 7.350–7.450)
Patient temperature: 97.7
TCO2: 29.3 mmol/L (ref 0–100)
pO2, Arterial: 89.8 mmHg (ref 80.0–100.0)

## 2013-01-27 LAB — BASIC METABOLIC PANEL
BUN: 23 mg/dL (ref 6–23)
CHLORIDE: 99 meq/L (ref 96–112)
CO2: 26 meq/L (ref 19–32)
Calcium: 8.7 mg/dL (ref 8.4–10.5)
Creatinine, Ser: 0.58 mg/dL (ref 0.50–1.10)
GFR calc non Af Amer: >90 mL/min (ref >90)
Glucose, Bld: 136 mg/dL — ABNORMAL HIGH (ref 70–99)
POTASSIUM: 4.7 meq/L (ref 3.7–5.3)
SODIUM: 138 meq/L (ref 137–147)

## 2013-01-27 LAB — URINALYSIS, ROUTINE W REFLEX MICROSCOPIC
Bilirubin Urine: NEGATIVE
Glucose, UA: NEGATIVE mg/dL
Hgb urine dipstick: NEGATIVE
Ketones, ur: NEGATIVE mg/dL
Leukocytes, UA: NEGATIVE
Nitrite: NEGATIVE
Protein, ur: NEGATIVE mg/dL
Specific Gravity, Urine: 1.033 — ABNORMAL HIGH (ref 1.005–1.030)
Urobilinogen, UA: 0.2 mg/dL (ref 0.0–1.0)
pH: 6 (ref 5.0–8.0)

## 2013-01-27 LAB — CBC
HCT: 38.7 % (ref 36.0–46.0)
Hemoglobin: 13.4 g/dL (ref 12.0–15.0)
MCH: 30.8 pg (ref 26.0–34.0)
MCHC: 34.6 g/dL (ref 30.0–36.0)
MCV: 89 fL (ref 78.0–100.0)
PLATELETS: 332 10*3/uL (ref 150–400)
RBC: 4.35 MIL/uL (ref 3.87–5.11)
RDW: 13.4 % (ref 11.5–15.5)
WBC: 18.5 10*3/uL — ABNORMAL HIGH (ref 4.0–10.5)

## 2013-01-27 SURGERY — BRONCHOSCOPY, VIDEO-ASSISTED
Anesthesia: General | Site: Chest | Laterality: Right

## 2013-01-27 MED ORDER — ONDANSETRON HCL 4 MG/2ML IJ SOLN
INTRAMUSCULAR | Status: DC | PRN
  Administered 2013-01-27: 4 mg via INTRAVENOUS

## 2013-01-27 MED ORDER — DEXTROSE 5 % IV SOLN
1.5000 g | Freq: Two times a day (BID) | INTRAVENOUS | Status: AC
  Administered 2013-01-27: 1.5 g via INTRAVENOUS
  Administered 2013-01-28: 11:00:00 via INTRAVENOUS
  Filled 2013-01-27 (×3): qty 1.5

## 2013-01-27 MED ORDER — POTASSIUM CHLORIDE 10 MEQ/50ML IV SOLN
10.0000 meq | Freq: Every day | INTRAVENOUS | Status: DC | PRN
Start: 2013-01-27 — End: 2013-02-02

## 2013-01-27 MED ORDER — ACETAMINOPHEN 500 MG PO TABS
1000.0000 mg | ORAL_TABLET | Freq: Four times a day (QID) | ORAL | Status: AC
Start: 2013-01-27 — End: 2013-01-28
  Administered 2013-01-28 (×2): 1000 mg via ORAL
  Filled 2013-01-27 (×2): qty 2

## 2013-01-27 MED ORDER — ONDANSETRON HCL 4 MG/2ML IJ SOLN
4.0000 mg | Freq: Four times a day (QID) | INTRAMUSCULAR | Status: DC | PRN
  Filled 2013-01-27: qty 2

## 2013-01-27 MED ORDER — SODIUM CHLORIDE 0.9 % IJ SOLN: 9.0000 mL | INTRAMUSCULAR | Status: DC | PRN

## 2013-01-27 MED ORDER — PROPOFOL 10 MG/ML IV BOLUS
INTRAVENOUS | Status: DC | PRN
  Administered 2013-01-27: 150 mg via INTRAVENOUS

## 2013-01-27 MED ORDER — PROPOFOL 10 MG/ML IV BOLUS
INTRAVENOUS | Status: AC
  Filled 2013-01-27: qty 20

## 2013-01-27 MED ORDER — ONDANSETRON HCL 4 MG/2ML IJ SOLN
INTRAMUSCULAR | Status: AC
  Filled 2013-01-27: qty 2

## 2013-01-27 MED ORDER — LIDOCAINE HCL (CARDIAC) 20 MG/ML IV SOLN
INTRAVENOUS | Status: DC | PRN
  Administered 2013-01-27: 80 mg via INTRAVENOUS

## 2013-01-27 MED ORDER — BISACODYL 5 MG PO TBEC
10.0000 mg | DELAYED_RELEASE_TABLET | Freq: Every day | ORAL | Status: DC
Start: 2013-01-27 — End: 2013-02-02
  Administered 2013-01-28 – 2013-01-31 (×4): 10 mg via ORAL
  Filled 2013-01-27 (×4): qty 2

## 2013-01-27 MED ORDER — FENTANYL 10 MCG/ML IV SOLN
INTRAVENOUS | Status: DC
  Administered 2013-01-27 – 2013-01-28 (×2): via INTRAVENOUS
  Administered 2013-01-28: 219.3 ug via INTRAVENOUS
  Administered 2013-01-28: 267 ug via INTRAVENOUS
  Administered 2013-01-28: 210 ug via INTRAVENOUS
  Administered 2013-01-28: 413.2 ug via INTRAVENOUS
  Administered 2013-01-28: 150 ug via INTRAVENOUS
  Administered 2013-01-29: 120 ug via INTRAVENOUS
  Administered 2013-01-29: 165 ug via INTRAVENOUS
  Administered 2013-01-29: 195 ug via INTRAVENOUS
  Administered 2013-01-29: via INTRAVENOUS
  Administered 2013-01-29: 162 ug via INTRAVENOUS
  Administered 2013-01-29: 105 ug via INTRAVENOUS
  Administered 2013-01-30: 12 ug via INTRAVENOUS
  Administered 2013-01-30: 120 ug via INTRAVENOUS
  Administered 2013-01-30: 225 ug via INTRAVENOUS
  Filled 2013-01-27 (×5): qty 50

## 2013-01-27 MED ORDER — DEXTROSE-NACL 5-0.45 % IV SOLN
INTRAVENOUS | Status: DC
  Administered 2013-01-27 – 2013-01-28 (×3): via INTRAVENOUS
  Administered 2013-01-29: 100 mL/h via INTRAVENOUS

## 2013-01-27 MED ORDER — NEOSTIGMINE METHYLSULFATE 1 MG/ML IJ SOLN
INTRAMUSCULAR | Status: DC | PRN
  Administered 2013-01-27: 4 mg via INTRAVENOUS
  Administered 2013-01-27: 1 mg via INTRAVENOUS

## 2013-01-27 MED ORDER — ROCURONIUM BROMIDE 50 MG/5ML IV SOLN
INTRAVENOUS | Status: AC
  Filled 2013-01-27: qty 1

## 2013-01-27 MED ORDER — 0.9 % SODIUM CHLORIDE (POUR BTL) OPTIME
TOPICAL | Status: DC | PRN
  Administered 2013-01-27: 3000 mL

## 2013-01-27 MED ORDER — FENTANYL CITRATE 0.05 MG/ML IJ SOLN
INTRAMUSCULAR | Status: DC | PRN
  Administered 2013-01-27: 25 ug via INTRAVENOUS
  Administered 2013-01-27: 50 ug via INTRAVENOUS
  Administered 2013-01-27: 100 ug via INTRAVENOUS
  Administered 2013-01-27: 50 ug via INTRAVENOUS
  Administered 2013-01-27: 75 ug via INTRAVENOUS
  Administered 2013-01-27: 50 ug via INTRAVENOUS
  Administered 2013-01-27: 100 ug via INTRAVENOUS
  Administered 2013-01-27: 50 ug via INTRAVENOUS

## 2013-01-27 MED ORDER — ONDANSETRON HCL 4 MG/2ML IJ SOLN: 4.0000 mg | Freq: Four times a day (QID) | INTRAMUSCULAR | Status: DC | PRN

## 2013-01-27 MED ORDER — DIPHENHYDRAMINE HCL 50 MG/ML IJ SOLN: 12.5000 mg | Freq: Four times a day (QID) | INTRAMUSCULAR | Status: DC | PRN

## 2013-01-27 MED ORDER — MIDAZOLAM HCL 5 MG/5ML IJ SOLN
INTRAMUSCULAR | Status: DC | PRN
  Administered 2013-01-27: 2 mg via INTRAVENOUS

## 2013-01-27 MED ORDER — GLYCOPYRROLATE 0.2 MG/ML IJ SOLN
INTRAMUSCULAR | Status: DC | PRN
  Administered 2013-01-27: 0.6 mg via INTRAVENOUS

## 2013-01-27 MED ORDER — MIDAZOLAM HCL 2 MG/2ML IJ SOLN
INTRAMUSCULAR | Status: AC
  Filled 2013-01-27: qty 2

## 2013-01-27 MED ORDER — GLYCOPYRROLATE 0.2 MG/ML IJ SOLN
INTRAMUSCULAR | Status: AC
  Filled 2013-01-27: qty 3

## 2013-01-27 MED ORDER — ROCURONIUM BROMIDE 100 MG/10ML IV SOLN
INTRAVENOUS | Status: DC | PRN
  Administered 2013-01-27: 10 mg via INTRAVENOUS
  Administered 2013-01-27: 20 mg via INTRAVENOUS
  Administered 2013-01-27: 40 mg via INTRAVENOUS
  Administered 2013-01-27 (×3): 10 mg via INTRAVENOUS

## 2013-01-27 MED ORDER — ACETAMINOPHEN 160 MG/5ML PO SOLN
1000.0000 mg | Freq: Four times a day (QID) | ORAL | Status: AC
  Filled 2013-01-27 (×4): qty 40

## 2013-01-27 MED ORDER — FENTANYL CITRATE 0.05 MG/ML IJ SOLN
INTRAMUSCULAR | Status: AC
  Filled 2013-01-27: qty 5

## 2013-01-27 MED ORDER — SENNOSIDES-DOCUSATE SODIUM 8.6-50 MG PO TABS
1.0000 | ORAL_TABLET | Freq: Every evening | ORAL | Status: DC | PRN
  Filled 2013-01-27: qty 1

## 2013-01-27 MED ORDER — LIDOCAINE HCL (CARDIAC) 20 MG/ML IV SOLN
INTRAVENOUS | Status: AC
  Filled 2013-01-27: qty 5

## 2013-01-27 MED ORDER — HYDROMORPHONE HCL PF 1 MG/ML IJ SOLN
0.2500 mg | INTRAMUSCULAR | Status: DC | PRN
Start: 2013-01-27 — End: 2013-01-27
  Administered 2013-01-27 (×4): 0.5 mg via INTRAVENOUS

## 2013-01-27 MED ORDER — NALOXONE HCL 0.4 MG/ML IJ SOLN: 0.4000 mg | INTRAMUSCULAR | Status: DC | PRN

## 2013-01-27 MED ORDER — LACTATED RINGERS IV SOLN
INTRAVENOUS | Status: DC | PRN
  Administered 2013-01-27 (×2): via INTRAVENOUS

## 2013-01-27 MED ORDER — OXYCODONE-ACETAMINOPHEN 5-325 MG PO TABS
1.0000 | ORAL_TABLET | ORAL | Status: DC | PRN
  Administered 2013-02-02 (×2): 2 via ORAL
  Filled 2013-01-27 (×3): qty 2

## 2013-01-27 MED ORDER — HYDROMORPHONE HCL PF 1 MG/ML IJ SOLN
INTRAMUSCULAR | Status: AC
  Filled 2013-01-27: qty 1

## 2013-01-27 MED ORDER — DEXTROSE 5 % IV SOLN: 30.0000 ug/min | INTRAVENOUS | Status: DC

## 2013-01-27 MED ORDER — LIDOCAINE HCL (PF) 1 % IJ SOLN
INTRAMUSCULAR | Status: AC
  Filled 2013-01-27: qty 30

## 2013-01-27 MED ORDER — EPINEPHRINE HCL 1 MG/ML IJ SOLN
INTRAMUSCULAR | Status: AC
  Filled 2013-01-27: qty 1

## 2013-01-27 MED ORDER — DIPHENHYDRAMINE HCL 12.5 MG/5ML PO ELIX
12.5000 mg | ORAL_SOLUTION | Freq: Four times a day (QID) | ORAL | Status: DC | PRN
  Filled 2013-01-27: qty 5

## 2013-01-27 MED ORDER — TALC 5 G PL SUSR
INTRAPLEURAL | Status: AC
  Filled 2013-01-27: qty 5

## 2013-01-27 MED ORDER — HYDROMORPHONE HCL PF 1 MG/ML IJ SOLN
INTRAMUSCULAR | Status: AC
  Administered 2013-01-27: 0.5 mg via INTRAVENOUS
  Filled 2013-01-27: qty 1

## 2013-01-27 MED ORDER — HEMOSTATIC AGENTS (NO CHARGE) OPTIME
TOPICAL | Status: DC | PRN
  Administered 2013-01-27: 1 via TOPICAL

## 2013-01-27 SURGICAL SUPPLY — 81 items
BAG DECANTER FOR FLEXI CONT (MISCELLANEOUS) IMPLANT
BLADE SURG 11 STRL SS (BLADE) ×5 IMPLANT
BRUSH CYTOL CELLEBRITY 1.5X140 (MISCELLANEOUS) ×5 IMPLANT
CANISTER SUCTION 2500CC (MISCELLANEOUS) ×5 IMPLANT
CATH KIT ON Q 5IN SLV (PAIN MANAGEMENT) IMPLANT
CATH ROBINSON RED A/P 22FR (CATHETERS) IMPLANT
CATH THORACIC 28FR (CATHETERS) ×5 IMPLANT
CATH THORACIC 36FR (CATHETERS) IMPLANT
CATH THORACIC 36FR RT ANG (CATHETERS) IMPLANT
CONT SPEC 4OZ CLIKSEAL STRL BL (MISCELLANEOUS) ×10 IMPLANT
COTTONBALL LRG STERILE PKG (GAUZE/BANDAGES/DRESSINGS) IMPLANT
COVER SURGICAL LIGHT HANDLE (MISCELLANEOUS) ×10 IMPLANT
COVER TABLE BACK 60X90 (DRAPES) IMPLANT
DERMABOND ADVANCED (GAUZE/BANDAGES/DRESSINGS) ×6
DERMABOND ADVANCED .7 DNX12 (GAUZE/BANDAGES/DRESSINGS) ×9 IMPLANT
DRAPE LAPAROSCOPIC ABDOMINAL (DRAPES) ×5 IMPLANT
DRAPE WARM FLUID 44X44 (DRAPE) ×5 IMPLANT
DRSG AQUACEL AG ADV 3.5X14 (GAUZE/BANDAGES/DRESSINGS) IMPLANT
ELECT REM PT RETURN 9FT ADLT (ELECTROSURGICAL) ×5
ELECTRODE REM PT RTRN 9FT ADLT (ELECTROSURGICAL) ×3 IMPLANT
FORCEPS BIOP RJ4 1.8 (CUTTING FORCEPS) IMPLANT
GLOVE BIO SURGEON STRL SZ 6.5 (GLOVE) ×4 IMPLANT
GLOVE BIO SURGEON STRL SZ7 (GLOVE) ×5 IMPLANT
GLOVE BIO SURGEON STRL SZ7.5 (GLOVE) ×10 IMPLANT
GLOVE BIO SURGEONS STRL SZ 6.5 (GLOVE) ×1
GLOVE BIOGEL PI IND STRL 7.0 (GLOVE) ×6 IMPLANT
GLOVE BIOGEL PI INDICATOR 7.0 (GLOVE) ×4
GLOVE ORTHO TXT STRL SZ7.5 (GLOVE) ×10 IMPLANT
GOWN STRL NON-REIN LRG LVL3 (GOWN DISPOSABLE) ×15 IMPLANT
HANDLE STAPLE ENDO GIA SHORT (STAPLE) ×2
KIT BASIN OR (CUSTOM PROCEDURE TRAY) ×5 IMPLANT
KIT ROOM TURNOVER OR (KITS) ×5 IMPLANT
KIT SUCTION CATH 14FR (SUCTIONS) ×5 IMPLANT
MARKER SKIN DUAL TIP RULER LAB (MISCELLANEOUS) IMPLANT
NEEDLE 22X1 1/2 (OR ONLY) (NEEDLE) IMPLANT
NEEDLE BIOPSY TRANSBRONCH 21G (NEEDLE) IMPLANT
NEEDLE BLUNT 18X1 FOR OR ONLY (NEEDLE) IMPLANT
NS IRRIG 1000ML POUR BTL (IV SOLUTION) ×15 IMPLANT
OIL SILICONE PENTAX (PARTS (SERVICE/REPAIRS)) ×5 IMPLANT
PACK CHEST (CUSTOM PROCEDURE TRAY) ×5 IMPLANT
PAD ARMBOARD 7.5X6 YLW CONV (MISCELLANEOUS) ×5 IMPLANT
RELOAD EGIA 45 MED/THCK PURPLE (STAPLE) ×15 IMPLANT
RELOAD EGIA 60 MED/THCK PURPLE (STAPLE) ×10 IMPLANT
SEALANT SURG COSEAL 4ML (VASCULAR PRODUCTS) ×5 IMPLANT
SOLUTION ANTI FOG 6CC (MISCELLANEOUS) ×5 IMPLANT
SPONGE GAUZE 4X4 12PLY (GAUZE/BANDAGES/DRESSINGS) ×5 IMPLANT
SPONGE GAUZE 4X4 12PLY STER LF (GAUZE/BANDAGES/DRESSINGS) ×5 IMPLANT
SPONGE TONSIL 1.25 RF SGL STRG (GAUZE/BANDAGES/DRESSINGS) ×5 IMPLANT
STAPLER ENDO GIA 12MM SHORT (STAPLE) ×3 IMPLANT
SUT CHROMIC 3 0 SH 27 (SUTURE) IMPLANT
SUT ETHILON 3 0 PS 1 (SUTURE) IMPLANT
SUT PROLENE 3 0 SH DA (SUTURE) IMPLANT
SUT PROLENE 4 0 RB 1 (SUTURE)
SUT PROLENE 4-0 RB1 .5 CRCL 36 (SUTURE) IMPLANT
SUT SILK  1 MH (SUTURE) ×4
SUT SILK 1 MH (SUTURE) ×6 IMPLANT
SUT SILK 2 0SH CR/8 30 (SUTURE) IMPLANT
SUT SILK 3 0SH CR/8 30 (SUTURE) IMPLANT
SUT VIC AB 1 CTX 18 (SUTURE) IMPLANT
SUT VIC AB 2 TP1 27 (SUTURE) IMPLANT
SUT VIC AB 2-0 CT2 18 VCP726D (SUTURE) IMPLANT
SUT VIC AB 2-0 CTX 36 (SUTURE) IMPLANT
SUT VIC AB 3-0 SH 18 (SUTURE) ×5 IMPLANT
SUT VIC AB 3-0 X1 27 (SUTURE) ×5 IMPLANT
SUT VICRYL 0 UR6 27IN ABS (SUTURE) ×15 IMPLANT
SUT VICRYL 2 TP 1 (SUTURE) IMPLANT
SWAB COLLECTION DEVICE MRSA (MISCELLANEOUS) IMPLANT
SYR 20ML ECCENTRIC (SYRINGE) ×5 IMPLANT
SYR 5ML LUER SLIP (SYRINGE) ×5 IMPLANT
SYR CONTROL 10ML LL (SYRINGE) IMPLANT
SYSTEM SAHARA CHEST DRAIN ATS (WOUND CARE) ×5 IMPLANT
TAPE CLOTH SURG 4X10 WHT LF (GAUZE/BANDAGES/DRESSINGS) ×5 IMPLANT
TIP APPLICATOR SPRAY EXTEND 16 (VASCULAR PRODUCTS) ×5 IMPLANT
TOWEL OR 17X24 6PK STRL BLUE (TOWEL DISPOSABLE) ×5 IMPLANT
TOWEL OR 17X26 10 PK STRL BLUE (TOWEL DISPOSABLE) ×10 IMPLANT
TRAP SPECIMEN MUCOUS 40CC (MISCELLANEOUS) ×5 IMPLANT
TRAY FOLEY CATH 16FRSI W/METER (SET/KITS/TRAYS/PACK) ×5 IMPLANT
TUBE ANAEROBIC SPECIMEN COL (MISCELLANEOUS) IMPLANT
TUBE CONNECTING 12'X1/4 (SUCTIONS) ×1
TUBE CONNECTING 12X1/4 (SUCTIONS) ×4 IMPLANT
WATER STERILE IRR 1000ML POUR (IV SOLUTION) ×10 IMPLANT

## 2013-01-27 NOTE — Progress Notes (Signed)
Report given to Lauren RN.

## 2013-01-27 NOTE — Progress Notes (Signed)
The patient was examined and preop studies reviewed. There has been no change from the prior exam and the patient is ready for surgery.  Plan videobronchoscopy and R VATS lung biopsy on [place] today

## 2013-01-27 NOTE — Progress Notes (Signed)
PULMONARY/CCM PROGRESS NOTE  Requesting MD/Service: Nishan/Cards Date of admission: 1/11 Date of consult: 1/12 Reason for consultation: abnormal CXR  BRIEF PATIENT DESCRIPTION:  53 yo female presented to [facility] on 1/9 with dyspnea, and transferred to [facility] on 1/11 for management of pericardial effusion.  PCCM consulted to assist with respiratory management.  SIGNIFICANT EVENTS: 1/09 Admit to [facility] 1/11 Transfer to [facility], pericardial drain placed >> cytology negative 1/12 PCCM consulted 1/13 pericardial drain removed 1/14 Transfer to PCCM service 1/15 Pleuritic chest pain 1/19 Added solumedrol 1/22 TCTS consulted 3/23 VATS bx, uncomplicated  STUDIES: 5/57 CT chest >> multifocal pneumonia, small Rt pleural effusion, multiple pulmonary nodules, mod pericardial effusion 1/11 Echo [facility]) >> large pericardial effusion with early tamponade 1/11 Echo >> EF 65-70%. Trivial residual pericardial effusion 1/11 Labs >> ESR 2, ANCA negative, RF < 10, anti CCP < 2, ANA negative 1/12 Quantiferon gold >> negative 1/14 Bronchoscopy >> normal airway exam, cytology shows severe 2nd to inflammation 1/16 Echocardiogram >> There was no pericardial effusion 1/20 CT chest >>  Trace pericardial fluid, consolidation RUL, Rt > Lt peribronchovascular nodules, small Rt effusion, lytic appearing lesin Lt fifth rib, lucency C6 vertebral body   CULTURES: Pericardial AFB 1/11 >> smear negative >>  HIV serology 1/12 >> negative Strep Ag 1/12 >> negative Legionella Ag 1/12 >> negative PCP 1/14>>> Sputum from FOB 1/14>>>NEG  Legionella from FOB 1/14>>> NEG  AFB from FOB 1/14 >> smear neg >>  Fungal from FOB 1/14 >> smear neg >>   ANTIBIOTICS: Vancomycin 1/11 >> 1/15 Zosyn 1/11 >> 1/11 Levaquin 1/11 >>   SUBJECTIVE: No problems post VATS.    OBJECTIVE: BP 143/85  Pulse 81  Temp(Src) 97.7 F (36.5 C) (Oral)  Resp 18  Ht _0  (1.676 m)   Wt 84.777 kg (186 lb 14.4 oz)  BMI 30.18 kg/m2  SpO2 93%  SpO2 96% on 4 lpm Frannie  EXAM:  Gen: no distress HEENT: no sinus tenderness Lungs: few scattered rhonchi, no audible wheeze, diminished bases  Cardiovascular: regular, no murmur Chest: tenderness on Lt chest Abdomen: Soft, non tender Ext: no edema Neuro: Normal strength Skin: no rashes  DATA:  CBC Recent Labs     01/26/13  1700  01/27/13  0505  WBC  18.2*  18.5*  HGB  13.5  13.4  HCT  39.6  38.7  PLT  322  332    BMET Recent Labs     01/25/13  0500  01/26/13  1700  01/27/13  0505  NA   --   138  138  K   --   4.4  4.7  CL   --   101  99  CO2   --   22  26  BUN   --   25*  23  CREATININE  0.61  0.70  0.58  GLUCOSE   --   172*  136*    Imaging Dg Chest Port 1 View  01/27/2013   CLINICAL DATA:  Status post right lung biopsy  EXAM: PORTABLE CHEST - 1 VIEW  COMPARISON:  01/26/2013  FINDINGS: No change in right PICC line. Right chest tube has been placed. There is a right internal jugular central line, with tip at the cavoatrial junction. There is no pneumothorax. Left lung is clear and the heart size is normal. On the right, there is increased severity of consolidation involving the mid to upper lung zones.  IMPRESSION: Increased severity of extensive consolidation on the right.  No pneumothorax. Lines and tubes as described.   Electronically Signed   By: Skipper Cliche M.D.   On: 01/27/2013 14:45   Dg Chest Port 1 View  01/26/2013   CLINICAL DATA:  Shortness of breath, pneumonia, edema.  EXAM: PORTABLE CHEST - 1 VIEW  COMPARISON:  DG CHEST 2 VIEW dated 01/25/2013; CT CHEST W/CM dated 01/21/2013; CT ANGIO CHEST dated 01/09/2013  FINDINGS: Trachea is midline. Heart size stable. Right PICC tip projects over the SVC. Patchy right upper lobe airspace consolidation. Diffuse bilateral nodularity in the lungs is better seen on 01/21/2013. No definite pleural fluid.  IMPRESSION: Right upper lobe airspace consolidation with diffuse  bilateral pulmonary nodularity, better seen and evaluated on 01/21/2013. Underlying malignancy is considered.   Electronically Signed   By: Lorin Picket M.D.   On: 01/26/2013 08:47     ASSESSMENT/PLAN:  A: Rt upper lung consolidation/mass with Rt > Lt multiple pulmonary nodules, ?lytic lesion Lt 5th rib, and possible lesion C-spine.  Former smoker. Unusual appearance for community acquired PNA.  No risks factors for atypical infections(ie blastomycosis, nocardia).  Concern now stronger for malignancy. (1/22) After discussion w/ TCTS & IR- plan is for Bronch/VATS 01/27/13 by TCTS... P: -continue solumedrol 80 Q12h -Continue levaquin 750 mg -Tussionex prn -VATS bx 1/27 per CVTS - f/u path   A: Pericardial effusion s/p drain. P: - Echo 1/23 showed norm LV size 7 function w/ EF=55-60%, Gr1DD, valves ok, triv peric fluid remains...   A: Pleuritic chest pain. P: -continue IV solumedrol for now  -continue anti-tussive medications  A: Acute hypoxic respiratory failure P: -oxygen as needed to keep SpO2 > 92%    Summary:     *Care during the described time interval was provided by me and/or other providers on the critical care team. I have reviewed this patient's available data, including medical history, events of note, physical examination and test results as part of my evaluation.    Merton Border, MD ; [facility] [phone].  After 5:30 PM or weekends, call [phone]

## 2013-01-27 NOTE — Anesthesia Preprocedure Evaluation (Signed)
Anesthesia Evaluation  Patient identified by MRN, date of birth, ID band Patient awake    Reviewed: Allergy & Precautions, H&P , NPO status , Patient's Chart, lab work & pertinent test results  Airway Mallampati: II      Dental   Pulmonary former smoker,          Cardiovascular negative cardio ROS      Neuro/Psych    GI/Hepatic negative GI ROS, Neg liver ROS,   Endo/Other  negative endocrine ROS  Renal/GU negative Renal ROS     Musculoskeletal   Abdominal   Peds  Hematology   Anesthesia Other Findings   Reproductive/Obstetrics                           Anesthesia Physical Anesthesia Plan  ASA: III  Anesthesia Plan: General   Post-op Pain Management:    Induction: Intravenous  Airway Management Planned: Double Lumen EBT  Additional Equipment: Arterial line and CVP  Intra-op Plan:   Post-operative Plan: Possible Post-op intubation/ventilation  Informed Consent: I have reviewed the patients History and Physical, chart, labs and discussed the procedure including the risks, benefits and alternatives for the proposed anesthesia with the patient or authorized representative who has indicated his/her understanding and acceptance.   Dental advisory given  Plan Discussed with: CRNA, Anesthesiologist and Surgeon  Anesthesia Plan Comments:         Anesthesia Quick Evaluation

## 2013-01-27 NOTE — Anesthesia Postprocedure Evaluation (Signed)
  Anesthesia Post-op Note  Patient: [name]  Procedure(s) Performed: Procedure(s): VIDEO BRONCHOSCOPY (N/A) VIDEO ASSISTED THORACOSCOPY (Right) LUNG BIOPSY (Right)  Patient Location: PACU  Anesthesia Type:General  Level of Consciousness: awake  Airway and Oxygen Therapy: Patient Spontanous Breathing  Post-op Pain: mild  Post-op Assessment: Post-op Vital signs reviewed  Post-op Vital Signs: Reviewed  Complications: No apparent anesthesia complications

## 2013-01-27 NOTE — Anesthesia Procedure Notes (Addendum)
Procedure Name: Intubation Date/Time: 01/27/2013 12:11 PM Performed by: Rush Farmer E Pre-anesthesia Checklist: Patient identified, Emergency Drugs available, Suction available, Patient being monitored and Timeout performed Patient Re-evaluated:Patient Re-evaluated prior to inductionOxygen Delivery Method: Circle system utilized Preoxygenation: Pre-oxygenation with 100% oxygen Intubation Type: IV induction Ventilation: Mask ventilation without difficulty Laryngoscope Size: Mac and 3 Grade View: Grade I Endobronchial tube: Left, Double lumen EBT, EBT position confirmed by auscultation and EBT position confirmed by fiberoptic bronchoscope and 37 Fr Number of attempts: 1 Airway Equipment and Method: Stylet Placement Confirmation: ETT inserted through vocal cords under direct vision,  positive ETCO2 and breath sounds checked- equal and bilateral Tube secured with: Tape Dental Injury: Teeth and Oropharynx as per pre-operative assessment    RIJ CVP Dual Lumen [id number]: The patient was identified and consent obtained.  TO was performed, and full barrier precautions were used.  The skin was anesthetized with lidocaine.  Once the vein was located with the 22 ga. needle using ultrasound guidance , the wire was inserted into the vein.  The wire location was confirmed with ultrasound.  The tissue was dilated and the catheter was carefully inserted, then sutured in place. A dressing was applied. The patient tolerated the procedure well.  C CE

## 2013-01-27 NOTE — Progress Notes (Signed)
Bronchoscopy   Right VATS

## 2013-01-27 NOTE — Preoperative (Signed)
Beta Blockers   Reason not to administer Beta Blockers:Not Applicable 

## 2013-01-27 NOTE — Progress Notes (Signed)
Pt going down to short stay for surgery. Consent received and signed. Pre-op checklist completed. CHG bath given to pt, SCD's on bed, New gown given to pt. Report called and given to Sonia Baller in [place]  [facility]. Pt removed all jewelry and given to family including glasses, earrings and rings. Pt transported off unit via bed with family at side.

## 2013-01-27 NOTE — Brief Op Note (Signed)
01/11/2013 - 01/27/2013  1:31 PM  PATIENT:  [name]  53 y.o. female  PRE-OPERATIVE DIAGNOSIS:  Right Upper Lobe Pneumonia  POST-OPERATIVE DIAGNOSIS:  Right Upper Lobe Pneumonia  PROCEDURE:  Procedure(s):  VIDEO BRONCHOSCOPY (N/A)  VIDEO ASSISTED THORACOSCOPY  - Lung Biopsy Right Upper Lobe, Right Middle Lobe  SURGEON:  Surgeon(s) and Role:    * Ivin Poot, MD - Primary  PHYSICIAN ASSISTANT: Ellwood Handler PA-C  ANESTHESIA:   general  EBL:  Total I/O In: 1200 [I.V.:1200] Out: -   BLOOD ADMINISTERED:none  DRAINS: Right Pleural Chest Tube   LOCAL MEDICATIONS USED:  NONE  SPECIMEN:  Source of Specimen:  Right Upper Lobe, Right Middle Lobe  DISPOSITION OF SPECIMEN:  PATHOLOGY  COUNTS:  YES  TOURNIQUET:  * No tourniquets in log *  DICTATION: .Dragon Dictation  PLAN OF CARE: Admit to inpatient   PATIENT DISPOSITION:  PACU - hemodynamically stable.   Delay start of Pharmacological VTE agent (>24hrs) due to surgical blood loss or risk of bleeding: yes

## 2013-01-27 NOTE — Transfer of Care (Signed)
Immediate Anesthesia Transfer of Care Note  Patient: [name]  Procedure(s) Performed: Procedure(s): VIDEO BRONCHOSCOPY (N/A) VIDEO ASSISTED THORACOSCOPY (Right) LUNG BIOPSY (Right)  Patient Location: PACU  Anesthesia Type:General  Level of Consciousness: awake, alert  and oriented  Airway & Oxygen Therapy: Patient Spontanous Breathing and Patient connected to face mask oxygen  Post-op Assessment: Report given to PACU RN, Post -op Vital signs reviewed and stable and Patient moving all extremities X 4  Post vital signs: Reviewed and stable  Complications: No apparent anesthesia complications

## 2013-01-28 ENCOUNTER — Inpatient Hospital Stay (HOSPITAL_COMMUNITY): Payer: Medicaid Other

## 2013-01-28 DIAGNOSIS — C779 Secondary and unspecified malignant neoplasm of lymph node, unspecified: Secondary | ICD-10-CM

## 2013-01-28 DIAGNOSIS — C782 Secondary malignant neoplasm of pleura: Secondary | ICD-10-CM

## 2013-01-28 DIAGNOSIS — C50919 Malignant neoplasm of unspecified site of unspecified female breast: Secondary | ICD-10-CM

## 2013-01-28 LAB — BASIC METABOLIC PANEL
BUN: 20 mg/dL (ref 6–23)
CALCIUM: 8.2 mg/dL — AB (ref 8.4–10.5)
CO2: 29 mEq/L (ref 19–32)
CREATININE: 0.71 mg/dL (ref 0.50–1.10)
Chloride: 99 mEq/L (ref 96–112)
GFR calc Af Amer: >90 mL/min (ref >90)
GFR calc non Af Amer: >90 mL/min (ref >90)
GLUCOSE: 97 mg/dL (ref 70–99)
Potassium: 4.6 mEq/L (ref 3.7–5.3)
Sodium: 140 mEq/L (ref 137–147)

## 2013-01-28 LAB — CBC
HEMATOCRIT: 38.5 % (ref 36.0–46.0)
Hemoglobin: 12.8 g/dL (ref 12.0–15.0)
MCH: 29.8 pg (ref 26.0–34.0)
MCHC: 33.2 g/dL (ref 30.0–36.0)
MCV: 89.7 fL (ref 78.0–100.0)
Platelets: 276 10*3/uL (ref 150–400)
RBC: 4.29 MIL/uL (ref 3.87–5.11)
RDW: 13.4 % (ref 11.5–15.5)
WBC: 15.8 10*3/uL — ABNORMAL HIGH (ref 4.0–10.5)

## 2013-01-28 LAB — BLOOD GAS, ARTERIAL
Acid-Base Excess: 7.7 mmol/L — ABNORMAL HIGH (ref 0.0–2.0)
Bicarbonate: 32.3 mEq/L — ABNORMAL HIGH (ref 20.0–24.0)
Drawn by: 331761
FIO2: 0.21 %
O2 Saturation: 92.4 %
PATIENT TEMPERATURE: 98.6
PO2 ART: 63.9 mmHg — AB (ref 80.0–100.0)
TCO2: 33.8 mmol/L (ref 0–100)
pCO2 arterial: 50.7 mmHg — ABNORMAL HIGH (ref 35.0–45.0)
pH, Arterial: 7.42 (ref 7.350–7.450)

## 2013-01-28 MED ORDER — ALBUTEROL SULFATE (2.5 MG/3ML) 0.083% IN NEBU: 2.5000 mg | INHALATION_SOLUTION | Freq: Four times a day (QID) | RESPIRATORY_TRACT | Status: DC | PRN

## 2013-01-28 MED ORDER — PREDNISONE 20 MG PO TABS
40.0000 mg | ORAL_TABLET | Freq: Every day | ORAL | Status: DC
  Administered 2013-01-29 – 2013-02-02 (×5): 40 mg via ORAL
  Filled 2013-01-28 (×7): qty 2

## 2013-01-28 MED ORDER — ENSURE COMPLETE PO LIQD
237.0000 mL | Freq: Two times a day (BID) | ORAL | Status: DC
  Administered 2013-01-28 – 2013-02-02 (×9): 237 mL via ORAL

## 2013-01-28 MED ORDER — ALBUTEROL SULFATE (2.5 MG/3ML) 0.083% IN NEBU
2.5000 mg | INHALATION_SOLUTION | Freq: Once | RESPIRATORY_TRACT | Status: AC
  Administered 2013-01-28: 2.5 mg via RESPIRATORY_TRACT
  Filled 2013-01-28: qty 3

## 2013-01-28 NOTE — Progress Notes (Signed)
Notified Md at [place] regarding patient having bursts of apparent SVT/ Atrial Tach up to the 140's and Patient Hr sustaining Sinus tach in 100's to 110's will continue to monitor closely. Patient with no complaints at this time resting comfortably.

## 2013-01-28 NOTE — Progress Notes (Signed)
Utilization review completed.  

## 2013-01-28 NOTE — Progress Notes (Signed)
PULMONARY/CCM PROGRESS NOTE  Requesting MD/Service: Nishan/Cards Date of admission: 1/11 Date of consult: 1/12 Reason for consultation: abnormal CXR  BRIEF PATIENT DESCRIPTION:  53 yo female presented to [facility] on 1/9 with dyspnea, and transferred to [facility] on 1/11 for management of pericardial effusion.  PCCM consulted to assist with respiratory management.  SIGNIFICANT EVENTS: 1/09 Admit to [facility] 1/11 Transfer to [facility], pericardial drain placed >> cytology negative 1/12 PCCM consulted 1/13 pericardial drain removed 1/14 Transfer to PCCM service 1/15 Pleuritic chest pain 1/19 Added solumedrol 1/22 TCTS consulted 4/97 VATS bx, uncomplicated  STUDIES: 5/30 CT chest >> multifocal pneumonia, small Rt pleural effusion, multiple pulmonary nodules, mod pericardial effusion 1/11 Echo [facility]) >> large pericardial effusion with early tamponade 1/11 Echo >> EF 65-70%. Trivial residual pericardial effusion 1/11 Labs >> ESR 2, ANCA negative, RF < 10, anti CCP < 2, ANA negative 1/12 Quantiferon gold >> negative 1/14 Bronchoscopy >> normal airway exam, cytology shows severe 2nd to inflammation 1/16 Echocardiogram >> There was no pericardial effusion 1/20 CT chest >>  Trace pericardial fluid, consolidation RUL, Rt > Lt peribronchovascular nodules, small Rt effusion, lytic appearing lesin Lt fifth rib, lucency C6 vertebral body  1/27  CULTURES: Pericardial AFB 1/11 >> smear negative >>  HIV serology 1/12 >> negative Strep Ag 1/12 >> negative Legionella Ag 1/12 >> negative PCP 1/14>>> Sputum from FOB 1/14>>>NEG  Legionella from FOB 1/14>>> NEG  AFB from FOB 1/14 >> smear neg >>  Fungal from FOB 1/14 >> smear neg >>   ANTIBIOTICS: Vancomycin 1/11 >> 1/15 Zosyn 1/11 >> 1/11 Levaquin 1/11 >>   SUBJECTIVE/ Interval:   OBJECTIVE: BP 97/65  Pulse 71  Temp(Src) 98 F (36.7 C) (Oral)  Resp 13  Ht 5' 6"  (1.676 m)  Wt 84.777  kg (186 lb 14.4 oz)  BMI 30.18 kg/m2  SpO2 91%  SpO2 96% on 4 lpm [place]  EXAM:  Gen: no distress HEENT: no sinus tenderness Lungs: few scattered rhonchi, no audible wheeze, diminished bases  Cardiovascular: regular, no murmur Chest: tenderness on Lt chest Abdomen: Soft, non tender Ext: no edema Neuro: Normal strength Skin: no rashes  DATA:  CBC Recent Labs     01/26/13  1700  01/27/13  0505  01/28/13  0421  WBC  18.2*  18.5*  15.8*  HGB  13.5  13.4  12.8  HCT  39.6  38.7  38.5  PLT  322  332  276    BMET Recent Labs     01/26/13  1700  01/27/13  0505  01/28/13  0421  NA  138  138  140  K  4.4  4.7  4.6  CL  101  99  99  CO2  22  26  29   BUN  25*  23  20  CREATININE  0.70  0.58  0.71  GLUCOSE  172*  136*  97    Imaging Dg Chest Port 1 View  01/28/2013   CLINICAL DATA:  Chest soreness.  Chest tube.  EXAM: PORTABLE CHEST - 1 VIEW  COMPARISON:  01/27/2013  FINDINGS: Right chest tube tip lies at the right apex, stable. No convincing pneumothorax.  Right PICC and right internal jugular central venous line are both stable.  Dense consolidation is centered on the right mid lung in the right upper lobe abutting the minor fissure. Allowing for differences in patient positioning and lung volume, this is unchanged. Left lung is essentially clear.  Cardiac silhouette is normal in size.  Normal mediastinal contour.  IMPRESSION: 1. Stable right chest tube.  No pneumothorax. 2. Persistent extensive right lung consolidation centered on the lower right upper lobe, consistent with pneumonia. Underlying neoplasm comment suggested on the prior CT, remains in the differential diagnosis.   Electronically Signed   By: Lajean Manes M.D.   On: 01/28/2013 08:42   Dg Chest Port 1 View  01/27/2013   CLINICAL DATA:  Status post right lung biopsy  EXAM: PORTABLE CHEST - 1 VIEW  COMPARISON:  01/26/2013  FINDINGS: No change in right PICC line. Right chest tube has been placed. There is a right  internal jugular central line, with tip at the cavoatrial junction. There is no pneumothorax. Left lung is clear and the heart size is normal. On the right, there is increased severity of consolidation involving the mid to upper lung zones.  IMPRESSION: Increased severity of extensive consolidation on the right. No pneumothorax. Lines and tubes as described.   Electronically Signed   By: Skipper Cliche M.D.   On: 01/27/2013 14:45     ASSESSMENT/PLAN:  A: Rt upper lung consolidation/mass with Rt > Lt multiple pulmonary nodules, ?lytic lesion Lt 5th rib, and possible lesion C-spine.  Former smoker. - s/p VATS bx on 1/27 > path consistent with metastatic adenoCA P: -change to pred and taper steroids -stop levaquin 750 mg 1/28 -Tussionex prn -will ask oncology to see. She may want to get her therapy at [place] which is closer to home.   A: Pericardial effusion s/p drain. P: - Echo 1/23 showed norm LV size 7 function w/ EF=55-60%, Gr1DD, valves ok, triv peric fluid remains...   A: Pleuritic chest pain. P: -change solumedrol to pred and taper -continue anti-tussive medications  A: Acute hypoxic respiratory failure P: -oxygen as needed to keep SpO2 > 92%   Baltazar Apo, MD, PhD 01/28/2013, 11:10 AM  Pulmonary and Critical Care [phone] or if no answer [phone]

## 2013-01-28 NOTE — Op Note (Signed)
[name], [name] NO.:  [id number]  MEDICAL RECORD NO.:  [id number]  LOCATION:                               FACILITY:  [facility]  PHYSICIAN:  Ivin Poot, M.D.  DATE OF BIRTH:  1960/09/23  DATE OF PROCEDURE:  01/27/2013 DATE OF DISCHARGE:                              OPERATIVE REPORT   OPERATION: 1. Video bronchoscopy with washings and brushings of right upper lobe. 2. Right VATS (video-assisted thoracoscopic surgery) with biopsy of     right upper lobe and right middle lobes.  PREOPERATIVE DIAGNOSIS:  Interstitial pneumonitis, right upper lobe.  POSTOPERATIVE DIAGNOSIS:  Interstitial pneumonitis, right upper lobe.  SURGEON:  Ivin Poot, M.D.  ASSISTANT:  Providence Crosby, PA-C.  ANESTHESIA:  General by Dr. Finis Bud.  INDICATIONS:  The patient is a 53 year old Caucasian female who was admitted to the hospital after few weeks of progressive respiratory symptoms.  She was admitted with a dense infiltrative density- consolidation of the right upper lobe and was placed first on broad- spectrum antibiotics.  Cultures were unrevealing and bronchoscopy at the bedside was unrevealing.  Her shortness of breath, cough, and x-ray findings did not improve and open lung biopsy was requested.  Prior to surgery, discussed the procedure of bronchoscopy and VATS, and lung biopsy with the patient including the use of general anesthesia, the location of the surgical incisions, the use of a postoperative chest tube drainage system, and the expected postoperative recovery.  I discussed with the patient the risks of the procedure including the risks of bleeding, blood transfusion requirement, prolonged air leak requiring chest tube, ventilatory failure requiring mechanical ventilation, and/or tracheostomy, and death.  After reviewing these issues, she demonstrated her understanding and agreed to proceed with surgery under what I felt was an informed  consent.  OPERATIVE FINDINGS: 1. Fairly clean bronchoscopy of the right lung, right upper lobe. 2. Normal appearance of the visceral pleura of the lung, but with some     increased thickness and fibrosis of the lung tissue noted during     wedge stapling-biopsies.  OPERATIVE PROCEDURE:  The patient was brought to the operating room and placed supine on the operating table.  General anesthesia was induced. A proper time-out was performed.  Through the endotracheal tube, a fiberoptic bronchoscope was passed. The distal trachea and carina were normal.  The left mainstem bronchus was visualized and was normal.  The segments of the left upper lobe and left lower lobe were visualized and had no endobronchial lesions or abnormalities.  The bronchoscope was then passed down the right mainstem bronchus.  This was normal without endobronchial abnormalities.  The right upper lobe segments were visualized.  The right upper lobe had some mild erythema and some slight nodularity of the mucosa, but otherwise were unremarkable.  There were no secretions.  Washings and brushings of the right upper lobe were taken and submitted for pathology, cytology, and cultures.  The bronchoscope was then withdrawn after examining the right middle lobe and right lower lobe endobronchial segments, which showed no abnormalities.  The patient was then reintubated with a double-lumen tube, repositioned with right side up, and prepped and draped  as a sterile field.  A proper time-out was performed.  Three small incisions were made around the circumference of the right hemithorax starting at the distal tip of the scapula to the anterior axillary line and the posterior border of the right scapula.  The VATS camera was inserted.  The lung was not very well deflated and additional maneuvers by Anesthesia were performed to deflate the right lung.  The surface of the lung was inspected with the camera and had some mild  anthracotic changes, but no nodularity, masses, or erythema or adhesions.  With the 2 side ports, VATS instruments were inserted including the Endo- GIA stapling devices and Endo-GIA stapling biopsies of the right upper lobe and right middle lobe were performed.  The staple lines were inspected and were intact and had no bleeding.  The staple lines were reinforced with a light coat of medical adhesive - Coseal.  There were no abnormal mediastinal lymph nodes identified for biopsy.  A 28-French chest tube was placed through a small incision in the anterior axillary line, and directed to the apex.  The lungs were inflated under direct vision.  The VATS portal incisions were then closed in layers using Vicryl.  The chest tube was connected to an underwater Pleur-Evac drainage system.  The patient was then turned back supine and the plan was to extubate the patient in the operating room and return to recovery room.  No known complications.  No blood transfusion required.  Blood loss was minimal.     Ivin Poot, M.D.     PV/MEDQ  D:  01/27/2013  T:  01/28/2013  Job:  [id number]  cc:   Rigoberto Noel, MD

## 2013-01-28 NOTE — Progress Notes (Addendum)
INITIAL NUTRITION ASSESSMENT  DOCUMENTATION CODES Per approved criteria  -Obesity Unspecified   INTERVENTION: Chocolate Ensure Complete po BID, each supplement provides 350 kcal and 13 grams of protein RD to follow for nutrition care plan  NUTRITION DIAGNOSIS: Increased nutrient needs related to catabolic illness as evidenced by estimated nutrition needs  Goal: Pt to meet >/= 90% of their estimated nutrition needs   Monitor:  PO & supplemental intake, weight, labs, I/O's  Reason for Assessment: Health History  53 y.o. female  Admitting Dx: shortness of breath  ASSESSMENT: Patient presented with shortness of breath and cough over several weeks; CT demonstrated large effusion with echo signs of cardiac tamponade; transferred emergently for pericardiocentesis.  Patient with extensive pulmonary infiltrates R>L with consolidation -- worrisome for malignancy.  Patient s/p procedures 1/27: VIDEO BRONCHOSCOPY  VIDEO ASSISTED THORACOSCOPY (Right)  LUNG BIOPSY (Right)  Patient reports her appetite is fairly good.  PO intake variable per flowsheet records at 50-100%.  No reported weight loss.  Patient with increased kcal, protein needs given post-op/catabolic state.  Amenable to chocolate Ensure supplement.  RD to order.  Nutrition focused physical exam completed.  No muscle or subcutaneous fat depletion noticed.  Height: Ht Readings from Last 1 Encounters:  01/11/13 5\' 6"  (1.676 m)    Weight: Wt Readings from Last 1 Encounters:  01/25/13 186 lb 14.4 oz (84.777 kg)    Ideal Body Weight: 130 lb  % Ideal Body Weight: 143%  Wt Readings from Last 10 Encounters:  01/25/13 186 lb 14.4 oz (84.777 kg)  01/25/13 186 lb 14.4 oz (84.777 kg)  01/25/13 186 lb 14.4 oz (84.777 kg)  01/25/13 186 lb 14.4 oz (84.777 kg)  01/25/13 186 lb 14.4 oz (84.777 kg)    Usual Body Weight: ---  % Usual Body Weight: ---  BMI:  Body mass index is 30.18 kg/(m^2).  Estimated Nutritional  Needs: Kcal: 1850-2050 Protein: 95-105 gm Fluid: 1.8-2.0 L  Skin: chest surgical incision   Diet Order: General  EDUCATION NEEDS: -No education needs identified at this time   Intake/Output Summary (Last 24 hours) at 01/28/13 1331 Last data filed at 01/28/13 1100  Gross per 24 hour  Intake 1696.67 ml  Output   2746 ml  Net -1049.33 ml    Labs:   Recent Labs Lab 01/26/13 1700 01/27/13 0505 01/28/13 0421  NA 138 138 140  K 4.4 4.7 4.6  CL 101 99 99  CO2 22 26 29   BUN 25* 23 20  CREATININE 0.70 0.58 0.71  CALCIUM 8.4 8.7 8.2*  GLUCOSE 172* 136* 97    Scheduled Meds: . acetaminophen  1,000 mg Oral Q6H   Or  . acetaminophen (TYLENOL) oral liquid 160 mg/5 mL  1,000 mg Oral Q6H  . bisacodyl  10 mg Oral Daily  . enoxaparin (LOVENOX) injection  40 mg Subcutaneous Q24H  . fentaNYL   Intravenous Q4H  . pantoprazole  40 mg Oral BID AC  . [START ON 01/29/2013] predniSONE  40 mg Oral Q breakfast  . sodium chloride  3 mL Intravenous Q12H    Continuous Infusions: . sodium chloride 20 mL/hr at 01/24/13 2027  . dextrose 5 % and 0.45% NaCl 100 mL/hr at 01/28/13 1100    Past Medical History  Diagnosis Date  . Pneumonia     Past Surgical History  Procedure Laterality Date  . Video bronchoscopy Bilateral 01/14/2013    Procedure: VIDEO BRONCHOSCOPY WITHOUT FLUORO;  Surgeon: Wilhelmina Mcardle, MD;  Location: [facility] ENDOSCOPY;  Service: Cardiopulmonary;  Laterality: Bilateral;    Arthur Holms, RD, LDN Pager #: [phone] After-Hours Pager #: [phone]
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
## 2013-01-28 NOTE — Progress Notes (Addendum)
      [address]       [place] [address]             [phone]       1 Day Post-Op Procedure(s) (LRB): VIDEO BRONCHOSCOPY (N/A) VIDEO ASSISTED THORACOSCOPY (Right) LUNG BIOPSY (Right)  Subjective: Patient with incisional pain, but PCA has kept it under fairly good control.  Objective: Vital signs in last 24 hours: Temp:  [97.7 F (36.5 C)-98.1 F (36.7 C)] 98 F (36.7 C) (01/28 0754) Pulse Rate:  [59-90] 71 (01/28 0754) Cardiac Rhythm:  [-] Normal sinus rhythm (01/28 0754) Resp:  [7-18] 13 (01/28 0754) BP: (97-171)/(63-101) 97/65 mmHg (01/28 0754) SpO2:  [87 %-99 %] 91 % (01/28 0754) Arterial Line BP: (88-182)/(67-110) 124/67 mmHg (01/28 0754)      Intake/Output from previous day: 01/27 0701 - 01/28 0700 In: 1320 [P.O.:120; I.V.:1200] Out: 2321 [Urine:2200; Blood:29; Chest Tube:92]   Physical Exam:  Cardiovascular: RRR, no murmurs, gallops, or rubs. Pulmonary: Clear to auscultation on left and slightly diminished at right apex; no rales, wheezes, or rhonchi. Abdomen: Soft, non tender, bowel sounds present. Extremities:Trace bilateral lower extremity edema. Wounds: Dressing is clean and dry.  Chest Tube: to suction and no air leak  Lab Results: CBC: Recent Labs  01/27/13 0505 01/28/13 0421  WBC 18.5* 15.8*  HGB 13.4 12.8  HCT 38.7 38.5  PLT 332 276   BMET:  Recent Labs  01/27/13 0505 01/28/13 0421  NA 138 140  K 4.7 4.6  CL 99 99  CO2 26 29  GLUCOSE 136* 97  BUN 23 20  CREATININE 0.58 0.71  CALCIUM 8.7 8.2*    PT/INR:  Recent Labs  01/26/13 1700  LABPROT 13.8  INR 1.08   ABG:  INR: Will add last result for INR, ABG once components are confirmed Will add last 4 CBG results once components are confirmed  Assessment/Plan:  1. CV - SR 2.  Pulmonary - Chest tubes with about 150 cc of output since surgery. There is no air leak. Likely place to water seal. CXR shows no pneumothorax, extensive right lung  consolidation of RUL , left lung mostly clear. Encourage incentive spirometer. On a couple liters of oxygen via [place]. Pathology discussed by Dr. Prescott Gum. Encourage incentive spirometer and flutter valve. Albuterol nebs PRN 3. GI-advance diet as tolerates and decrease IVF after lunch 4. Remove a line 5.ID- On Levaquin. Per pumonary  ZIMMERMAN,DONIELLE MPA-C 01/28/2013,9:49 AM

## 2013-01-29 ENCOUNTER — Inpatient Hospital Stay (HOSPITAL_COMMUNITY): Payer: Medicaid Other

## 2013-01-29 ENCOUNTER — Encounter (HOSPITAL_COMMUNITY): Payer: Self-pay | Admitting: Cardiothoracic Surgery

## 2013-01-29 DIAGNOSIS — C3490 Malignant neoplasm of unspecified part of unspecified bronchus or lung: Secondary | ICD-10-CM

## 2013-01-29 LAB — COMPREHENSIVE METABOLIC PANEL
ALT: 21 U/L (ref 0–35)
AST: 13 U/L (ref 0–37)
Albumin: 2.3 g/dL — ABNORMAL LOW (ref 3.5–5.2)
Alkaline Phosphatase: 79 U/L (ref 39–117)
BUN: 23 mg/dL (ref 6–23)
CALCIUM: 7.9 mg/dL — AB (ref 8.4–10.5)
CO2: 31 mEq/L (ref 19–32)
Chloride: 101 mEq/L (ref 96–112)
Creatinine, Ser: 0.62 mg/dL (ref 0.50–1.10)
GFR calc Af Amer: >90 mL/min (ref >90)
GFR calc non Af Amer: >90 mL/min (ref >90)
Glucose, Bld: 142 mg/dL — ABNORMAL HIGH (ref 70–99)
Potassium: 4.2 mEq/L (ref 3.7–5.3)
SODIUM: 140 meq/L (ref 137–147)
TOTAL PROTEIN: 4.8 g/dL — AB (ref 6.0–8.3)
Total Bilirubin: 0.3 mg/dL (ref 0.3–1.2)

## 2013-01-29 LAB — CBC
HCT: 37.2 % (ref 36.0–46.0)
Hemoglobin: 12.4 g/dL (ref 12.0–15.0)
MCH: 30.3 pg (ref 26.0–34.0)
MCHC: 33.3 g/dL (ref 30.0–36.0)
MCV: 91 fL (ref 78.0–100.0)
PLATELETS: 246 10*3/uL (ref 150–400)
RBC: 4.09 MIL/uL (ref 3.87–5.11)
RDW: 13.6 % (ref 11.5–15.5)
WBC: 14.7 10*3/uL — ABNORMAL HIGH (ref 4.0–10.5)

## 2013-01-29 LAB — TYPE AND SCREEN
ABO/RH(D): O POS
Antibody Screen: NEGATIVE
Unit division: 0
Unit division: 0

## 2013-01-29 NOTE — Progress Notes (Signed)
PULMONARY/CCM PROGRESS NOTE  Requesting MD/Service: Nishan/Cards Date of admission: 1/11 Date of consult: 1/12 Reason for consultation: abnormal CXR  BRIEF PATIENT DESCRIPTION:  53 yo female presented to [facility] on 1/9 with dyspnea, and transferred to [facility] on 1/11 for management of pericardial effusion.  PCCM consulted to assist with respiratory management.  SIGNIFICANT EVENTS: 1/09 Admit to [facility] 1/11 Transfer to [facility], pericardial drain placed >> cytology negative 1/12 PCCM consulted 1/13 pericardial drain removed 1/14 Transfer to PCCM service 1/15 Pleuritic chest pain 1/19 Added solumedrol 1/22 TCTS consulted 9/02 VATS bx, uncomplicated  STUDIES: 4/09 CT chest >> multifocal pneumonia, small Rt pleural effusion, multiple pulmonary nodules, mod pericardial effusion 1/11 Echo [facility]) >> large pericardial effusion with early tamponade 1/11 Echo >> EF 65-70%. Trivial residual pericardial effusion 1/11 Labs >> ESR 2, ANCA negative, RF < 10, anti CCP < 2, ANA negative 1/12 Quantiferon gold >> negative 1/14 Bronchoscopy >> normal airway exam, cytology shows severe 2nd to inflammation 1/16 Echocardiogram >> There was no pericardial effusion 1/20 CT chest >>  Trace pericardial fluid, consolidation RUL, Rt > Lt peribronchovascular nodules, small Rt effusion, lytic appearing lesin Lt fifth rib, lucency C6 vertebral body    CULTURES: Pericardial AFB 1/11 >> smear negative >>  HIV serology 1/12 >> negative Strep Ag 1/12 >> negative Legionella Ag 1/12 >> negative PCP 1/14>>> Sputum from FOB 1/14>>>NEG  Legionella from FOB 1/14>>> NEG  AFB from FOB 1/14 >> smear neg >>  Fungal from FOB 1/14 >> smear neg >>  AFB 1/27>> Fungal from 1/227>>   ANTIBIOTICS: Vancomycin 1/11 >> 1/15 Zosyn 1/11 >> 1/11 Levaquin 1/11 >> 1/28  SUBJECTIVE/ Interval: C/o pain r/t VATS, states that PCA and vicodin only take her to about 7-8/10.  Feels like she is breathing fine, mild hemoptysis.  OBJECTIVE: BP 130/90  Pulse 93  Temp(Src) 97.6 F (36.4 C) (Oral)  Resp 21  Ht _0  (1.676 m)  Wt 84.777 kg (186 lb 14.4 oz)  BMI 30.18 kg/m2  SpO2 95%  SpO2 92% on RA while eating breakfast.  EXAM:  Gen: no distress HEENT: NCAT, PERRL Lungs: few scattered rhonchi, no audible wheeze, diminished bases  Cardiovascular: tachy, regular, no murmur Chest: tenderness on Lt chest Abdomen: Soft, non tender Ext: no edema Neuro: Normal strength Skin: no rashes  DATA:  CBC Recent Labs     01/27/13  0505  01/28/13  0421  01/29/13  0516  WBC  18.5*  15.8*  14.7*  HGB  13.4  12.8  12.4  HCT  38.7  38.5  37.2  PLT  332  276  246    BMET Recent Labs     01/27/13  0505  01/28/13  0421  01/29/13  0516  NA  138  140  140  K  4.7  4.6  4.2  CL  99  99  101  CO2  _1 BUN  _2 CREATININE  0.58  0.71  0.62  GLUCOSE  136*  97  142*    Imaging Dg Chest Port 1 View  01/29/2013   CLINICAL DATA:  Video-assisted thoracoscopic surgery  EXAM: PORTABLE CHEST - 1 VIEW  COMPARISON:  Yesterday  FINDINGS: Right chest tube and PICC are stable. Internal jugular central venous catheter has removed. Airspace disease throughout the right lung has not significantly changed. Minimal left perihilar atelectasis. Small right apical and lateral pneumothorax is suspected. Overall there has been improved aeration.  IMPRESSION: Stable airspace disease within the right lung. Improved aeration within the lungs bilaterally.  Small right apical and lateral pneumothorax is suspected but partially obscured by the chest tube.   Electronically Signed   By: Maryclare Bean M.D.   On: 01/29/2013 08:07   Dg Chest Port 1 View  01/28/2013   CLINICAL DATA:  Chest soreness.  Chest tube.  EXAM: PORTABLE CHEST - 1 VIEW  COMPARISON:  01/27/2013  FINDINGS: Right chest tube tip lies at the right apex, stable. No convincing pneumothorax.  Right PICC and right  internal jugular central venous line are both stable.  Dense consolidation is centered on the right mid lung in the right upper lobe abutting the minor fissure. Allowing for differences in patient positioning and lung volume, this is unchanged. Left lung is essentially clear.  Cardiac silhouette is normal in size.  Normal mediastinal contour.  IMPRESSION: 1. Stable right chest tube.  No pneumothorax. 2. Persistent extensive right lung consolidation centered on the lower right upper lobe, consistent with pneumonia. Underlying neoplasm comment suggested on the prior CT, remains in the differential diagnosis.   Electronically Signed   By: Lajean Manes M.D.   On: 01/28/2013 08:42   Dg Chest Port 1 View  01/27/2013   CLINICAL DATA:  Status post right lung biopsy  EXAM: PORTABLE CHEST - 1 VIEW  COMPARISON:  01/26/2013  FINDINGS: No change in right PICC line. Right chest tube has been placed. There is a right internal jugular central line, with tip at the cavoatrial junction. There is no pneumothorax. Left lung is clear and the heart size is normal. On the right, there is increased severity of consolidation involving the mid to upper lung zones.  IMPRESSION: Increased severity of extensive consolidation on the right. No pneumothorax. Lines and tubes as described.   Electronically Signed   By: Skipper Cliche M.D.   On: 01/27/2013 14:45     ASSESSMENT/PLAN:  A: Metastatic AdenoCA with lytic rib and vertebral lesions - s/p VATS bx on 1/27 > path consistent with metastatic adenoCA P: - pred taper - Tussionex prn - Oncology rec's outpatient follow-up after acute recovery. F/u likely for [place] as pt lives close to there.  Alk and EGFR are pending, will influence treatment choices.  - IS and flutter valve - notified Dr Halford Chessman of the results  A: Pericardial effusion s/p drain. P: - Echo 1/23 showed norm LV size 7 function w/ EF=55-60%, Gr1DD, valves ok, triv peric fluid remains.  A: Pleuritic chest  pain. P: -Currently has PCA -continue anti-tussive medications  A: Acute hypoxic respiratory failure P: -oxygen as needed to keep SpO2 > 92%   Georgann Housekeeper, ACNP [facility] Pulmonology/Critical Care Pager [phone] or [phone]  Baltazar Apo, MD, PhD 01/29/2013, 11:13 AM [place] [facility] [phone] or if no answer [phone]

## 2013-01-29 NOTE — Progress Notes (Addendum)
TCTS DAILY ICU PROGRESS NOTE                   [address]            [place] [address]          [phone]   2 Days Post-Op Procedure(s) (LRB): VIDEO BRONCHOSCOPY (N/A) VIDEO ASSISTED THORACOSCOPY (Right) LUNG BIOPSY (Right)  Total Length of Stay:  LOS: 18 days   Subjective: Feels fair, lot of discomfort, some SOB but stable  Objective: Vital signs in last 24 hours: Temp:  [97.4 F (36.3 C)-98.3 F (36.8 C)] 97.4 F (36.3 C) (01/29 1227) Pulse Rate:  [93-108] 93 (01/29 0420) Cardiac Rhythm:  [-] Normal sinus rhythm (01/29 0800) Resp:  [12-21] 15 (01/29 1227) BP: (107-130)/(69-96) 130/90 mmHg (01/29 0800) SpO2:  [90 %-95 %] 95 % (01/29 0420) FiO2 (%):  [44 %] 44 % (01/28 1435)  Filed Weights   01/12/13 0324 01/13/13 0600 01/25/13 1226  Weight: 165 lb 9.1 oz (75.1 kg) 163 lb 9.3 oz (74.2 kg) 186 lb 14.4 oz (84.777 kg)    Weight change:    Hemodynamic parameters for last 24 hours:    Intake/Output from previous day: 01/28 0701 - 01/29 0700 In: 2896.7 [P.O.:720; I.V.:2176.7] Out: 3300 [Urine:3150; Chest Tube:150]  Intake/Output this shift: Total I/O In: 363 [P.O.:360; I.V.:3] Out: 395 [Urine:325; Chest Tube:70]  Current Meds: Scheduled Meds: . bisacodyl  10 mg Oral Daily  . enoxaparin (LOVENOX) injection  40 mg Subcutaneous Q24H  . feeding supplement (ENSURE COMPLETE)  237 mL Oral BID BM  . fentaNYL   Intravenous Q4H  . pantoprazole  40 mg Oral BID AC  . predniSONE  40 mg Oral Q breakfast  . sodium chloride  3 mL Intravenous Q12H   Continuous Infusions: . sodium chloride 20 mL/hr at 01/24/13 2027  . dextrose 5 % and 0.45% NaCl 100 mL/hr at 01/29/13 1000   PRN Meds:.acetaminophen, albuterol, chlorpheniramine-HYDROcodone, diphenhydrAMINE, diphenhydrAMINE, HYDROcodone-acetaminophen, morphine injection, naloxone, ondansetron (ZOFRAN) IV, oxyCODONE-acetaminophen, potassium chloride, promethazine, senna-docusate, sodium chloride, sodium  chloride, zolpidem  General appearance: alert, cooperative and no distress Heart: regular rate and rhythm Lungs: dim on right Abdomen: soft, non-tender Extremities: mild edema Wound: incis ok  Lab Results: CBC: Recent Labs  01/28/13 0421 01/29/13 0516  WBC 15.8* 14.7*  HGB 12.8 12.4  HCT 38.5 37.2  PLT 276 246   BMET:  Recent Labs  01/28/13 0421 01/29/13 0516  NA 140 140  K 4.6 4.2  CL 99 101  CO2 29 31  GLUCOSE 97 142*  BUN 20 23  CREATININE 0.71 0.62  CALCIUM 8.2* 7.9*    PT/INR:  Recent Labs  01/26/13 1700  LABPROT 13.8  INR 1.08   Radiology: Dg Chest Port 1 View  01/29/2013   CLINICAL DATA:  Video-assisted thoracoscopic surgery  EXAM: PORTABLE CHEST - 1 VIEW  COMPARISON:  Yesterday  FINDINGS: Right chest tube and PICC are stable. Internal jugular central venous catheter has removed. Airspace disease throughout the right lung has not significantly changed. Minimal left perihilar atelectasis. Small right apical and lateral pneumothorax is suspected. Overall there has been improved aeration.  IMPRESSION: Stable airspace disease within the right lung. Improved aeration within the lungs bilaterally.  Small right apical and lateral pneumothorax is suspected but partially obscured by the chest tube.   Electronically Signed   By: Maryclare Bean M.D.   On: 01/29/2013 08:07   Dg Chest Port 1 View  01/28/2013  CLINICAL DATA:  Chest soreness.  Chest tube.  EXAM: PORTABLE CHEST - 1 VIEW  COMPARISON:  01/27/2013  FINDINGS: Right chest tube tip lies at the right apex, stable. No convincing pneumothorax.  Right PICC and right internal jugular central venous line are both stable.  Dense consolidation is centered on the right mid lung in the right upper lobe abutting the minor fissure. Allowing for differences in patient positioning and lung volume, this is unchanged. Left lung is essentially clear.  Cardiac silhouette is normal in size.  Normal mediastinal contour.  IMPRESSION: 1. Stable  right chest tube.  No pneumothorax. 2. Persistent extensive right lung consolidation centered on the lower right upper lobe, consistent with pneumonia. Underlying neoplasm comment suggested on the prior CT, remains in the differential diagnosis.   Electronically Signed   By: Lajean Manes M.D.   On: 01/28/2013 08:42   Dg Chest Port 1 View  01/27/2013   CLINICAL DATA:  Status post right lung biopsy  EXAM: PORTABLE CHEST - 1 VIEW  COMPARISON:  01/26/2013  FINDINGS: No change in right PICC line. Right chest tube has been placed. There is a right internal jugular central line, with tip at the cavoatrial junction. There is no pneumothorax. Left lung is clear and the heart size is normal. On the right, there is increased severity of consolidation involving the mid to upper lung zones.  IMPRESSION: Increased severity of extensive consolidation on the right. No pneumothorax. Lines and tubes as described.   Electronically Signed   By: Skipper Cliche M.D.   On: 01/27/2013 14:45     Assessment/Plan: S/P Procedure(s) (LRB): VIDEO BRONCHOSCOPY (N/A) VIDEO ASSISTED THORACOSCOPY (Right) LUNG BIOPSY (Right)  1 stable with only minor CT drainage, 70 cc yesterday, no air leak. Poss d/c soon 2 Patient aware of DX- oncol to tx following acute recovery 3 cont pca for now 4 pulm medical management- cont    GOLD,WAYNE E 01/29/2013 1:31 PM DC chest tube Cont PCA today  patient examined and medical record reviewed,agree with above note. VAN TRIGT III,Raeli Wiens 01/29/2013

## 2013-01-30 ENCOUNTER — Inpatient Hospital Stay (HOSPITAL_COMMUNITY): Payer: Medicaid Other

## 2013-01-30 LAB — CBC
HCT: 38.8 % (ref 36.0–46.0)
Hemoglobin: 12.6 g/dL (ref 12.0–15.0)
MCH: 29.6 pg (ref 26.0–34.0)
MCHC: 32.5 g/dL (ref 30.0–36.0)
MCV: 91.3 fL (ref 78.0–100.0)
Platelets: 292 10*3/uL (ref 150–400)
RBC: 4.25 MIL/uL (ref 3.87–5.11)
RDW: 13.9 % (ref 11.5–15.5)
WBC: 14.1 10*3/uL — ABNORMAL HIGH (ref 4.0–10.5)

## 2013-01-30 MED ORDER — POTASSIUM CHLORIDE 10 MEQ/100ML IV SOLN
10.0000 meq | INTRAVENOUS | Status: DC
  Filled 2013-01-30 (×3): qty 100

## 2013-01-30 NOTE — Progress Notes (Signed)
PULMONARY/CCM PROGRESS NOTE  Requesting MD/Service: Nishan/Cards Date of admission: 1/11 Date of consult: 1/12 Reason for consultation: abnormal CXR  BRIEF PATIENT DESCRIPTION:  53 yo female presented to [facility] on 1/9 with dyspnea, and transferred to [facility] on 1/11 for management of pericardial effusion.  PCCM consulted to assist with respiratory management.  SIGNIFICANT EVENTS: 1/09 Admit to [facility] 1/11 Transfer to [facility], pericardial drain placed >> cytology negative 1/12 PCCM consulted 1/13 pericardial drain removed 1/14 Transfer to PCCM service 1/15 Pleuritic chest pain 1/19 Added solumedrol 1/22 TCTS consulted 5/73 VATS bx, uncomplicated 2/20 Chest Tube out  STUDIES: 1/09 CT chest >> multifocal pneumonia, small Rt pleural effusion, multiple pulmonary nodules, mod pericardial effusion 1/11 Echo [facility]) >> large pericardial effusion with early tamponade 1/11 Echo >> EF 65-70%. Trivial residual pericardial effusion 1/11 Labs >> ESR 2, ANCA negative, RF < 10, anti CCP < 2, ANA negative 1/12 Quantiferon gold >> negative 1/14 Bronchoscopy >> normal airway exam, cytology shows severe 2nd to inflammation 1/16 Echocardiogram >> There was no pericardial effusion 1/20 CT chest >>  Trace pericardial fluid, consolidation RUL, Rt > Lt peribronchovascular nodules, small Rt effusion, lytic appearing lesin Lt fifth rib, lucency C6 vertebral body    CULTURES: Pericardial AFB 1/11 >> smear negative >>  HIV serology 1/12 >> negative Strep Ag 1/12 >> negative Legionella Ag 1/12 >> negative PCP 1/14>>> Sputum from FOB 1/14>>>NEG  Legionella from FOB 1/14>>> NEG  AFB from FOB 1/14 >> smear neg >>  Fungal from FOB 1/14 >> smear neg >>  AFB 1/27>> Fungal from 1/227>>   ANTIBIOTICS: Vancomycin 1/11 >> 1/15 Zosyn 1/11 >> 1/11 Levaquin 1/11 >> 1/28  SUBJECTIVE/ Interval: Chest tube out 1/29, left pleuritic chest pain persists.  States her breathing is slightly improved.   OBJECTIVE: BP 112/73  Pulse 100  Temp(Src) 98 F (36.7 C) (Oral)  Resp 18  Ht 5' 6"  (1.676 m)  Wt 84.777 kg (186 lb 14.4 oz)  BMI 30.18 kg/m2  SpO2 93%  SpO2 94% on RA while eating breakfast.  EXAM:  Gen: no distress HEENT: NCAT, PERRL Lungs: few scattered rhonchi, no audible wheeze, diminished bases  Cardiovascular: tachy, regular, no murmur Chest: tenderness on Lt chest Abdomen: Soft, non tender Ext: no edema Neuro: Normal strength Skin: no rashes  DATA:  CBC Recent Labs     01/28/13  0421  01/29/13  0516  01/30/13  0425  WBC  15.8*  14.7*  14.1*  HGB  12.8  12.4  12.6  HCT  38.5  37.2  38.8  PLT  276  246  292    BMET Recent Labs     01/28/13  0421  01/29/13  0516  NA  140  140  K  4.6  4.2  CL  99  101  CO2  29  31  BUN  20  23  CREATININE  0.71  0.62  GLUCOSE  97  142*    Imaging Dg Chest Port 1 View  01/29/2013   CLINICAL DATA:  Video-assisted thoracoscopic surgery  EXAM: PORTABLE CHEST - 1 VIEW  COMPARISON:  Yesterday  FINDINGS: Right chest tube and PICC are stable. Internal jugular central venous catheter has removed. Airspace disease throughout the right lung has not significantly changed. Minimal left perihilar atelectasis. Small right apical and lateral pneumothorax is suspected. Overall there has been improved aeration.  IMPRESSION: Stable airspace disease within the right lung. Improved aeration within the lungs bilaterally.  Small right apical and lateral  pneumothorax is suspected but partially obscured by the chest tube.   Electronically Signed   By: Maryclare Bean M.D.   On: 01/29/2013 08:07     ASSESSMENT/PLAN:  A: Metastatic AdenoCA with lytic rib and vertebral lesions - s/p VATS bx on 1/27 > path consistent with metastatic adenoCA - CT out 1/29 P: - pred taper - Cough suppresion - Oncology rec's outpatient follow-up after acute recovery. F/u likely for [place] as pt lives close to there.   Alk and EGFR are pending, will influence treatment choices. Pt states d/c planned for Monday, although could not verify that.  - IS and flutter valve  A: Pericardial effusion s/p drain. P: - Echo 1/23 showed norm LV size, function w/ EF=55-60%, Gr1DD, valves ok, triv peric fluid remains.  A: Pleuritic chest pain. P: -Currently has PCA -continue anti-tussive medications  A: Acute hypoxic respiratory failure P: -oxygen as needed to keep SpO2 > 92% - Currently 94% on RA   Georgann Housekeeper, ACNP [facility] Pulmonology/Critical Care Pager [phone] or [phone]   Baltazar Apo, MD, PhD 01/30/2013, 11:49 AM [place] [facility] [phone] or if no answer [phone]

## 2013-01-30 NOTE — Progress Notes (Addendum)
       [address]       [place] [address]             [phone]          3 Days Post-Op Procedure(s) (LRB): VIDEO BRONCHOSCOPY (N/A) VIDEO ASSISTED THORACOSCOPY (Right) LUNG BIOPSY (Right)  Subjective: Sore, otherwise no complaints.   Objective: Vital signs in last 24 hours: Patient Vitals for the past 24 hrs:  BP Temp Temp src Pulse Resp SpO2  01/30/13 0800 109/65 mmHg 98.1 F (36.7 C) Oral - - -  01/30/13 0751 - - - - 18 -  01/30/13 0711 - 98 F (36.7 C) Oral - - -  01/30/13 0424 112/73 mmHg 98 F (36.7 C) Oral 100 16 93 %  01/30/13 0000 - 97.9 F (36.6 C) Oral - - -  01/29/13 2359 96/53 mmHg - - 96 16 95 %  01/29/13 2040 135/86 mmHg 98.1 F (36.7 C) Oral 96 24 94 %  01/29/13 1600 124/73 mmHg 97.4 F (36.3 C) Oral - - -  01/29/13 1552 - - - - 21 -  01/29/13 1227 108/60 mmHg 97.4 F (36.3 C) Oral 91 15 93 %   Current Weight  01/25/13 186 lb 14.4 oz (84.777 kg)     Intake/Output from previous day: 01/29 0701 - 01/30 0700 In: 1083 [P.O.:1080; I.V.:3] Out: 2085 [Urine:1975; Chest Tube:110]    PHYSICAL EXAM:  Heart: RRR Lungs: Decreased BS on R Wound:Dressed and dry    Lab Results: CBC: Recent Labs  01/29/13 0516 01/30/13 0425  WBC 14.7* 14.1*  HGB 12.4 12.6  HCT 37.2 38.8  PLT 246 292   BMET:  Recent Labs  01/28/13 0421 01/29/13 0516  NA 140 140  K 4.6 4.2  CL 99 101  CO2 29 31  GLUCOSE 97 142*  BUN 20 23  CREATININE 0.71 0.62  CALCIUM 8.2* 7.9*    PT/INR: No results found for this basename: LABPROT, INR,  in the last 72 hours  CXR: FINDINGS:  The right-sided chest tube has been removed. There is no residual  pneumothorax. Diffuse right-sided airspace disease persists. Mild  interstitial and airspace disease is present on the left. Multiple  pulmonary nodules are again noted. And the expanded left fifth  anterior rib is evident.  IMPRESSION:  1. Removal of the right-sided chest tube and resolution of  the  right-sided pneumothorax.  2. Diffuse bilateral pulmonary nodules.  3. Persistent asymmetric right-sided airspace disease. This is  compatible with the patient's known postobstructive  pneumonitis/pneumonia.   Assessment/Plan: S/P Procedure(s) (LRB): VIDEO BRONCHOSCOPY (N/A) VIDEO ASSISTED THORACOSCOPY (Right) LUNG BIOPSY (Right) Stable from surgical standpoint. Not using PCA- will d/c and saline lock IVF. Continue current care per pulmonary. Will arrange outpatient followup in case she goes home this weekend.   LOS: 19 days    COLLINS,GINA H 01/30/2013  patient examined and medical record reviewed,agree with above note. This patient should be ready for discharge home over the weekend. I will follow patient office for postoperative surgical care VAN TRIGT III,Gail Vendetti 01/30/2013

## 2013-01-31 NOTE — Progress Notes (Addendum)
      [address]       [place] [address]             [phone]       4 Days Post-Op Procedure(s) (LRB): VIDEO BRONCHOSCOPY (N/A) VIDEO ASSISTED THORACOSCOPY (Right) LUNG BIOPSY (Right)  Subjective: Patient visiting with family. She has no complaints  Objective: Vital signs in last 24 hours: Temp:  [97.4 F (36.3 C)-98.2 F (36.8 C)] 97.4 F (36.3 C) (01/31 0800) Pulse Rate:  [91-112] 91 (01/31 0530) Cardiac Rhythm:  [-] Sinus tachycardia (01/31 0312) Resp:  [15-22] 16 (01/31 0530) BP: (102-123)/(60-75) 123/75 mmHg (01/31 0312) SpO2:  [95 %-98 %] 98 % (01/31 0312)     Intake/Output from previous day: 01/30 0701 - 01/31 0700 In: 1203 [P.O.:1200; I.V.:3] Out: 1900 [Urine:1900]   Physical Exam:  Cardiovascular: RRR Pulmonary: Clear to auscultation bilaterally; no rales, wheezes, or rhonchi. Wounds: Dressing is clean and dry.     Lab Results: CBC: Recent Labs  01/29/13 0516 01/30/13 0425  WBC 14.7* 14.1*  HGB 12.4 12.6  HCT 37.2 38.8  PLT 246 292   BMET:  Recent Labs  01/29/13 0516  NA 140  K 4.2  CL 101  CO2 31  GLUCOSE 142*  BUN 23  CREATININE 0.62  CALCIUM 7.9*    PT/INR: No results found for this basename: LABPROT, INR,  in the last 72 hours ABG:  INR: Will add last result for INR, ABG once components are confirmed Will add last 4 CBG results once components are confirmed  Assessment/Plan:  1. CV - SR 2.  Pulmonary - On Prednisone. 3.Discharge disposition per pulmonary   ZIMMERMAN,DONIELLE MPA-C 01/31/2013,11:13 AM   patient examined and medical record reviewed,agree with above note. VAN TRIGT III,PETER 02/02/2013

## 2013-01-31 NOTE — Progress Notes (Signed)
Patient seen awake in bed with family members at bedside.  Patient reported that she had a good day and had no concern at this time. We will continue to monitor.

## 2013-02-01 LAB — CREATININE, SERUM
Creatinine, Ser: 0.71 mg/dL (ref 0.50–1.10)
GFR calc Af Amer: >90 mL/min (ref >90)

## 2013-02-02 ENCOUNTER — Inpatient Hospital Stay (HOSPITAL_COMMUNITY): Payer: Medicaid Other

## 2013-02-02 MED ORDER — PREDNISONE 10 MG PO TABS: ORAL_TABLET | ORAL | Status: DC

## 2013-02-02 MED ORDER — PROMETHAZINE HCL 12.5 MG PO TABS: 12.5000 mg | ORAL_TABLET | Freq: Four times a day (QID) | ORAL | Status: DC | PRN

## 2013-02-02 MED ORDER — OXYCODONE-ACETAMINOPHEN 5-325 MG PO TABS: 1.0000 | ORAL_TABLET | ORAL | Status: DC | PRN

## 2013-02-02 MED ORDER — ZOLPIDEM TARTRATE 5 MG PO TABS: 5.0000 mg | ORAL_TABLET | Freq: Every evening | ORAL | Status: DC | PRN

## 2013-02-02 MED ORDER — HYDROCOD POLST-CHLORPHEN POLST 10-8 MG/5ML PO LQCR: 5.0000 mL | Freq: Two times a day (BID) | ORAL | Status: DC | PRN

## 2013-02-02 MED ORDER — SENNOSIDES-DOCUSATE SODIUM 8.6-50 MG PO TABS: 1.0000 | ORAL_TABLET | Freq: Every evening | ORAL | Status: DC | PRN

## 2013-02-02 NOTE — Discharge Summary (Signed)
Physician Discharge Summary       Patient ID: [name] MRN: [id number] DOB/AGE: 1960/11/04 53 y.o.  Admit date: 01/11/2013 Discharge date: 02/02/2013  Discharge Diagnoses:  Active Problems:   Pericardial tamponade   Pulmonary infiltrates   Adenocarcinoma of lung, stage 4   Pleuritic chest pain   Detailed Hospital Course:  Previously healthy 19 yoF admitted to [facility] 1/09 with progressive dyspnea and transferred to [facility] 1/11 for eval and mgmt of pericardial effusion and early tamponade. Underwent pericardial drain placement on admission with 700 cc's of bloody fluid removed. Repeat CXR 1/12 with worsening infiltrates. PCCM asked to eval. The patient's respiratory status had improved, however she redeveloped chest pain on 1/15 and 1/16. Repeat CXR obtained showed recurrent collapse and consolidations of RUL. Cardiology was re consulted and repeat Echocardiogram was performed and did not show evidence of recurrent pericardial effusion. The patient was treated with continue ABX. She was placed on scheduled NSAIDS to treat pleuritic chest pain. These were eventually discontinued and steroids were started. Patient continued to have respiratory problems. Repeat CT scan was obtained and showed significant changes from the previous study obtained 01/09/2013. There was a new mass like area the medial aspect of the right upper lobe measuring 3.6 x 2.2 cm. There was also evidence of extensive bronchial wall thickening, thickening of the peribronchovascular interstitium, and innumerable peribronchovascular micro and macronodules scattered throughout the lungs bilaterally. There were also lytic lesions present along the spinal column. Due to this finding, there was an increased concern for malignancy. Thoracic surgery has been consulted for biopsy of the lung mass. She underwent bronchoscopy and R VATS biopsy on 01/27/13.pathology report, case number [id number] is positive for  invasive adenocarcinoma, well- differentiated,grade 1 spanning at least 4 cm.the inked parenchyma margins of specimens 1 and 2 are positive for adenocarcinoma. Positive visceral pleural invasion.positive lymphvascular invasion. No lymph nodes were resected. She is pT4, pNX.the blood was sent for EGFR and ALK testing which results are pending. She was ready for d/c as of 2/2 and will be discharged to home with plans to follow up with oncology and Thoracic surgery     Discharge Plan by diagnoses  Metastatic AdenoCA with lytic rib and vertebral lesions  - s/p VATS bx on 1/27 > path consistent with metastatic adenoCA  - CT out 1/29  P:  - pred taper to off  - Cough suppression  -Alk and EGFR are pending, will influence treatment choices. - has f/u with Dr Maryjane Hurter on Friday    [facility] tests/ studies/ interventions and procedures  SIGNIFICANT EVENTS:  1/09 Admit to [facility]  1/11 [facility], pericardial drain placed >> cytology negative  1/12 PCCM consulted  1/13 pericardial drain removed  1/14 Transfer to PCCM service  1/15 Pleuritic chest pain  1/19 Added solumedrol  1/22 TCTS consulted  2/13 VATS bx, uncomplicated  0/86 Chest Tube out   STUDIES:  1/09 CT chest >> multifocal pneumonia, small Rt pleural effusion, multiple pulmonary nodules, mod pericardial effusion  1/11 Echo [facility]) >> large pericardial effusion with early tamponade  1/11 Echo >> EF 65-70%. Trivial residual pericardial effusion  1/11 Labs >> ESR 2, ANCA negative, RF < 10, anti CCP < 2, ANA negative  1/12 Quantiferon gold >> negative  1/14 Bronchoscopy >> normal airway exam, cytology shows severe 2nd to inflammation  1/16 Echocardiogram >> There was no pericardial effusion  1/20 CT chest >> Trace pericardial fluid, consolidation RUL, Rt > Lt peribronchovascular  nodules, small Rt effusion, lytic appearing lesin Lt fifth rib, lucency C6 vertebral body   CULTURES:  Pericardial AFB 1/11 >> smear negative >>   HIV serology 1/12 >> negative  Strep Ag 1/12 >> negative  Legionella Ag 1/12 >> negative  PCP 1/14>>>  Sputum from FOB 1/14>>>NEG  Legionella from FOB 1/14>>> NEG  AFB from FOB 1/14 >> smear neg >>  Fungal from FOB 1/14 >> smear neg >>  AFB 1/27>>  Fungal from 1/227>>   ANTIBIOTICS: Vancomycin 1/11 >> 1/15  Zosyn 1/11 >> 1/11  Levaquin 1/11 >> 1/28   Discharge Exam: BP 130/80  Pulse 104  Temp(Src) 98.2 F (36.8 C) (Oral)  Resp 21  Ht 5' 6"  (1.676 m)  Wt 84.777 kg (186 lb 14.4 oz)  BMI 30.18 kg/m2  SpO2 96%  EXAM:  Gen: no distress  HEENT: NCAT, PERRL  Lungs: few scattered rhonchi, no audible wheeze, diminished bases  Cardiovascular: tachy, regular, no murmur  Chest: tenderness on Lt chest site 5/10 Abdomen: Soft, non tender  Ext: no edema  Neuro: Normal strength  Skin: no rashes   Labs at discharge Lab Results  Component Value Date   CREATININE 0.71 02/01/2013   BUN 23 01/29/2013   NA 140 01/29/2013   K 4.2 01/29/2013   CL 101 01/29/2013   CO2 31 01/29/2013   Lab Results  Component Value Date   WBC 14.1* 01/30/2013   HGB 12.6 01/30/2013   HCT 38.8 01/30/2013   MCV 91.3 01/30/2013   PLT 292 01/30/2013   Lab Results  Component Value Date   ALT 21 01/29/2013   AST 13 01/29/2013   ALKPHOS 79 01/29/2013   BILITOT 0.3 01/29/2013   Lab Results  Component Value Date   INR 1.08 01/26/2013    Current radiology studies Dg Chest Port 1 View  02/02/2013   CLINICAL DATA:  Vats, pneumonia  EXAM: PORTABLE CHEST - 1 VIEW  COMPARISON:  DG CHEST 2 VIEW dated 01/30/2013  FINDINGS: A right-sided central venous catheter is appreciated with tip projecting in the region of the superior vena cava. Diffuse pulmonary opacities are once again appreciated within the right hemi thorax the appears to be slight increased consolidation within the right lower lobe region. No new focal infiltrates are new focal regions of consolidation identified. Bilateral pulmonary nodules are identified within  the right and left hemithoraces appears stable. There is no evidence of a pneumothorax. The osseous structures unremarkable. Cardiac silhouette is within the upper limits of normal.  IMPRESSION: Increased consolidative component of the diffuse infiltrate in the right hemi thorax in the region right lung base. 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  Diffuse bilateral pulmonary nodules are again identified appears stable.   Electronically Signed   By: Margaree Mackintosh M.D.   On: 02/02/2013 11:44    Disposition:  Final discharge disposition not confirmed      Discharge Orders   Future Appointments Provider Department Dept Phone   02/06/2013 10:00 AM [facility] Nurse [facility] [place] [phone]   02/18/2013 10:30 AM Ivin Poot, MD [facility] [phone]   Future Orders Complete By Expires   Diet - low sodium heart healthy  As directed    Increase activity slowly  As directed        Medication List    STOP taking these medications       ibuprofen 200 MG tablet  Commonly known as:  ADVIL,MOTRIN      TAKE these medications       Biotin 5000 MCG Tabs  Take 10,000 mcg by mouth daily.     chlorpheniramine-HYDROcodone 10-8 MG/5ML Lqcr  Commonly known as:  TUSSIONEX  Take 5 mLs by mouth every 12 (twelve) hours as needed for cough.     diphenhydrAMINE 25 mg capsule  Commonly known as:  BENADRYL  Take 50 mg by mouth daily as needed for allergies.     omeprazole 20 MG capsule  Commonly known as:  PRILOSEC  Take 20 mg by mouth daily.     oxyCODONE-acetaminophen 5-325 MG per tablet  Commonly known as:  PERCOCET/ROXICET  Take 1-2 tablets by mouth every 4 (four) hours as needed for severe pain.     predniSONE 10 MG tablet  Commonly known as:  DELTASONE  Take 4 tabs  daily with food x 4 days, then 3 tabs daily x 4 days, then 2 tabs daily x 4  days, then 1 tab daily x4 days then stop. #40     promethazine 12.5 MG tablet  Commonly known as:  PHENERGAN  Take 1 tablet (12.5 mg total) by mouth every 6 (six) hours as needed for nausea or vomiting.     senna-docusate 8.6-50 MG per tablet  Commonly known as:  Senokot-S  Take 1 tablet by mouth at bedtime as needed for moderate constipation.     vitamin B-12 1000 MCG tablet  Commonly known as:  CYANOCOBALAMIN  Take 1,000 mcg by mouth daily.     zolpidem 5 MG tablet  Commonly known as:  AMBIEN  Take 1 tablet (5 mg total) by mouth at bedtime as needed for sleep (insomnia).       Follow-up Information   Follow up with VAN Wilber Oliphant, MD On 02/18/2013. (Have a chest x-ray at [facility] at 9:30, then see MD at 10:30  )    Specialty:  Cardiothoracic Surgery   Contact information:   [address] [place] [address] [phone]       Follow up with [facility] [place] NURSE On 02/06/2013. (Dr. Lucianne Lei Trigt's nurse will remove sutures at 10:00 )       Discharged Condition: fair/gaurded   Physician Statement:   The Patient was personally examined, the discharge assessment and plan has been personally reviewed and I agree with ACNP Babcock's assessment and plan. > 30 minutes of time have been dedicated to discharge assessment, planning and discharge instructions.   SignedMarni Griffon 02/02/2013, 2:36 PM   Chesley Mires, MD [facility] Pulmonary/Critical Care 02/02/2013, 3:45 PM Pager:  [phone] After 3pm call: [phone]

## 2013-02-02 NOTE — Progress Notes (Signed)
Patient being discharged home per MD order, all discharge instructions given to patient and family.

## 2013-02-03 ENCOUNTER — Encounter (HOSPITAL_COMMUNITY): Payer: Self-pay

## 2013-02-05 ENCOUNTER — Encounter (HOSPITAL_COMMUNITY): Payer: Self-pay

## 2013-02-06 ENCOUNTER — Ambulatory Visit: Payer: Self-pay | Admitting: Oncology

## 2013-02-09 ENCOUNTER — Ambulatory Visit (INDEPENDENT_AMBULATORY_CARE_PROVIDER_SITE_OTHER): Payer: Medicaid Other

## 2013-02-09 DIAGNOSIS — Z4802 Encounter for removal of sutures: Secondary | ICD-10-CM

## 2013-02-09 DIAGNOSIS — D381 Neoplasm of uncertain behavior of trachea, bronchus and lung: Secondary | ICD-10-CM

## 2013-02-09 NOTE — Progress Notes (Signed)
Removed 1 suture from chest tube site. No signs of infection and pt tolerated well.

## 2013-02-10 LAB — FUNGUS CULTURE W SMEAR: Fungal Smear: NONE SEEN

## 2013-02-12 ENCOUNTER — Ambulatory Visit: Payer: Self-pay | Admitting: Vascular Surgery

## 2013-02-13 ENCOUNTER — Other Ambulatory Visit: Payer: Self-pay | Admitting: *Deleted

## 2013-02-13 DIAGNOSIS — C3490 Malignant neoplasm of unspecified part of unspecified bronchus or lung: Secondary | ICD-10-CM

## 2013-02-16 ENCOUNTER — Ambulatory Visit: Payer: Self-pay | Admitting: Oncology

## 2013-02-18 ENCOUNTER — Ambulatory Visit: Payer: Medicaid Other | Admitting: Cardiothoracic Surgery

## 2013-02-19 ENCOUNTER — Telehealth: Payer: Self-pay | Admitting: *Deleted

## 2013-02-19 LAB — COMPREHENSIVE METABOLIC PANEL
ALBUMIN: 3.2 g/dL — AB (ref 3.4–5.0)
ALT: 28 U/L (ref 12–78)
AST: 16 U/L (ref 15–37)
Alkaline Phosphatase: 109 U/L
Anion Gap: 10 (ref 7–16)
BUN: 19 mg/dL — ABNORMAL HIGH (ref 7–18)
Bilirubin,Total: 0.2 mg/dL (ref 0.2–1.0)
CO2: 28 mmol/L (ref 21–32)
CREATININE: 0.9 mg/dL (ref 0.60–1.30)
Calcium, Total: 8.7 mg/dL (ref 8.5–10.1)
Chloride: 103 mmol/L (ref 98–107)
EGFR (African American): >60
Glucose: 90 mg/dL (ref 65–99)
OSMOLALITY: 283 (ref 275–301)
Potassium: 3.8 mmol/L (ref 3.5–5.1)
Sodium: 141 mmol/L (ref 136–145)
Total Protein: 6.8 g/dL (ref 6.4–8.2)

## 2013-02-19 LAB — PROTEIN, URINE, RANDOM: PROTEIN, RANDOM URINE: 31 mg/dL — AB (ref 0–12)

## 2013-02-19 LAB — CBC CANCER CENTER
Basophil #: 0.1 x10 3/mm (ref 0.0–0.1)
Basophil %: 1.4 %
Eosinophil #: 0.2 x10 3/mm (ref 0.0–0.7)
Eosinophil %: 2.8 %
HCT: 40.2 % (ref 35.0–47.0)
HGB: 13.2 g/dL (ref 12.0–16.0)
Lymphocyte #: 2.9 x10 3/mm (ref 1.0–3.6)
Lymphocyte %: 38.7 %
MCH: 29.5 pg (ref 26.0–34.0)
MCHC: 33 g/dL (ref 32.0–36.0)
MCV: 90 fL (ref 80–100)
Monocyte #: 0.6 x10 3/mm (ref 0.2–0.9)
Monocyte %: 7.9 %
Neutrophil #: 3.7 x10 3/mm (ref 1.4–6.5)
Neutrophil %: 49.2 %
Platelet: 292 x10 3/mm (ref 150–440)
RBC: 4.49 10*6/uL (ref 3.80–5.20)
RDW: 14.6 % — ABNORMAL HIGH (ref 11.5–14.5)
WBC: 7.4 x10 3/mm (ref 3.6–11.0)

## 2013-02-19 NOTE — Telephone Encounter (Signed)
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  I called Dr. Alva Garnet and will forward message to him.

## 2013-02-23 LAB — AFB CULTURE WITH SMEAR (NOT AT ARMC): Acid Fast Smear: NONE SEEN

## 2013-02-23 LAB — FUNGUS CULTURE W SMEAR: Fungal Smear: NONE SEEN

## 2013-02-26 ENCOUNTER — Ambulatory Visit: Payer: Medicaid Other | Admitting: Cardiothoracic Surgery

## 2013-03-01 ENCOUNTER — Ambulatory Visit: Payer: Self-pay | Admitting: Oncology

## 2013-03-03 LAB — COMPREHENSIVE METABOLIC PANEL
ALBUMIN: 3.2 g/dL — AB (ref 3.4–5.0)
ALK PHOS: 138 U/L — AB
AST: 19 U/L (ref 15–37)
Anion Gap: 10 (ref 7–16)
BUN: 14 mg/dL (ref 7–18)
Bilirubin,Total: 0.2 mg/dL (ref 0.2–1.0)
CO2: 25 mmol/L (ref 21–32)
CREATININE: 0.79 mg/dL (ref 0.60–1.30)
Calcium, Total: 8.5 mg/dL (ref 8.5–10.1)
Chloride: 107 mmol/L (ref 98–107)
EGFR (Non-African Amer.): >60
Glucose: 100 mg/dL — ABNORMAL HIGH (ref 65–99)
Osmolality: 284 (ref 275–301)
Potassium: 3.8 mmol/L (ref 3.5–5.1)
SGPT (ALT): 28 U/L (ref 12–78)
Sodium: 142 mmol/L (ref 136–145)
Total Protein: 6.6 g/dL (ref 6.4–8.2)

## 2013-03-03 LAB — CBC CANCER CENTER
BASOS ABS: 0.1 x10 3/mm (ref 0.0–0.1)
Basophil %: 0.6 %
Eosinophil #: 0 x10 3/mm (ref 0.0–0.7)
Eosinophil %: 0.3 %
HCT: 39.9 % (ref 35.0–47.0)
HGB: 13.1 g/dL (ref 12.0–16.0)
LYMPHS ABS: 2.3 x10 3/mm (ref 1.0–3.6)
Lymphocyte %: 22.6 %
MCH: 29.2 pg (ref 26.0–34.0)
MCHC: 32.9 g/dL (ref 32.0–36.0)
MCV: 89 fL (ref 80–100)
MONO ABS: 0.6 x10 3/mm (ref 0.2–0.9)
Monocyte %: 6.2 %
NEUTROS ABS: 7.2 x10 3/mm — AB (ref 1.4–6.5)
Neutrophil %: 70.3 %
PLATELETS: 248 x10 3/mm (ref 150–440)
RBC: 4.49 10*6/uL (ref 3.80–5.20)
RDW: 15 % — ABNORMAL HIGH (ref 11.5–14.5)
WBC: 10.3 x10 3/mm (ref 3.6–11.0)

## 2013-03-09 ENCOUNTER — Ambulatory Visit: Payer: Medicaid Other

## 2013-03-09 LAB — AFB CULTURE WITH SMEAR (NOT AT ARMC): Acid Fast Smear: NONE SEEN

## 2013-03-11 LAB — AFB CULTURE WITH SMEAR (NOT AT ARMC): ACID FAST SMEAR: NONE SEEN

## 2013-03-12 LAB — COMPREHENSIVE METABOLIC PANEL
ANION GAP: 9 (ref 7–16)
AST: 16 U/L (ref 15–37)
Albumin: 3.1 g/dL — ABNORMAL LOW (ref 3.4–5.0)
Alkaline Phosphatase: 99 U/L
BUN: 16 mg/dL (ref 7–18)
Bilirubin,Total: 0.3 mg/dL (ref 0.2–1.0)
CALCIUM: 9.2 mg/dL (ref 8.5–10.1)
CO2: 28 mmol/L (ref 21–32)
CREATININE: 0.72 mg/dL (ref 0.60–1.30)
Chloride: 104 mmol/L (ref 98–107)
Glucose: 124 mg/dL — ABNORMAL HIGH (ref 65–99)
Osmolality: 284 (ref 275–301)
Potassium: 3.6 mmol/L (ref 3.5–5.1)
SGPT (ALT): 18 U/L (ref 12–78)
Sodium: 141 mmol/L (ref 136–145)
TOTAL PROTEIN: 6.6 g/dL (ref 6.4–8.2)

## 2013-03-12 LAB — CBC CANCER CENTER
BASOS PCT: 1 %
Basophil #: 0.1 x10 3/mm (ref 0.0–0.1)
EOS ABS: 0 x10 3/mm (ref 0.0–0.7)
EOS PCT: 0.3 %
HCT: 39 % (ref 35.0–47.0)
HGB: 12.7 g/dL (ref 12.0–16.0)
LYMPHS PCT: 12.4 %
Lymphocyte #: 1.5 x10 3/mm (ref 1.0–3.6)
MCH: 28.9 pg (ref 26.0–34.0)
MCHC: 32.5 g/dL (ref 32.0–36.0)
MCV: 89 fL (ref 80–100)
MONO ABS: 0.8 x10 3/mm (ref 0.2–0.9)
Monocyte %: 6.5 %
Neutrophil #: 9.8 x10 3/mm — ABNORMAL HIGH (ref 1.4–6.5)
Neutrophil %: 79.8 %
Platelet: 252 x10 3/mm (ref 150–440)
RBC: 4.38 10*6/uL (ref 3.80–5.20)
RDW: 15.6 % — ABNORMAL HIGH (ref 11.5–14.5)
WBC: 12.3 x10 3/mm — AB (ref 3.6–11.0)

## 2013-03-12 LAB — PROTEIN, URINE, RANDOM: Protein, Random Urine: 22 mg/dL — ABNORMAL HIGH (ref 0–12)

## 2013-03-19 LAB — CBC CANCER CENTER
Basophil #: 0 x10 3/mm (ref 0.0–0.1)
Basophil %: 1 %
EOS PCT: 1.5 %
Eosinophil #: 0.1 x10 3/mm (ref 0.0–0.7)
HCT: 34 % — AB (ref 35.0–47.0)
HGB: 11.3 g/dL — AB (ref 12.0–16.0)
Lymphocyte #: 1.5 x10 3/mm (ref 1.0–3.6)
Lymphocyte %: 30.6 %
MCH: 29.7 pg (ref 26.0–34.0)
MCHC: 33.1 g/dL (ref 32.0–36.0)
MCV: 90 fL (ref 80–100)
MONO ABS: 0.2 x10 3/mm (ref 0.2–0.9)
Monocyte %: 4.1 %
Neutrophil #: 3.1 x10 3/mm (ref 1.4–6.5)
Neutrophil %: 62.8 %
Platelet: 189 x10 3/mm (ref 150–440)
RBC: 3.8 10*6/uL (ref 3.80–5.20)
RDW: 15.7 % — ABNORMAL HIGH (ref 11.5–14.5)
WBC: 4.9 x10 3/mm (ref 3.6–11.0)

## 2013-03-25 LAB — CBC CANCER CENTER
Basophil #: 0 x10 3/mm (ref 0.0–0.1)
Basophil %: 1.3 %
EOS PCT: 4.1 %
Eosinophil #: 0.1 x10 3/mm (ref 0.0–0.7)
HCT: 35.1 % (ref 35.0–47.0)
HGB: 11.6 g/dL — ABNORMAL LOW (ref 12.0–16.0)
LYMPHS ABS: 0.9 x10 3/mm — AB (ref 1.0–3.6)
Lymphocyte %: 34.9 %
MCH: 29.6 pg (ref 26.0–34.0)
MCHC: 33 g/dL (ref 32.0–36.0)
MCV: 90 fL (ref 80–100)
MONO ABS: 0.4 x10 3/mm (ref 0.2–0.9)
Monocyte %: 16.3 %
NEUTROS ABS: 1.1 x10 3/mm — AB (ref 1.4–6.5)
Neutrophil %: 43.4 %
Platelet: 139 x10 3/mm — ABNORMAL LOW (ref 150–440)
RBC: 3.91 10*6/uL (ref 3.80–5.20)
RDW: 16.3 % — AB (ref 11.5–14.5)
WBC: 2.5 x10 3/mm — AB (ref 3.6–11.0)

## 2013-03-27 DIAGNOSIS — Z0271 Encounter for disability determination: Secondary | ICD-10-CM

## 2013-04-01 ENCOUNTER — Ambulatory Visit: Payer: Self-pay | Admitting: Oncology

## 2013-04-02 LAB — COMPREHENSIVE METABOLIC PANEL
AST: 16 U/L (ref 15–37)
Albumin: 3.5 g/dL (ref 3.4–5.0)
Alkaline Phosphatase: 87 U/L
Anion Gap: 11 (ref 7–16)
BUN: 15 mg/dL (ref 7–18)
Bilirubin,Total: 0.4 mg/dL (ref 0.2–1.0)
CALCIUM: 9.2 mg/dL (ref 8.5–10.1)
CHLORIDE: 103 mmol/L (ref 98–107)
CO2: 27 mmol/L (ref 21–32)
Creatinine: 0.67 mg/dL (ref 0.60–1.30)
GLUCOSE: 113 mg/dL — AB (ref 65–99)
OSMOLALITY: 283 (ref 275–301)
Potassium: 3.7 mmol/L (ref 3.5–5.1)
SGPT (ALT): 17 U/L (ref 12–78)
Sodium: 141 mmol/L (ref 136–145)
Total Protein: 6.8 g/dL (ref 6.4–8.2)

## 2013-04-02 LAB — CBC CANCER CENTER
Basophil #: 0.1 x10 3/mm (ref 0.0–0.1)
Basophil %: 1.5 %
EOS ABS: 0.1 x10 3/mm (ref 0.0–0.7)
Eosinophil %: 2.3 %
HCT: 38.6 % (ref 35.0–47.0)
HGB: 12.7 g/dL (ref 12.0–16.0)
LYMPHS PCT: 19.4 %
Lymphocyte #: 0.7 x10 3/mm — ABNORMAL LOW (ref 1.0–3.6)
MCH: 30.1 pg (ref 26.0–34.0)
MCHC: 33 g/dL (ref 32.0–36.0)
MCV: 91 fL (ref 80–100)
MONOS PCT: 15.8 %
Monocyte #: 0.5 x10 3/mm (ref 0.2–0.9)
NEUTROS ABS: 2.1 x10 3/mm (ref 1.4–6.5)
NEUTROS PCT: 61 %
Platelet: 219 x10 3/mm (ref 150–440)
RBC: 4.22 10*6/uL (ref 3.80–5.20)
RDW: 17.9 % — AB (ref 11.5–14.5)
WBC: 3.4 x10 3/mm — ABNORMAL LOW (ref 3.6–11.0)

## 2013-04-02 LAB — PROTEIN, URINE, RANDOM: PROTEIN, RANDOM URINE: 21 mg/dL — AB (ref 0–12)

## 2013-04-09 LAB — CBC CANCER CENTER
Basophil #: 0 x10 3/mm (ref 0.0–0.1)
Basophil %: 1 %
EOS ABS: 0.1 x10 3/mm (ref 0.0–0.7)
EOS PCT: 1.5 %
HCT: 39.1 % (ref 35.0–47.0)
HGB: 12.9 g/dL (ref 12.0–16.0)
Lymphocyte #: 0.8 x10 3/mm — ABNORMAL LOW (ref 1.0–3.6)
Lymphocyte %: 22.6 %
MCH: 29.8 pg (ref 26.0–34.0)
MCHC: 32.9 g/dL (ref 32.0–36.0)
MCV: 91 fL (ref 80–100)
MONO ABS: 0.2 x10 3/mm (ref 0.2–0.9)
Monocyte %: 4.1 %
NEUTROS PCT: 70.8 %
Neutrophil #: 2.6 x10 3/mm (ref 1.4–6.5)
Platelet: 176 x10 3/mm (ref 150–440)
RBC: 4.31 10*6/uL (ref 3.80–5.20)
RDW: 16.8 % — ABNORMAL HIGH (ref 11.5–14.5)
WBC: 3.6 x10 3/mm (ref 3.6–11.0)

## 2013-04-23 LAB — CBC CANCER CENTER
Basophil #: 0 x10 3/mm (ref 0.0–0.1)
Basophil %: 1.5 %
EOS ABS: 0 x10 3/mm (ref 0.0–0.7)
EOS PCT: 0.8 %
HCT: 33.4 % — ABNORMAL LOW (ref 35.0–47.0)
HGB: 11.1 g/dL — ABNORMAL LOW (ref 12.0–16.0)
Lymphocyte #: 0.4 x10 3/mm — ABNORMAL LOW (ref 1.0–3.6)
Lymphocyte %: 23.3 %
MCH: 30.4 pg (ref 26.0–34.0)
MCHC: 33.1 g/dL (ref 32.0–36.0)
MCV: 92 fL (ref 80–100)
Monocyte #: 0.4 x10 3/mm (ref 0.2–0.9)
Monocyte %: 23.8 %
NEUTROS ABS: 0.8 x10 3/mm — AB (ref 1.4–6.5)
Neutrophil %: 50.6 %
PLATELETS: 153 x10 3/mm (ref 150–440)
RBC: 3.64 10*6/uL — AB (ref 3.80–5.20)
RDW: 17.8 % — ABNORMAL HIGH (ref 11.5–14.5)
WBC: 1.5 x10 3/mm — CL (ref 3.6–11.0)

## 2013-04-23 LAB — COMPREHENSIVE METABOLIC PANEL
ALBUMIN: 3 g/dL — AB (ref 3.4–5.0)
ALK PHOS: 78 U/L
ANION GAP: 9 (ref 7–16)
AST: 15 U/L (ref 15–37)
BILIRUBIN TOTAL: 0.3 mg/dL (ref 0.2–1.0)
BUN: 8 mg/dL (ref 7–18)
CHLORIDE: 107 mmol/L (ref 98–107)
CO2: 26 mmol/L (ref 21–32)
Calcium, Total: 8.6 mg/dL (ref 8.5–10.1)
Creatinine: 0.67 mg/dL (ref 0.60–1.30)
EGFR (African American): >60
EGFR (Non-African Amer.): >60
GLUCOSE: 110 mg/dL — AB (ref 65–99)
Osmolality: 282 (ref 275–301)
Potassium: 3.7 mmol/L (ref 3.5–5.1)
SGPT (ALT): 15 U/L (ref 12–78)
Sodium: 142 mmol/L (ref 136–145)
Total Protein: 6.2 g/dL — ABNORMAL LOW (ref 6.4–8.2)

## 2013-04-23 LAB — PROTEIN URINE, QUAL: Protein: NEGATIVE

## 2013-04-27 LAB — CBC CANCER CENTER
BASOS PCT: 0.8 %
Basophil #: 0 x10 3/mm (ref 0.0–0.1)
EOS PCT: 0.2 %
Eosinophil #: 0 x10 3/mm (ref 0.0–0.7)
HCT: 37 % (ref 35.0–47.0)
HGB: 12.4 g/dL (ref 12.0–16.0)
Lymphocyte #: 0.4 x10 3/mm — ABNORMAL LOW (ref 1.0–3.6)
Lymphocyte %: 14.9 %
MCH: 30.4 pg (ref 26.0–34.0)
MCHC: 33.6 g/dL (ref 32.0–36.0)
MCV: 91 fL (ref 80–100)
MONO ABS: 0.3 x10 3/mm (ref 0.2–0.9)
Monocyte %: 10.8 %
NEUTROS ABS: 2 x10 3/mm (ref 1.4–6.5)
Neutrophil %: 73.3 %
PLATELETS: 237 x10 3/mm (ref 150–440)
RBC: 4.08 10*6/uL (ref 3.80–5.20)
RDW: 17.9 % — AB (ref 11.5–14.5)
WBC: 2.8 x10 3/mm — ABNORMAL LOW (ref 3.6–11.0)

## 2013-04-27 LAB — BASIC METABOLIC PANEL
ANION GAP: 12 (ref 7–16)
BUN: 9 mg/dL (ref 7–18)
CREATININE: 0.55 mg/dL — AB (ref 0.60–1.30)
Calcium, Total: 8.8 mg/dL (ref 8.5–10.1)
Chloride: 103 mmol/L (ref 98–107)
Co2: 24 mmol/L (ref 21–32)
EGFR (Non-African Amer.): >60
Glucose: 111 mg/dL — ABNORMAL HIGH (ref 65–99)
Osmolality: 277 (ref 275–301)
Potassium: 4.2 mmol/L (ref 3.5–5.1)
Sodium: 139 mmol/L (ref 136–145)

## 2013-04-30 LAB — CBC CANCER CENTER
BASOS ABS: 0 x10 3/mm (ref 0.0–0.1)
Basophil %: 1 %
Eosinophil #: 0 x10 3/mm (ref 0.0–0.7)
Eosinophil %: 1 %
HCT: 34.8 % — ABNORMAL LOW (ref 35.0–47.0)
HGB: 11.9 g/dL — AB (ref 12.0–16.0)
Lymphocyte #: 0.8 x10 3/mm — ABNORMAL LOW (ref 1.0–3.6)
Lymphocyte %: 19.1 %
MCH: 31 pg (ref 26.0–34.0)
MCHC: 34.2 g/dL (ref 32.0–36.0)
MCV: 91 fL (ref 80–100)
MONOS PCT: 10.4 %
Monocyte #: 0.4 x10 3/mm (ref 0.2–0.9)
NEUTROS ABS: 2.8 x10 3/mm (ref 1.4–6.5)
Neutrophil %: 68.5 %
Platelet: 261 x10 3/mm (ref 150–440)
RBC: 3.83 10*6/uL (ref 3.80–5.20)
RDW: 18.5 % — ABNORMAL HIGH (ref 11.5–14.5)
WBC: 4.1 x10 3/mm (ref 3.6–11.0)

## 2013-04-30 LAB — COMPREHENSIVE METABOLIC PANEL
ALK PHOS: 67 U/L
AST: 12 U/L — AB (ref 15–37)
Albumin: 3.4 g/dL (ref 3.4–5.0)
Anion Gap: 11 (ref 7–16)
BUN: 13 mg/dL (ref 7–18)
Bilirubin,Total: 0.3 mg/dL (ref 0.2–1.0)
Calcium, Total: 8.8 mg/dL (ref 8.5–10.1)
Chloride: 104 mmol/L (ref 98–107)
Co2: 27 mmol/L (ref 21–32)
Creatinine: 0.7 mg/dL (ref 0.60–1.30)
EGFR (African American): >60
EGFR (Non-African Amer.): >60
Glucose: 101 mg/dL — ABNORMAL HIGH (ref 65–99)
OSMOLALITY: 283 (ref 275–301)
Potassium: 3.3 mmol/L — ABNORMAL LOW (ref 3.5–5.1)
SGPT (ALT): 15 U/L (ref 12–78)
Sodium: 142 mmol/L (ref 136–145)
Total Protein: 6.6 g/dL (ref 6.4–8.2)

## 2013-05-01 ENCOUNTER — Ambulatory Visit: Payer: Self-pay | Admitting: Oncology

## 2013-05-07 LAB — CBC CANCER CENTER
BASOS PCT: 1.4 %
Basophil #: 0.1 x10 3/mm (ref 0.0–0.1)
EOS ABS: 0.1 x10 3/mm (ref 0.0–0.7)
Eosinophil %: 1.1 %
HCT: 37.5 % (ref 35.0–47.0)
HGB: 12.6 g/dL (ref 12.0–16.0)
LYMPHS ABS: 1.1 x10 3/mm (ref 1.0–3.6)
LYMPHS PCT: 16 %
MCH: 31.1 pg (ref 26.0–34.0)
MCHC: 33.6 g/dL (ref 32.0–36.0)
MCV: 92 fL (ref 80–100)
Monocyte #: 1.5 x10 3/mm — ABNORMAL HIGH (ref 0.2–0.9)
Monocyte %: 22.6 %
NEUTROS PCT: 58.9 %
Neutrophil #: 3.9 x10 3/mm (ref 1.4–6.5)
PLATELETS: 169 x10 3/mm (ref 150–440)
RBC: 4.05 10*6/uL (ref 3.80–5.20)
RDW: 17.3 % — ABNORMAL HIGH (ref 11.5–14.5)
WBC: 6.7 x10 3/mm (ref 3.6–11.0)

## 2013-05-21 LAB — CBC CANCER CENTER
BASOS PCT: 0.7 %
Basophil #: 0 x10 3/mm (ref 0.0–0.1)
Eosinophil #: 0 x10 3/mm (ref 0.0–0.7)
Eosinophil %: 0.3 %
HCT: 30.9 % — ABNORMAL LOW (ref 35.0–47.0)
HGB: 10.6 g/dL — ABNORMAL LOW (ref 12.0–16.0)
LYMPHS PCT: 16.5 %
Lymphocyte #: 0.7 x10 3/mm — ABNORMAL LOW (ref 1.0–3.6)
MCH: 31.8 pg (ref 26.0–34.0)
MCHC: 34.3 g/dL (ref 32.0–36.0)
MCV: 93 fL (ref 80–100)
MONOS PCT: 10.9 %
Monocyte #: 0.5 x10 3/mm (ref 0.2–0.9)
Neutrophil #: 3.2 x10 3/mm (ref 1.4–6.5)
Neutrophil %: 71.6 %
Platelet: 100 x10 3/mm — ABNORMAL LOW (ref 150–440)
RBC: 3.33 10*6/uL — ABNORMAL LOW (ref 3.80–5.20)
RDW: 18.2 % — ABNORMAL HIGH (ref 11.5–14.5)
WBC: 4.5 x10 3/mm (ref 3.6–11.0)

## 2013-05-21 LAB — COMPREHENSIVE METABOLIC PANEL
ALK PHOS: 67 U/L
ALT: 19 U/L (ref 12–78)
AST: 14 U/L — AB (ref 15–37)
Albumin: 3.3 g/dL — ABNORMAL LOW (ref 3.4–5.0)
Anion Gap: 10 (ref 7–16)
BUN: 11 mg/dL (ref 7–18)
Bilirubin,Total: 0.3 mg/dL (ref 0.2–1.0)
CALCIUM: 8.3 mg/dL — AB (ref 8.5–10.1)
CHLORIDE: 104 mmol/L (ref 98–107)
Co2: 27 mmol/L (ref 21–32)
Creatinine: 0.61 mg/dL (ref 0.60–1.30)
EGFR (African American): >60
EGFR (Non-African Amer.): >60
Glucose: 96 mg/dL (ref 65–99)
Osmolality: 281 (ref 275–301)
Potassium: 3.6 mmol/L (ref 3.5–5.1)
Sodium: 141 mmol/L (ref 136–145)
Total Protein: 6.4 g/dL (ref 6.4–8.2)

## 2013-05-21 LAB — PROTEIN, URINE, RANDOM: Protein, Random Urine: 17 mg/dL — ABNORMAL HIGH

## 2013-05-28 LAB — CBC CANCER CENTER
Basophil #: 0 x10 3/mm (ref 0.0–0.1)
Basophil %: 0.6 %
Eosinophil #: 0 x10 3/mm (ref 0.0–0.7)
Eosinophil %: 0.3 %
HCT: 31.2 % — ABNORMAL LOW (ref 35.0–47.0)
HGB: 10.6 g/dL — AB (ref 12.0–16.0)
LYMPHS ABS: 0.5 x10 3/mm — AB (ref 1.0–3.6)
Lymphocyte %: 12.7 %
MCH: 32.6 pg (ref 26.0–34.0)
MCHC: 34 g/dL (ref 32.0–36.0)
MCV: 96 fL (ref 80–100)
MONO ABS: 0.4 x10 3/mm (ref 0.2–0.9)
MONOS PCT: 10.8 %
NEUTROS PCT: 75.6 %
Neutrophil #: 2.9 x10 3/mm (ref 1.4–6.5)
Platelet: 38 x10 3/mm — ABNORMAL LOW (ref 150–440)
RBC: 3.25 10*6/uL — ABNORMAL LOW (ref 3.80–5.20)
RDW: 17.6 % — ABNORMAL HIGH (ref 11.5–14.5)
WBC: 3.8 x10 3/mm (ref 3.6–11.0)

## 2013-06-01 ENCOUNTER — Ambulatory Visit: Payer: Self-pay | Admitting: Oncology

## 2013-06-07 ENCOUNTER — Inpatient Hospital Stay: Payer: Self-pay | Admitting: Internal Medicine

## 2013-06-07 LAB — CBC WITH DIFFERENTIAL/PLATELET
BASOS ABS: 0 10*3/uL (ref 0.0–0.1)
Basophil %: 0.4 %
Eosinophil #: 0 10*3/uL (ref 0.0–0.7)
Eosinophil %: 0.1 %
HCT: 31 % — ABNORMAL LOW (ref 35.0–47.0)
HGB: 10.3 g/dL — AB (ref 12.0–16.0)
LYMPHS ABS: 0.4 10*3/uL — AB (ref 1.0–3.6)
Lymphocyte %: 10.6 %
MCH: 32.6 pg (ref 26.0–34.0)
MCHC: 33.2 g/dL (ref 32.0–36.0)
MCV: 98 fL (ref 80–100)
Monocyte #: 0.2 x10 3/mm (ref 0.2–0.9)
Monocyte %: 6.4 %
Neutrophil #: 2.8 10*3/uL (ref 1.4–6.5)
Neutrophil %: 82.5 %
Platelet: 43 10*3/uL — ABNORMAL LOW (ref 150–440)
RBC: 3.15 10*6/uL — AB (ref 3.80–5.20)
RDW: 19.8 % — ABNORMAL HIGH (ref 11.5–14.5)
WBC: 3.4 10*3/uL — ABNORMAL LOW (ref 3.6–11.0)

## 2013-06-07 LAB — COMPREHENSIVE METABOLIC PANEL
ANION GAP: 14 (ref 7–16)
Albumin: 3.5 g/dL (ref 3.4–5.0)
Alkaline Phosphatase: 79 U/L
BUN: 7 mg/dL (ref 7–18)
Bilirubin,Total: 0.7 mg/dL (ref 0.2–1.0)
Calcium, Total: 8.7 mg/dL (ref 8.5–10.1)
Chloride: 104 mmol/L (ref 98–107)
Co2: 20 mmol/L — ABNORMAL LOW (ref 21–32)
Creatinine: 0.55 mg/dL — ABNORMAL LOW (ref 0.60–1.30)
EGFR (African American): >60
EGFR (Non-African Amer.): >60
Glucose: 106 mg/dL — ABNORMAL HIGH (ref 65–99)
OSMOLALITY: 274 (ref 275–301)
Potassium: 3.4 mmol/L — ABNORMAL LOW (ref 3.5–5.1)
SGOT(AST): 23 U/L (ref 15–37)
SGPT (ALT): 14 U/L (ref 12–78)
Sodium: 138 mmol/L (ref 136–145)
Total Protein: 6.8 g/dL (ref 6.4–8.2)

## 2013-06-07 LAB — URINALYSIS, COMPLETE
BILIRUBIN, UR: NEGATIVE
Bacteria: NONE SEEN
Glucose,UR: NEGATIVE mg/dL (ref 0–75)
NITRITE: NEGATIVE
PH: 5 (ref 4.5–8.0)
RBC,UR: <1 /HPF (ref 0–5)
SPECIFIC GRAVITY: 1.02 (ref 1.003–1.030)
Squamous Epithelial: 2

## 2013-06-07 LAB — CLOSTRIDIUM DIFFICILE(ARMC)

## 2013-06-07 LAB — MAGNESIUM: MAGNESIUM: 1.8 mg/dL

## 2013-06-07 LAB — TROPONIN I: Troponin-I: <0.02 ng/mL

## 2013-06-08 LAB — CBC WITH DIFFERENTIAL/PLATELET
BASOS ABS: 0 10*3/uL (ref 0.0–0.1)
Basophil %: 0.4 %
EOS ABS: 0 10*3/uL (ref 0.0–0.7)
Eosinophil %: 0.1 %
HCT: 25 % — ABNORMAL LOW (ref 35.0–47.0)
HGB: 8.2 g/dL — AB (ref 12.0–16.0)
LYMPHS ABS: 0.6 10*3/uL — AB (ref 1.0–3.6)
Lymphocyte %: 12.7 %
MCH: 32.3 pg (ref 26.0–34.0)
MCHC: 32.7 g/dL (ref 32.0–36.0)
MCV: 99 fL (ref 80–100)
Monocyte #: 0.5 x10 3/mm (ref 0.2–0.9)
Monocyte %: 11.2 %
NEUTROS PCT: 75.6 %
Neutrophil #: 3.4 10*3/uL (ref 1.4–6.5)
Platelet: 40 10*3/uL — ABNORMAL LOW (ref 150–440)
RBC: 2.53 10*6/uL — AB (ref 3.80–5.20)
RDW: 20.8 % — AB (ref 11.5–14.5)
WBC: 4.5 10*3/uL (ref 3.6–11.0)

## 2013-06-08 LAB — BASIC METABOLIC PANEL
ANION GAP: 8 (ref 7–16)
BUN: 5 mg/dL — ABNORMAL LOW (ref 7–18)
CALCIUM: 7.4 mg/dL — AB (ref 8.5–10.1)
Chloride: 110 mmol/L — ABNORMAL HIGH (ref 98–107)
Co2: 23 mmol/L (ref 21–32)
Creatinine: 0.71 mg/dL (ref 0.60–1.30)
EGFR (Non-African Amer.): >60
Glucose: 93 mg/dL (ref 65–99)
OSMOLALITY: 278 (ref 275–301)
Potassium: 3.1 mmol/L — ABNORMAL LOW (ref 3.5–5.1)
SODIUM: 141 mmol/L (ref 136–145)

## 2013-06-08 LAB — MAGNESIUM: Magnesium: 1.6 mg/dL — ABNORMAL LOW

## 2013-06-08 LAB — URINE CULTURE

## 2013-06-09 LAB — BASIC METABOLIC PANEL
Anion Gap: 8 (ref 7–16)
BUN: 4 mg/dL — AB (ref 7–18)
Calcium, Total: 8 mg/dL — ABNORMAL LOW (ref 8.5–10.1)
Chloride: 110 mmol/L — ABNORMAL HIGH (ref 98–107)
Co2: 22 mmol/L (ref 21–32)
Creatinine: 1.05 mg/dL (ref 0.60–1.30)
EGFR (African American): >60
GLUCOSE: 76 mg/dL (ref 65–99)
Osmolality: 275 (ref 275–301)
POTASSIUM: 3.3 mmol/L — AB (ref 3.5–5.1)
Sodium: 140 mmol/L (ref 136–145)

## 2013-06-09 LAB — MAGNESIUM: MAGNESIUM: 2.8 mg/dL — AB

## 2013-06-10 LAB — EXPECTORATED SPUTUM ASSESSMENT W GRAM STAIN, RFLX TO RESP C

## 2013-06-11 LAB — CBC WITH DIFFERENTIAL/PLATELET
Basophil #: 0 10*3/uL (ref 0.0–0.1)
Basophil %: 0.6 %
EOS ABS: 0 10*3/uL (ref 0.0–0.7)
Eosinophil %: 0.2 %
HCT: 26.4 % — ABNORMAL LOW (ref 35.0–47.0)
HGB: 9 g/dL — ABNORMAL LOW (ref 12.0–16.0)
Lymphocyte #: 0.5 10*3/uL — ABNORMAL LOW (ref 1.0–3.6)
Lymphocyte %: 15.6 %
MCH: 33.9 pg (ref 26.0–34.0)
MCHC: 34.1 g/dL (ref 32.0–36.0)
MCV: 99 fL (ref 80–100)
MONOS PCT: 10.2 %
Monocyte #: 0.3 x10 3/mm (ref 0.2–0.9)
NEUTROS ABS: 2.3 10*3/uL (ref 1.4–6.5)
Neutrophil %: 73.4 %
PLATELETS: 70 10*3/uL — AB (ref 150–440)
RBC: 2.65 10*6/uL — AB (ref 3.80–5.20)
RDW: 21 % — ABNORMAL HIGH (ref 11.5–14.5)
WBC: 3.2 10*3/uL — ABNORMAL LOW (ref 3.6–11.0)

## 2013-06-11 LAB — COMPREHENSIVE METABOLIC PANEL
AST: 17 U/L (ref 15–37)
Albumin: 3.2 g/dL — ABNORMAL LOW (ref 3.4–5.0)
Alkaline Phosphatase: 57 U/L
Anion Gap: 7 (ref 7–16)
BUN: 6 mg/dL — AB (ref 7–18)
Bilirubin,Total: 0.5 mg/dL (ref 0.2–1.0)
CO2: 24 mmol/L (ref 21–32)
Calcium, Total: 8.7 mg/dL (ref 8.5–10.1)
Chloride: 106 mmol/L (ref 98–107)
Creatinine: 0.86 mg/dL (ref 0.60–1.30)
EGFR (African American): >60
EGFR (Non-African Amer.): >60
Glucose: 77 mg/dL (ref 65–99)
Osmolality: 270 (ref 275–301)
POTASSIUM: 3.7 mmol/L (ref 3.5–5.1)
SGPT (ALT): 8 U/L — ABNORMAL LOW (ref 12–78)
SODIUM: 137 mmol/L (ref 136–145)
Total Protein: 6.3 g/dL — ABNORMAL LOW (ref 6.4–8.2)

## 2013-06-11 LAB — TROPONIN I

## 2013-06-12 ENCOUNTER — Inpatient Hospital Stay: Payer: Self-pay | Admitting: Internal Medicine

## 2013-06-12 LAB — URINALYSIS, COMPLETE
BILIRUBIN, UR: NEGATIVE
BLOOD: NEGATIVE
Bacteria: NONE SEEN
Glucose,UR: NEGATIVE mg/dL (ref 0–75)
Leukocyte Esterase: NEGATIVE
Nitrite: NEGATIVE
Ph: 6 (ref 4.5–8.0)
Protein: NEGATIVE
RBC, UR: NONE SEEN /HPF (ref 0–5)
Specific Gravity: 1.009 (ref 1.003–1.030)

## 2013-06-12 LAB — CULTURE, BLOOD (SINGLE)

## 2013-06-13 LAB — CBC WITH DIFFERENTIAL/PLATELET
BASOS ABS: 0 10*3/uL (ref 0.0–0.1)
Basophil %: 0.7 %
Eosinophil #: 0 10*3/uL (ref 0.0–0.7)
Eosinophil %: 1.1 %
HCT: 25.9 % — ABNORMAL LOW (ref 35.0–47.0)
HGB: 8.8 g/dL — ABNORMAL LOW (ref 12.0–16.0)
LYMPHS ABS: 0.5 10*3/uL — AB (ref 1.0–3.6)
Lymphocyte %: 22.2 %
MCH: 33.7 pg (ref 26.0–34.0)
MCHC: 34 g/dL (ref 32.0–36.0)
MCV: 99 fL (ref 80–100)
MONOS PCT: 12.9 %
Monocyte #: 0.3 x10 3/mm (ref 0.2–0.9)
Neutrophil #: 1.5 10*3/uL (ref 1.4–6.5)
Neutrophil %: 63.1 %
Platelet: 83 10*3/uL — ABNORMAL LOW (ref 150–440)
RBC: 2.62 10*6/uL — AB (ref 3.80–5.20)
RDW: 21.4 % — ABNORMAL HIGH (ref 11.5–14.5)
WBC: 2.4 10*3/uL — ABNORMAL LOW (ref 3.6–11.0)

## 2013-06-13 LAB — BASIC METABOLIC PANEL
Anion Gap: 8 (ref 7–16)
BUN: 6 mg/dL — ABNORMAL LOW (ref 7–18)
CALCIUM: 8.2 mg/dL — AB (ref 8.5–10.1)
CHLORIDE: 104 mmol/L (ref 98–107)
CREATININE: 0.76 mg/dL (ref 0.60–1.30)
Co2: 25 mmol/L (ref 21–32)
EGFR (African American): >60
Glucose: 79 mg/dL (ref 65–99)
Osmolality: 270 (ref 275–301)
Potassium: 3.4 mmol/L — ABNORMAL LOW (ref 3.5–5.1)
Sodium: 137 mmol/L (ref 136–145)

## 2013-06-14 LAB — BASIC METABOLIC PANEL
Anion Gap: 6 — ABNORMAL LOW (ref 7–16)
BUN: 5 mg/dL — AB (ref 7–18)
Calcium, Total: 8.2 mg/dL — ABNORMAL LOW (ref 8.5–10.1)
Chloride: 106 mmol/L (ref 98–107)
Co2: 27 mmol/L (ref 21–32)
Creatinine: 0.81 mg/dL (ref 0.60–1.30)
EGFR (African American): >60
EGFR (Non-African Amer.): >60
Glucose: 96 mg/dL (ref 65–99)
OSMOLALITY: 275 (ref 275–301)
Potassium: 3.3 mmol/L — ABNORMAL LOW (ref 3.5–5.1)
Sodium: 139 mmol/L (ref 136–145)

## 2013-06-14 LAB — MAGNESIUM: Magnesium: 1.6 mg/dL — ABNORMAL LOW

## 2013-06-14 LAB — CLOSTRIDIUM DIFFICILE(ARMC)

## 2013-06-15 LAB — MAGNESIUM: MAGNESIUM: 1.9 mg/dL

## 2013-06-15 LAB — POTASSIUM: Potassium: 3.8 mmol/L (ref 3.5–5.1)

## 2013-06-16 LAB — MAGNESIUM: Magnesium: 2 mg/dL

## 2013-06-16 LAB — POTASSIUM: POTASSIUM: 3.9 mmol/L (ref 3.5–5.1)

## 2013-06-17 LAB — COMPREHENSIVE METABOLIC PANEL
ANION GAP: 8 (ref 7–16)
Albumin: 2.8 g/dL — ABNORMAL LOW (ref 3.4–5.0)
Alkaline Phosphatase: 47 U/L
BUN: 5 mg/dL — AB (ref 7–18)
Bilirubin,Total: 0.3 mg/dL (ref 0.2–1.0)
CREATININE: 0.71 mg/dL (ref 0.60–1.30)
Calcium, Total: 8.1 mg/dL — ABNORMAL LOW (ref 8.5–10.1)
Chloride: 106 mmol/L (ref 98–107)
Co2: 26 mmol/L (ref 21–32)
EGFR (African American): >60
EGFR (Non-African Amer.): >60
GLUCOSE: 92 mg/dL (ref 65–99)
Osmolality: 276 (ref 275–301)
POTASSIUM: 3.8 mmol/L (ref 3.5–5.1)
SGOT(AST): 14 U/L — ABNORMAL LOW (ref 15–37)
SGPT (ALT): <6 U/L — ABNORMAL LOW (ref 12–78)
SODIUM: 140 mmol/L (ref 136–145)
Total Protein: 5.9 g/dL — ABNORMAL LOW (ref 6.4–8.2)

## 2013-06-17 LAB — CBC WITH DIFFERENTIAL/PLATELET
BASOS ABS: 0 10*3/uL (ref 0.0–0.1)
Basophil %: 1 %
EOS ABS: 0 10*3/uL (ref 0.0–0.7)
EOS PCT: 1.8 %
HCT: 25.6 % — ABNORMAL LOW (ref 35.0–47.0)
HGB: 8.8 g/dL — ABNORMAL LOW (ref 12.0–16.0)
LYMPHS PCT: 25.2 %
Lymphocyte #: 0.6 10*3/uL — ABNORMAL LOW (ref 1.0–3.6)
MCH: 34 pg (ref 26.0–34.0)
MCHC: 34.4 g/dL (ref 32.0–36.0)
MCV: 99 fL (ref 80–100)
Monocyte #: 0.4 x10 3/mm (ref 0.2–0.9)
Monocyte %: 17.5 %
Neutrophil #: 1.2 10*3/uL — ABNORMAL LOW (ref 1.4–6.5)
Neutrophil %: 54.5 %
PLATELETS: 173 10*3/uL (ref 150–440)
RBC: 2.59 10*6/uL — AB (ref 3.80–5.20)
RDW: 20.9 % — ABNORMAL HIGH (ref 11.5–14.5)
WBC: 2.2 10*3/uL — AB (ref 3.6–11.0)

## 2013-07-01 ENCOUNTER — Ambulatory Visit: Payer: Self-pay | Admitting: Oncology

## 2013-07-02 ENCOUNTER — Ambulatory Visit: Payer: Self-pay | Admitting: Oncology

## 2013-07-07 LAB — CBC CANCER CENTER
BASOS PCT: 0.8 %
Basophil #: 0 x10 3/mm (ref 0.0–0.1)
EOS ABS: 0.1 x10 3/mm (ref 0.0–0.7)
EOS PCT: 2.6 %
HCT: 34.5 % — ABNORMAL LOW (ref 35.0–47.0)
HGB: 11.6 g/dL — ABNORMAL LOW (ref 12.0–16.0)
LYMPHS PCT: 13.9 %
Lymphocyte #: 0.8 x10 3/mm — ABNORMAL LOW (ref 1.0–3.6)
MCH: 34.6 pg — AB (ref 26.0–34.0)
MCHC: 33.6 g/dL (ref 32.0–36.0)
MCV: 103 fL — ABNORMAL HIGH (ref 80–100)
MONO ABS: 0.6 x10 3/mm (ref 0.2–0.9)
MONOS PCT: 10.4 %
NEUTROS ABS: 4 x10 3/mm (ref 1.4–6.5)
Neutrophil %: 72.3 %
Platelet: 220 x10 3/mm (ref 150–440)
RBC: 3.35 10*6/uL — ABNORMAL LOW (ref 3.80–5.20)
RDW: 17.3 % — ABNORMAL HIGH (ref 11.5–14.5)
WBC: 5.6 x10 3/mm (ref 3.6–11.0)

## 2013-07-07 LAB — COMPREHENSIVE METABOLIC PANEL
ALBUMIN: 3.1 g/dL — AB (ref 3.4–5.0)
AST: 13 U/L — AB (ref 15–37)
Alkaline Phosphatase: 72 U/L
Anion Gap: 8 (ref 7–16)
BILIRUBIN TOTAL: 0.4 mg/dL (ref 0.2–1.0)
BUN: 14 mg/dL (ref 7–18)
CREATININE: 0.81 mg/dL (ref 0.60–1.30)
Calcium, Total: 9 mg/dL (ref 8.5–10.1)
Chloride: 106 mmol/L (ref 98–107)
Co2: 27 mmol/L (ref 21–32)
EGFR (African American): >60
EGFR (Non-African Amer.): >60
Glucose: 91 mg/dL (ref 65–99)
Osmolality: 281 (ref 275–301)
Potassium: 3.7 mmol/L (ref 3.5–5.1)
SGPT (ALT): 16 U/L (ref 12–78)
SODIUM: 141 mmol/L (ref 136–145)
Total Protein: 6.8 g/dL (ref 6.4–8.2)

## 2013-07-11 ENCOUNTER — Inpatient Hospital Stay: Payer: Self-pay | Admitting: Internal Medicine

## 2013-07-11 LAB — CBC WITH DIFFERENTIAL/PLATELET
Basophil #: 0 10*3/uL (ref 0.0–0.1)
Basophil %: 0.3 %
EOS ABS: 0 10*3/uL (ref 0.0–0.7)
EOS PCT: 0.3 %
HCT: 44.6 % (ref 35.0–47.0)
HGB: 14.8 g/dL (ref 12.0–16.0)
LYMPHS ABS: 0.7 10*3/uL — AB (ref 1.0–3.6)
Lymphocyte %: 4.7 %
MCH: 35 pg — ABNORMAL HIGH (ref 26.0–34.0)
MCHC: 33.2 g/dL (ref 32.0–36.0)
MCV: 106 fL — ABNORMAL HIGH (ref 80–100)
MONO ABS: 0.1 x10 3/mm — AB (ref 0.2–0.9)
Monocyte %: 0.8 %
Neutrophil #: 13.8 10*3/uL — ABNORMAL HIGH (ref 1.4–6.5)
Neutrophil %: 93.9 %
Platelet: 166 10*3/uL (ref 150–440)
RBC: 4.22 10*6/uL (ref 3.80–5.20)
RDW: 16.1 % — AB (ref 11.5–14.5)
WBC: 14.7 10*3/uL — ABNORMAL HIGH (ref 3.6–11.0)

## 2013-07-11 LAB — COMPREHENSIVE METABOLIC PANEL
ALK PHOS: 179 U/L — AB
Albumin: 3.9 g/dL (ref 3.4–5.0)
Anion Gap: 13 (ref 7–16)
BUN: 13 mg/dL (ref 7–18)
Bilirubin,Total: 1 mg/dL (ref 0.2–1.0)
CO2: 17 mmol/L — AB (ref 21–32)
Calcium, Total: 9.2 mg/dL (ref 8.5–10.1)
Chloride: 104 mmol/L (ref 98–107)
Creatinine: 0.63 mg/dL (ref 0.60–1.30)
GLUCOSE: 100 mg/dL — AB (ref 65–99)
OSMOLALITY: 268 (ref 275–301)
Potassium: 3.9 mmol/L (ref 3.5–5.1)
SGOT(AST): 19 U/L (ref 15–37)
SGPT (ALT): 18 U/L (ref 12–78)
Sodium: 134 mmol/L — ABNORMAL LOW (ref 136–145)
Total Protein: 8.1 g/dL (ref 6.4–8.2)

## 2013-07-11 LAB — PRO B NATRIURETIC PEPTIDE: B-Type Natriuretic Peptide: 105 pg/mL (ref 0–125)

## 2013-07-11 LAB — LIPASE, BLOOD: LIPASE: 132 U/L (ref 73–393)

## 2013-07-11 LAB — TROPONIN I: Troponin-I: <0.02 ng/mL

## 2013-07-12 LAB — CBC WITH DIFFERENTIAL/PLATELET
Basophil #: 0.1 10*3/uL (ref 0.0–0.1)
Basophil %: 0.9 %
Eosinophil #: 0.1 10*3/uL (ref 0.0–0.7)
Eosinophil %: 1.5 %
HCT: 33.8 % — AB (ref 35.0–47.0)
HGB: 11.3 g/dL — ABNORMAL LOW (ref 12.0–16.0)
LYMPHS ABS: 1 10*3/uL (ref 1.0–3.6)
LYMPHS PCT: 13.2 %
MCH: 35 pg — ABNORMAL HIGH (ref 26.0–34.0)
MCHC: 33.5 g/dL (ref 32.0–36.0)
MCV: 105 fL — AB (ref 80–100)
Monocyte #: 0.1 x10 3/mm — ABNORMAL LOW (ref 0.2–0.9)
Monocyte %: 1.4 %
NEUTROS PCT: 83 %
Neutrophil #: 6.1 10*3/uL (ref 1.4–6.5)
PLATELETS: 146 10*3/uL — AB (ref 150–440)
RBC: 3.23 10*6/uL — AB (ref 3.80–5.20)
RDW: 15.7 % — ABNORMAL HIGH (ref 11.5–14.5)
WBC: 7.4 10*3/uL (ref 3.6–11.0)

## 2013-07-13 LAB — SODIUM: Sodium: 137 mmol/L

## 2013-07-13 LAB — PLATELET COUNT: Platelet: 122 x10 3/mm 3 — ABNORMAL LOW

## 2013-07-13 LAB — HEMOGLOBIN: HGB: 10.4 g/dL — AB (ref 12.0–16.0)

## 2013-07-15 LAB — CBC WITH DIFFERENTIAL/PLATELET
BASOS PCT: 0.5 %
Basophil #: 0 10*3/uL (ref 0.0–0.1)
Eosinophil #: 0 10*3/uL (ref 0.0–0.7)
Eosinophil %: 0.7 %
HCT: 31.9 % — ABNORMAL LOW (ref 35.0–47.0)
HGB: 10.6 g/dL — ABNORMAL LOW (ref 12.0–16.0)
LYMPHS ABS: 0.7 10*3/uL — AB (ref 1.0–3.6)
LYMPHS PCT: 14 %
MCH: 34.8 pg — AB (ref 26.0–34.0)
MCHC: 33.4 g/dL (ref 32.0–36.0)
MCV: 104 fL — AB (ref 80–100)
Monocyte #: 1.1 x10 3/mm — ABNORMAL HIGH (ref 0.2–0.9)
Monocyte %: 21.1 %
NEUTROS PCT: 63.7 %
Neutrophil #: 3.3 10*3/uL (ref 1.4–6.5)
Platelet: 135 10*3/uL — ABNORMAL LOW (ref 150–440)
RBC: 3.06 10*6/uL — AB (ref 3.80–5.20)
RDW: 15.4 % — ABNORMAL HIGH (ref 11.5–14.5)
WBC: 5.2 10*3/uL (ref 3.6–11.0)

## 2013-07-15 LAB — BASIC METABOLIC PANEL
Anion Gap: 8 (ref 7–16)
BUN: 10 mg/dL (ref 7–18)
CHLORIDE: 109 mmol/L — AB (ref 98–107)
CREATININE: 0.72 mg/dL (ref 0.60–1.30)
Calcium, Total: 8.4 mg/dL — ABNORMAL LOW (ref 8.5–10.1)
Co2: 21 mmol/L (ref 21–32)
EGFR (Non-African Amer.): >60
GLUCOSE: 112 mg/dL — AB (ref 65–99)
Osmolality: 275 (ref 275–301)
Potassium: 4 mmol/L (ref 3.5–5.1)
Sodium: 138 mmol/L (ref 136–145)

## 2013-07-15 LAB — HEPATIC FUNCTION PANEL A (ARMC)
ALBUMIN: 3.4 g/dL (ref 3.4–5.0)
AST: 9 U/L — AB (ref 15–37)
Alkaline Phosphatase: 77 U/L
BILIRUBIN TOTAL: 0.2 mg/dL (ref 0.2–1.0)
Bilirubin, Direct: <0.1 mg/dL (ref 0.00–0.20)
SGPT (ALT): 19 U/L (ref 12–78)
TOTAL PROTEIN: 6.8 g/dL (ref 6.4–8.2)

## 2013-07-15 LAB — OCCULT BLOOD X 1 CARD TO LAB, STOOL: Occult Blood, Feces: NEGATIVE

## 2013-07-16 LAB — HCG, QUANTITATIVE, PREGNANCY: Beta Hcg, Quant.: <1 m[IU]/mL — ABNORMAL LOW

## 2013-07-16 LAB — CULTURE, BLOOD (SINGLE)

## 2013-07-19 LAB — EXPECTORATED SPUTUM ASSESSMENT W REFEX TO RESP CULTURE

## 2013-07-28 LAB — CBC CANCER CENTER
Basophil #: 0.1 x10 3/mm (ref 0.0–0.1)
Basophil %: 1.2 %
EOS PCT: 0.3 %
Eosinophil #: 0 x10 3/mm (ref 0.0–0.7)
HCT: 35.1 % (ref 35.0–47.0)
HGB: 11.7 g/dL — ABNORMAL LOW (ref 12.0–16.0)
Lymphocyte #: 0.8 x10 3/mm — ABNORMAL LOW (ref 1.0–3.6)
Lymphocyte %: 14.1 %
MCH: 35.1 pg — ABNORMAL HIGH (ref 26.0–34.0)
MCHC: 33.3 g/dL (ref 32.0–36.0)
MCV: 105 fL — ABNORMAL HIGH (ref 80–100)
MONO ABS: 0.4 x10 3/mm (ref 0.2–0.9)
Monocyte %: 8 %
NEUTROS ABS: 4.3 x10 3/mm (ref 1.4–6.5)
NEUTROS PCT: 76.4 %
Platelet: 129 x10 3/mm — ABNORMAL LOW (ref 150–440)
RBC: 3.34 10*6/uL — ABNORMAL LOW (ref 3.80–5.20)
RDW: 16 % — AB (ref 11.5–14.5)
WBC: 5.6 x10 3/mm (ref 3.6–11.0)

## 2013-07-28 LAB — COMPREHENSIVE METABOLIC PANEL
ANION GAP: 9 (ref 7–16)
AST: 12 U/L — AB (ref 15–37)
Albumin: 3.1 g/dL — ABNORMAL LOW (ref 3.4–5.0)
Alkaline Phosphatase: 67 U/L
BILIRUBIN TOTAL: 0.3 mg/dL (ref 0.2–1.0)
BUN: 16 mg/dL (ref 7–18)
CHLORIDE: 105 mmol/L (ref 98–107)
Calcium, Total: 8.4 mg/dL — ABNORMAL LOW (ref 8.5–10.1)
Co2: 26 mmol/L (ref 21–32)
Creatinine: 0.64 mg/dL (ref 0.60–1.30)
EGFR (African American): >60
GLUCOSE: 90 mg/dL (ref 65–99)
Osmolality: 280 (ref 275–301)
POTASSIUM: 4 mmol/L (ref 3.5–5.1)
SGPT (ALT): 21 U/L
Sodium: 140 mmol/L (ref 136–145)
Total Protein: 6.3 g/dL — ABNORMAL LOW (ref 6.4–8.2)

## 2013-08-01 ENCOUNTER — Ambulatory Visit: Payer: Self-pay | Admitting: Oncology

## 2013-08-08 ENCOUNTER — Inpatient Hospital Stay: Payer: Self-pay | Admitting: Internal Medicine

## 2013-08-08 LAB — COMPREHENSIVE METABOLIC PANEL
Albumin: 2.8 g/dL — ABNORMAL LOW (ref 3.4–5.0)
Alkaline Phosphatase: 71 U/L
Anion Gap: 11 (ref 7–16)
BUN: 5 mg/dL — ABNORMAL LOW (ref 7–18)
Bilirubin,Total: 0.7 mg/dL (ref 0.2–1.0)
Calcium, Total: 8.3 mg/dL — ABNORMAL LOW (ref 8.5–10.1)
Chloride: 106 mmol/L (ref 98–107)
Co2: 22 mmol/L (ref 21–32)
Creatinine: 0.58 mg/dL — ABNORMAL LOW (ref 0.60–1.30)
EGFR (African American): >60
EGFR (Non-African Amer.): >60
GLUCOSE: 105 mg/dL — AB (ref 65–99)
OSMOLALITY: 275 (ref 275–301)
POTASSIUM: 3.4 mmol/L — AB (ref 3.5–5.1)
SGOT(AST): 20 U/L (ref 15–37)
SGPT (ALT): 14 U/L
Sodium: 139 mmol/L (ref 136–145)
Total Protein: 6.5 g/dL (ref 6.4–8.2)

## 2013-08-08 LAB — CBC WITH DIFFERENTIAL/PLATELET
BASOS ABS: 0 10*3/uL (ref 0.0–0.1)
BASOS PCT: 0.5 %
EOS PCT: 0.2 %
Eosinophil #: 0 10*3/uL (ref 0.0–0.7)
HCT: 34.2 % — AB (ref 35.0–47.0)
HGB: 11.3 g/dL — ABNORMAL LOW (ref 12.0–16.0)
Lymphocyte #: 0.3 10*3/uL — ABNORMAL LOW (ref 1.0–3.6)
Lymphocyte %: 8.7 %
MCH: 34.1 pg — ABNORMAL HIGH (ref 26.0–34.0)
MCHC: 33 g/dL (ref 32.0–36.0)
MCV: 103 fL — ABNORMAL HIGH (ref 80–100)
MONO ABS: 0.2 x10 3/mm (ref 0.2–0.9)
Monocyte %: 5 %
NEUTROS PCT: 85.6 %
Neutrophil #: 3.2 10*3/uL (ref 1.4–6.5)
PLATELETS: 141 10*3/uL — AB (ref 150–440)
RBC: 3.31 10*6/uL — ABNORMAL LOW (ref 3.80–5.20)
RDW: 15.8 % — ABNORMAL HIGH (ref 11.5–14.5)
WBC: 3.7 10*3/uL (ref 3.6–11.0)

## 2013-08-08 LAB — LIPASE, BLOOD: LIPASE: 58 U/L — AB (ref 73–393)

## 2013-08-09 LAB — BASIC METABOLIC PANEL
Anion Gap: 12 (ref 7–16)
BUN: 5 mg/dL — AB (ref 7–18)
CALCIUM: 7.8 mg/dL — AB (ref 8.5–10.1)
CREATININE: 0.66 mg/dL (ref 0.60–1.30)
Chloride: 111 mmol/L — ABNORMAL HIGH (ref 98–107)
Co2: 22 mmol/L (ref 21–32)
EGFR (Non-African Amer.): >60
Glucose: 79 mg/dL (ref 65–99)
Osmolality: 285 (ref 275–301)
POTASSIUM: 3.4 mmol/L — AB (ref 3.5–5.1)
SODIUM: 145 mmol/L (ref 136–145)

## 2013-08-09 LAB — CBC WITH DIFFERENTIAL/PLATELET
BASOS PCT: 1.4 %
Basophil #: 0 10*3/uL (ref 0.0–0.1)
EOS PCT: 3.1 %
Eosinophil #: 0.1 10*3/uL (ref 0.0–0.7)
HCT: 29.6 % — ABNORMAL LOW (ref 35.0–47.0)
HGB: 10.1 g/dL — ABNORMAL LOW (ref 12.0–16.0)
Lymphocyte #: 0.6 10*3/uL — ABNORMAL LOW (ref 1.0–3.6)
Lymphocyte %: 29.4 %
MCH: 35 pg — AB (ref 26.0–34.0)
MCHC: 34.1 g/dL (ref 32.0–36.0)
MCV: 103 fL — ABNORMAL HIGH (ref 80–100)
MONOS PCT: 13 %
Monocyte #: 0.3 x10 3/mm (ref 0.2–0.9)
Neutrophil #: 1.1 10*3/uL — ABNORMAL LOW (ref 1.4–6.5)
Neutrophil %: 53.1 %
Platelet: 128 10*3/uL — ABNORMAL LOW (ref 150–440)
RBC: 2.89 10*6/uL — ABNORMAL LOW (ref 3.80–5.20)
RDW: 16 % — ABNORMAL HIGH (ref 11.5–14.5)
WBC: 2 10*3/uL — CL (ref 3.6–11.0)

## 2013-08-09 LAB — MAGNESIUM: MAGNESIUM: 1.7 mg/dL — AB

## 2013-08-10 LAB — BASIC METABOLIC PANEL
Anion Gap: 13 (ref 7–16)
BUN: 5 mg/dL — ABNORMAL LOW (ref 7–18)
CHLORIDE: 111 mmol/L — AB (ref 98–107)
CO2: 18 mmol/L — AB (ref 21–32)
Calcium, Total: 7.7 mg/dL — ABNORMAL LOW (ref 8.5–10.1)
Creatinine: 0.58 mg/dL — ABNORMAL LOW (ref 0.60–1.30)
EGFR (Non-African Amer.): >60
GLUCOSE: 61 mg/dL — AB (ref 65–99)
Osmolality: 278 (ref 275–301)
Potassium: 4.1 mmol/L (ref 3.5–5.1)
SODIUM: 142 mmol/L (ref 136–145)

## 2013-08-10 LAB — MAGNESIUM: Magnesium: 1.8 mg/dL

## 2013-08-12 LAB — CBC WITH DIFFERENTIAL/PLATELET
Basophil #: 0 10*3/uL (ref 0.0–0.1)
Basophil %: 0.2 %
EOS PCT: 0 %
Eosinophil #: 0 10*3/uL (ref 0.0–0.7)
HCT: 33.8 % — AB (ref 35.0–47.0)
HGB: 11 g/dL — ABNORMAL LOW (ref 12.0–16.0)
LYMPHS ABS: 0.4 10*3/uL — AB (ref 1.0–3.6)
Lymphocyte %: 3.9 %
MCH: 33.7 pg (ref 26.0–34.0)
MCHC: 32.4 g/dL (ref 32.0–36.0)
MCV: 104 fL — ABNORMAL HIGH (ref 80–100)
MONOS PCT: 3.4 %
Monocyte #: 0.4 x10 3/mm (ref 0.2–0.9)
Neutrophil #: 10.5 10*3/uL — ABNORMAL HIGH (ref 1.4–6.5)
Neutrophil %: 92.5 %
Platelet: 169 10*3/uL (ref 150–440)
RBC: 3.26 10*6/uL — ABNORMAL LOW (ref 3.80–5.20)
RDW: 15.8 % — ABNORMAL HIGH (ref 11.5–14.5)
WBC: 11.4 10*3/uL — ABNORMAL HIGH (ref 3.6–11.0)

## 2013-08-12 LAB — COMPREHENSIVE METABOLIC PANEL
ANION GAP: 5 — AB (ref 7–16)
AST: 11 U/L — AB (ref 15–37)
Albumin: 3.3 g/dL — ABNORMAL LOW (ref 3.4–5.0)
Alkaline Phosphatase: 75 U/L
BILIRUBIN TOTAL: 0.3 mg/dL (ref 0.2–1.0)
BUN: 10 mg/dL (ref 7–18)
CO2: 25 mmol/L (ref 21–32)
CREATININE: 0.81 mg/dL (ref 0.60–1.30)
Calcium, Total: 8.4 mg/dL — ABNORMAL LOW (ref 8.5–10.1)
Chloride: 108 mmol/L — ABNORMAL HIGH (ref 98–107)
Glucose: 119 mg/dL — ABNORMAL HIGH (ref 65–99)
OSMOLALITY: 276 (ref 275–301)
Potassium: 4.2 mmol/L (ref 3.5–5.1)
SGPT (ALT): 13 U/L — ABNORMAL LOW
SODIUM: 138 mmol/L (ref 136–145)
Total Protein: 6.4 g/dL (ref 6.4–8.2)

## 2013-08-12 LAB — MAGNESIUM: MAGNESIUM: 1.6 mg/dL — AB

## 2013-08-13 LAB — CULTURE, BLOOD (SINGLE)

## 2013-08-18 LAB — CBC CANCER CENTER
BASOS PCT: 0.9 %
Basophil #: 0 x10 3/mm (ref 0.0–0.1)
EOS ABS: 0.1 x10 3/mm (ref 0.0–0.7)
Eosinophil %: 1.4 %
HCT: 38.1 % (ref 35.0–47.0)
HGB: 12.6 g/dL (ref 12.0–16.0)
Lymphocyte #: 0.9 x10 3/mm — ABNORMAL LOW (ref 1.0–3.6)
Lymphocyte %: 17.7 %
MCH: 33.4 pg (ref 26.0–34.0)
MCHC: 33 g/dL (ref 32.0–36.0)
MCV: 101 fL — ABNORMAL HIGH (ref 80–100)
MONO ABS: 0.4 x10 3/mm (ref 0.2–0.9)
Monocyte %: 7.3 %
Neutrophil #: 3.8 x10 3/mm (ref 1.4–6.5)
Neutrophil %: 72.7 %
Platelet: 193 x10 3/mm (ref 150–440)
RBC: 3.76 10*6/uL — AB (ref 3.80–5.20)
RDW: 15.7 % — AB (ref 11.5–14.5)
WBC: 5.3 x10 3/mm (ref 3.6–11.0)

## 2013-08-18 LAB — BASIC METABOLIC PANEL
Anion Gap: 10 (ref 7–16)
BUN: 13 mg/dL (ref 7–18)
CREATININE: 0.81 mg/dL (ref 0.60–1.30)
Calcium, Total: 8.5 mg/dL (ref 8.5–10.1)
Chloride: 101 mmol/L (ref 98–107)
Co2: 27 mmol/L (ref 21–32)
EGFR (African American): >60
Glucose: 93 mg/dL (ref 65–99)
Osmolality: 275 (ref 275–301)
POTASSIUM: 3.5 mmol/L (ref 3.5–5.1)
SODIUM: 138 mmol/L (ref 136–145)

## 2013-08-18 LAB — PROTEIN, URINE, RANDOM: Protein, Random Urine: 40 mg/dL — ABNORMAL HIGH (ref 0–12)

## 2013-08-20 ENCOUNTER — Observation Stay: Payer: Self-pay | Admitting: Internal Medicine

## 2013-08-20 LAB — CBC WITH DIFFERENTIAL/PLATELET
BASOS ABS: 0 10*3/uL (ref 0.0–0.1)
Basophil %: 0.4 %
EOS PCT: 1 %
Eosinophil #: 0 10*3/uL (ref 0.0–0.7)
HCT: 34.5 % — AB (ref 35.0–47.0)
HGB: 11.3 g/dL — AB (ref 12.0–16.0)
LYMPHS PCT: 11.8 %
Lymphocyte #: 0.6 10*3/uL — ABNORMAL LOW (ref 1.0–3.6)
MCH: 33.7 pg (ref 26.0–34.0)
MCHC: 32.8 g/dL (ref 32.0–36.0)
MCV: 103 fL — AB (ref 80–100)
Monocyte #: 0.2 x10 3/mm (ref 0.2–0.9)
Monocyte %: 5 %
Neutrophil #: 3.8 10*3/uL (ref 1.4–6.5)
Neutrophil %: 81.8 %
PLATELETS: 170 10*3/uL (ref 150–440)
RBC: 3.36 10*6/uL — ABNORMAL LOW (ref 3.80–5.20)
RDW: 15.9 % — AB (ref 11.5–14.5)
WBC: 4.7 10*3/uL (ref 3.6–11.0)

## 2013-08-20 LAB — COMPREHENSIVE METABOLIC PANEL
ALT: 11 U/L — AB
ANION GAP: 7 (ref 7–16)
Albumin: 2.9 g/dL — ABNORMAL LOW (ref 3.4–5.0)
Alkaline Phosphatase: 44 U/L — ABNORMAL LOW
BUN: 6 mg/dL — AB (ref 7–18)
Bilirubin,Total: 0.4 mg/dL (ref 0.2–1.0)
CO2: 25 mmol/L (ref 21–32)
CREATININE: 0.63 mg/dL (ref 0.60–1.30)
Calcium, Total: 8.3 mg/dL — ABNORMAL LOW (ref 8.5–10.1)
Chloride: 108 mmol/L — ABNORMAL HIGH (ref 98–107)
EGFR (Non-African Amer.): >60
GLUCOSE: 91 mg/dL (ref 65–99)
OSMOLALITY: 277 (ref 275–301)
Potassium: 3.7 mmol/L (ref 3.5–5.1)
SGOT(AST): 17 U/L (ref 15–37)
Sodium: 140 mmol/L (ref 136–145)
Total Protein: 5.8 g/dL — ABNORMAL LOW (ref 6.4–8.2)

## 2013-08-20 LAB — MAGNESIUM: MAGNESIUM: 1.7 mg/dL — AB

## 2013-08-20 LAB — PHOSPHORUS: PHOSPHORUS: 2.9 mg/dL (ref 2.5–4.9)

## 2013-08-21 LAB — BASIC METABOLIC PANEL
Anion Gap: 7 (ref 7–16)
BUN: 6 mg/dL — ABNORMAL LOW (ref 7–18)
CREATININE: 0.92 mg/dL (ref 0.60–1.30)
Calcium, Total: 8.4 mg/dL — ABNORMAL LOW (ref 8.5–10.1)
Chloride: 108 mmol/L — ABNORMAL HIGH (ref 98–107)
Co2: 22 mmol/L (ref 21–32)
EGFR (African American): >60
EGFR (Non-African Amer.): >60
Glucose: 175 mg/dL — ABNORMAL HIGH (ref 65–99)
Osmolality: 276 (ref 275–301)
POTASSIUM: 4.4 mmol/L (ref 3.5–5.1)
SODIUM: 137 mmol/L (ref 136–145)

## 2013-08-21 LAB — CBC WITH DIFFERENTIAL/PLATELET
BASOS PCT: 0.4 %
Basophil #: 0 10*3/uL (ref 0.0–0.1)
Eosinophil #: 0 10*3/uL (ref 0.0–0.7)
Eosinophil %: 0.2 %
HCT: 35.9 % (ref 35.0–47.0)
HGB: 11.6 g/dL — AB (ref 12.0–16.0)
LYMPHS ABS: 0.4 10*3/uL — AB (ref 1.0–3.6)
LYMPHS PCT: 11.6 %
MCH: 33.4 pg (ref 26.0–34.0)
MCHC: 32.3 g/dL (ref 32.0–36.0)
MCV: 103 fL — AB (ref 80–100)
MONO ABS: 0 x10 3/mm — AB (ref 0.2–0.9)
MONOS PCT: 1.6 %
Neutrophil #: 2.6 10*3/uL (ref 1.4–6.5)
Neutrophil %: 86.2 %
PLATELETS: 179 10*3/uL (ref 150–440)
RBC: 3.47 10*6/uL — ABNORMAL LOW (ref 3.80–5.20)
RDW: 15.7 % — ABNORMAL HIGH (ref 11.5–14.5)
WBC: 3 10*3/uL — ABNORMAL LOW (ref 3.6–11.0)

## 2013-08-21 LAB — MAGNESIUM: Magnesium: 1.6 mg/dL — ABNORMAL LOW

## 2013-08-28 LAB — CBC CANCER CENTER
Basophil #: 0 x10 3/mm (ref 0.0–0.1)
Basophil %: 0.3 %
EOS PCT: 0.7 %
Eosinophil #: 0 x10 3/mm (ref 0.0–0.7)
HCT: 37.6 % (ref 35.0–47.0)
HGB: 12.5 g/dL (ref 12.0–16.0)
Lymphocyte #: 0.7 x10 3/mm — ABNORMAL LOW (ref 1.0–3.6)
Lymphocyte %: 9.6 %
MCH: 33.7 pg (ref 26.0–34.0)
MCHC: 33.2 g/dL (ref 32.0–36.0)
MCV: 102 fL — AB (ref 80–100)
MONO ABS: 0.7 x10 3/mm (ref 0.2–0.9)
Monocyte %: 9.6 %
NEUTROS ABS: 5.5 x10 3/mm (ref 1.4–6.5)
Neutrophil %: 79.8 %
Platelet: 207 x10 3/mm (ref 150–440)
RBC: 3.69 10*6/uL — AB (ref 3.80–5.20)
RDW: 15.6 % — ABNORMAL HIGH (ref 11.5–14.5)
WBC: 7 x10 3/mm (ref 3.6–11.0)

## 2013-08-28 LAB — BASIC METABOLIC PANEL
Anion Gap: 12 (ref 7–16)
BUN: 17 mg/dL (ref 7–18)
Calcium, Total: 8.4 mg/dL — ABNORMAL LOW (ref 8.5–10.1)
Chloride: 104 mmol/L (ref 98–107)
Co2: 26 mmol/L (ref 21–32)
Creatinine: 0.82 mg/dL (ref 0.60–1.30)
EGFR (African American): >60
EGFR (Non-African Amer.): >60
Glucose: 123 mg/dL — ABNORMAL HIGH (ref 65–99)
Osmolality: 286 (ref 275–301)
POTASSIUM: 3.1 mmol/L — AB (ref 3.5–5.1)
Sodium: 142 mmol/L (ref 136–145)

## 2013-09-01 ENCOUNTER — Ambulatory Visit: Payer: Self-pay | Admitting: Oncology

## 2013-09-08 LAB — CBC CANCER CENTER
BASOS ABS: 0 x10 3/mm (ref 0.0–0.1)
Basophil %: 0.2 %
EOS ABS: 0 x10 3/mm (ref 0.0–0.7)
EOS PCT: 0 %
HCT: 40.4 % (ref 35.0–47.0)
HGB: 13.2 g/dL (ref 12.0–16.0)
LYMPHS ABS: 0.4 x10 3/mm — AB (ref 1.0–3.6)
LYMPHS PCT: 3.4 %
MCH: 33 pg (ref 26.0–34.0)
MCHC: 32.8 g/dL (ref 32.0–36.0)
MCV: 101 fL — ABNORMAL HIGH (ref 80–100)
Monocyte #: 0.4 x10 3/mm (ref 0.2–0.9)
Monocyte %: 3.6 %
NEUTROS PCT: 92.8 %
Neutrophil #: 10.3 x10 3/mm — ABNORMAL HIGH (ref 1.4–6.5)
PLATELETS: 147 x10 3/mm — AB (ref 150–440)
RBC: 4.01 10*6/uL (ref 3.80–5.20)
RDW: 16.3 % — ABNORMAL HIGH (ref 11.5–14.5)
WBC: 11.1 x10 3/mm — ABNORMAL HIGH (ref 3.6–11.0)

## 2013-09-08 LAB — BASIC METABOLIC PANEL
ANION GAP: 7 (ref 7–16)
BUN: 23 mg/dL — AB (ref 7–18)
CO2: 29 mmol/L (ref 21–32)
Calcium, Total: 7.5 mg/dL — ABNORMAL LOW (ref 8.5–10.1)
Chloride: 100 mmol/L (ref 98–107)
Creatinine: 0.77 mg/dL (ref 0.60–1.30)
Glucose: 143 mg/dL — ABNORMAL HIGH (ref 65–99)
OSMOLALITY: 278 (ref 275–301)
POTASSIUM: 4.2 mmol/L (ref 3.5–5.1)
Sodium: 136 mmol/L (ref 136–145)

## 2013-09-08 LAB — PROTEIN, URINE, RANDOM: Protein, Random Urine: 24 mg/dL — ABNORMAL HIGH (ref 0–12)

## 2013-09-11 ENCOUNTER — Emergency Department: Payer: Self-pay | Admitting: Emergency Medicine

## 2013-09-12 ENCOUNTER — Emergency Department: Payer: Self-pay | Admitting: Emergency Medicine

## 2013-09-12 LAB — CBC WITH DIFFERENTIAL/PLATELET
Basophil #: 0 10*3/uL (ref 0.0–0.1)
Basophil %: 0.3 %
EOS ABS: 0 10*3/uL (ref 0.0–0.7)
EOS PCT: 0.3 %
HCT: 38.6 % (ref 35.0–47.0)
HGB: 12.9 g/dL (ref 12.0–16.0)
Lymphocyte #: 0.4 10*3/uL — ABNORMAL LOW (ref 1.0–3.6)
Lymphocyte %: 4.9 %
MCH: 33.5 pg (ref 26.0–34.0)
MCHC: 33.5 g/dL (ref 32.0–36.0)
MCV: 100 fL (ref 80–100)
MONOS PCT: 3.7 %
Monocyte #: 0.3 x10 3/mm (ref 0.2–0.9)
Neutrophil #: 6.5 10*3/uL (ref 1.4–6.5)
Neutrophil %: 90.8 %
PLATELETS: 122 10*3/uL — AB (ref 150–440)
RBC: 3.86 10*6/uL (ref 3.80–5.20)
RDW: 16.4 % — ABNORMAL HIGH (ref 11.5–14.5)
WBC: 7.2 10*3/uL (ref 3.6–11.0)

## 2013-09-12 LAB — COMPREHENSIVE METABOLIC PANEL
ALBUMIN: 2.7 g/dL — AB (ref 3.4–5.0)
ALK PHOS: 69 U/L
AST: 41 U/L — AB (ref 15–37)
Anion Gap: 9 (ref 7–16)
BILIRUBIN TOTAL: 0.4 mg/dL (ref 0.2–1.0)
BUN: 26 mg/dL — AB (ref 7–18)
CALCIUM: 8 mg/dL — AB (ref 8.5–10.1)
CHLORIDE: 104 mmol/L (ref 98–107)
CO2: 23 mmol/L (ref 21–32)
CREATININE: 0.55 mg/dL — AB (ref 0.60–1.30)
EGFR (African American): >60
EGFR (Non-African Amer.): >60
Glucose: 149 mg/dL — ABNORMAL HIGH (ref 65–99)
OSMOLALITY: 280 (ref 275–301)
Potassium: 4 mmol/L (ref 3.5–5.1)
SGPT (ALT): 50 U/L
Sodium: 136 mmol/L (ref 136–145)
Total Protein: 5.5 g/dL — ABNORMAL LOW (ref 6.4–8.2)

## 2013-09-27 ENCOUNTER — Observation Stay: Payer: Self-pay | Admitting: Internal Medicine

## 2013-09-27 LAB — URINALYSIS, COMPLETE
BACTERIA: NONE SEEN
Bilirubin,UR: NEGATIVE
Blood: NEGATIVE
Glucose,UR: NEGATIVE mg/dL (ref 0–75)
Leukocyte Esterase: NEGATIVE
Nitrite: NEGATIVE
Ph: 7 (ref 4.5–8.0)
Protein: 100
RBC,UR: <1 /HPF (ref 0–5)
SPECIFIC GRAVITY: 1.017 (ref 1.003–1.030)
WBC UR: 1 /HPF (ref 0–5)

## 2013-09-27 LAB — CBC
HCT: 40.5 % (ref 35.0–47.0)
HGB: 13.2 g/dL (ref 12.0–16.0)
MCH: 32 pg (ref 26.0–34.0)
MCHC: 32.7 g/dL (ref 32.0–36.0)
MCV: 98 fL (ref 80–100)
PLATELETS: 81 10*3/uL — AB (ref 150–440)
RBC: 4.13 10*6/uL (ref 3.80–5.20)
RDW: 17.1 % — AB (ref 11.5–14.5)
WBC: 3.2 10*3/uL — AB (ref 3.6–11.0)

## 2013-09-27 LAB — COMPREHENSIVE METABOLIC PANEL
ALBUMIN: 2.9 g/dL — AB (ref 3.4–5.0)
ANION GAP: 7 (ref 7–16)
AST: 22 U/L (ref 15–37)
Alkaline Phosphatase: 57 U/L
BILIRUBIN TOTAL: 0.9 mg/dL (ref 0.2–1.0)
BUN: 15 mg/dL (ref 7–18)
CREATININE: 0.57 mg/dL — AB (ref 0.60–1.30)
Calcium, Total: 8.6 mg/dL (ref 8.5–10.1)
Chloride: 107 mmol/L (ref 98–107)
Co2: 25 mmol/L (ref 21–32)
EGFR (African American): >60
Glucose: 109 mg/dL — ABNORMAL HIGH (ref 65–99)
OSMOLALITY: 279 (ref 275–301)
Potassium: 3.9 mmol/L (ref 3.5–5.1)
SGPT (ALT): 48 U/L
Sodium: 139 mmol/L (ref 136–145)
Total Protein: 6.4 g/dL (ref 6.4–8.2)

## 2013-09-27 LAB — DIFFERENTIAL
BASOS ABS: 0 10*3/uL (ref 0.0–0.1)
Basophil %: 0.6 %
EOS ABS: 0 10*3/uL (ref 0.0–0.7)
Eosinophil %: 0.6 %
LYMPHS PCT: 16.8 %
Lymphocyte #: 0.5 10*3/uL — ABNORMAL LOW (ref 1.0–3.6)
Monocyte #: 0.2 x10 3/mm (ref 0.2–0.9)
Monocyte %: 6.2 %
NEUTROS ABS: 2.4 10*3/uL (ref 1.4–6.5)
Neutrophil %: 75.8 %

## 2013-09-27 LAB — TROPONIN I: Troponin-I: <0.02 ng/mL

## 2013-09-28 LAB — URINE CULTURE

## 2013-09-29 LAB — CBC CANCER CENTER
BASOS PCT: 0.8 %
Basophil #: 0 x10 3/mm (ref 0.0–0.1)
Eosinophil #: 0 x10 3/mm (ref 0.0–0.7)
Eosinophil %: 1.2 %
HCT: 40.1 % (ref 35.0–47.0)
HGB: 13.2 g/dL (ref 12.0–16.0)
LYMPHS PCT: 35.2 %
Lymphocyte #: 1.2 x10 3/mm (ref 1.0–3.6)
MCH: 32.3 pg (ref 26.0–34.0)
MCHC: 33 g/dL (ref 32.0–36.0)
MCV: 98 fL (ref 80–100)
Monocyte #: 0.3 x10 3/mm (ref 0.2–0.9)
Monocyte %: 10.2 %
NEUTROS ABS: 1.8 x10 3/mm (ref 1.4–6.5)
Neutrophil %: 52.6 %
Platelet: 106 x10 3/mm — ABNORMAL LOW (ref 150–440)
RBC: 4.1 10*6/uL (ref 3.80–5.20)
RDW: 17.4 % — AB (ref 11.5–14.5)
WBC: 3.3 x10 3/mm — ABNORMAL LOW (ref 3.6–11.0)

## 2013-09-29 LAB — BASIC METABOLIC PANEL
ANION GAP: 11 (ref 7–16)
BUN: 17 mg/dL (ref 7–18)
CHLORIDE: 107 mmol/L (ref 98–107)
CREATININE: 0.85 mg/dL (ref 0.60–1.30)
Calcium, Total: 8.2 mg/dL — ABNORMAL LOW (ref 8.5–10.1)
Co2: 23 mmol/L (ref 21–32)
EGFR (African American): >60
EGFR (Non-African Amer.): >60
Glucose: 131 mg/dL — ABNORMAL HIGH (ref 65–99)
Osmolality: 285 (ref 275–301)
Potassium: 3.5 mmol/L (ref 3.5–5.1)
Sodium: 141 mmol/L (ref 136–145)

## 2013-09-29 LAB — PROTEIN, URINE, RANDOM: Protein, Random Urine: 63 mg/dL — ABNORMAL HIGH (ref 0–12)

## 2013-10-01 ENCOUNTER — Ambulatory Visit: Payer: Self-pay | Admitting: Oncology

## 2013-10-02 LAB — CULTURE, BLOOD (SINGLE)

## 2013-10-06 ENCOUNTER — Ambulatory Visit: Payer: Self-pay | Admitting: Oncology

## 2013-10-20 LAB — CBC CANCER CENTER
Basophil #: 0 x10 3/mm (ref 0.0–0.1)
Basophil %: 0.7 %
EOS ABS: 0 x10 3/mm (ref 0.0–0.7)
Eosinophil %: 1 %
HCT: 34.2 % — AB (ref 35.0–47.0)
HGB: 11 g/dL — ABNORMAL LOW (ref 12.0–16.0)
LYMPHS ABS: 0.9 x10 3/mm — AB (ref 1.0–3.6)
Lymphocyte %: 39.2 %
MCH: 30.7 pg (ref 26.0–34.0)
MCHC: 32 g/dL (ref 32.0–36.0)
MCV: 96 fL (ref 80–100)
MONO ABS: 0.5 x10 3/mm (ref 0.2–0.9)
Monocyte %: 19.4 %
Neutrophil #: 0.9 x10 3/mm — ABNORMAL LOW (ref 1.4–6.5)
Neutrophil %: 39.7 %
PLATELETS: 193 x10 3/mm (ref 150–440)
RBC: 3.57 10*6/uL — ABNORMAL LOW (ref 3.80–5.20)
RDW: 19.6 % — ABNORMAL HIGH (ref 11.5–14.5)
WBC: 2.3 x10 3/mm — AB (ref 3.6–11.0)

## 2013-10-20 LAB — BASIC METABOLIC PANEL
ANION GAP: 8 (ref 7–16)
BUN: 7 mg/dL (ref 7–18)
CREATININE: 0.68 mg/dL (ref 0.60–1.30)
Calcium, Total: 8.1 mg/dL — ABNORMAL LOW (ref 8.5–10.1)
Chloride: 106 mmol/L (ref 98–107)
Co2: 28 mmol/L (ref 21–32)
EGFR (African American): >60
EGFR (Non-African Amer.): >60
Glucose: 88 mg/dL (ref 65–99)
OSMOLALITY: 281 (ref 275–301)
Potassium: 3 mmol/L — ABNORMAL LOW (ref 3.5–5.1)
SODIUM: 142 mmol/L (ref 136–145)

## 2013-10-20 LAB — PROTEIN, URINE, RANDOM: Protein, Random Urine: 67 mg/dL — ABNORMAL HIGH (ref 0–12)

## 2013-10-21 ENCOUNTER — Inpatient Hospital Stay: Payer: Self-pay | Admitting: Oncology

## 2013-10-21 LAB — CBC WITH DIFFERENTIAL/PLATELET
BASOS ABS: 0 10*3/uL (ref 0.0–0.1)
BASOS PCT: 0.4 %
Eosinophil #: 0 10*3/uL (ref 0.0–0.7)
Eosinophil %: 0.1 %
HCT: 35.7 % (ref 35.0–47.0)
HGB: 11.5 g/dL — AB (ref 12.0–16.0)
LYMPHS ABS: 0.4 10*3/uL — AB (ref 1.0–3.6)
Lymphocyte %: 13.6 %
MCH: 30.7 pg (ref 26.0–34.0)
MCHC: 32.3 g/dL (ref 32.0–36.0)
MCV: 95 fL (ref 80–100)
MONO ABS: 0.2 x10 3/mm (ref 0.2–0.9)
MONOS PCT: 7.4 %
NEUTROS ABS: 2.4 10*3/uL (ref 1.4–6.5)
Neutrophil %: 78.5 %
PLATELETS: 198 10*3/uL (ref 150–440)
RBC: 3.76 10*6/uL — ABNORMAL LOW (ref 3.80–5.20)
RDW: 19.5 % — ABNORMAL HIGH (ref 11.5–14.5)
WBC: 3.1 10*3/uL — ABNORMAL LOW (ref 3.6–11.0)

## 2013-10-21 LAB — COMPREHENSIVE METABOLIC PANEL
ALBUMIN: 3 g/dL — AB (ref 3.4–5.0)
Alkaline Phosphatase: 57 U/L
Anion Gap: 9 (ref 7–16)
BUN: 5 mg/dL — AB (ref 7–18)
Bilirubin,Total: 0.5 mg/dL (ref 0.2–1.0)
CO2: 24 mmol/L (ref 21–32)
Calcium, Total: 8.2 mg/dL — ABNORMAL LOW (ref 8.5–10.1)
Chloride: 108 mmol/L — ABNORMAL HIGH (ref 98–107)
Creatinine: 0.48 mg/dL — ABNORMAL LOW (ref 0.60–1.30)
EGFR (African American): >60
Glucose: 108 mg/dL — ABNORMAL HIGH (ref 65–99)
Osmolality: 279 (ref 275–301)
POTASSIUM: 3 mmol/L — AB (ref 3.5–5.1)
SGOT(AST): 30 U/L (ref 15–37)
SGPT (ALT): 21 U/L
SODIUM: 141 mmol/L (ref 136–145)
Total Protein: 6.1 g/dL — ABNORMAL LOW (ref 6.4–8.2)

## 2013-10-22 LAB — BASIC METABOLIC PANEL
ANION GAP: 9 (ref 7–16)
BUN: 5 mg/dL — AB (ref 7–18)
CHLORIDE: 111 mmol/L — AB (ref 98–107)
CO2: 25 mmol/L (ref 21–32)
Calcium, Total: 7.2 mg/dL — ABNORMAL LOW (ref 8.5–10.1)
Creatinine: 0.53 mg/dL — ABNORMAL LOW (ref 0.60–1.30)
EGFR (African American): >60
EGFR (Non-African Amer.): >60
Glucose: 75 mg/dL (ref 65–99)
OSMOLALITY: 285 (ref 275–301)
Potassium: 3.1 mmol/L — ABNORMAL LOW (ref 3.5–5.1)
SODIUM: 145 mmol/L (ref 136–145)

## 2013-10-22 LAB — URINALYSIS, COMPLETE
BILIRUBIN, UR: NEGATIVE
Blood: NEGATIVE
GLUCOSE, UR: NEGATIVE mg/dL (ref 0–75)
LEUKOCYTE ESTERASE: NEGATIVE
Nitrite: NEGATIVE
Ph: 6 (ref 4.5–8.0)
Protein: NEGATIVE
SPECIFIC GRAVITY: 1.016 (ref 1.003–1.030)
Squamous Epithelial: 1
WBC UR: 1 /HPF (ref 0–5)

## 2013-10-23 LAB — BASIC METABOLIC PANEL
Anion Gap: 8 (ref 7–16)
BUN: 4 mg/dL — ABNORMAL LOW (ref 7–18)
CALCIUM: 7.3 mg/dL — AB (ref 8.5–10.1)
Chloride: 113 mmol/L — ABNORMAL HIGH (ref 98–107)
Co2: 25 mmol/L (ref 21–32)
Creatinine: 0.56 mg/dL — ABNORMAL LOW (ref 0.60–1.30)
EGFR (African American): >60
EGFR (Non-African Amer.): >60
GLUCOSE: 77 mg/dL (ref 65–99)
Osmolality: 286 (ref 275–301)
POTASSIUM: 3.3 mmol/L — AB (ref 3.5–5.1)
SODIUM: 146 mmol/L — AB (ref 136–145)

## 2013-10-24 LAB — BASIC METABOLIC PANEL
ANION GAP: 7 (ref 7–16)
BUN: 5 mg/dL — AB (ref 7–18)
CALCIUM: 7.1 mg/dL — AB (ref 8.5–10.1)
Chloride: 115 mmol/L — ABNORMAL HIGH (ref 98–107)
Co2: 27 mmol/L (ref 21–32)
Creatinine: 0.6 mg/dL (ref 0.60–1.30)
EGFR (Non-African Amer.): >60
Glucose: 89 mg/dL (ref 65–99)
Osmolality: 293 (ref 275–301)
POTASSIUM: 3.6 mmol/L (ref 3.5–5.1)
SODIUM: 149 mmol/L — AB (ref 136–145)

## 2013-10-25 LAB — BASIC METABOLIC PANEL
Anion Gap: 13 (ref 7–16)
BUN: 3 mg/dL — ABNORMAL LOW (ref 7–18)
CALCIUM: 7.4 mg/dL — AB (ref 8.5–10.1)
CHLORIDE: 107 mmol/L (ref 98–107)
CO2: 22 mmol/L (ref 21–32)
Creatinine: 0.52 mg/dL — ABNORMAL LOW (ref 0.60–1.30)
Glucose: 77 mg/dL (ref 65–99)
OSMOLALITY: 278 (ref 275–301)
Potassium: 3.7 mmol/L (ref 3.5–5.1)
Sodium: 142 mmol/L (ref 136–145)

## 2013-10-25 LAB — PLATELET COUNT: Platelet: 243 10*3/uL (ref 150–440)

## 2013-10-27 LAB — BASIC METABOLIC PANEL
ANION GAP: 10 (ref 7–16)
BUN: 1 mg/dL — ABNORMAL LOW (ref 7–18)
CALCIUM: 8.4 mg/dL — AB (ref 8.5–10.1)
CO2: 23 mmol/L (ref 21–32)
Chloride: 104 mmol/L (ref 98–107)
Creatinine: 0.53 mg/dL — ABNORMAL LOW (ref 0.60–1.30)
EGFR (Non-African Amer.): >60
Glucose: 95 mg/dL (ref 65–99)
Osmolality: 269 (ref 275–301)
Potassium: 4.3 mmol/L (ref 3.5–5.1)
Sodium: 137 mmol/L (ref 136–145)

## 2013-10-28 LAB — CBC WITH DIFFERENTIAL/PLATELET
Basophil #: 0 10*3/uL (ref 0.0–0.1)
Basophil %: 1 %
EOS PCT: 2.3 %
Eosinophil #: 0.1 10*3/uL (ref 0.0–0.7)
HCT: 36.3 % (ref 35.0–47.0)
HGB: 11.4 g/dL — AB (ref 12.0–16.0)
Lymphocyte #: 0.8 10*3/uL — ABNORMAL LOW (ref 1.0–3.6)
Lymphocyte %: 17.4 %
MCH: 30.2 pg (ref 26.0–34.0)
MCHC: 31.4 g/dL — AB (ref 32.0–36.0)
MCV: 96 fL (ref 80–100)
Monocyte #: 0.6 x10 3/mm (ref 0.2–0.9)
Monocyte %: 12.2 %
NEUTROS PCT: 67.1 %
Neutrophil #: 3.2 10*3/uL (ref 1.4–6.5)
Platelet: 239 10*3/uL (ref 150–440)
RBC: 3.78 10*6/uL — AB (ref 3.80–5.20)
RDW: 19.7 % — AB (ref 11.5–14.5)
WBC: 4.8 10*3/uL (ref 3.6–11.0)

## 2013-10-28 LAB — BASIC METABOLIC PANEL
ANION GAP: 11 (ref 7–16)
BUN: <1 mg/dL — ABNORMAL LOW (ref 7–18)
CALCIUM: 8.4 mg/dL — AB (ref 8.5–10.1)
CREATININE: 0.54 mg/dL — AB (ref 0.60–1.30)
Chloride: 102 mmol/L (ref 98–107)
Co2: 24 mmol/L (ref 21–32)
EGFR (African American): >60
GLUCOSE: 86 mg/dL (ref 65–99)
Potassium: 4.4 mmol/L (ref 3.5–5.1)
SODIUM: 137 mmol/L (ref 136–145)

## 2013-10-31 DIAGNOSIS — R000 Tachycardia, unspecified: Secondary | ICD-10-CM

## 2013-11-01 ENCOUNTER — Ambulatory Visit: Payer: Self-pay | Admitting: Oncology

## 2013-11-01 LAB — CBC WITH DIFFERENTIAL/PLATELET
Basophil #: 0.1 10*3/uL (ref 0.0–0.1)
Basophil %: 2 %
Eosinophil #: 0.1 10*3/uL (ref 0.0–0.7)
Eosinophil %: 2.6 %
HCT: 33.9 % — AB (ref 35.0–47.0)
HGB: 10.8 g/dL — ABNORMAL LOW (ref 12.0–16.0)
Lymphocyte #: 0.8 10*3/uL — ABNORMAL LOW (ref 1.0–3.6)
Lymphocyte %: 23.4 %
MCH: 30.7 pg (ref 26.0–34.0)
MCHC: 32 g/dL (ref 32.0–36.0)
MCV: 96 fL (ref 80–100)
MONOS PCT: 16.9 %
Monocyte #: 0.5 x10 3/mm (ref 0.2–0.9)
NEUTROS ABS: 1.8 10*3/uL (ref 1.4–6.5)
NEUTROS PCT: 55.1 %
PLATELETS: 202 10*3/uL (ref 150–440)
RBC: 3.53 10*6/uL — ABNORMAL LOW (ref 3.80–5.20)
RDW: 19 % — ABNORMAL HIGH (ref 11.5–14.5)
WBC: 3.2 10*3/uL — ABNORMAL LOW (ref 3.6–11.0)

## 2013-11-01 LAB — BASIC METABOLIC PANEL
Anion Gap: 8 (ref 7–16)
BUN: 3 mg/dL — AB (ref 7–18)
CALCIUM: 8.2 mg/dL — AB (ref 8.5–10.1)
CO2: 30 mmol/L (ref 21–32)
CREATININE: 0.7 mg/dL (ref 0.60–1.30)
Chloride: 105 mmol/L (ref 98–107)
EGFR (African American): >60
EGFR (Non-African Amer.): >60
GLUCOSE: 112 mg/dL — AB (ref 65–99)
Osmolality: 282 (ref 275–301)
Potassium: 3.4 mmol/L — ABNORMAL LOW (ref 3.5–5.1)
Sodium: 143 mmol/L (ref 136–145)

## 2013-11-01 LAB — CK: CK, TOTAL: 24 U/L — AB

## 2013-11-10 LAB — BASIC METABOLIC PANEL
ANION GAP: 4 — AB (ref 7–16)
BUN: 9 mg/dL (ref 7–18)
CHLORIDE: 102 mmol/L (ref 98–107)
Calcium, Total: 8.2 mg/dL — ABNORMAL LOW (ref 8.5–10.1)
Co2: 36 mmol/L — ABNORMAL HIGH (ref 21–32)
Creatinine: 0.64 mg/dL (ref 0.60–1.30)
EGFR (African American): >60
EGFR (Non-African Amer.): >60
Glucose: 114 mg/dL — ABNORMAL HIGH (ref 65–99)
OSMOLALITY: 283 (ref 275–301)
Potassium: 4 mmol/L (ref 3.5–5.1)
SODIUM: 142 mmol/L (ref 136–145)

## 2013-11-10 LAB — CBC CANCER CENTER
Basophil #: 0 x10 3/mm (ref 0.0–0.1)
Basophil %: 0.7 %
Eosinophil #: 0.3 x10 3/mm (ref 0.0–0.7)
Eosinophil %: 5.1 %
HCT: 34.5 % — AB (ref 35.0–47.0)
HGB: 11.2 g/dL — AB (ref 12.0–16.0)
LYMPHS PCT: 19 %
Lymphocyte #: 1 x10 3/mm (ref 1.0–3.6)
MCH: 31.2 pg (ref 26.0–34.0)
MCHC: 32.5 g/dL (ref 32.0–36.0)
MCV: 96 fL (ref 80–100)
MONO ABS: 0.5 x10 3/mm (ref 0.2–0.9)
MONOS PCT: 9.3 %
NEUTROS ABS: 3.4 x10 3/mm (ref 1.4–6.5)
Neutrophil %: 65.9 %
Platelet: 217 x10 3/mm (ref 150–440)
RBC: 3.6 10*6/uL — ABNORMAL LOW (ref 3.80–5.20)
RDW: 18.9 % — AB (ref 11.5–14.5)
WBC: 5.1 x10 3/mm (ref 3.6–11.0)

## 2013-11-10 LAB — PROTEIN, URINE, RANDOM: PROTEIN, RANDOM URINE: 12 mg/dL (ref 0–12)

## 2013-11-14 ENCOUNTER — Emergency Department: Payer: Self-pay | Admitting: Emergency Medicine

## 2013-11-14 LAB — COMPREHENSIVE METABOLIC PANEL
ALK PHOS: 70 U/L
Albumin: 2.7 g/dL — ABNORMAL LOW (ref 3.4–5.0)
Anion Gap: 7 (ref 7–16)
BILIRUBIN TOTAL: 0.3 mg/dL (ref 0.2–1.0)
BUN: 7 mg/dL (ref 7–18)
CHLORIDE: 105 mmol/L (ref 98–107)
CREATININE: 0.61 mg/dL (ref 0.60–1.30)
Calcium, Total: 8.1 mg/dL — ABNORMAL LOW (ref 8.5–10.1)
Co2: 29 mmol/L (ref 21–32)
GLUCOSE: 85 mg/dL (ref 65–99)
Osmolality: 278 (ref 275–301)
Potassium: 3.8 mmol/L (ref 3.5–5.1)
SGOT(AST): 27 U/L (ref 15–37)
SGPT (ALT): 21 U/L
Sodium: 141 mmol/L (ref 136–145)
TOTAL PROTEIN: 6.1 g/dL — AB (ref 6.4–8.2)

## 2013-11-14 LAB — URINALYSIS, COMPLETE
BLOOD: NEGATIVE
Bilirubin,UR: NEGATIVE
Glucose,UR: NEGATIVE mg/dL (ref 0–75)
Ketone: NEGATIVE
Nitrite: NEGATIVE
Ph: 7 (ref 4.5–8.0)
RBC,UR: 3 /HPF (ref 0–5)
Specific Gravity: 1.02 (ref 1.003–1.030)
WBC UR: 33 /HPF (ref 0–5)

## 2013-11-14 LAB — CBC
HCT: 36.6 % (ref 35.0–47.0)
HGB: 11.7 g/dL — ABNORMAL LOW (ref 12.0–16.0)
MCH: 30.5 pg (ref 26.0–34.0)
MCHC: 32 g/dL (ref 32.0–36.0)
MCV: 95 fL (ref 80–100)
Platelet: 188 10*3/uL (ref 150–440)
RBC: 3.85 10*6/uL (ref 3.80–5.20)
RDW: 18 % — ABNORMAL HIGH (ref 11.5–14.5)
WBC: 5.4 10*3/uL (ref 3.6–11.0)

## 2013-11-24 LAB — CBC CANCER CENTER
BASOS ABS: 0 x10 3/mm (ref 0.0–0.1)
Basophil %: 0.6 %
EOS ABS: 0 x10 3/mm (ref 0.0–0.7)
Eosinophil %: 0.7 %
HCT: 39.2 % (ref 35.0–47.0)
HGB: 12.6 g/dL (ref 12.0–16.0)
Lymphocyte #: 0.6 x10 3/mm — ABNORMAL LOW (ref 1.0–3.6)
Lymphocyte %: 8.5 %
MCH: 30.3 pg (ref 26.0–34.0)
MCHC: 32.2 g/dL (ref 32.0–36.0)
MCV: 94 fL (ref 80–100)
Monocyte #: 0.2 x10 3/mm (ref 0.2–0.9)
Monocyte %: 3.3 %
NEUTROS ABS: 6.4 x10 3/mm (ref 1.4–6.5)
Neutrophil %: 86.9 %
Platelet: 246 x10 3/mm (ref 150–440)
RBC: 4.17 10*6/uL (ref 3.80–5.20)
RDW: 17.7 % — AB (ref 11.5–14.5)
WBC: 7.4 x10 3/mm (ref 3.6–11.0)

## 2013-11-24 LAB — BASIC METABOLIC PANEL
Anion Gap: 10 (ref 7–16)
BUN: 11 mg/dL (ref 7–18)
CALCIUM: 9.1 mg/dL (ref 8.5–10.1)
CO2: 27 mmol/L (ref 21–32)
CREATININE: 0.69 mg/dL (ref 0.60–1.30)
Chloride: 102 mmol/L (ref 98–107)
EGFR (Non-African Amer.): >60
Glucose: 128 mg/dL — ABNORMAL HIGH (ref 65–99)
Osmolality: 279 (ref 275–301)
Potassium: 4 mmol/L (ref 3.5–5.1)
SODIUM: 139 mmol/L (ref 136–145)

## 2013-11-30 LAB — CBC CANCER CENTER
BASOS ABS: 0 x10 3/mm (ref 0.0–0.1)
BASOS PCT: 0.3 %
EOS PCT: 0.2 %
Eosinophil #: 0 x10 3/mm (ref 0.0–0.7)
HCT: 45 % (ref 35.0–47.0)
HGB: 14.5 g/dL (ref 12.0–16.0)
LYMPHS ABS: 0.9 x10 3/mm — AB (ref 1.0–3.6)
LYMPHS PCT: 6.4 %
MCH: 30.2 pg (ref 26.0–34.0)
MCHC: 32.2 g/dL (ref 32.0–36.0)
MCV: 94 fL (ref 80–100)
MONO ABS: 0.6 x10 3/mm (ref 0.2–0.9)
Monocyte %: 4.2 %
Neutrophil #: 12.1 x10 3/mm — ABNORMAL HIGH (ref 1.4–6.5)
Neutrophil %: 88.9 %
PLATELETS: 279 x10 3/mm (ref 150–440)
RBC: 4.81 10*6/uL (ref 3.80–5.20)
RDW: 17.8 % — AB (ref 11.5–14.5)
WBC: 13.6 x10 3/mm — ABNORMAL HIGH (ref 3.6–11.0)

## 2013-11-30 LAB — BASIC METABOLIC PANEL
Anion Gap: 10 (ref 7–16)
BUN: 14 mg/dL (ref 7–18)
CALCIUM: 9.1 mg/dL (ref 8.5–10.1)
CHLORIDE: 102 mmol/L (ref 98–107)
CO2: 28 mmol/L (ref 21–32)
Creatinine: 0.74 mg/dL (ref 0.60–1.30)
GLUCOSE: 139 mg/dL — AB (ref 65–99)
Osmolality: 282 (ref 275–301)
Potassium: 4 mmol/L (ref 3.5–5.1)
Sodium: 140 mmol/L (ref 136–145)

## 2013-12-01 ENCOUNTER — Ambulatory Visit: Payer: Self-pay | Admitting: Oncology

## 2013-12-08 LAB — CBC CANCER CENTER
BASOS ABS: 0.1 x10 3/mm (ref 0.0–0.1)
BASOS PCT: 1.3 %
EOS PCT: 0.8 %
Eosinophil #: 0.1 x10 3/mm (ref 0.0–0.7)
HCT: 40.3 % (ref 35.0–47.0)
HGB: 12.9 g/dL (ref 12.0–16.0)
Lymphocyte #: 0.7 x10 3/mm — ABNORMAL LOW (ref 1.0–3.6)
Lymphocyte %: 8.8 %
MCH: 29.9 pg (ref 26.0–34.0)
MCHC: 32.1 g/dL (ref 32.0–36.0)
MCV: 93 fL (ref 80–100)
MONOS PCT: 5.1 %
Monocyte #: 0.4 x10 3/mm (ref 0.2–0.9)
NEUTROS PCT: 84 %
Neutrophil #: 6.2 x10 3/mm (ref 1.4–6.5)
PLATELETS: 148 x10 3/mm — AB (ref 150–440)
RBC: 4.32 10*6/uL (ref 3.80–5.20)
RDW: 17.4 % — ABNORMAL HIGH (ref 11.5–14.5)
WBC: 7.4 x10 3/mm (ref 3.6–11.0)

## 2013-12-08 LAB — COMPREHENSIVE METABOLIC PANEL
Albumin: 3.2 g/dL — ABNORMAL LOW (ref 3.4–5.0)
Alkaline Phosphatase: 76 U/L
Anion Gap: 7 (ref 7–16)
BUN: 20 mg/dL — ABNORMAL HIGH (ref 7–18)
Bilirubin,Total: 0.2 mg/dL (ref 0.2–1.0)
CO2: 29 mmol/L (ref 21–32)
CREATININE: 0.77 mg/dL (ref 0.60–1.30)
Calcium, Total: 8.9 mg/dL (ref 8.5–10.1)
Chloride: 105 mmol/L (ref 98–107)
Glucose: 172 mg/dL — ABNORMAL HIGH (ref 65–99)
OSMOLALITY: 288 (ref 275–301)
POTASSIUM: 4 mmol/L (ref 3.5–5.1)
SGOT(AST): 15 U/L (ref 15–37)
SGPT (ALT): 26 U/L
Sodium: 141 mmol/L (ref 136–145)
Total Protein: 5.9 g/dL — ABNORMAL LOW (ref 6.4–8.2)

## 2013-12-10 ENCOUNTER — Encounter (HOSPITAL_COMMUNITY): Payer: Self-pay | Admitting: Cardiovascular Disease

## 2013-12-20 ENCOUNTER — Inpatient Hospital Stay: Payer: Self-pay | Admitting: Specialist

## 2013-12-20 LAB — LIPASE, BLOOD: Lipase: 55 U/L — ABNORMAL LOW (ref 73–393)

## 2013-12-20 LAB — CK TOTAL AND CKMB (NOT AT ARMC)
CK, TOTAL: 166 U/L (ref 26–192)
CK-MB: 0.7 ng/mL (ref 0.5–3.6)

## 2013-12-20 LAB — COMPREHENSIVE METABOLIC PANEL
ALBUMIN: 2.7 g/dL — AB (ref 3.4–5.0)
Alkaline Phosphatase: 61 U/L
Anion Gap: 8 (ref 7–16)
BUN: 8 mg/dL (ref 7–18)
Bilirubin,Total: 0.5 mg/dL (ref 0.2–1.0)
CALCIUM: 7.7 mg/dL — AB (ref 8.5–10.1)
CHLORIDE: 106 mmol/L (ref 98–107)
CO2: 27 mmol/L (ref 21–32)
CREATININE: 0.63 mg/dL (ref 0.60–1.30)
EGFR (African American): >60
Glucose: 91 mg/dL (ref 65–99)
Osmolality: 279 (ref 275–301)
POTASSIUM: 3.5 mmol/L (ref 3.5–5.1)
SGOT(AST): 29 U/L (ref 15–37)
SGPT (ALT): 23 U/L
SODIUM: 141 mmol/L (ref 136–145)
Total Protein: 5.5 g/dL — ABNORMAL LOW (ref 6.4–8.2)

## 2013-12-20 LAB — CBC
HCT: 37.3 % (ref 35.0–47.0)
HGB: 12.1 g/dL (ref 12.0–16.0)
MCH: 29.8 pg (ref 26.0–34.0)
MCHC: 32.3 g/dL (ref 32.0–36.0)
MCV: 92 fL (ref 80–100)
Platelet: 163 10*3/uL (ref 150–440)
RBC: 4.04 10*6/uL (ref 3.80–5.20)
RDW: 17.5 % — AB (ref 11.5–14.5)
WBC: 4 10*3/uL (ref 3.6–11.0)

## 2013-12-20 LAB — URINALYSIS, COMPLETE
BLOOD: NEGATIVE
Bacteria: NONE SEEN
Bilirubin,UR: NEGATIVE
GLUCOSE, UR: NEGATIVE mg/dL (ref 0–75)
Nitrite: NEGATIVE
PH: 5 (ref 4.5–8.0)
SPECIFIC GRAVITY: 1.045 (ref 1.003–1.030)
Squamous Epithelial: 5

## 2013-12-20 LAB — TROPONIN I

## 2013-12-21 LAB — BASIC METABOLIC PANEL
Anion Gap: 6 — ABNORMAL LOW (ref 7–16)
BUN: 4 mg/dL — ABNORMAL LOW (ref 7–18)
CALCIUM: 7.2 mg/dL — AB (ref 8.5–10.1)
Chloride: 107 mmol/L (ref 98–107)
Co2: 30 mmol/L (ref 21–32)
Creatinine: 0.64 mg/dL (ref 0.60–1.30)
EGFR (African American): >60
EGFR (Non-African Amer.): >60
GLUCOSE: 99 mg/dL (ref 65–99)
Osmolality: 282 (ref 275–301)
Potassium: 2.9 mmol/L — ABNORMAL LOW (ref 3.5–5.1)
Sodium: 143 mmol/L (ref 136–145)

## 2013-12-21 LAB — CBC WITH DIFFERENTIAL/PLATELET
BASOS ABS: 0 10*3/uL (ref 0.0–0.1)
Basophil %: 0.7 %
EOS ABS: 0.1 10*3/uL (ref 0.0–0.7)
Eosinophil %: 2.9 %
HCT: 33.6 % — AB (ref 35.0–47.0)
HGB: 10.9 g/dL — ABNORMAL LOW (ref 12.0–16.0)
LYMPHS PCT: 21.1 %
Lymphocyte #: 0.5 10*3/uL — ABNORMAL LOW (ref 1.0–3.6)
MCH: 30.2 pg (ref 26.0–34.0)
MCHC: 32.4 g/dL (ref 32.0–36.0)
MCV: 93 fL (ref 80–100)
MONO ABS: 0.2 x10 3/mm (ref 0.2–0.9)
Monocyte %: 8.8 %
NEUTROS ABS: 1.5 10*3/uL (ref 1.4–6.5)
Neutrophil %: 66.5 %
Platelet: 139 10*3/uL — ABNORMAL LOW (ref 150–440)
RBC: 3.61 10*6/uL — ABNORMAL LOW (ref 3.80–5.20)
RDW: 17.3 % — ABNORMAL HIGH (ref 11.5–14.5)
WBC: 2.3 10*3/uL — AB (ref 3.6–11.0)

## 2013-12-22 LAB — POTASSIUM: Potassium: 3.4 mmol/L — ABNORMAL LOW (ref 3.5–5.1)

## 2013-12-22 LAB — VANCOMYCIN, TROUGH: Vancomycin, Trough: 17 ug/mL (ref 10–20)

## 2013-12-23 LAB — POTASSIUM: Potassium: 3.8 mmol/L (ref 3.5–5.1)

## 2013-12-25 LAB — CULTURE, BLOOD (SINGLE)

## 2013-12-29 ENCOUNTER — Inpatient Hospital Stay: Payer: Self-pay | Admitting: Internal Medicine

## 2013-12-29 LAB — COMPREHENSIVE METABOLIC PANEL
ALBUMIN: 3 g/dL — AB (ref 3.4–5.0)
ALT: 11 U/L — AB
Alkaline Phosphatase: 58 U/L
Anion Gap: 10 (ref 7–16)
BUN: 5 mg/dL — ABNORMAL LOW (ref 7–18)
Bilirubin,Total: 0.3 mg/dL (ref 0.2–1.0)
CALCIUM: 8.8 mg/dL (ref 8.5–10.1)
CREATININE: 0.73 mg/dL (ref 0.60–1.30)
Chloride: 110 mmol/L — ABNORMAL HIGH (ref 98–107)
Co2: 24 mmol/L (ref 21–32)
EGFR (African American): >60
EGFR (Non-African Amer.): >60
GLUCOSE: 94 mg/dL (ref 65–99)
Osmolality: 284 (ref 275–301)
Potassium: 3.2 mmol/L — ABNORMAL LOW (ref 3.5–5.1)
SGOT(AST): 19 U/L (ref 15–37)
Sodium: 144 mmol/L (ref 136–145)
Total Protein: 6.3 g/dL — ABNORMAL LOW (ref 6.4–8.2)

## 2013-12-29 LAB — CK TOTAL AND CKMB (NOT AT ARMC)
CK, TOTAL: 25 U/L — AB (ref 26–192)
CK-MB: <0.5 ng/mL — ABNORMAL LOW (ref 0.5–3.6)

## 2013-12-29 LAB — CBC
HCT: 38.6 % (ref 35.0–47.0)
HGB: 12.8 g/dL (ref 12.0–16.0)
MCH: 30.2 pg (ref 26.0–34.0)
MCHC: 33.2 g/dL (ref 32.0–36.0)
MCV: 91 fL (ref 80–100)
Platelet: 220 10*3/uL (ref 150–440)
RBC: 4.25 10*6/uL (ref 3.80–5.20)
RDW: 17.9 % — ABNORMAL HIGH (ref 11.5–14.5)
WBC: 5 10*3/uL (ref 3.6–11.0)

## 2013-12-29 LAB — APTT: Activated PTT: 25.5 secs (ref 23.6–35.9)

## 2013-12-29 LAB — PRO B NATRIURETIC PEPTIDE: B-TYPE NATIURETIC PEPTID: 53 pg/mL (ref 0–125)

## 2013-12-29 LAB — PROTIME-INR
INR: 1
Prothrombin Time: 13.4 secs (ref 11.5–14.7)

## 2013-12-29 LAB — TROPONIN I

## 2013-12-31 DIAGNOSIS — I313 Pericardial effusion (noninflammatory): Secondary | ICD-10-CM

## 2013-12-31 LAB — VANCOMYCIN, TROUGH: Vancomycin, Trough: 12 ug/mL (ref 10–20)

## 2014-01-01 ENCOUNTER — Ambulatory Visit: Payer: Self-pay | Admitting: Oncology

## 2014-01-01 LAB — POTASSIUM: Potassium: 3.5 mmol/L (ref 3.5–5.1)

## 2014-01-02 LAB — CREATININE, SERUM
Creatinine: 0.79 mg/dL (ref 0.60–1.30)
EGFR (African American): >60
EGFR (Non-African Amer.): >60

## 2014-01-02 LAB — VANCOMYCIN, TROUGH: VANCOMYCIN, TROUGH: 19 ug/mL (ref 10–20)

## 2014-01-03 LAB — CREATININE, SERUM
Creatinine: 0.78 mg/dL (ref 0.60–1.30)
EGFR (African American): >60

## 2014-01-03 LAB — CULTURE, BLOOD (SINGLE)

## 2014-01-04 LAB — BASIC METABOLIC PANEL
Anion Gap: 7 (ref 7–16)
Anion Gap: 8 (ref 7–16)
BUN: 12 mg/dL (ref 7–18)
BUN: 19 mg/dL — ABNORMAL HIGH (ref 7–18)
CALCIUM: 5.2 mg/dL — AB (ref 8.5–10.1)
CHLORIDE: 100 mmol/L (ref 98–107)
CO2: 30 mmol/L (ref 21–32)
CREATININE: 0.44 mg/dL — AB (ref 0.60–1.30)
CREATININE: 0.99 mg/dL (ref 0.60–1.30)
Calcium, Total: 8.8 mg/dL (ref 8.5–10.1)
Chloride: 120 mmol/L — ABNORMAL HIGH (ref 98–107)
Co2: 24 mmol/L (ref 21–32)
EGFR (African American): >60
EGFR (African American): >60
EGFR (Non-African Amer.): >60
GLUCOSE: 51 mg/dL — AB (ref 65–99)
Glucose: 173 mg/dL — ABNORMAL HIGH (ref 65–99)
Osmolality: 297 (ref 275–301)
Osmolality: 282 (ref 275–301)
POTASSIUM: 4.2 mmol/L (ref 3.5–5.1)
Potassium: 2.1 mmol/L — CL (ref 3.5–5.1)
SODIUM: 151 mmol/L — AB (ref 136–145)
Sodium: 138 mmol/L (ref 136–145)

## 2014-01-05 LAB — BASIC METABOLIC PANEL
Anion Gap: 5 — ABNORMAL LOW (ref 7–16)
BUN: 16 mg/dL (ref 7–18)
CALCIUM: 8 mg/dL — AB (ref 8.5–10.1)
Chloride: 102 mmol/L (ref 98–107)
Co2: 33 mmol/L — ABNORMAL HIGH (ref 21–32)
Creatinine: 0.76 mg/dL (ref 0.60–1.30)
EGFR (Non-African Amer.): >60
GLUCOSE: 77 mg/dL (ref 65–99)
Osmolality: 279 (ref 275–301)
Potassium: 3.9 mmol/L (ref 3.5–5.1)
Sodium: 140 mmol/L (ref 136–145)

## 2014-01-10 LAB — BRONCHIAL WASH CULTURE

## 2014-01-16 ENCOUNTER — Inpatient Hospital Stay: Payer: Self-pay | Admitting: Internal Medicine

## 2014-01-16 LAB — COMPREHENSIVE METABOLIC PANEL
ALBUMIN: 2.7 g/dL — AB (ref 3.4–5.0)
ALT: 15 U/L
Alkaline Phosphatase: 67 U/L
Anion Gap: 9 (ref 7–16)
BILIRUBIN TOTAL: 0.4 mg/dL (ref 0.2–1.0)
BUN: 6 mg/dL — ABNORMAL LOW (ref 7–18)
CO2: 26 mmol/L (ref 21–32)
CREATININE: 0.62 mg/dL (ref 0.60–1.30)
Calcium, Total: 8.2 mg/dL — ABNORMAL LOW (ref 8.5–10.1)
Chloride: 107 mmol/L (ref 98–107)
EGFR (African American): >60
GLUCOSE: 99 mg/dL (ref 65–99)
OSMOLALITY: 281 (ref 275–301)
Potassium: 3.5 mmol/L (ref 3.5–5.1)
SGOT(AST): 21 U/L (ref 15–37)
SODIUM: 142 mmol/L (ref 136–145)
Total Protein: 5.7 g/dL — ABNORMAL LOW (ref 6.4–8.2)

## 2014-01-16 LAB — CBC
HCT: 37.5 % (ref 35.0–47.0)
HGB: 12.2 g/dL (ref 12.0–16.0)
MCH: 29.8 pg (ref 26.0–34.0)
MCHC: 32.4 g/dL (ref 32.0–36.0)
MCV: 92 fL (ref 80–100)
Platelet: 157 10*3/uL (ref 150–440)
RBC: 4.09 10*6/uL (ref 3.80–5.20)
RDW: 18 % — AB (ref 11.5–14.5)
WBC: 6.4 10*3/uL (ref 3.6–11.0)

## 2014-01-16 LAB — TROPONIN I: Troponin-I: <0.02 ng/mL

## 2014-01-16 LAB — PRO B NATRIURETIC PEPTIDE: B-TYPE NATIURETIC PEPTID: 226 pg/mL — AB (ref 0–125)

## 2014-01-18 LAB — CULTURE, FUNGUS WITHOUT SMEAR

## 2014-01-20 DIAGNOSIS — I313 Pericardial effusion (noninflammatory): Secondary | ICD-10-CM

## 2014-01-20 LAB — CREATININE, SERUM
CREATININE: 0.79 mg/dL (ref 0.60–1.30)
EGFR (Non-African Amer.): >60

## 2014-01-21 LAB — EXPECTORATED SPUTUM ASSESSMENT W GRAM STAIN, RFLX TO RESP C

## 2014-01-21 LAB — CULTURE, BLOOD (SINGLE)

## 2014-01-23 LAB — CBC WITH DIFFERENTIAL/PLATELET
Bands: 1 %
HCT: 36.1 % (ref 35.0–47.0)
HGB: 11.8 g/dL — ABNORMAL LOW (ref 12.0–16.0)
Lymphocytes: 4 %
MCH: 30.4 pg (ref 26.0–34.0)
MCHC: 32.7 g/dL (ref 32.0–36.0)
MCV: 93 fL (ref 80–100)
Metamyelocyte: 3 %
Monocytes: 5 %
PLATELETS: 245 10*3/uL (ref 150–440)
RBC: 3.89 10*6/uL (ref 3.80–5.20)
RDW: 18 % — ABNORMAL HIGH (ref 11.5–14.5)
Segmented Neutrophils: 87 %
WBC: 7.8 10*3/uL (ref 3.6–11.0)

## 2014-01-23 LAB — BASIC METABOLIC PANEL
Anion Gap: 8 (ref 7–16)
BUN: 25 mg/dL — ABNORMAL HIGH (ref 7–18)
CALCIUM: 8.3 mg/dL — AB (ref 8.5–10.1)
CO2: 31 mmol/L (ref 21–32)
Chloride: 102 mmol/L (ref 98–107)
Creatinine: 0.84 mg/dL (ref 0.60–1.30)
EGFR (Non-African Amer.): >60
Glucose: 143 mg/dL — ABNORMAL HIGH (ref 65–99)
OSMOLALITY: 288 (ref 275–301)
Potassium: 4.1 mmol/L (ref 3.5–5.1)
SODIUM: 141 mmol/L (ref 136–145)

## 2014-01-24 LAB — PROTIME-INR
INR: 1
PROTHROMBIN TIME: 12.8 s (ref 11.5–14.7)

## 2014-01-24 LAB — APTT

## 2014-01-26 LAB — CBC WITH DIFFERENTIAL/PLATELET
BANDS NEUTROPHIL: 3 %
HCT: 37.6 % (ref 35.0–47.0)
HGB: 12.2 g/dL (ref 12.0–16.0)
Lymphocytes: 3 %
MCH: 29.9 pg (ref 26.0–34.0)
MCHC: 32.6 g/dL (ref 32.0–36.0)
MCV: 92 fL (ref 80–100)
MONOS PCT: 5 %
Metamyelocyte: 2 %
Myelocyte: 1 %
Platelet: 213 10*3/uL (ref 150–440)
RBC: 4.09 10*6/uL (ref 3.80–5.20)
RDW: 17.9 % — ABNORMAL HIGH (ref 11.5–14.5)
SEGMENTED NEUTROPHILS: 86 %
WBC: 13.5 10*3/uL — AB (ref 3.6–11.0)

## 2014-01-26 LAB — BASIC METABOLIC PANEL
Anion Gap: 9 (ref 7–16)
BUN: 21 mg/dL — AB (ref 7–18)
CALCIUM: 7.3 mg/dL — AB (ref 8.5–10.1)
CREATININE: 0.84 mg/dL (ref 0.60–1.30)
Chloride: 102 mmol/L (ref 98–107)
Co2: 26 mmol/L (ref 21–32)
GLUCOSE: 202 mg/dL — AB (ref 65–99)
Osmolality: 283 (ref 275–301)
POTASSIUM: 4.2 mmol/L (ref 3.5–5.1)
SODIUM: 137 mmol/L (ref 136–145)

## 2014-01-29 LAB — MISC AER/ANAEROBIC CULT.

## 2014-01-31 ENCOUNTER — Inpatient Hospital Stay: Payer: Self-pay | Admitting: Internal Medicine

## 2014-01-31 LAB — URINALYSIS, COMPLETE
Bacteria: NONE SEEN
Bilirubin,UR: NEGATIVE
Blood: NEGATIVE
Glucose,UR: 150 mg/dL (ref 0–75)
KETONE: NEGATIVE
Leukocyte Esterase: NEGATIVE
Nitrite: NEGATIVE
PH: 7 (ref 4.5–8.0)
PROTEIN: NEGATIVE
RBC,UR: 1 /HPF (ref 0–5)
SPECIFIC GRAVITY: 1.021 (ref 1.003–1.030)
Squamous Epithelial: <1

## 2014-01-31 LAB — CBC
HCT: 37.6 % (ref 35.0–47.0)
HGB: 12.1 g/dL (ref 12.0–16.0)
MCH: 29.6 pg (ref 26.0–34.0)
MCHC: 32.1 g/dL (ref 32.0–36.0)
MCV: 92 fL (ref 80–100)
Platelet: 150 10*3/uL (ref 150–440)
RBC: 4.07 10*6/uL (ref 3.80–5.20)
RDW: 18.1 % — AB (ref 11.5–14.5)
WBC: 18.9 10*3/uL — ABNORMAL HIGH (ref 3.6–11.0)

## 2014-01-31 LAB — COMPREHENSIVE METABOLIC PANEL
ANION GAP: 3 — AB (ref 7–16)
AST: 28 U/L (ref 15–37)
Albumin: 2.6 g/dL — ABNORMAL LOW (ref 3.4–5.0)
Alkaline Phosphatase: 57 U/L (ref 46–116)
BUN: 27 mg/dL — ABNORMAL HIGH (ref 7–18)
Bilirubin,Total: 0.4 mg/dL (ref 0.2–1.0)
CHLORIDE: 102 mmol/L (ref 98–107)
Calcium, Total: 8.3 mg/dL — ABNORMAL LOW (ref 8.5–10.1)
Co2: 34 mmol/L — ABNORMAL HIGH (ref 21–32)
Creatinine: 0.66 mg/dL (ref 0.60–1.30)
EGFR (African American): >60
EGFR (Non-African Amer.): >60
Glucose: 147 mg/dL — ABNORMAL HIGH (ref 65–99)
Osmolality: 285 (ref 275–301)
POTASSIUM: 4.7 mmol/L (ref 3.5–5.1)
SGPT (ALT): 38 U/L (ref 14–63)
Sodium: 139 mmol/L (ref 136–145)
TOTAL PROTEIN: 5.3 g/dL — AB (ref 6.4–8.2)

## 2014-01-31 LAB — TROPONIN I: Troponin-I: <0.02 ng/mL

## 2014-02-01 ENCOUNTER — Ambulatory Visit: Payer: Self-pay | Admitting: Oncology

## 2014-02-01 LAB — CBC WITH DIFFERENTIAL/PLATELET
BASOS PCT: 0.1 %
Basophil #: 0 10*3/uL (ref 0.0–0.1)
EOS PCT: 0 %
Eosinophil #: 0 10*3/uL (ref 0.0–0.7)
HCT: 35.9 % (ref 35.0–47.0)
HGB: 11.5 g/dL — AB (ref 12.0–16.0)
Lymphocyte #: 0.3 10*3/uL — ABNORMAL LOW (ref 1.0–3.6)
Lymphocyte %: 2.5 %
MCH: 29.7 pg (ref 26.0–34.0)
MCHC: 31.9 g/dL — ABNORMAL LOW (ref 32.0–36.0)
MCV: 93 fL (ref 80–100)
MONO ABS: 0.6 x10 3/mm (ref 0.2–0.9)
MONOS PCT: 4.9 %
NEUTROS PCT: 92.5 %
Neutrophil #: 11.8 10*3/uL — ABNORMAL HIGH (ref 1.4–6.5)
PLATELETS: 138 10*3/uL — AB (ref 150–440)
RBC: 3.86 10*6/uL (ref 3.80–5.20)
RDW: 18.2 % — ABNORMAL HIGH (ref 11.5–14.5)
WBC: 12.7 10*3/uL — AB (ref 3.6–11.0)

## 2014-02-01 LAB — BASIC METABOLIC PANEL
Anion Gap: 6 — ABNORMAL LOW (ref 7–16)
BUN: 24 mg/dL — ABNORMAL HIGH (ref 7–18)
CALCIUM: 7.8 mg/dL — AB (ref 8.5–10.1)
CHLORIDE: 102 mmol/L (ref 98–107)
Co2: 31 mmol/L (ref 21–32)
Creatinine: 0.61 mg/dL (ref 0.60–1.30)
EGFR (African American): >60
EGFR (Non-African Amer.): >60
Glucose: 165 mg/dL — ABNORMAL HIGH (ref 65–99)
OSMOLALITY: 285 (ref 275–301)
POTASSIUM: 4.3 mmol/L (ref 3.5–5.1)
Sodium: 139 mmol/L (ref 136–145)

## 2014-02-01 LAB — MAGNESIUM: Magnesium: 2.3 mg/dL

## 2014-02-02 ENCOUNTER — Ambulatory Visit: Payer: Self-pay | Admitting: Oncology

## 2014-02-04 LAB — COMPREHENSIVE METABOLIC PANEL
ALBUMIN: 2.8 g/dL — AB (ref 3.4–5.0)
ALT: 57 U/L (ref 14–63)
AST: 16 U/L (ref 15–37)
Alkaline Phosphatase: 71 U/L (ref 46–116)
Anion Gap: 9 (ref 7–16)
BUN: 19 mg/dL — AB (ref 7–18)
Bilirubin,Total: 0.4 mg/dL (ref 0.2–1.0)
CHLORIDE: 103 mmol/L (ref 98–107)
Calcium, Total: 8 mg/dL — ABNORMAL LOW (ref 8.5–10.1)
Co2: 29 mmol/L (ref 21–32)
Creatinine: 0.77 mg/dL (ref 0.60–1.30)
EGFR (Non-African Amer.): >60
GLUCOSE: 229 mg/dL — AB (ref 65–99)
Osmolality: 291 (ref 275–301)
Potassium: 4.3 mmol/L (ref 3.5–5.1)
Sodium: 141 mmol/L (ref 136–145)
Total Protein: 5.3 g/dL — ABNORMAL LOW (ref 6.4–8.2)

## 2014-02-04 LAB — CBC CANCER CENTER
Basophil #: 0.1 x10 3/mm (ref 0.0–0.1)
Basophil %: 0.5 %
Eosinophil #: 0 x10 3/mm (ref 0.0–0.7)
Eosinophil %: 0 %
HCT: 36.8 % (ref 35.0–47.0)
HGB: 11.8 g/dL — ABNORMAL LOW (ref 12.0–16.0)
LYMPHS ABS: 0.3 x10 3/mm — AB (ref 1.0–3.6)
LYMPHS PCT: 2.6 %
MCH: 29.9 pg (ref 26.0–34.0)
MCHC: 32.2 g/dL (ref 32.0–36.0)
MCV: 93 fL (ref 80–100)
Monocyte #: 0.4 x10 3/mm (ref 0.2–0.9)
Monocyte %: 3.6 %
Neutrophil #: 10.9 x10 3/mm — ABNORMAL HIGH (ref 1.4–6.5)
Neutrophil %: 93.3 %
Platelet: 136 x10 3/mm — ABNORMAL LOW (ref 150–440)
RBC: 3.96 10*6/uL (ref 3.80–5.20)
RDW: 19.1 % — AB (ref 11.5–14.5)
WBC: 11.7 x10 3/mm — ABNORMAL HIGH (ref 3.6–11.0)

## 2014-02-05 LAB — CULTURE, BLOOD (SINGLE)

## 2014-02-08 LAB — CULTURE, FUNGUS WITHOUT SMEAR

## 2014-03-02 ENCOUNTER — Ambulatory Visit
Admit: 2014-03-02 | Disposition: A | Discharge status: Home / Self Care | Payer: Self-pay | Attending: Oncology | Admitting: Oncology

## 2014-03-03 ENCOUNTER — Observation Stay: Payer: Self-pay | Admitting: Internal Medicine

## 2014-03-06 ENCOUNTER — Inpatient Hospital Stay: Payer: Self-pay | Admitting: Internal Medicine

## 2014-03-25 LAB — COMPREHENSIVE METABOLIC PANEL WITH GFR
Albumin: 3.2 g/dL — ABNORMAL LOW
Alkaline Phosphatase: 51 U/L
Anion Gap: 6 — ABNORMAL LOW
BUN: 15 mg/dL
Bilirubin,Total: 0.5 mg/dL
Calcium, Total: 8.7 mg/dL — ABNORMAL LOW
Chloride: 102 mmol/L
Co2: 30 mmol/L
Creatinine: 0.74 mg/dL
EGFR (African American): >60
EGFR (Non-African Amer.): >60
Glucose: 148 mg/dL — ABNORMAL HIGH
Potassium: 4.1 mmol/L
SGOT(AST): 20 U/L
SGPT (ALT): 21 U/L
Sodium: 138 mmol/L
Total Protein: 5.8 g/dL — ABNORMAL LOW

## 2014-03-25 LAB — CBC CANCER CENTER
BASOS ABS: 0.1 x10 3/mm (ref 0.0–0.1)
Basophil %: 1.1 %
EOS ABS: 0.2 x10 3/mm (ref 0.0–0.7)
Eosinophil %: 2.9 %
HCT: 31.6 % — ABNORMAL LOW (ref 35.0–47.0)
HGB: 10.5 g/dL — ABNORMAL LOW (ref 12.0–16.0)
LYMPHS ABS: 0.9 x10 3/mm — AB (ref 1.0–3.6)
LYMPHS PCT: 12.4 %
MCH: 31.3 pg (ref 26.0–34.0)
MCHC: 33.3 g/dL (ref 32.0–36.0)
MCV: 94 fL (ref 80–100)
MONOS PCT: 7.2 %
Monocyte #: 0.5 x10 3/mm (ref 0.2–0.9)
Neutrophil #: 5.5 x10 3/mm (ref 1.4–6.5)
Neutrophil %: 76.4 %
Platelet: 167 x10 3/mm (ref 150–440)
RBC: 3.37 10*6/uL — ABNORMAL LOW (ref 3.80–5.20)
RDW: 19.3 % — AB (ref 11.5–14.5)
WBC: 7.2 x10 3/mm (ref 3.6–11.0)

## 2014-03-25 LAB — MAGNESIUM: Magnesium: 1.8 mg/dL

## 2014-04-01 ENCOUNTER — Inpatient Hospital Stay
Admit: 2014-04-01 | Disposition: A | Discharge status: Home / Self Care | Payer: Self-pay | Attending: Internal Medicine | Admitting: Internal Medicine

## 2014-04-01 LAB — URINALYSIS, COMPLETE
BACTERIA: NONE SEEN
BILIRUBIN, UR: NEGATIVE
Blood: NEGATIVE
GLUCOSE, UR: NEGATIVE mg/dL (ref 0–75)
Hyaline Cast: 2
LEUKOCYTE ESTERASE: NEGATIVE
Nitrite: NEGATIVE
Ph: 5 (ref 4.5–8.0)
Protein: NEGATIVE
Specific Gravity: 1.016 (ref 1.003–1.030)
Squamous Epithelial: 1
WBC UR: 1 /HPF (ref 0–5)

## 2014-04-01 LAB — CBC
HCT: 33 % — ABNORMAL LOW (ref 35.0–47.0)
HGB: 10.7 g/dL — ABNORMAL LOW (ref 12.0–16.0)
MCH: 30.6 pg (ref 26.0–34.0)
MCHC: 32.3 g/dL (ref 32.0–36.0)
MCV: 95 fL (ref 80–100)
Platelet: 208 10*3/uL (ref 150–440)
RBC: 3.49 10*6/uL — AB (ref 3.80–5.20)
RDW: 19.7 % — AB (ref 11.5–14.5)
WBC: 4.1 10*3/uL (ref 3.6–11.0)

## 2014-04-01 LAB — BASIC METABOLIC PANEL
Anion Gap: 13 (ref 7–16)
BUN: 8 mg/dL
CHLORIDE: 99 mmol/L — AB
Calcium, Total: 8.2 mg/dL — ABNORMAL LOW
Co2: 24 mmol/L
Creatinine: 0.79 mg/dL
EGFR (African American): >60
EGFR (Non-African Amer.): >60
Glucose: 81 mg/dL
Potassium: 2.8 mmol/L — ABNORMAL LOW
Sodium: 136 mmol/L

## 2014-04-01 LAB — TROPONIN I

## 2014-04-01 LAB — PRO B NATRIURETIC PEPTIDE: B-Type Natriuretic Peptide: 44 pg/mL

## 2014-04-02 ENCOUNTER — Ambulatory Visit
Admit: 2014-04-02 | Disposition: A | Discharge status: Home / Self Care | Payer: Self-pay | Attending: Oncology | Admitting: Oncology

## 2014-04-02 LAB — COMPREHENSIVE METABOLIC PANEL
ALBUMIN: 2.5 g/dL — AB
ALK PHOS: 43 U/L
ALT: 11 U/L — AB
ANION GAP: 13 (ref 7–16)
BILIRUBIN TOTAL: 0.8 mg/dL
CHLORIDE: 107 mmol/L
CO2: 21 mmol/L — AB
CREATININE: 0.56 mg/dL
Calcium, Total: 7.9 mg/dL — ABNORMAL LOW
Glucose: 65 mg/dL
POTASSIUM: 3.6 mmol/L
SGOT(AST): 16 U/L
Sodium: 141 mmol/L
TOTAL PROTEIN: 4.8 g/dL — AB

## 2014-04-02 LAB — CBC WITH DIFFERENTIAL/PLATELET
BASOS PCT: 0.7 %
Basophil #: 0 10*3/uL (ref 0.0–0.1)
EOS PCT: 5.7 %
Eosinophil #: 0.2 10*3/uL (ref 0.0–0.7)
HCT: 31.5 % — AB (ref 35.0–47.0)
HGB: 10 g/dL — AB (ref 12.0–16.0)
LYMPHS ABS: 0.5 10*3/uL — AB (ref 1.0–3.6)
Lymphocyte %: 20.1 %
MCH: 30.1 pg (ref 26.0–34.0)
MCHC: 31.7 g/dL — AB (ref 32.0–36.0)
MCV: 95 fL (ref 80–100)
MONO ABS: 0.3 x10 3/mm (ref 0.2–0.9)
Monocyte %: 13.2 %
NEUTROS ABS: 1.6 10*3/uL (ref 1.4–6.5)
Neutrophil %: 60.3 %
Platelet: 214 10*3/uL (ref 150–440)
RBC: 3.32 10*6/uL — ABNORMAL LOW (ref 3.80–5.20)
RDW: 19.6 % — AB (ref 11.5–14.5)
WBC: 2.6 10*3/uL — ABNORMAL LOW (ref 3.6–11.0)

## 2014-04-02 LAB — MAGNESIUM: Magnesium: 1.5 mg/dL — ABNORMAL LOW

## 2014-04-03 LAB — CBC WITH DIFFERENTIAL/PLATELET
BASOS ABS: 0 10*3/uL (ref 0.0–0.1)
BASOS PCT: 0.6 %
EOS ABS: 0.2 10*3/uL (ref 0.0–0.7)
Eosinophil %: 7 %
HCT: 36.4 % (ref 35.0–47.0)
HGB: 12.2 g/dL (ref 12.0–16.0)
LYMPHS ABS: 0.6 10*3/uL — AB (ref 1.0–3.6)
Lymphocyte %: 17 %
MCH: 31.2 pg (ref 26.0–34.0)
MCHC: 33.6 g/dL (ref 32.0–36.0)
MCV: 93 fL (ref 80–100)
Monocyte #: 0.4 x10 3/mm (ref 0.2–0.9)
Monocyte %: 12.8 %
Neutrophil #: 2.2 10*3/uL (ref 1.4–6.5)
Neutrophil %: 62.6 %
Platelet: 245 10*3/uL (ref 150–440)
RBC: 3.92 10*6/uL (ref 3.80–5.20)
RDW: 19.5 % — AB (ref 11.5–14.5)
WBC: 3.4 10*3/uL — AB (ref 3.6–11.0)

## 2014-04-03 LAB — CREATININE, SERUM: Creatine, Serum: 0.68

## 2014-04-03 LAB — BASIC METABOLIC PANEL
Anion Gap: 7 (ref 7–16)
BUN: <5 mg/dL
CALCIUM: 8.3 mg/dL — AB
CHLORIDE: 102 mmol/L
Co2: 29 mmol/L
Creatinine: 0.68 mg/dL
EGFR (African American): >60
EGFR (Non-African Amer.): >60
GLUCOSE: 138 mg/dL — AB
Potassium: 3 mmol/L — ABNORMAL LOW
Sodium: 138 mmol/L

## 2014-04-03 LAB — MAGNESIUM: Magnesium: 1.7 mg/dL

## 2014-04-07 ENCOUNTER — Inpatient Hospital Stay
Admit: 2014-04-07 | Disposition: A | Discharge status: Home / Self Care | Payer: Self-pay | Attending: Internal Medicine | Admitting: Internal Medicine

## 2014-04-07 LAB — URINALYSIS, COMPLETE
BLOOD: NEGATIVE
Bacteria: NONE SEEN
Bilirubin,UR: NEGATIVE
Glucose,UR: NEGATIVE mg/dL (ref 0–75)
Leukocyte Esterase: NEGATIVE
NITRITE: NEGATIVE
Ph: 6 (ref 4.5–8.0)
Protein: 30
RBC,UR: 1 /HPF (ref 0–5)
SPECIFIC GRAVITY: 1.016 (ref 1.003–1.030)
Squamous Epithelial: 3

## 2014-04-07 LAB — CBC
HCT: 34.5 % — ABNORMAL LOW (ref 35.0–47.0)
HGB: 11.2 g/dL — AB (ref 12.0–16.0)
MCH: 29.7 pg (ref 26.0–34.0)
MCHC: 32.5 g/dL (ref 32.0–36.0)
MCV: 91 fL (ref 80–100)
Platelet: 270 10*3/uL (ref 150–440)
RBC: 3.78 10*6/uL — ABNORMAL LOW (ref 3.80–5.20)
RDW: 18.1 % — ABNORMAL HIGH (ref 11.5–14.5)
WBC: 3.4 10*3/uL — ABNORMAL LOW (ref 3.6–11.0)

## 2014-04-07 LAB — COMPREHENSIVE METABOLIC PANEL
ALBUMIN: 2.9 g/dL — AB
ANION GAP: 16 (ref 7–16)
Alkaline Phosphatase: 44 U/L
BUN: 6 mg/dL
Bilirubin,Total: 0.3 mg/dL
CALCIUM: 8 mg/dL — AB
Chloride: 95 mmol/L — ABNORMAL LOW
Co2: 27 mmol/L
Creatinine: 0.66 mg/dL
EGFR (Non-African Amer.): >60
GLUCOSE: 94 mg/dL
POTASSIUM: 2.1 mmol/L — AB
SGOT(AST): 19 U/L
SGPT (ALT): 11 U/L — ABNORMAL LOW
SODIUM: 138 mmol/L
Total Protein: 5.4 g/dL — ABNORMAL LOW

## 2014-04-07 LAB — MAGNESIUM: MAGNESIUM: 1.3 mg/dL — AB

## 2014-04-08 LAB — CBC WITH DIFFERENTIAL/PLATELET
BASOS PCT: 0.5 %
Basophil #: 0 10*3/uL (ref 0.0–0.1)
Eosinophil #: 0 10*3/uL (ref 0.0–0.7)
Eosinophil %: 0 %
HCT: 33.7 % — ABNORMAL LOW (ref 35.0–47.0)
HGB: 11.1 g/dL — ABNORMAL LOW (ref 12.0–16.0)
Lymphocyte #: 0.3 10*3/uL — ABNORMAL LOW (ref 1.0–3.6)
Lymphocyte %: 13.6 %
MCH: 29.9 pg (ref 26.0–34.0)
MCHC: 33 g/dL (ref 32.0–36.0)
MCV: 91 fL (ref 80–100)
MONO ABS: 0.2 x10 3/mm (ref 0.2–0.9)
Monocyte %: 9.9 %
Neutrophil #: 1.7 10*3/uL (ref 1.4–6.5)
Neutrophil %: 76 %
PLATELETS: 293 10*3/uL (ref 150–440)
RBC: 3.72 10*6/uL — AB (ref 3.80–5.20)
RDW: 17.8 % — ABNORMAL HIGH (ref 11.5–14.5)
WBC: 2.2 10*3/uL — ABNORMAL LOW (ref 3.6–11.0)

## 2014-04-08 LAB — COMPREHENSIVE METABOLIC PANEL
ALK PHOS: 46 U/L
ALT: 10 U/L — AB
Albumin: 2.9 g/dL — ABNORMAL LOW
Anion Gap: 8 (ref 7–16)
BILIRUBIN TOTAL: 0.2 mg/dL — AB
BUN: 6 mg/dL
CREATININE: 0.52 mg/dL
Calcium, Total: 7.9 mg/dL — ABNORMAL LOW
Chloride: 101 mmol/L
Co2: 28 mmol/L
EGFR (African American): >60
EGFR (Non-African Amer.): >60
GLUCOSE: 163 mg/dL — AB
Potassium: 3.3 mmol/L — ABNORMAL LOW
SGOT(AST): 19 U/L
SODIUM: 137 mmol/L
TOTAL PROTEIN: 5.6 g/dL — AB

## 2014-04-08 LAB — MAGNESIUM: Magnesium: 2.2 mg/dL

## 2014-04-11 ENCOUNTER — Inpatient Hospital Stay
Admit: 2014-04-11 | Disposition: A | Discharge status: Home / Self Care | Payer: Self-pay | Attending: Internal Medicine | Admitting: Internal Medicine

## 2014-04-11 LAB — URINALYSIS, COMPLETE
BILIRUBIN, UR: NEGATIVE
Bacteria: NONE SEEN
Blood: NEGATIVE
Glucose,UR: NEGATIVE mg/dL (ref 0–75)
KETONE: NEGATIVE
LEUKOCYTE ESTERASE: NEGATIVE
Nitrite: NEGATIVE
PH: 8 (ref 4.5–8.0)
PROTEIN: NEGATIVE
RBC, UR: NONE SEEN /HPF (ref 0–5)
SPECIFIC GRAVITY: 1.004 (ref 1.003–1.030)
Squamous Epithelial: NONE SEEN

## 2014-04-11 LAB — COMPREHENSIVE METABOLIC PANEL
ALK PHOS: 40 U/L
ANION GAP: 10 (ref 7–16)
Albumin: 3.2 g/dL — ABNORMAL LOW
BUN: 12 mg/dL
Bilirubin,Total: 0.5 mg/dL
CALCIUM: 8.3 mg/dL — AB
CO2: 24 mmol/L
Chloride: 105 mmol/L
Creatinine: 0.62 mg/dL
EGFR (Non-African Amer.): >60
Glucose: 75 mg/dL
POTASSIUM: 3.1 mmol/L — AB
SGOT(AST): 38 U/L
SGPT (ALT): 28 U/L
Sodium: 139 mmol/L
Total Protein: 5.5 g/dL — ABNORMAL LOW

## 2014-04-11 LAB — CBC
HCT: 40.3 % (ref 35.0–47.0)
HGB: 12.9 g/dL (ref 12.0–16.0)
MCH: 29.8 pg (ref 26.0–34.0)
MCHC: 32 g/dL (ref 32.0–36.0)
MCV: 93 fL (ref 80–100)
Platelet: 336 10*3/uL (ref 150–440)
RBC: 4.32 10*6/uL (ref 3.80–5.20)
RDW: 17.9 % — ABNORMAL HIGH (ref 11.5–14.5)
WBC: 10.4 10*3/uL (ref 3.6–11.0)

## 2014-04-11 LAB — TROPONIN I
Troponin-I: <0.03 ng/mL
Troponin-I: <0.03 ng/mL

## 2014-04-11 LAB — MAGNESIUM: Magnesium: 1.7 mg/dL

## 2014-04-11 LAB — TSH: THYROID STIMULATING HORM: 3.157 u[IU]/mL

## 2014-04-11 LAB — LIPASE, BLOOD: Lipase: 58 U/L — ABNORMAL HIGH

## 2014-04-12 LAB — CBC WITH DIFFERENTIAL/PLATELET
Basophil #: 0 10*3/uL (ref 0.0–0.1)
Basophil %: 0.9 %
Eosinophil #: 0.2 10*3/uL (ref 0.0–0.7)
Eosinophil %: 3.7 %
HCT: 34.7 % — AB (ref 35.0–47.0)
HGB: 11.4 g/dL — ABNORMAL LOW (ref 12.0–16.0)
LYMPHS PCT: 24.1 %
Lymphocyte #: 1.1 10*3/uL (ref 1.0–3.6)
MCH: 30 pg (ref 26.0–34.0)
MCHC: 32.9 g/dL (ref 32.0–36.0)
MCV: 91 fL (ref 80–100)
Monocyte #: 0.6 x10 3/mm (ref 0.2–0.9)
Monocyte %: 13.3 %
NEUTROS PCT: 58 %
Neutrophil #: 2.5 10*3/uL (ref 1.4–6.5)
PLATELETS: 283 10*3/uL (ref 150–440)
RBC: 3.81 10*6/uL (ref 3.80–5.20)
RDW: 17.9 % — ABNORMAL HIGH (ref 11.5–14.5)
WBC: 4.4 10*3/uL (ref 3.6–11.0)

## 2014-04-12 LAB — BASIC METABOLIC PANEL
Anion Gap: 6 — ABNORMAL LOW (ref 7–16)
BUN: 9 mg/dL
CHLORIDE: 109 mmol/L
CREATININE: 0.62 mg/dL
Calcium, Total: 8 mg/dL — ABNORMAL LOW
Co2: 27 mmol/L
Glucose: 81 mg/dL
POTASSIUM: 3.4 mmol/L — AB
Sodium: 142 mmol/L

## 2014-04-12 LAB — TROPONIN I

## 2014-04-13 LAB — COMPREHENSIVE METABOLIC PANEL
ALT: 26 U/L
AST: 24 U/L
Albumin: 2.8 g/dL — ABNORMAL LOW
Alkaline Phosphatase: 39 U/L
Anion Gap: 4 — ABNORMAL LOW (ref 7–16)
BUN: 8 mg/dL
Bilirubin,Total: 0.4 mg/dL
CO2: 26 mmol/L
Calcium, Total: 7.4 mg/dL — ABNORMAL LOW
Chloride: 108 mmol/L
Creatinine: 0.67 mg/dL
EGFR (African American): >60
Glucose: 86 mg/dL
Potassium: 3.5 mmol/L
Sodium: 138 mmol/L
Total Protein: 4.7 g/dL — ABNORMAL LOW

## 2014-04-13 LAB — MAGNESIUM: Magnesium: 1.3 mg/dL — ABNORMAL LOW

## 2014-04-14 LAB — URINE CULTURE

## 2014-04-14 LAB — MAGNESIUM: Magnesium: 2 mg/dL

## 2014-04-15 LAB — BASIC METABOLIC PANEL
Anion Gap: 5 — ABNORMAL LOW (ref 7–16)
BUN: 5 mg/dL — ABNORMAL LOW
CO2: 29 mmol/L
CREATININE: 0.57 mg/dL
Calcium, Total: 7.9 mg/dL — ABNORMAL LOW
Chloride: 105 mmol/L
Glucose: 80 mg/dL
Potassium: 4 mmol/L
Sodium: 139 mmol/L

## 2014-04-15 LAB — CBC WITH DIFFERENTIAL/PLATELET
Basophil #: 0 10*3/uL (ref 0.0–0.1)
Basophil %: 1.1 %
EOS ABS: 0.1 10*3/uL (ref 0.0–0.7)
EOS PCT: 1.8 %
HCT: 34.3 % — AB (ref 35.0–47.0)
HGB: 10.9 g/dL — AB (ref 12.0–16.0)
LYMPHS PCT: 18.6 %
Lymphocyte #: 0.8 10*3/uL — ABNORMAL LOW (ref 1.0–3.6)
MCH: 29 pg (ref 26.0–34.0)
MCHC: 31.9 g/dL — AB (ref 32.0–36.0)
MCV: 91 fL (ref 80–100)
Monocyte #: 0.5 x10 3/mm (ref 0.2–0.9)
Monocyte %: 12.1 %
NEUTROS PCT: 66.4 %
Neutrophil #: 2.7 10*3/uL (ref 1.4–6.5)
Platelet: 207 10*3/uL (ref 150–440)
RBC: 3.77 10*6/uL — ABNORMAL LOW (ref 3.80–5.20)
RDW: 17.9 % — ABNORMAL HIGH (ref 11.5–14.5)
WBC: 4.1 10*3/uL (ref 3.6–11.0)

## 2014-04-17 ENCOUNTER — Emergency Department
Admit: 2014-04-17 | Disposition: A | Discharge status: Home / Self Care | Payer: Self-pay | Admitting: Emergency Medicine

## 2014-04-18 ENCOUNTER — Emergency Department
Admit: 2014-04-18 | Disposition: A | Discharge status: Home / Self Care | Payer: Self-pay | Admitting: Emergency Medicine

## 2014-04-19 LAB — URINALYSIS, COMPLETE
BACTERIA: NONE SEEN
Bilirubin,UR: NEGATIVE
Blood: NEGATIVE
Glucose,UR: NEGATIVE mg/dL (ref 0–75)
KETONE: NEGATIVE
LEUKOCYTE ESTERASE: NEGATIVE
NITRITE: NEGATIVE
Ph: 7 (ref 4.5–8.0)
Protein: NEGATIVE
SPECIFIC GRAVITY: 1.016 (ref 1.003–1.030)

## 2014-04-19 LAB — STOOL CULTURE

## 2014-04-20 ENCOUNTER — Emergency Department
Admit: 2014-04-20 | Disposition: A | Discharge status: Home / Self Care | Payer: Self-pay | Admitting: Emergency Medicine

## 2014-04-20 LAB — COMPREHENSIVE METABOLIC PANEL
AST: 25 U/L
Albumin: 3.7 g/dL
Alkaline Phosphatase: 50 U/L
Anion Gap: 11 (ref 7–16)
BUN: 22 mg/dL — ABNORMAL HIGH
Bilirubin,Total: 0.3 mg/dL
CHLORIDE: 96 mmol/L — AB
Calcium, Total: 8.7 mg/dL — ABNORMAL LOW
Co2: 32 mmol/L
Creatinine: 0.8 mg/dL
GLUCOSE: 149 mg/dL — AB
Potassium: 2.9 mmol/L — ABNORMAL LOW
SGPT (ALT): 19 U/L
SODIUM: 139 mmol/L
Total Protein: 6 g/dL — ABNORMAL LOW

## 2014-04-20 LAB — URINALYSIS, COMPLETE
BILIRUBIN, UR: NEGATIVE
Glucose,UR: NEGATIVE mg/dL (ref 0–75)
Ketone: NEGATIVE
LEUKOCYTE ESTERASE: NEGATIVE
NITRITE: NEGATIVE
Ph: 6 (ref 4.5–8.0)
Protein: NEGATIVE
Specific Gravity: 1.019 (ref 1.003–1.030)

## 2014-04-20 LAB — CBC WITH DIFFERENTIAL/PLATELET
Basophil #: 0 10*3/uL (ref 0.0–0.1)
Basophil %: 0.2 %
EOS PCT: 0 %
Eosinophil #: 0 10*3/uL (ref 0.0–0.7)
HCT: 34.8 % — ABNORMAL LOW (ref 35.0–47.0)
HGB: 11.2 g/dL — ABNORMAL LOW (ref 12.0–16.0)
LYMPHS ABS: 0.4 10*3/uL — AB (ref 1.0–3.6)
Lymphocyte %: 4.9 %
MCH: 28.9 pg (ref 26.0–34.0)
MCHC: 32.2 g/dL (ref 32.0–36.0)
MCV: 90 fL (ref 80–100)
Monocyte #: 0.4 x10 3/mm (ref 0.2–0.9)
Monocyte %: 3.9 %
Neutrophil #: 8.3 10*3/uL — ABNORMAL HIGH (ref 1.4–6.5)
Neutrophil %: 91 %
Platelet: 209 10*3/uL (ref 150–440)
RBC: 3.88 10*6/uL (ref 3.80–5.20)
RDW: 17.7 % — ABNORMAL HIGH (ref 11.5–14.5)
WBC: 9.2 10*3/uL (ref 3.6–11.0)

## 2014-04-24 NOTE — H&P (Signed)
PATIENT NAME:  [name], [name] MR#:  [id number] DATE OF BIRTH:  [birth year]  DATE OF ADMISSION:  08/20/2013  PRIMARY CARE PHYSICIAN: Nonlocal.  REFERRING PHYSICIAN: Dr. Corky Downs  PRIMARY ONCOLOGIST: Dr. Grayland Ormond.  CHIEF COMPLAINT: Dizziness and nausea.   HISTORY OF PRESENT ILLNESS: The patient is 54 year old female with a history of metastatic lung cancer with mets to bone is presenting to the ED with the chief complaint of dizziness and intractable nausea. She denies any vomiting, denies any syncope. She gets both chemotherapy and radiation therapy. The patient also reported that she is completed with her radiation therapy and now currently on maintenance chemotherapy. Last chemotherapy was on July 28th and follows with Dr. Grayland Ormond as an outpatient. She denies any other complaints. No fever. The patient tried antinausea medications at home with no significant improvement.   In the ED, the patient was given antinausea medication and IV fluids, and hospitalist team is called to admit the patient.   PAST MEDICAL HISTORY: Metastatic lung cancer with liver mets.  PAST SURGICAL HISTORY: Lung biopsy.   ALLERGIES: No known drug allergies.   PSYCHOSOCIAL HISTORY: Lives at home, lives alone. Denies any history of smoking, alcohol or illicit drug usage. She used to smoke, but quit smoking. No history of alcohol or illicit drug usage.   FAMILY HISTORY: Mom has history of lung and breast cancer. Dad has history of prostate cancer.   HOME MEDICATIONS: Zofran 8 mg 1 tablet p.o. every 8 hours, vitamin B12 at 500 mcg 1 tablet p.o. once daily, omeprazole 20 mg 1 capsule p.o. once daily, multivitamin 1 tablet p.o. once daily, lidocaine 5% topical film apply 2 patches topically once daily, hydromorphone 2 mg 1 to 2 tablets p.o. every 4 hours as needed, granisetron 1 tablet p.o. every 12 hours, Gabapentin 300 mg p.o. 2 times a day, Fentanyl 75 mcg patch transdermally every 72 hours, Dulcolax 1 capsule p.o. 2 times a  day, dronabinol 5 mg 1 capsule p.o. 2 times a day, Compazine 10 mg 1 tablet p.o. every 6 hours, biotin 1,000 mcg p.o. once daily.  REVIEW OF SYSTEMS:  CONSTITUTIONAL: Denies any fever, fatigue. Complaining of weakness.  EYES: Denies blurry vision, double vision, glaucoma.  ENT: Denies epistaxis, discharge or snoring.  RESPIRATORY:  Denies any cough or COPD.  CARDIOVASCULAR: Complaining of chest pain from underlying lung cancer. Denies any palpitations.  GASTROINTESTINAL: Complaining of nausea. Denies vomiting, diarrhea. Denies any abdominal pain. No hematemesis, melena.  GENITOURINARY: No dysuria, hematuria, renal calculus. ENDOCRINE: Denies polyuria, nocturia, thyroid problems.  HEMATOLOGIC AND LYMPHATIC: No anemia, easy bruising, or bleeding.  Metastatic lung cancer with bone mets.  INTEGUMENT: Denies rash, lesions.  MUSCULOSKELETAL: No joint pain in the neck and back. Denies any gout. NEUROLOGIC: Denies vertigo, ataxia, dementia.  PSYCHIATRIC: No ADD, OCD.   PHYSICAL EXAMINATION: VITAL SIGNS: Temperature 97.6, pulse 58, respirations 16 to 18, blood pressure 146/82, pulse ox 98%.  GENERAL APPEARANCE: Not in acute distress. Moderately built and nourished. HEENT: Normocephalic, atraumatic. Pupils are equal and reactive to light and accommodation. No scleral icterus. No conjunctival injection. No sinus tenderness. No postnasal drip. NECK: Supple. No JVD. No thyromegaly. Range of motion is intact.  LUNGS: Positive rales and rhonchi.  CARDIAC: S1, S2 normal. Regular rate and rhythm. No murmur.  ABDOMEN: Soft. Bowel sounds are positive in all 4 quadrants. Nontender, nondistended. No hepatosplenomegaly. No masses felt. NEUROLOGIC: Awake, alert and oriented x3. Cranial nerves II through XII are grossly intact. Motor and sensory are intact. Reflexes are  2+.  EXTREMITIES: No edema. There cyanosis. No clubbing.  SKIN: Warm to touch. Normal turgor. No rashes. No lesions.  MUSCULOSKELETAL: No joint  effusion or tenderness.  PSYCHIATRIC: Normal mood and affect.  DIAGNOSTIC DATA: LFTs: Total protein 5.8, albumin 2.9, alkaline phosphatase 44, ALT 11, glucose 91, BUN 6, creatinine 0.63, sodium 140, potassium 3.7. CO2 and anion gap are normal. Calcium 8.3. Magnesium 1.7. Phosphorus 2.9. WBC 4.7, hemoglobin 11.3, hematocrit 34.5, platelets 170,000.  Chest x-ray, PA and lateral views: No segmental infiltrate or pulmonary edema. Stable reticular nodular interstitial opacities in the right upper lobe and right peripheral region. Stable right IJ central line in position.  CAT scan of the head without contrast has revealed no acute intracranial hemorrhage, no evidence of acute ischemic changes. No intracranial mass effect. Subtle white matter hypodensity in the right parietal lobe most compatible with chronic small vessel ischemia.   ASSESSMENT AND PLAN: A 54 year old female presenting to the emergency department with the chief complaint of dizziness with history of metastatic lung cancer with metastasis to bone is presenting to the emergency department with intractable nausea and dizziness for the past 2 days.   ASSESSMENT AND PLAN: 1.  Near syncope associated with nausea. Will admit her to telemetry, provide her IV fluids, antinausea medication will be continued which are Zofran, Phenergan and dronabinol.    2.  Metastatic lung cancer with bony metastasis as well as pleuritic chest pain from underlying cancer. The patient is on chemotherapy and radiation therapy. We will put a consult in to Dr. Grayland Ormond.  3.  Chronic chest pain, probably from underlying lung cancer.  4.  Hypomagnesemia. We will provide magnesium sulfate IV.  5.  We will provide gastrointestinal and deep vein thrombosis prophylaxis.   She is FULL code. Sister is the medical power of attorney.   Plan of care was discussed in detail with the patient and her sister at bedside. They both verbalized understanding of the plan.     ____________________________ Nicholes Mango, MD ag:sb D: 08/20/2013 15:04:05 ET T: 08/20/2013 16:15:11 ET JOB#: [id number]  cc: Nicholes Mango, MD, <Dictator> Kathlene November. Grayland Ormond, MD  Nicholes Mango MD ELECTRONICALLY SIGNED 09/03/2013 11:24

## 2014-04-24 NOTE — Consult Note (Signed)
History of Present Illness:  Reason for Consult Lung Cancer   Date of Diagnosis 27-Jan-2013   Previous Investigations completed in [place].   HPI   Stage IV Lung cancer s/p cycle 5 of carboplatin, Taxol, and Avastin on 05/21/13.Marland Kitchen Patient presents to ED with nausea,vomiting and diarrhea for 3 days.NO chest pain. No abdominal pain. NO fever.  PFSH:  Additional Past Medical and Surgical History negative.  Family history: Mother with lung cancer, father with prostate cancer.  Social history: Tobacco as above, occasional alcohol.   Review of Systems:  General weakness   Performance Status (ECOG) 2   HEENT no complaints   Lungs no complaints   Cardiac no complaints   GI nausea  vomiting   GU no complaints   Musculoskeletal no complaints   Extremities no complaints   Skin no complaints   Neuro no complaints   Endocrine no complaints   Psych no complaints   NURSING NOTES: ED Vital Sign Flow Sheet:   07-Jun-15 08:23   Pulse Pulse: 112   Respirations Respirations: 18   SBP SBP: 122   DBP DBP: 98   Pulse Ox % Pulse Ox %: 98   Pulse Ox Source Source: Room Air   Physical Exam:  General comfortable lying in bed sleepy,in no acute distress   HEENT: normal   Lungs: crepitations   Cardiac: regular rate, rhythm   Breast: not examined   Abdomen: soft  nontender  positive bowel sounds   Skin: intact   Extremities: No edema, rash or cyanosis   Neuro: AAOx3   Psych: normal appearance    No Known Allergies:   Laboratory Results: Hepatic:  07-Jun-15 06:07   Bilirubin, Total 0.7  Alkaline Phosphatase 79 (45-117 NOTE: New Reference Range 11/21/12)  SGPT (ALT) 14  SGOT (AST) 23  Total Protein, Serum 6.8  Albumin, Serum 3.5  Routine Chem:  07-Jun-15 06:07   Magnesium, Serum 1.8 (1.8-2.4 THERAPEUTIC RANGE: 4-7 mg/dL TOXIC: > 10 mg/dL  -----------------------)  Glucose, Serum  106  BUN 7  Creatinine (comp)  0.55  Sodium, Serum 138   Potassium, Serum  3.4  Chloride, Serum 104  CO2, Serum  20  Calcium (Total), Serum 8.7  Osmolality (calc) 274  eGFR (African American) >60  eGFR (Non-African American) >60 (eGFR values <34m/min/1.73 m2 may be an indication of chronic kidney disease (CKD). Calculated eGFR is useful in patients with stable renal function. The eGFR calculation will not be reliable in acutely ill patients when serum creatinine is changing rapidly. It is not useful in  patients on dialysis. The eGFR calculation may not be applicable to patients at the low and high extremes of body sizes, pregnant women, and vegetarians.)  Anion Gap 14  Cardiac:  07-Jun-15 06:07   Troponin I < 0.02 (0.00-0.05 0.05 ng/mL or less: NEGATIVE  Repeat testing in 3-6 hrs  if clinically indicated. >0.05 ng/mL: POTENTIAL  MYOCARDIAL INJURY. Repeat  testing in 3-6 hrs if  clinically indicated. NOTE: An increase or decrease  of 30% or more on serial  testing suggests a  clinically important change)  Routine UA:  07-Jun-15 06:07   Color (UA) Yellow  Clarity (UA) Clear  Glucose (UA) Negative  Bilirubin (UA) Negative  Ketones (UA) 2+  Specific Gravity (UA) 1.020  Blood (UA) 1+  pH (UA) 5.0  Protein (UA) 30 mg/dL  Nitrite (UA) Negative  Leukocyte Esterase (UA) Trace (Result(s) reported on 07 Jun 2013 at 06:33AM.)  RBC (UA) <1 /HPF  WBC (UA)  9 /HPF  Bacteria (UA) NONE SEEN  Epithelial Cells (UA) 2 /HPF  Mucous (UA) PRESENT (Result(s) reported on 07 Jun 2013 at 06:33AM.)  Routine Hem:  07-Jun-15 06:07   WBC (CBC)  3.4  RBC (CBC)  3.15  Hemoglobin (CBC)  10.3  Hematocrit (CBC)  31.0  Platelet Count (CBC)  43  MCV 98  MCH 32.6  MCHC 33.2  RDW  19.8  Neutrophil % 82.5  Lymphocyte % 10.6  Monocyte % 6.4  Eosinophil % 0.1  Basophil % 0.4  Neutrophil # 2.8  Lymphocyte #  0.4  Monocyte # 0.2  Eosinophil # 0.0  Basophil # 0.0 (Result(s) reported on 07 Jun 2013 at 06:30AM.)   Assessment and Plan: Impression:    Stage IV adenocarcinoma of the lung with bone metastasis.  EGFR and ALK negative. s/p cycle 5 Carbo/Taxol/Avastin 05/21/13.with severe nausea,vomiting.-Will do brain MRIneutropenic.on regular antiemetics as well as IV hydration. Plan:   as above  Electronic Signatures: Georges Mouse (MD)  (Signed 07-Jun-15 14:58)  Authored: HISTORY OF PRESENT ILLNESS, PFSH, ROS, NURSING NOTES, PE, ALLERGIES, LABS, ASSESSMENT AND PLAN   Last Updated: 07-Jun-15 14:58 by Georges Mouse (MD)

## 2014-04-24 NOTE — Discharge Summary (Signed)
PATIENT NAME:  [name], [name] MR#:  [id number] DATE OF BIRTH:  17-Nov-1960  DATE OF ADMISSION:  08/20/2013 DATE OF DISCHARGE:  08/22/2013  DISCHARGE DIAGNOSES: 1.  Chronic nausea.  2.  Chronic dizziness.  3.  Chronic pain syndrome.  4.  Stage IV lung cancer with bone metastasis.   5.  Weakness.   DISCHARGE MEDICATIONS: 1.  Omeprazole 20 mg oral once a day.  2.  Vitamin B12, 500 mcg daily.  3.  Dulcolax stool softener 100 mg 2 times a day.  4.  Compazine 10 mg every 6 hours as needed for nausea.  5.  Dronabinol 5 mg oral 2 times a day. 6.  Zofran 8 mg oral every 8 hours as needed for nausea or vomiting.  7.  Senna 1 tablet oral 2 times a day. 8.  Lidocaine 5% topical 2 patches affected area once a day.  9.  Multivitamin 1 tablet daily.  10.  Hydromorphone 2 mg 1 to 2 tablets every 4 hours as needed for pain.  11.  Gabapentin 300 mg oral 2 times a day.  12.  Granisetron 1 mg oral every 12 hours.  13.  Fentanyl 75 mcg transdermal every 72 hours.  14.  Ativan 0.5 mg oral 2 times a day as needed.  15.  Olanzapine 2.5 mg oral once a day.   IMAGING STUDIES DONE: CT scan of the head without contrast, which showed no acute abnormalities. Chest PA and lateral, showed stable reticulonodular interstitial opacities in right upper lobe and right perihilar region, nothing acute.   CONSULTS: Efraim Kaufmann, MD, with palliative care; Martie Lee. Choksi, MD, with oncology.   ADMITTING HISTORY AND PHYSICAL: Please see detailed H and P dictated by Dr. Margaretmary Eddy. In brief, a 54 year old female patient with history of chronic nausea, dizziness, pain secondary to lung cancer with bone metastasis, presented to the hospital complaining of nausea, dizziness. Was admitted to hospitalist service for further workup.   The patient was initially monitored on a telemetry floor for concern for any arrhythmias for her dizziness. The patient was started on a trial of meclizine with which she did not improve, which was later  stopped. The patient has had chronic nausea and dizziness. Unfortunately, the patient has been changing her medications on her own, not following recommendations. She did stop all of her medications except the granisetron and the fentanyl patch prior to admission. All her medications were restarted. The patient does insist that she believes her nausea and dizziness are secondary to pain secondary to one of her medications. She has had 2 recent MRIs to rule out for any strokes or metastasis which were negative. I have discussed with the patient regarding no acute illness. She does have chronic dizziness and nausea and it is unclear how patient can be helped at this point other than medications and she is not keen on starting any new medications. She did ask for Ativan to try and I think it will help her anxiety. She was also started on a small dose of Zyprexa as recommended by palliative care for her nausea which seems to be causing her dizziness.   Prior to discharge, the patient's pain is well controlled. Her dizziness is improved. She does not have any vomiting. Does have chronic nausea. Lungs sound clear. Heart sounds S1, S2.   DISCHARGE INSTRUCTIONS: Regular diet, regular activity using a walker, home health physical therapy has been set up. Follow up with her oncologist in a week.   Time spent on  day of discharge in discharge activity was 42 minutes.   ____________________________ Leia Alf Jb Dulworth, MD srs:LT D: 08/23/2013 12:12:00 ET T: 08/23/2013 18:44:07 ET JOB#: [id number]  cc: Alveta Heimlich R. Zanyiah Posten, MD, <Dictator> Kathlene November. Grayland Ormond, MD Neita Carp MD ELECTRONICALLY SIGNED 09/15/2013 16:29

## 2014-04-24 NOTE — Discharge Summary (Signed)
PATIENT NAME:  [name], ADORNO MR#:  [id number] DATE OF BIRTH:  28-Jun-1960  DATE OF ADMISSION:  08/08/2013 DATE OF DISCHARGE:  08/13/2013  PRIMARY CARE PHYSICIAN:  Kathlene November. Grayland Ormond, MD.  FINAL DIAGNOSES:  1.  Nausea, vomiting, diarrhea.  2.  Hypokalemia, hypomagnesemia.  3.  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  4  Leukopenia.   MEDICATIONS ON DISCHARGE: Include: Omeprazole 20 mg daily, biotin 1000 mcg daily, vitamin B12 take 500 mcg daily, Dulcolax stool softener 100 mg twice a day, Compazine 10 mg every 6 hours as needed for nausea, Marinol 5 mg twice a day, Zofran 8 mg every 8 hours as needed for nausea and vomiting, Senna 1 tablet twice a day, multivitamin 1 tablet daily, fentanyl 150 mcg topically every 3 days, hydromorphone 2 mg 1-2 tablets every 4 hours as needed for pain, gabapentin 300 mg twice a day, dexamethasone 4 mg twice a day, metoclopramide 10 mg 4 times a day, Kytril 1 mg every 12 hours, lidocaine patch 5% topical film 2 patches once a day to  ribs posteriorly and anteriorly.   FOLLOWUP: With Dr. Grayland Ormond in 1-2 weeks.   DIET: Regular, regular consistency.   ACTIVITY: As tolerated.   HOSPITAL COURSE: The patient was admitted 08/08/2013 and discharged 08/13/2013. She came in with nausea and vomiting.   LABORATORY AND RADIOLOGICAL DATA DURING THE HOSPITAL COURSE: Lipase 58, glucose 105, BUN 5, creatinine 0.58, sodium 139, potassium 3.4, chloride 106, CO2 of 22, calcium 8.3. Liver function tests normal range. Albumin 2.8, white blood cell count 3.7, H and H 11.3 and 34.2, platelet count of 141. Blood cultures negative, magnesium 1.7.   On discharge: White count 11.4, hemoglobin 11, platelet count 169. Glucose 119, BUN 10, creatinine 0.81, sodium 138, potassium 4.2, chloride 108, CO2 of 25.   CHEST X-RAY: No acute abnormalities.   Stable chest radiograph with nonspecific findings mid upper right lung secondary to carcinoma.   HOSPITAL COURSE PER PROBLEM  LIST:  1.  Nausea, vomiting, and diarrhea: The patient's vomiting and diarrhea has subsided, now has some constipation. The patient has persistent nausea, that is why she is on numerous nausea medications.  This has been a persistent issue for her affecting her quality of life.  2.  Hypokalemia and hypomagnesemia: These were replaced during the hospital course.  If she is eating a regular diet and tolerating it this should not be a problem.  3.  Stage IV lung cancer metastatic to bone with bone pain: I did prescribe a Lidoderm patch which helped her bone pain on her right ribs and back. Dr. Ermalinda Memos, palliative care, prescribed dexamethasone, increased her fentanyl patch and we did do Dilaudid orally, which seems to be controlling her pain. Dr. Grayland Ormond can consider Zometa as an outpatient for bone pain. Overall, prognosis for this is poor. The patient remains a full code. Follow up with Dr. Grayland Ormond for further chemotherapy.  4.  Leukopenia: I did give the patient a dose of Neupogen. White count improved upon discharge. Overall prognosis is poor with diagnosis.   ____________________________ Tana Conch. Leslye Peer, MD rjw:lt D: 08/13/2013 14:25:04 ET T: 08/13/2013 15:52:38 ET JOB#: [id number]  cc: Tana Conch. Leslye Peer, MD, <Dictator> Kathlene November. Grayland Ormond, MD Marisue Brooklyn MD ELECTRONICALLY SIGNED 09/04/2013 15:02

## 2014-04-24 NOTE — Discharge Summary (Signed)
PATIENT NAME:  [name], [name] MR#:  [id number] DATE OF BIRTH:  1960-08-02  DATE OF ADMISSION:  09/27/2013 DATE OF DISCHARGE:  09/29/2013  PRIMARY CARE PHYSICIAN:  None local.   DISCHARGE DIAGNOSES: Intractable nausea with poor oral intake and weakness, metastatic lung cancer with bony metastasis, chronic pain with narcotic dependence.   CONDITION: Stable.   CODE STATUS: Full code.   HOME MEDICATIONS: Please refer to the medication reconciliation list.   DIET: Regular diet.   ACTIVITY: As tolerated.   FOLLOWUP CARE: Followup with the PCP 1 to 2 weeks. Follow up to Dr. Grayland Ormond today chemotherapy.    REASON FOR ADMISSION: Dizziness, nausea, and vomiting.    HOSPITAL COURSE: The patient is a 54 year old Caucasian female with a history of metastatic lung cancer, presented to the ED with dizziness, nausea, and vomiting. The patient got chemotherapy and radiation therapy 3 weeks ago. The patient had tachycardia in the 120s. In the ED, she received IV fluids. For a detailed history and physical examination, please refer to the admission note dictated by Dr. Fritzi Mandes. On admission date, the patient's laboratory data was unremarkable. Urinalysis is negative.  Chest x-ray: No acute cardiopulmonary abnormality other than persistent densities throughout the right upper chest.  1.  The patient was admitted for intractable nausea with poor oral intake and weakness. On admission, the patient has been treated with IV fluid support with Zofran p.r.n. for nausea, but the patient still feels not good, with nausea. She feels dehydrated. We will give her 1 dose of 500 mL normal saline today and then the patient may be discharged to home today.  2.  Metabolic lung cancer with bony metastasis. The patient will be started on chemotherapy  from today. She has an appointment with Dr. Grayland Ormond today.  3.  Chronic pain with narcotics dependence. The patient has been treated with Dilaudid, with a fentenyl patch, but the  patient still complains of body pain.  4.  Generally, the patient is clinically stable. She is going to be discharged to home today. I discussed the patient's discharge plan with the patient, nurse, and case manager.   TIME SPENT: About 36 minutes.   ____________________________ Demetrios Loll, MD qc:lr D: 09/29/2013 11:57:19 ET T: 09/29/2013 13:25:35 ET JOB#: [id number]  cc: Demetrios Loll, MD, <Dictator> Demetrios Loll MD ELECTRONICALLY SIGNED 09/29/2013 16:31
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
## 2014-04-24 NOTE — Consult Note (Signed)
History of Present Illness:  Reason for Consult Stage IV adenocarcinoma of the lung with bone metastasis.  EGFR and ALK negative. Plan:  1.  Lung cancer: Proceed with cycle 5 of carboplatinum, Taxol, and Avastin. Patient will receive Zometa on her odd number of cycles, which she received today.  Proceed with Neulasta now that she has finished XRT. Return to clinic tomorrow for Neulasta, in 1 week for laboratory work and IV fluids, then in 3 weeks for consideration of cycle 6.  Plan to reimage with CT scan and bone scan in the next several weeks. Patient understands he can return to clinic at any time if she has any questions, concerns, complaints. 2.  Pain: Continue MSContin to 30 mg twice daily. Continue Percocet as needed. 3.  Cough: Continue hydrocodone cough syrup as needed. 4.  Nausea: Continue Compazine and Phenergan as needed. 5.  Sleep: Patient was previously given a prescription for Ambien.   HPI   Patient had recurrent admission in the hospital with progressive weakness.  Poor appetite.  Nausea.  Hospital-acquired pneumonia.  Myelosuppressionis gradually improving.  Appetite is in still poor.  Patient complains of the face.was asked to evaluate patient in absence of Dr. Grayland Ormond  PFSH:  Family History negative   Additional Past Medical and Surgical History Allergies:  No Known Allergies:   Smoking History: Smoking History Quit 1998, 20 year history(1)  PFSH: Additional Past Medical and Surgical History: negative.    Family history: Mother with lung cancer, father with prostate cancer.    Social history: Tobacco as above, occasional alcohol.   Review of Systems:  General weakness  fatigue   Performance Status (ECOG) 1   HEENT no complaints   Lungs cough  SOB   Cardiac no complaints   GI poor appetite   GU no complaints   Extremities no complaints   Skin no complaints   Neuro no complaints   Endocrine no complaints   Psych no complaints   NURSING  NOTES: **Vital Signs.:   15-Jun-15 19:35   Vital Signs Type: Routine   Temperature Temperature (F): 97.5   Celsius: 36.3   Pulse Pulse: 109   Respirations Respirations: 20   Systolic BP Systolic BP: 182   Diastolic BP (mmHg) Diastolic BP (mmHg): 77   Mean BP: 87   Pulse Ox % Pulse Ox %: 92   Pulse Ox Activity Level: At rest   Oxygen Delivery: Room Air/ 21 %   Physical Exam:  General patient is alert oriented not in any acute distress   HEENT: alopecia   Lungs: crepitations   Cardiac: tachycardia   Abdomen: soft   Skin: intact   Musculoskeletal: swollen/deformed joints   Extremities: No edema, rash or cyanosis   Neuro: AAOx3   Psych: normal appearance    No Known Allergies:     Levaquin 500 mg oral tablet: 1 tab(s) orally every 24 hours x 7 days, Status: Active, Quantity: 7, Refills: None   acetaminophen-oxyCODONE 325 mg-5 mg oral tablet: 2 tab(s) orally every 4 hours, As Needed, Status: Active, Quantity: 280, Refills: None   MS Contin 30 mg/12 hr oral tablet, extended release: 1 tab(s) orally every 12 hours, Status: Active, Quantity: 60, Refills: None   chlorpheniramine-HYDROcodone 8 mg-10 mg/5 mL oral suspension, extended release: 5 milliliter(s) orally every 12 hours, As Needed, Status: Active, Quantity: 240, Refills: None   Tessalon Perles 100 mg capsule: 1 cap(s) orally 3 times a day x 10 days as needed for cough, Status: Active,  Quantity: 30, Refills: 1   promethazine 12.5 mg oral tablet: 1 tab(s) orally every 6 hours, As Needed - for Nausea, Vomiting, Status: Active, Quantity: 120, Refills: 1   Compazine tablet 10 mg: 1 tab(s) orally every 6 hours x 30 days as needed  for nausea., Status: Active, Quantity: 120, Refills: 5   multivitamin: 1   once a day, Status: Active, Quantity: 0, Refills: None   omeprazole 20 mg oral delayed release capsule: 1 cap(s) orally once a day, Status: Active, Quantity: 0, Refills: None   biotin 1000 mcg oral  tablet: 1 tab(s) orally once a day, Status: Active, Quantity: 0, Refills: None   Vitamin B12 500 mcg oral tablet: 1 tab(s) orally once a day, Status: Active, Quantity: 0, Refills: None   Dulcolax Stool Softener sodium 100 mg oral capsule: 1 cap(s) orally 2 times a day, Status: Active, Quantity: 0, Refills: None  Radiology Results: LabUnknown:    12-Jun-15 00:28, CT Abdomen and Pelvis With Contrast  PACS Image   CT:  CT Abdomen and Pelvis With Contrast   REASON FOR EXAM:    (1) LLQ pain nausea, fever; (2) LLQ pain nausea, fever  COMMENTS:       PROCEDURE: CT  - CT ABDOMEN / PELVIS  W  - Jun 12 2013 12:28AM     CLINICAL DATA:  Weakness, nausea, vomiting, diarrhea. Currently  receiving chemotherapy forlung cancer. Left lower quadrant pain,  fever, nausea.    EXAM:  CT ABDOMEN AND PELVIS WITH CONTRAST    TECHNIQUE:  Multidetector CT imaging of the abdomen and pelvis was performed  using the standard protocol following bolus administration of  intravenous contrast.    CONTRAST:  100 mL Isovue-300. Patient refused oral contrast  material.    COMPARISON:  05/26/2013    FINDINGS:  Small right pleural effusion with basilar atelectasis, similar to  the prior study. Small pericardial effusion.    Diffuse fatty infiltration of the liver. Large stone in the lower  pole right kidney measuring 11 mm diameter. Associated scarring in  the lower pole right kidney. Focal areas of scarring in both kidneys  associated with talus seal dilatation suggesting reflux nephropathy.  No hydronephrosis or solid mass in either kidney. The gallbladder,  spleen, adrenal glands, pancreas, inferior vena cava, abdominal  aorta, and retroperitoneal lymph nodes are unremarkable. Ureters are  nondilated and no ureteral stones are seen. The stomach and small  bowel are decompressed. Colon is decompressed. No free air or free  fluid in the abdomen.    Pelvis: The appendix is normal. No free or loculated  pelvic fluid  collections. Diverticula in the sigmoid colon without  diverticulitis. Uterus and ovaries are not enlarged. No pelvic  lymphadenopathy or mass. Bladder wall is not thickened.  Circumscribed lucent and sclerotic bone lesion in the right femoral  head/neck appears similar prior study and may represent a metastatic  lesion. No other focal bone lesions identified.   IMPRESSION:  Small right pleural effusion and pericardial effusion similar to  previous study diffuse fatty infiltration of the liver. Large stone  in the lower pole right kidney without obstruction. Focal scarring  in both kidneys. No acute inflammatory process suggested. With  probable metastatic lesion demonstrated in the right proximal femur.      Electronically Signed    By: Lucienne Capers M.D.    On: 06/12/2013 00:47         Verified By: Neale Burly, M.D.,   Assessment and Plan:  Impression:   stage IV carcinoma of lungstatus post chemotherapyto thrive the suppression secondary to chemotherapy Plan:   recheck blood count if blood count is improving particularly in she is more than 1000 and patient is afebrile all antibiotics can be discontinuedappetite stimulant we will try Megacemonitoring is not indicated can be discontinuedto get patient out of bed in chair and walking and ambulatingcan be been discharged with advice to get followup with Dr. Grayland Ormond as outpatient  Electronic Signatures: Jobe Gibbon (MD)  (Signed 16-Jun-15 14:59)  Authored: HISTORY OF PRESENT ILLNESS, PFSH, ROS, NURSING NOTES, PE, ALLERGIES, HOME MEDICATIONS, OTHER RESULTS, ASSESSMENT AND PLAN   Last Updated: 16-Jun-15 14:59 by Jobe Gibbon (MD)

## 2014-04-24 NOTE — Consult Note (Signed)
Pt feels this is worst episode of vomiting after chemo and would like to have EGD to make sure nothing else going on in stomach.  Plan for this tomorrow late morning.  Electronic Signatures: Manya Silvas (MD)  (Signed on 26-Oct-15 19:48)  Authored  Last Updated: 26-Oct-15 19:48 by Manya Silvas (MD)

## 2014-04-24 NOTE — Consult Note (Signed)
Brief Consult Note: Diagnosis: Metatstatic lung cancer.   Comments: Patient of Dr Grayland Ormond s/p Carbo/Taxol/Avastin -cycle 6-1 week ago May improve with dex added for nausea Bone pain- has mets to right femur head Agree with current opiate regimen. Will follow.  Electronic Signatures: Georges Mouse (MD)  (Signed 12-Jul-15 11:33)  Authored: Brief Consult Note   Last Updated: 12-Jul-15 11:33 by Georges Mouse (MD)

## 2014-04-24 NOTE — Consult Note (Signed)
PATIENT NAME:  [name], [name] MR#:  [id number] DATE OF BIRTH:  Jul 12, 1960  DATE OF CONSULTATION:  10/26/2013  REQUESTING PHYSICIAN:  Nicholes Mango, MD CONSULTING PHYSICIAN:  Theodore Demark, NP  REASON FOR CONSULTATION: GI consult ordered by Dr. Margaretmary Eddy for evaluation of persistent nausea, vomiting.   HISTORY OF PRESENT ILLNESS: I appreciate consult for a 54 year old Caucasian woman with history of stage IV adenocarcinoma of the lung with bone metastasis. Last chemotherapy, 10/20/2013, with Avastin. For evaluation of nausea, vomiting. Describes persistent nausea and vomiting starting around this time with some yellowish loose stools. Denies abdominal pain and fever; p.o. intake decreased greatly. States she is passing flatus well and her stools have been improved in form over the last couple of days. Does report that she has never had nausea, vomiting with her chemotherapy in past, although has had complaints and admissions including nausea and vomiting in July, August, and September of this year. She is on several home medications for nausea. Nausea, vomiting, diarrhea is listed as a side effect of the Avastin. States no problems swallowing. No signs of blood or blackness to her stools. She has no history of colonoscopy or EGD. She was started on Rocephin and azithromycin yesterday for bronchitis. She was treated with some Flagyl for possible C. difficile. States this greatly increased her nausea and vomiting. This has since been discontinued because of those complaints. She has received Zofran, Reglan, promethazine while she has been here with some benefit.  Imaging-wise, 10/25/2013, CT scan of abdomen and pelvis noncontrasted with possible biliary sludge, but no cholecystitis. There was hepatic steatosis. There was a nonobstructing kidney stone with small pleural effusion. Small amount of pericardial fluid of doubtful significance, diverticulosis without diverticulitis. Stool culture and Hemoccult test  pending.  Had bone scan July 2015 with likely metastasis to right femoral head, C6 vertebral body, left occipital bone, left 5th rib and right ribs 4 and 7.  PET scan July 15 with increased uptake to right upper, lower, middle lung with most activity in the right lower lobe. Abdominal US, 06/15, unremarkable. Liver panel was low, but improving albumin, otherwise unremarkable.  REVIEW OF SYSTEMS:  Ten systems reviewed. Significant for cough, mild shortness of breath at times. Otherwise, unremarkable other than what is noted above.   PAST MEDICAL HISTORY: Stage IV lung cancer with bone metastasis, GERD.   SOCIAL HISTORY: Remote tobacco use, occasional alcohol. No illicits.   FAMILY HISTORY: Mother with lung cancer.   ALLERGIES: NKDA.   HOME MEDICATIONS: Fentanyl 75 mcg transdermal patch q. 72 h., hydromorphone 2 mg p.o. q. 4 h. p.r.n., Compazine 10 mg p.o. q. 6 h. p.r.n. nausea, dronabinol 5 mg p.o. b.i.d.,  1 mg p.o. b.i.d., Zofran 8 mg p.o. q. 8 h. p.r.n. nausea, vomiting, lidocaine 5% topical to affected area once daily p.r.n., senna 8.6 mg p.o. b.i.d., Prilosec 20 mg p.o. daily, multivitamin once a day, vitamin B12 500 mcg once a day.  LABORATORY DATA: Most recent, glucose 77, BUN 3, creatinine 0.52, sodium 142, potassium 3.7. GFR greater than 60, calcium 7.4, total protein 6.1, albumin 3.0, total bilirubin 0.5, ALP 57, AST 30, ALT 21. WBC 3.1, hemoglobin 11.5, hematocrit 35.7, platelets 198,000. Red cells normocytic with increased RDW.   CT as noted above.   PHYSICAL EXAMINATION: VITAL SIGNS: Most recent, temperature 98.3, pulse 104, respirations 20, blood pressure 146/98, SaO2 95% on room air.  GENERAL: Chronically ill-appearing woman lying in bed in no acute distress.  HEENT: Normocephalic, atraumatic. Conjunctivae pink. Sclerae  are clear.  NECK: Supple. No thyromegaly, JVD.  CHEST: Respirations eupneic, does have nonproductive cough. Lung sounds minimally decreased bilaterally to the  bases, able to speak well. No increased work of breathing.  CARDIAC: S1, S2, RRR. No MRG. No appreciable edema.  ABDOMEN: Bowel sounds x 4. Soft, nontender, nondistended. No guarding, rigidity, peritoneal signs, hepatosplenomegaly, or other abnormalities.  MUSCULOSKELETAL: No clubbing or cyanosis. Strength 5/5. Sensation intact.  NEUROLOGICAL: Alert, oriented x 3. Cranial nerves II through XII intact. Speech clear. No facial droop.  SKIN: Warm, dry, pink. No erythema, lesion, or rash. She does have a port to the right chest.  PSYCHIATRIC: Pleasant, calm, cooperative.   IMPRESSION AND PLAN: Nausea, vomiting, likely medication side effects,.  Will schedule for her Zofran, leave the Phenergan, Reglan p.r.n.   I did discuss this further with Dr. Vira Agar. We will plan for esophagogastroduodenoscopy tomorrow for luminal evaluation. We will try to do this later in the morning.   Thank you very much for this consult. These services were provided by Stephens November, MSN, [facility], in collaboration with Dr. Gaylyn Cheers with whom I have discussed this patient in full.    ____________________________ Theodore Demark, NP chl:LT D: 10/26/2013 17:44:29 ET T: 10/26/2013 18:05:26 ET JOB#: [id number]  cc: Theodore Demark, NP, <Dictator> [place] SIGNED 12/08/2013 17:20

## 2014-04-24 NOTE — H&P (Signed)
PATIENT NAME:  [name], BARTELT MR#:  [id number] DATE OF BIRTH:  Apr 02, 1960  DATE OF ADMISSION:  06/07/2013  PRIMARY CARE PHYSICIAN:  Non-local.  CHIEF COMPLAINT: Nausea, vomiting, diarrhea for 3 days, shortness of breath and cough.   HISTORY OF PRESENT ILLNESS: A 54 year old Caucasian female with a history of lung cancer, with metastasis and pneumonia, presented to the ED with nausea, vomiting, diarrhea for 3 days. In addition, the patient complains of shortness of breath, cough, but denies any fever or chills. The patient denies any hematemesis. No dysuria, hematuria. No orthopnea, nocturnal dyspnea, or leg edema. The patient was noticed to have tachycardia and neutropenia. Chest x-ray showed possible suspected pneumonia. The patient was treated with vancomycin, Zosyn, and Levaquin, and admitted for sepsis with possible pneumonia. The patient got chemotherapy 3 weeks ago.   PAST MEDICAL HISTORY: Pneumonia, lung cancer with metastasis to liver and bone.    SURGICAL HISTORY: Port-A-Cath placement.   SOCIAL HISTORY: No smoking or drinking or illicit drugs.   FAMILY HISTORY: Mother had breast cancer and lung cancer. Father had colon cancer.   ALLERGIES: None.   HOME MEDICATIONS:  Vitamin B12, 500 mcg p.o. daily, Tessalon 100 mg p.o. t.i.d., p.r.n., promethazine 12.5 mg p.o. every 6 hours p.r.n., omeprazole 20 mg p.o. daily, multivitamin 1 tablet p.o. daily, MS Contin 30 mg per 12 hours 1 tablet every 12 hours, Dulcolax stool softener 100 mg 1 cap b.i.d., Compazine 10 mg p.o. every 6 hours p.r.n., chlorpheniramine/hydrocodone 8 mg/10 mg per 5 mL oral suspension 5 mL every 12 hours p.r.n., Biotin 1000 mcg p.o. daily, Percocet 325/5 mg p.o. tablets 2 tablets every 4 hours p.r.n.   REVIEW OF SYSTEMS:    CONSTITUTIONAL: The patient denies any fever or chills. No headache or dizziness, but has generalized weakness and body aching all over.  EYES: No double vision or blurry vision. ENT: No postnasal drip,  slurred speech or dysphagia.  CARDIOVASCULAR: No chest pain, palpitation, orthopnea, nocturnal dyspnea. No leg edema.  PULMONARY: Positive for cough, sputum, shortness of breath. No hemoptysis. No wheezing.  CARDIOVASCULAR: No chest pain, palpitation, orthopnea, nocturnal dyspnea. No leg edema.  GASTROINTESTINAL: No abdominal pain, but has nausea, vomiting, and diarrhea once. GENITOURINARY:  No dysuria, hematuria, or incontinence.  SKIN: No rash or jaundice.  NEUROLOGY: No syncope, loss of consciousness, or seizure.  ENDOCRINE: No polyuria, polydipsia, heat or cold intolerance.  HEMATOLOGY: No easy bleeding or bruising.    VITAL SIGNS: Temperature 98.4, blood pressure 139/93, pulse was 124, oxygen saturation 99% on room air.   PHYSICAL EXAMINATION:  GENERAL: The patient is alert, awake, oriented, in no acute distress.  HEENT: Pupils round, equal, react to light and accommodation. Moist oral mucosa. Clear oropharynx.  NECK: Supple. No JVD or carotid bruits. No lymphadenopathy. No thyromegaly.  CARDIOVASCULAR: S1, S2. Regular rate and rhythm. No murmurs or gallops. Tachycardia.  PULMONARY: Bilateral air entry. No wheezing or rales, but has some mild crackles. No use of accessory muscles to breathe.  ABDOMEN: Soft. No distention or tenderness. No organomegaly. Bowel sounds present.  EXTREMITIES: No edema, clubbing, or cyanosis. No calf tenderness. Bilateral pedal pulses present.  NEUROLOGIC: Alert and oriented x 3. No focal deficit. Power 5/5. Sensation intact.   LABORATORY DATA:  Chest x-ray showed findings indicative of significant residual disease in the peripheral aspect of right upper lobe, with likely residual right hilar lymphadenopathy and post-obstructive pneumonitis or pneumonia, or post-procedure changes in the right lung.  Urinalysis is negative, except WBC 9.  Troponin less than 0.02. WBC 3.4, hemoglobin 10.3, platelets 43. Glucose 106, BUN 7, creatinine 0.55, sodium 138, potassium  3.4, chloride 104, magnesium 1.8. EKG shows sinus tachycardia at 124.   IMPRESSIONS: 1.  Systemic inflammatory response syndrome, possible sepsis.  2.  Pneumonia.  3.  Hypokalemia.  4.  Anemia.  5.  Lung cancer with metastasis.  6.  Thrombocytopenia.  PLAN OF TREATMENT: 1.  The patient will be admitted to medical floor. Will continue vancomycin, Zosyn, Levaquin. Follow up CBC, blood culture, sputum culture.  2.  Will give potassium and follow up BMP. Magnesium level is normal.  3.  Will get Oncology consult from Dr. Grayland Ormond.   I discussed patient's condition and plan of treatment with the patient.   The patient wants FULL CODE.   TIME SPENT: About 56 minutes.     ____________________________ Demetrios Loll, MD qc:mr D: 06/07/2013 14:10:08 ET T: 06/07/2013 16:53:04 ET JOB#: [id number]  cc: Demetrios Loll, MD, <Dictator> Demetrios Loll MD ELECTRONICALLY SIGNED 06/08/2013 17:00

## 2014-04-24 NOTE — Consult Note (Signed)
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 EGFR and ALK negative, now with worsening pain and shortness of breath.  HPI:   Patient readmitted to the hospital approximately one week after discharge again with worsening pain and difficulty breathing. Currently, patient still feels short of breath but improved since admission. She continues to feel weak and fatigued. She continues to have a poor appetite and nausea. She does not complain of dizziness or other neurologic complaints today.  She denies any recent fevers. She has no urinary complaints. Patient offers no further specific complaints today.     Review of Systems:  Performance Status (ECOG): 2  Psych: no complaints  Pain ?: Yes  Pain- Qualitative: Moderate  Pain- Plan: Narcotic analgesic ordered  Discussed with patient: Prohylactic stimulant laxative  Stool softner  Dietary fiber  Fluids  Pain Effectiveness: Pain relief obtained  Emotional well-being: None  Review of Systems: All other systems were reviewed and found to be negative  Review of Systems:   As per HPI. Otherwise, 10 point system review was negative.   Allergies:  No Known Allergies:   Smoking History: Smoking History Quit 1998, 20 year history.  PFSH: Additional Past Medical and Surgical History: negative.    Family history: Mother with lung cancer, father with prostate cancer.    Social history: Tobacco as above, occasional alcohol.   Home Medications: Medication Instructions Last Modified Date/Time  valACYclovir 1 g oral tablet 1 tab(s) orally every 8 hours for 7 days 29-Dec-15 11:18  amoxicillin-clavulanate 500 mg-125 mg oral tablet 1 tab(s) orally 2 times a day for 10 days 29-Dec-15 11:18  Zofran 8 mg oral tablet 1 tab(s) orally every 8 hours, As Needed - for Nausea, Vomiting 29-Dec-15 11:18  granisetron 1 mg oral tablet 1 tab(s) orally every 12 hours 29-Dec-15 11:18   omeprazole 20 mg oral delayed release capsule 1 cap(s) orally once a day 29-Dec-15 11:18  dronabinol 5 mg oral capsule 1 cap(s) orally 2 times a day 29-Dec-15 11:18  lidocaine 5% topical film Apply topically to affected area once a day 29-Dec-15 11:18  Senna 8.6 mg oral tablet 1 tab(s) orally 2 times a day 29-Dec-15 11:18  sucralfate 1 g/10 mL oral suspension 10 milliliter(s) orally 4 times a day (with meals and at bedtime) 29-Dec-15 11:18  gabapentin 300 mg oral capsule 1 cap(s) orally 3 times a day 29-Dec-15 11:18  prochlorperazine 10 mg oral tablet 1 tab(s) orally every 6 hours, As Needed - for Nausea, Vomiting 29-Dec-15 11:18  fentaNYL 75 mcg/hr transdermal film, extended release 1 patch transdermal every 72 hours 29-Dec-15 11:18  HYDROmorphone 2 mg oral tablet 1 tab(s) orally every 4 hours, As Needed - for Pain 29-Dec-15 11:18   Vital Signs:  :: vital signs stable, patient afebrile.   Physical Exam:  General: well developed, well nourished, and in no acute distress  Mental Status: normal affect  Eyes: anicteric sclera  Respiratory: clear to auscultation bilaterally  Cardiovascular: regular rate and rhythm, no murmur, rub, or gallop  Gastrointestinal: soft, nondistended, nontender, no organomegaly.  normal active bowel sounds  Musculoskeletal: No edema  Skin: No rash or petechiae noted  Neurological: alert, answering all questions appropriately.  Cranial nerves grossly intact   Laboratory Results: Hepatic:  29-Dec-15 12:01   Bilirubin, Total 0.3  Alkaline Phosphatase 58 (46-116 NOTE: New Reference Range 07/21/13)  SGPT (ALT)  11 (14-63 NOTE: New Reference Range 07/21/13)  SGOT (AST)  19  Total Protein, Serum  6.3  Albumin, Serum  3.0  Routine Micro:  29-Dec-15 12:01   Micro Text Report BLOOD CULTURE   COMMENT                   NO GROWTH IN 18-24 HOURS   ANTIBIOTIC                       Culture Comment NO GROWTH IN 18-24 HOURS  Result(s) reported on 30 Dec 2013 at  12:00PM.  Routine Chem:  29-Dec-15 12:01   Glucose, Serum 94  BUN  5  Creatinine (comp) 0.73  Sodium, Serum 144  Potassium, Serum  3.2  Chloride, Serum  110  CO2, Serum 24  Calcium (Total), Serum 8.8  Osmolality (calc) 284  eGFR (African American) >60  eGFR (Non-African American) >60 (eGFR values <9m/min/1.73 m2 may be an indication of chronic kidney disease (CKD). Calculated eGFR, using the MRDR Study equation, is useful in  patients with stable renal function. The eGFR calculation will not be reliable in acutely ill patients when serum creatinine is changing rapidly. It is not useful in patients on dialysis. The eGFR calculation may not be applicable to patients at the low and high extremes of body sizes, pregnant women, and vegetarians.)  Anion Gap 10  B-Type Natriuretic Peptide ([facility]) 53 (Result(s) reported on 29 Dec 2013 at 12:31PM.)  Cardiac:  29-Dec-15 12:01   Troponin I < 0.02 (0.00-0.05 0.05 ng/mL or less: NEGATIVE  Repeat testing in 3-6 hrs  if clinically indicated. >0.05 ng/mL: POTENTIAL  MYOCARDIAL INJURY. Repeat  testing in 3-6 hrs if  clinically indicated. NOTE: An increase or decrease  of 30% or more on serial  testing suggests a  clinically important change)  CK, Total  25  CPK-MB, Serum  < 0.5 (Result(s) reported on 29 Dec 2013 at 12:31PM.)  Routine Coag:  29-Dec-15 12:01   Prothrombin 13.4  INR 1.0 (INR reference interval applies to patients on anticoagulant therapy. A single INR therapeutic range for coumarins is not optimal for all indications; however, the suggested range for most indications is 2.0 - 3.0. Exceptions to the INR Reference Range may include: Prosthetic heart valves, acute myocardial infarction, prevention of myocardial infarction, and combinations of aspirin and anticoagulant. The need for a higher or lower target INR must be assessed individually. Reference: The Pharmacology and Management of the Vitamin K  antagonists: the  seventh ACCP Conference on Antithrombotic and Thrombolytic Therapy. CXQJJH.4174Sept:126 (3suppl): [id number] A HCT value >55% may artifactually increase the PT.  In one study,  the increase was an average of 25%. Reference:  "Effect on Routine and Special Coagulation Testing Values of Citrate Anticoagulant Adjustment in Patients with High HCT Values." American Journal of Clinical Pathology 2006;126:400-405.)  Activated PTT (APTT) 25.5 (A HCT value >55% may artifactually increase the APTT. In one study, the increase was an average of 19%. Reference: "Effect on Routine and Special Coagulation Testing Values of Citrate Anticoagulant Adjustment in Patients with High HCT Values." American Journal of Clinical Pathology 2006;126:400-405.)  Routine Hem:  29-Dec-15 12:01   WBC (CBC) 5.0  RBC (CBC) 4.25  Hemoglobin (CBC) 12.8  Hematocrit (CBC) 38.6  Platelet Count (CBC) 220 (Result(s) reported on 29 Dec 2013 at 12:20PM.)  MCV 91  MCH 30.2  MCHC 33.2  RDW  17.9   Medical Imaging Results:   Review Medical Imaging   PA and Lateral 29-Dec-2013 10:51:00: IMPRESSION:  No change. Widespread  interstitial pulmonary density. This could be  a combination of edema,lymphangitic tumor and interstitial  pneumonia. Bilateral effusions right larger than left.      Electronically Signed    By: Nelson Chimes M.D.    On: 12/29/2013 11:03     Verified By: Jules Schick, M.D.,  Assessment and Plan: Impression:   Stage IV adenocarcinoma of the lung with bone metastasis, now with worsening shortness of breath and pain. Plan:   1.  Lung cancer: CT scan results from December 20, 2013 revealed possible continued progression of disease. Patient last received chemotherapy with cycle 2 of 81m/kg nivolumab on December 08, 2013. Her next scheduled dose was to be yesterday. Patient last received Zometa on November 17, 2013.  Will reschedule treatments once her acute symptoms have resolved.Pain: Continue current narcotic  regimen, patient does not wish to change at this time.Worsening shortness of breath: Unclear etiology. Possible pneumonitis, possible lymphangitic spread of malignancy. Case discussed with Dr. FOla Spurr We will initiate prednisone and consult pulmonary for consideration of bronchoscopy to further evaluate. consult, Will follow.   Advance Directive:  Advance Directive (Theatre stage manager no   Advance Directive Information Given patient refused   Electronic Signatures: FDelight Hoh(MD)  (Signed 30-Dec-15 14:55)  Authored: Note Type, CC/HPI, Review of Systems, ALLERGIES, Smoking Cessation, Patient Family Social History, HOME MEDICATIONS, Vital Signs, Physical Exam, Lab Results Review, Rad Results Review, Assessment and Plan, Advance Directive   Last Updated: 30-Dec-15 14:55 by FDelight Hoh(MD)

## 2014-04-24 NOTE — H&P (Signed)
PATIENT NAME:  [name], DICENSO MR#:  [id number] DATE OF BIRTH:  [birth year]  DATE OF ADMISSION:  06/12/2013  PRIMARY CARE PHYSICIAN:  Nonlocal.    REFERRING PHYSICIAN:  Dr. Owens Shark.  CHIEF COMPLAINT:  Persistent nausea, not feeling well, shortness of breath and cough.   HISTORY OF PRESENT ILLNESS:  The patient is a 54 year old Caucasian female with past medical history of metastatic lung cancer, recent history of pneumonia, recently admitted to the hospital for nausea, vomiting and diarrhea for three days, was just discharged home on last Tuesday, has been not feeling well since she was discharged.  The patient was persistently nauseated and had a few episodes of vomiting since she was discharged.  The patient is also feeling sick and became more short of breath and having persistent cough.  Her last chemotherapy was done on May 24th and the patient was supposed to get repeat chemotherapy yesterday which was rescheduled for next week and she was seen by her oncologist Dr. Grayland Ormond yesterday.  As the patient is feeling more and more sick she is brought into the ER.  The patient was complaining of some abdominal pain associated with nausea.  She had a CAT scan of the abdomen and pelvis done with contrast which has revealed no acute findings other than a large stone in the lower pole of the right kidney without any obstruction.  The patient's portable chest x-ray has revealed worsening of opacities when compared to the recent x-ray.  The patient was given IV Zosyn and vancomycin in the ED for healthcare-associated pneumonia as the patient was just recently admitted and discharged from the hospital and hospitalist team is called to admit the patient.  During my examination, the patient is very sleepy and tired as it is midnight.  The patient's sister lies at bedside.  History is obtained from the patient's sister-in-law and from old records as the patient is in deep sleep and tired to talk.  No other complaints.    PAST MEDICAL HISTORY:  Lung cancer with mets to liver and bone, recent history of pneumonia.   PAST SURGICAL HISTORY:  Port-A-Cath placement   ALLERGIES:  No known drug allergies.   PSYCHOSOCIAL HISTORY:  Lives at home, lives alone.  Mom and other family members live close by.  She used to smoke, but quit smoking in the year 1997.  No history of alcohol or illicit drug usage.   FAMILY HISTORY:  Mother had breast cancer and lung cancer.  Father had colon cancer.   REVIEW OF SYSTEMS: Unobtainable as the patient is tired and very sleepy.  She is arousable, but as she is feeling tired she has refused to answer the questions.   HOME MEDICATIONS:  Vitamin B12 500 mcg 1 tablet by mouth once daily, Tessalon Perles 1 capsule by mouth 3 times a day, promethazine 12.5 mg by mouth q. 6 hours, omeprazole 20 mg 1 capsule by mouth once a day, multivitamin 1 tablet by mouth once daily, MS Contin 1 tablet by mouth every 12 hours, Levaquin 500 mg 1 tablet by mouth every 24 hours, Dulcolax 1 capsule by mouth twice daily, Compazine 10 mg 1 tablet by mouth every six hours, Biotin 1000 mg 1 tablet by mouth once daily, Tylenol 2 tablets by mouth every four hours as needed.   PHYSICAL EXAMINATION: VITAL SIGNS:  Temperature 100.6, pulse 103, respirations 18, blood pressure 113/62, pulse ox is 97% on room air.  GENERAL APPEARANCE:  Not under acute distress.  Moderately built and  nourished. HEENT:  Normocephalic, atraumatic.  Pupils are equally reactive to light and accommodation.  No scleral icterus.  No conjunctival injection.  No sinus tenderness.  No postnasal drip.  Moist mucous membranes.  NECK:  Supple.  No JVD.  No thyromegaly.  Range of motion is intact.  LUNGS:  Moderate air entry.  Positive rales and rhonchi.  No wheezing.  CARDIOVASCULAR:  S1, S2, normal, regular rate and rhythm, tachycardic.  GASTROINTESTINAL:  Soft.  Bowel sounds are positive in all four quadrants.  Minimal epigastric tenderness is  present.  No rebound tenderness.  No masses felt.  NEUROLOGIC:  Arousable, but very lethargic and sleepy as she is tired and did not sleep well for the past 2 to 3 days.  Reflexes are 2+.  Sensory is intact.   MOTOR:  The patient being lethargic, not cooperative and very sleepy during my examination, but arousable.  Sister-in-law is reporting that the patient is very tired for the past 2 to 3 days as she was not sleeping well because of her sickness.  EXTREMITIES:  No edema.  No cyanosis.  No clubbing.  PSYCHIATRIC:  Mood and affect could not be elicited as the patient is very sleepy.   LABORATORY DATA AND IMAGING STUDIES:  Portable chest x-ray, stable to slightly worsened irregular opacities within the right perihilar region and right upper lobe as compared to recent radiograph from June 7th.  These findings may reflect residual tumor or post treatment changes, possibly superimposed infection.  Pneumonia could be considered given the relatively increased opacity as compared to the recent radiograph.  CAT scan of the abdomen and pelvis with contrast has revealed small right pleural effusion and a pericardial effusion similar to previous study.  Diffuse fatty infiltration of the liver.  Large stone in the lower pole of the right kidney without obstruction.  Focal scarring in both kidneys.  No acute inflammatory process with probably metastatic lesion demonstrated in the right proximal femur.  A 12 lead EKG:  Sinus bradycardia at 112 beats per minute, incomplete right bundle-branch block.  LFTs:  Total protein 6.3, albumin 3.2, bilirubin total is normal.  Alkaline phosphatase and AST are normal.  ALT at 8.  Troponin less than 0.02.  WBC 3.2, hemoglobin 9.0, hematocrit 26.4, platelets are 70, MCV 99.  Urinalysis:  Straw-colored, clear in appearance, 1+ ketones are present, blood is negative, nitrites and leukocyte esterase are negative.  Mucus is present.  Chem-8 is normal except BUN at 6.   ASSESSMENT AND  PLAN:  A 54 year old Caucasian female presenting to the Emergency Room with a chief complaint of nausea, diffuse abdominal pain and feeling sick with worsening of the cough for the past few days will be admitted with the following assessment and plan.  1.  Healthcare-associated pneumonia.  The patient will be given Zosyn and levofloxacin.  2.  Nausea, probably chemotherapy related.  We will provide her antinausea medication and if necessary intravenous fluids, if the patient persistently vomits.  3.  Metastatic lung cancer with metastasis to liver and femur.  The patient is following with Dr. Grayland Ormond as an outpatient.  Follow up with Dr. Grayland Ormond as scheduled next Thursday, on chemotherapy.  4.  Pancytopenia, probably secondary to chemotherapy.  The patient is thrombocytopenic, but no bleeding or bruising noted.  We will continue close monitoring of her blood counts.   5.  We will provide her gastrointestinal prophylaxis and deep vein thrombosis prophylaxis with sequential compression devices.  6.  CODE STATUS:  SHE IS FULL CODE.  Daughter is the medical power of attorney.    Diagnosis and plan of care was discussed in detail with the patient's sister-in-law at bedside.  She verbalized understanding of the plan.   Total time spent on admission is 50 minutes.    ____________________________ Nicholes Mango, MD ag:ea D: 06/12/2013 02:00:29 ET T: 06/12/2013 03:27:10 ET JOB#: [id number]  cc: Nicholes Mango, MD, <Dictator> Kathlene November. Grayland Ormond, MD Nicholes Mango MD ELECTRONICALLY SIGNED 06/21/2013 0:56

## 2014-04-24 NOTE — Discharge Summary (Signed)
PATIENT NAME:  [name], SNOOKS MR#:  [id number] DATE OF BIRTH:  1960/02/22  DATE OF ADMISSION:  12/20/2013 DATE OF DISCHARGE:  12/23/2013  For a detailed note, please look at the history and physical done on admission by Dr. Remigio Eisenmenger.   DIAGNOSES AT DISCHARGE:  Pneumonia, likely hospital acquired pneumonia/nosocomial pneumonia, history of lung cancer,  Hypertension, chronic pain syndrome, gastroesophageal reflux disease, and neuropathy.   DIET: The patient is being discharged on a regular diet.   ACTIVITY: As tolerated.   FOLLOWUP INSTRUCTIONS:  Follow-up Dr. Grayland Ormond in the next 1-2 weeks.   DISCHARGE MEDICATIONS: Compazine 10 mg q. 6 hours as needed, Zofran 8 mg q. 8 hours as needed, granisetron 1 mg q. 12 hours, omeprazole 20 mg daily, dronabinol 5 mg b.i.d., lidocaine topical to be applied daily, Senokot 1 tab b.i.d., hydrocodone oral suspension 5 mL b.i.d. as needed for cough, sucralfate 10 mL q.i.d., fentanyl patch 75 mcg q. 72 hours, Dilaudid 2 mg q. 4 hours as needed, gabapentin 300 mg t.i.d., and Augmentin 500/125 1 tab b.i.d. x 10 days.   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 A CT scan of the chest done with contrast showing no evidence of pulmonary embolism, new pleural effusions bilaterally. The mass in the right lower lobe appears increased in size compared to recent prior study. New area of probable pneumonia in the posterior segment of the right upper lobe, given the rapid emergence of this finding compared to the recent prior study. Probable lymphangitic spread of the tumor diffusely.   HOSPITAL COURSE: This is a 54 year old female with medical problems as mentioned above, presented to the hospital with weakness, shortness of breath, and suspected to have pneumonia.   1. Pneumonia. This is likely nosocomial  pneumonia and also postobstructive pneumonia, given her history of  right-sided lung cancer. The patient was admitted to the hospital, started on broad-spectrum IV antibiotics with vancomycin, Zosyn, and Zithromax. After being on IV antibiotic therapy about 2-3 days, her clinical symptoms have improved. Her blood cultures are negative. She has remained afebrile and hemodynamically stable. She is currently being discharged on oral Augmentin for another 10 days.  2.  History of lung cancer. Her CT scan did show some progressive disease. She was seen by Dr. Grayland Ormond. He did not want to start any chemotherapy given her acute respiratory illness with pneumonia. She will continue follow-up with Dr. Grayland Ormond as an outpatient.  3.  Chronic pain. This is secondary to her cancer. The patient was maintained on a fentanyl patch and Dilaudid. She will continue that.  4.  Neuropathy. The patient was maintained on Neurontin. She will resume that.  5.  Hypokalemia. This has been supplemented and since then, has resolved.   CODE STATUS:  The patient is a full code.   TIME SPENT: 40 minutes.   ____________________________ Belia Heman. Verdell Carmine, MD vjs:mw D: 12/23/2013 16:22:48 ET T: 12/23/2013 16:45:52 ET JOB#: [id number]  cc: Belia Heman. Verdell Carmine, MD, <Dictator> Kathlene November. Grayland Ormond, MD Henreitta Leber MD ELECTRONICALLY SIGNED 12/31/2013 10:47

## 2014-04-24 NOTE — H&P (Signed)
PATIENT NAME:  [name], [name] MR#:  [id number] DATE OF BIRTH:  1960-07-08  DATE OF ADMISSION:  07/11/2013  PRIMARY CARE PHYSICIAN:  None.   PRIMARY ONCOLOGIST: Dr. Grayland Ormond.   CHIEF COMPLAINT: Right-sided back pain along with nausea or vomiting. The patient is status post chemotherapy July 7.   HISTORY OF PRESENT ILLNESS:  [name] is a 54 year old Caucasian female with a past medical history of right-sided lung cancer with metastasis to the ribs with bony metastasis, who recently was treated for post-obstructive pneumonia.  On June 9, she was discharged from the hospital.  She came back on June 12 and got admitted for nausea and vomiting, status post chemotherapy.  She was discharged June 18, comes back today after she got another chemotherapy July 07. She started having nausea and vomiting last night along with some right-sided back pain. The patient reports no fever. She denies any cough with phlegm. She has some  mild shortness of breath. She is having constant nausea. The patient was started on IV fluids. She underwent CT scan of the chest, which shows improvement in her overall CT appearance of the lungs, along with improved aeration in the right upper lobe and middle lobe post-obstructive  pneumonia. She has some atelectasis.  No PE was noted. She was empirically started on antibiotics with vancomycin, cefepime, and Levaquin, which was given in the Emergency Room. Her temperature was 99. She was mildly tachycardic. She was tachycardic at 120's. The patient is being admitted for further evaluation and management secondary to thirst, possibly due to nausea, vomiting, and right back pain. I doubt the patient has new infection at this point; however, we will continue antibiotics for now.   PAST MEDICAL HISTORY:  1.  Right lung cancer undergoing chemotherapy at the [facility].  2.  History of tobacco abuse in the past. 3.  Persistent nausea.  4.  Chronic back pain, suspected due to bony  metastasis.   ALLERGIES: NO KNOWN DRUG ALLERGIES.   MEDICATIONS:  1.  Acetaminophen/oxycodone 325/5 two tablets every 4 hours as needed.  2.  Biotin 1000 mcg p.o. daily.  3.  Compazine 10 mg 1 every 6 hours as needed.  4.  Dronabinol 5 mg 1 capsule b.i.d.  5.  Dulcolax 1 capsule b.i.d.  6.  Granisetron 1 mg b.i.d.  7.  Megestrol 40 mg/mL 10 mL b.i.d.  8.  Morphine extended-release 30 mg 1 tablet b.i.d. every 12 hours as needed.  9.  MS Contin 30 mg 1 tablet b.i.d. scheduled.  10.  Multivitamin daily.  11.  Omeprazole 20 mg daily.  12.  Promethazine 12.5 mg 1 tablet every 6 hours as needed.  13.  Vitamin B12 500 mg p.o. daily.   SOCIAL HISTORY: She lives at home, ex-smoker, denies any drug use.   PAST SURGICAL HISTORY: Port-A-Cath placement, right chest.   FAMILY HISTORY: Mother had breast cancer and lung cancer. Father had colon cancer.   REVIEW OF SYSTEMS:  CONSTITUTIONAL: Positive for fatigue, weakness. No fever.  EYES: No blurred or double vision, cataracts or glaucoma.  EAR, NOSE, AND THROAT: No tinnitus, ear pain, hearing loss or postnasal drip.  RESPIRATORY: No cough, wheeze, hemoptysis. Positive for shortness of breath.  Positive for  painful respiration and right back pain.  CARDIOVASCULAR: No chest pain. No edema.  Positive for dyspnea on exertion.  GASTROINTESTINAL: Positive for nausea, vomiting, and abdominal pain. No GERD.  No rectal bleed.  GENITOURINARY: No dysuria, hematuria, frequency.  ENDOCRINE: No polyuria, nocturia,  or thyroid problems.  HEMATOLOGY: No anemia or easy bruising or bleeding.  SKIN: No acne, rash or lesion.  MUSCULOSKELETAL: Positive for right back pain. No arthritis or cramps or swelling.  NEUROLOGIC: No CVA, TIA, ataxia, dysarthria.  PSYCHIATRIC: No anxiety, depression, or bipolar disorder.  All other systems reviewed and negative.   PHYSICAL EXAMINATION:  GENERAL: The patient is awake, alert, oriented x 3. Mild to moderate distress due to  nausea and vomiting.  VITAL SIGNS: Temperature 97.5. Pulse is 99, blood pressure is 122/80, saturations 100% on room air.  HEENT: Atraumatic, normocephalic. Pupils: PERRLA. EOM intact. Oral mucosa is dry.  NECK: Supple. No JVD. No carotid bruit.  RESPIRATORY: Decreased breath sounds in the bases. No audible rales, rhonchi, respiratory distress or use of accessory muscles.  CARDIOVASCULAR: Tachycardia present. No murmur heard. PMI not lateralized. Chest nontender.  EXTREMITIES: Good pedal pulses, good femoral pulses. No lower extremity edema.  ABDOMEN: Soft, benign, nontender. No organomegaly.  Mild epigastric discomfort. Good bowel sounds.  NEUROLOGIC: Grossly intact cranial nerves II-XII. No motor or sensory deficit.  SKIN: Warm and dry. The patient has a right Port-A-Cath present on the right upper chest. Site looks okay.  PSYCHIATRIC:  Patient is awake, alert, oriented x 3.   IMAGING:  CT angiography of the chest shows no PE. Improved aeration in the right upper and middle lobe suggest in total response with treatment of post obstructive atelectasis.  Residual nodular heterogenous air space opacities within the right and lower lobes with suspected lymphangitic tumor within the right upper lobe worrisome for residual disease.  Evolving postsurgical change of the right hemithorax vesicle of open right upper lobe biopsy. Known metastasis involving the anterior aspect of the left fifth rib suggest treatment response.   BNP is 105, glucose is 100, BUN is 13, creatinine is 0.63, sodium is 134, potassium 3.9, chloride is 104, bicarbonate is 17. LFTs within normal limits. White count is 14.7, hemoglobin and hematocrit is 14.8 and 44.6.   ASSESSMENT: A 54 year old [name] with history of right upper lobe lung cancer along with bony metastasis comes in who had recent chemotherapy on July 7, comes in with:   1.  Systemic inflammatory response syndrome associated secondary to nausea, vomiting and right  back pain. The patient does have bony metastasis, likely pain due to her bony metastasis. Her CT chest was negative for PE.  In fact, shows improvement in her post-obstructive atelectasis. We will admit the patient to oncology floor. Continue IV fluids. Monitor fever curve, blood cultures, and sputum cultures, if the patient is able to give any. We will continue IV pain medications along with IM, p.o., and IV antiemetics.  2.  Nausea and vomiting likely due to chemotherapy. She is already on anti-nausea medication. Continue IV fluids and start clear liquids.   3.  Elevated white count appears reactive, likely due to her nausea or vomiting. The patient did have a low-grade temperature of 99 and was tachycardic in the Emergency Room.  She was started on IV broad-spectrum antibiotics with cefepime and Levaquin. I will continue that; however, if the patient remains afebrile and continues to improve, consider discontinuing antibiotics because this likely is not any new infection.   4.  Right back pain appears likely due to bony metastasis. Will continue IV Dilaudid and p.o. morphine as the patient tolerates.   5.  Gastrointestinal prophylaxis, IV ranitidine.   6.  Deep vein thrombosis prophylaxis. Subcutaneous heparin.   7.  CODE STATUS: FULL CODE.  Above was discussed with the patient and the patient's parents who were present in the Emergency Room.   TIME SPENT: 50 minutes.     ____________________________ Hart Rochester Posey Pronto, MD sap:ts D: 07/11/2013 11:50:04 ET T: 07/11/2013 13:00:00 ET JOB#: [id number]  cc: Payten Hobin A. Posey Pronto, MD, <Dictator> Ilda Basset MD ELECTRONICALLY SIGNED 07/13/2013 15:59

## 2014-04-24 NOTE — Consult Note (Signed)
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 EGFR and ALK negative, now with pneumonia.  HPI:   Patient admitted to the hospital with low-grade fevers, difficulty breathing, and increased nausea. Subsequent workup revealed likely pneumonia. Currently, patient still feels short of breath but improved since admission. She continues to feel weak and fatigued. She continues to have a poor appetite and nausea. She does not complain of dizziness or other neurologic complaints today.  She denies any recent fevers. She has no urinary complaints. Patient offers no further specific complaints today.     Review of Systems:  Performance Status (ECOG): 2  Psych: no complaints  Pain ?: Yes  Pain- Qualitative: Moderate  Pain- Plan: Narcotic analgesic ordered  Discussed with patient: Prohylactic stimulant laxative  Stool softner  Dietary fiber  Fluids  Pain Effectiveness: Pain relief obtained  Emotional well-being: None  Review of Systems: All other systems were reviewed and found to be negative  Review of Systems:   As per HPI. Otherwise, 10 point system review was negative.   Allergies:  No Known Allergies:   Smoking History: Smoking History Quit 1998, 20 year history.  PFSH: Additional Past Medical and Surgical History: negative.    Family history: Mother with lung cancer, father with prostate cancer.    Social history: Tobacco as above, occasional alcohol.   Home Medications: Medication Instructions Last Modified Date/Time  gabapentin 300 mg oral capsule 1 cap(s) orally 3 times a day 20-Dec-15 12:16  HYDROmorphone 2 mg oral tablet 1 tab(s) orally every 4 hours, As Needed, pain 20-Dec-15 12:16  fentaNYL 75 mcg/hr transdermal film, extended release 1 patch transdermal every 72 hours 20-Dec-15 12:16  predniSONE 5 mg oral tablet 4 tabs day1, 3 tabs day2, 2 tabs day3,4, 1 tab day5,6 20-Dec-15 12:16   chlorpheniramine-HYDROcodone 8 mg-10 mg/5 mL oral suspension, extended release 5 milliliter(s) orally every 12 hours, As needed, cough 20-Dec-15 12:16  sucralfate 1 g/10 mL oral suspension 10 milliliter(s) orally 4 times a day (with meals and at bedtime) x 30 days 20-Dec-15 12:16  lidocaine 5% topical film Apply topically to affected area once a day 20-Dec-15 12:16  dronabinol 5 mg oral capsule 1 cap(s) orally 2 times a day 20-Dec-15 12:16  omeprazole 20 mg oral delayed release capsule 1 cap(s) orally once a day 20-Dec-15 12:16  granisetron 1 mg oral tablet 1 tab(s) orally every 12 hours 20-Dec-15 12:16  Compazine tablet 10 mg 1 tab(s) orally every 6 hours x 30 days as needed  for nausea. 20-Dec-15 12:16  Zofran 8 mg oral tablet 1 tab(s) orally every 8 hours, As Needed - for Nausea, Vomiting 20-Dec-15 12:16  Senna 8.6 mg oral tablet 1 tab(s) orally 2 times a day 20-Dec-15 12:16   Vital Signs:  :: vital signs stable, patient afebrile.   Physical Exam:  General: well developed, well nourished, and in no acute distress  Mental Status: normal affect  Eyes: anicteric sclera  Respiratory: clear to auscultation bilaterally  Cardiovascular: regular rate and rhythm, no murmur, rub, or gallop  Gastrointestinal: soft, nondistended, nontender, no organomegaly.  normal active bowel sounds  Musculoskeletal: No edema  Skin: No rash or petechiae noted  Neurological: alert, answering all questions appropriately.  Cranial nerves grossly intact   Laboratory Results:  Routine Chem:  21-Dec-15 06:27   Potassium, Serum  2.9  Glucose, Serum 99  BUN  4  Creatinine (comp) 0.64  Sodium, Serum 143  Chloride,  Serum 107  CO2, Serum 30  Calcium (Total), Serum  7.2  Anion Gap  6  Osmolality (calc) 282  eGFR (African American) >60  eGFR (Non-African American) >60 (eGFR values <5m/min/1.73 m2 may be an indication of chronic kidney disease (CKD). Calculated eGFR, using the MRDR Study equation, is useful in   patients with stable renal function. The eGFR calculation will not be reliable in acutely ill patients when serum creatinine is changing rapidly. It is not useful in patients on dialysis. The eGFR calculation may not be applicable to patients at the low and high extremes of body sizes, pregnant women, and vegetarians.)  Routine Hem:  21-Dec-15 06:27   WBC (CBC)  2.3  RBC (CBC)  3.61  Hemoglobin (CBC)  10.9  Hematocrit (CBC)  33.6  Platelet Count (CBC)  139  MCV 93  MCH 30.2  MCHC 32.4  RDW  17.3  Neutrophil % 66.5  Lymphocyte % 21.1  Monocyte % 8.8  Eosinophil % 2.9  Basophil % 0.7  Neutrophil # 1.5  Lymphocyte #  0.5  Monocyte # 0.2  Eosinophil # 0.1  Basophil # 0.0 (Result(s) reported on 21 Dec 2013 at 06:48AM.)   Review Pathology Report:  Medical Imaging Results:   Review Medical Imaging   PA and Lateral 20-Dec-2013 11:54:00: IMPRESSION:  Re- demonstrated heterogeneous right mid and upper lung opacities  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    Osseous metastatic disease.      Electronically Signed    By: DLovey NewcomerM.D.    On: 12/20/2013 12:47         Verified By: DIlsa Iha M.D., ANGIOGRAPHY Chest with for PE 20-Dec-2013 14:12:00: IMPRESSION:  No demonstrable pulmonary embolus.  New pleural effusions bilaterally.    The mass in the right lower lobe appears increased in size compared  to recent prior study. New area of probable pneumonia in the  posterior segment of the right upper lobegiven the rapid emergence  of this finding compared to recent prior study.    Probable lymphangitis spread of tumor diffusely. Areas of  bronchiectatic change bilaterally. Question peripheral mucous  plugging in the right upper lobe.    Evidence of sclerotic metastasis in the anterior left fifth rib. No  appreciable adenopathy. Minimal pericardial effusion present.    Hepatic steatosis.  Electronically Signed    By: WLowella GripM.D.    On: 12/20/2013 14:56          Verified By: WLeafy Kindle WOODRUFF, M.D., Color Flow Doppler Low Extrem Left (Leg) 20-Dec-2013 16:39:00: IMPRESSION:  No evidence of deep venous thrombosis.      Electronically Signed    By: DLovey NewcomerM.D.    On: 12/20/2013 16:59     Verified By: DIlsa Iha M.D.,  Assessment and Plan: Impression:   Stage IV adenocarcinoma of the lung with bone metastasis.  EGFR and ALK negative, likely postobstructive pneumonia. Plan:   1.  Lung cancer: CT scan results from as above with possible continued progression of disease. Patient last received chemotherapy with cycle 2 of 371mkg nivolumab on December 08, 2013. Her next scheduled dose was to be today. Patient last received Zometa on November 17, 2013.  She has been instructed to return to clinic on December 29, 2013 for reconsideration of her next infusion of chemotherapy. Patient expressed understanding and was in agreement to this plan.Pain: Continue current narcotic regimen, patient does not wish to change at this time.Pneumonia: Continue current antibiotics.  consult, call with questions.    Fax to Physician (Removed):  Advance Directive:  Advance Directive Theatre stage manager) no   Advance Directive Information Given patient refused   Electronic Signatures: Delight Hoh (MD)  (Signed 22-Dec-15 15:19)  Authored: Note Type, History of Present Illness, CC/HPI, Review of Systems, ALLERGIES, Smoking Cessation, Patient Family Social History, HOME MEDICATIONS, Vital Signs, Physical Exam, Lab Results Review, Pathology Report Review, Rad Results Review, Assessment and Plan, Fax to Physician, Advance Directive   Last Updated: 22-Dec-15 15:19 by Delight Hoh (MD)

## 2014-04-24 NOTE — H&P (Signed)
PATIENT NAME:  [name], [name] MR#:  [id number] DATE OF BIRTH:  Mar 07, 1960  DATE OF ADMISSION:  12/20/2013  PRIMARY CARE PROVIDER: Oncology, Kathlene November. Grayland Ormond, MD.   CHIEF COMPLAINT: Shortness of breath, cough, weakness.   HISTORY OF PRESENT ILLNESS: A 54 year old, Caucasian female patient with a history of stage IV right-sided lung adenocarcinoma with bone metastasis on chemotherapy, chronic pain syndrome, GERD, chronic nausea and vomiting, presents to the emergency room complaining of worsening shortness of breath, cough with green productive sputum, and severe weakness. The patient is presently has ongoing chemotherapy with the last one 2 weeks back and the next one scheduled for 12/22/2013. Per patient, her cancer seems to be worsening on the right side, but she is hoping for the best and follows with Dr. Grayland Ormond. Here, the patient has been found to be tachycardic and has acute respiratory failure with saturations going into the 70s on room air, needing 3 to 4 liters of oxygen. CT scan of the chest showing worsening right-sided lung cancer along with pneumonia and she is being admitted for healthcare-acquired pneumonia with acute respiratory failure. The patient used antibiotics about 2 months back for a UTI the last time.   PAST MEDICAL HISTORY: 1. History of stage IV right-sided lung adenocarcinoma with bone metastasis.  2. GERD.  3. Esophagitis.  4. Chronic nausea and vomiting.  5. History of pericardial tamponade.  6. Remote tobacco abuse.   SOCIAL HISTORY: The patient used to smoke in the past, quit 18 years back. Occasional alcohol use. Ambulates on her own at baseline.   CODE STATUS: Full code.   FAMILY HISTORY: Positive for lung cancer in her mother.   ALLERGIES: No drug allergies.   REVIEW OF SYSTEMS: CONSTITUTIONAL: Complaints of fatigue, weakness.  EYES: No blurred vision, pain or redness.   ENT: No tinnitus, ear pain or hearing loss.   RESPIRATORY: Has cough, shortness  of breath.   CARDIOVASCULAR: Has chronic chest pain.   GASTROINESTINAL: Has chronic nausea and vomiting. No abdominal pain.   GENITOURINARY: No dysuria, hematuria or frequency.   ENDOCRINE: No polyuria, nocturia, thyroid problems.   HEMATOLOGIC AND LYMPHATIC: No anemia, easy bruising or bleeding.   INTEGUMENTARY: No acne, rash, lesion.   MUSCULOSKELETAL: Has chronic bone pain from metastasis. No focal numbness or weakness. Has generalized weakness.   PSYCHIATRIC: No anxiety or depression.   HOME MEDICATIONS: 1. Chlorpheniramine hydrocodone on 5 mL extended release every 12 hours as needed for cough.  2. Compazine 10 mg every 6 hours.  3. Dronabinol 5 mg 2 times a day.  4. Fentanyl 75 mcg patch every 3 days. 5. Gabapentin 300 mg 3  times a day. 6. Granisetron 1 mg oral every 12 hours.  7. Hydromorphone 2 mg every 4 hours as needed for pain.  8. Lidocaine 5% topical daily.  9. Omeprazole 20 mg daily.  10. Senna 8.6 mg 2 times a day.  11. Sucralfate 1 gram 4 times a day.   PHYSICAL EXAMINATION: VITAL SIGNS: Shows temperature 98.1, pulse of 122, respirations 20, blood pressure 115/86, saturating 75% on room air and 98% on 3 liters of oxygen.  GENERAL: Moderately built, Caucasian female patient lying in bed in mild respiratory distress.   PSYCHIATRIC: Alert and oriented x 3, pleasant.  HEENT: Atraumatic, normocephalic. Oral mucosa dry and pink. External ears normal. No pallor. No icterus. Pupils bilaterally equal and reactive to light.   NECK: Supple. No thyromegaly. No palpable lymph nodes. Trachea midline. No carotid bruit or JVD.  CARDIOVASCULAR: S1, S2, without any murmurs. Peripheral pulses 2+. No edema, tachycardia.   RESPIRATORY: Has bilateral crackles, more so on the right side.   GASTROINTESTINAL: Soft abdomen, nontender. Bowel sounds present. No organomegaly palpable.   SKIN: Warm and dry. No petechiae, rash, ulcers.   MUSCULOSKELETAL: No joint swelling,  redness, effusion of the large joints. Normal muscle tone.   NEUROLOGIC: Motor strength 5/5 in upper and lower extremities. Sensation is intact all over.  LYMPHATIC: No cervical lymphadenopathy.   EXTREMITIES: Left lower extremity larger than right lower extremity.   LABORATORY STUDIES: Glucose 91, BUN 8, creatinine 7.63, sodium 141, potassium 3.5, chloride 106. AST, ALT, alkaline phosphatase, bilirubin normal. WBC 4, hemoglobin 12.1, platelets 163,000. Troponin less than 0.02. Urinalysis with 15 WBCS, no bacteria. Lactic acid 0.7.    DIAGNOSTIC STUDIES: CT scan of the chest shows right upper lobe infiltrates along with worsening renal carcinoma and bone metastasis.   ASSESSMENT AND PLAN: 1. Right upper lobe healthcare-acquired pneumonia and possibly postobstructive pneumonia from the cancer. The patient will be started on broad-spectrum antibiotics with vancomycin, Zosyn and azithromycin. Send for sputum cultures. The patient has no sepsis, but has acute respiratory failure for which she will be increased on oxygen. Nebulizers scheduled. No wheezing. Antibiotics can be tapered depending on the culture results.  2. Stage IV adenocarcinoma. The patient has scheduled chemotherapy on 12/22/2013. This needs to be postponed if she continues to be in the hospital.  3. Chronic esophagitis and vomiting. Continue her nausea medications from home, add IV p.r.n. medications.  4. Deep vein thrombosis prophylaxis with Lovenox.   CODE STATUS: Full code.   TIME SPENT TODAY ON THIS CASE: 40 minutes     ____________________________ Leia Alf. Irasema Chalk, MD srs:TT D: 12/20/2013 16:22:06 ET T: 12/20/2013 16:50:52 ET JOB#: [id number]  cc: Alveta Heimlich R. Danah Reinecke, MD, <Dictator> Kathlene November. Grayland Ormond, MD Neita Carp MD ELECTRONICALLY SIGNED 12/22/2013 20:50

## 2014-04-24 NOTE — H&P (Signed)
PATIENT NAME:  [name], [name] MR#:  [id number] DATE OF BIRTH:  1960-06-13  DATE OF ADMISSION:  10/21/2013  REFERRING PHYSICIAN: Ahmed Prima, MD  PRIMARY CARE PHYSICIAN: Nonlocal.  Follows with oncologist, Kathlene November. Grayland Ormond, MD.   CHIEF COMPLAINT: Nausea, vomiting.   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 Once again, she does follow up with Dr. Grayland Ormond of oncology. She has finished 6 cycles of carboplatin and Taxol as well as Avastin and is now on maintenance therapy with Avastin. Last chemotherapy  received yesterday. Since that time period had persistent nausea and vomiting. Describes vomiting as nonbloody, nonbilious emesis without diarrhea.  Her last successful p.o. intake was about 2 days ago. She is now complaining of generalized weakness and fatigue. Denies any fevers, chills, further symptomatology at this time.   REVIEW OF SYSTEMS:   CONSTITUTIONAL: Positive for fatigue, weakness. Denies any fevers, chills.  EYES: Denies blurry vision, double vision or eye pain.  EARS, NOSE, AND THROAT: Denies tinnitus, ear pain, hearing loss.  RESPIRATORY:  Denies cough, wheeze, shortness of breath.    CARDIOVASCULAR: Denies chest pain, palpitations, edema.  GASTROINTESTINAL: Positive for nausea and vomiting as described above. Denies any diarrhea.   GENITOURINARY: Denies dysuria or hematuria.   ENDOCRINE: Denies nocturia or thyroid problems. HEMATOLOGIC AND LYMPHATICS:  Denies easy bruising or bleeding.  SKIN: Denies rash or lesions.  MUSCULOSKELETAL: Denies pain in neck, back, shoulder, knees, hips or arthritic symptoms at this time.  NEUROLOGIC: Denies paralysis or paresthesias.  PSYCHIATRIC: Denies anxiety or depressive symptoms.  Otherwise full review of systems performed by me is negative.   PAST MEDICAL HISTORY: Stage IV adenocarcinoma of the  lung with known metastasis to the bone as well as gastroesophageal reflux disease.   SOCIAL HISTORY: Positive for remote tobacco use as well as occasional alcohol use. Denies any drug use.   FAMILY HISTORY: Positive for lung cancer in her mother.   ALLERGIES: No known drug allergies.   HOME MEDICATIONS: Include fentanyl 75 mcg transdermal patch change every 72 hours, hydromorphone 2 mg p.o. q. 4 hours as needed for pain. Compazine 10 mg p.o. q. 6 hours as needed for nausea, dronabinol 5 mg p.o. b.i.d.  Granisetron 1 mg p.o. b.i.d. Zofran 8 mg p.o. q. 8 hours as needed for nausea and vomiting, lidocaine 5% topical to affected area once daily, senna  8.6 mg p.o. b.i.d., Prilosec 20 mg p.o. daily, multivitamin 1 tab p.o. daily, vitamin B12 500 mcg p.o. daily.   PHYSICAL EXAMINATION:  VITAL SIGNS: Temperature 98.9, heart rate 87, respirations of 18, blood pressure 127/97, saturating 97% on room air.  GENERAL: Chronically ill-appearing Caucasian female currently in no acute distress.  HEAD: Normocephalic, atraumatic.  EYES: Pupils equal, round, react to light. Extraocular muscles intact. No scleral icterus.  MOUTH: Dry mucosal membrane. Dentition intact. No abscess noted.  EAR, NOSE, THROAT: Clear without exudates. No external lesions.  NECK: Supple. No thyromegaly. No nodules. No JVD.  PULMONARY: Clear to auscultation bilaterally without wheezes, rubs or rhonchi. No use of accessory muscles. Good respiratory effort.  CHEST: Nontender to palpation. Has a port located over the right chest, which is currently accessed without surrounding erythema or edema.   CARDIOVASCULAR: S1 and S2, regular rate and rhythm.  No murmurs, rubs or gallops. No edema. Pedal pulses 2+ bilaterally. GASTROINTESTINAL: Soft, nontender, nondistended. No masses.  Positive bowel sounds.  Hyperactive bowel sounds.   MUSCULOSKELETAL: No swelling, clubbing, or edema. Range of motion full in all extremities.  NEUROLOGIC: Cranial  nerves II-XII intact. No gross focal neurological deficits. Sensation intact. Reflexes intact.  SKIN: No ulceration, lesions, rash, cyanosis. Skin warm and dry. Turgor intact.  PSYCHIATRIC: Mood and affect within normal limits. The patient is awake, alert and oriented x 3. Insight and judgment are intact.    LABORATORY DATA: Sodium 141, potassium 3, chloride 108, bicarb of 24, BUN of 5, creatinine 0.48, glucose 108. LFTs: Total protein of 6.1 albumin of 3, otherwise, within normal limits. WBC of 3.1, hemoglobin of 11.5, platelets of 198. Chest x-ray performed revealing slight volume loss as well as right upper lobe density.   ASSESSMENT AND PLAN: A 54 year old Caucasian female with history of stage IV adenocarcinoma of the lung with known metastases to bone presenting with nausea and vomiting after chemotherapy.  1.  Intractable nausea, vomiting:  Received Zofran as well as Reglan in the Emergency Department. Still has persistent nausea and vomiting likely secondary to her chemotherapy. Provide IV fluid hydration as well as Zofran  IV p.r.n. and advance her diet as tolerated.  2.  Hypokalemia. Replace with goal potassium of 4 to 5.  3.  Stage IV adenocarcinoma to the lung with known bone metastases. Change her pain medications to IV for the time being as well as continue with the bowel regimen.  4.  Venous thromboembolism prophylaxis: Heparin subcutaneous. We will followup platelet count.  If drops below 50 we will hold heparin at that time.   CODE STATUS: The patient is full code.   TIME SPENT: 45 minutes.   ____________________________ Aaron Mose. Rakeya Glab, MD dkh:AT D: 10/21/2013 22:33:34 ET T: 10/21/2013 23:11:14 ET JOB#: [id number]  cc: Aaron Mose. Sani Loiseau, MD, <Dictator> Donyell Ding Woodfin Ganja MD ELECTRONICALLY SIGNED 10/22/2013 20:27

## 2014-04-24 NOTE — Consult Note (Signed)
EGD for nausea, vomiting, abd pain shows normal stomach except small hiatal hernia with esophagitis likely from vomiting.  Duodenal normal also.  Will start clear liquids and carafate slurry  and continue PPI bid.    Electronic Signatures: Manya Silvas (MD)  (Signed on 27-Oct-15 13:01)  Authored  Last Updated: 27-Oct-15 13:01 by Manya Silvas (MD)

## 2014-04-24 NOTE — Consult Note (Signed)
Reason for Visit: This 54 year old Female patient presents to the clinic for initial evaluation of  stage IV lung cancer .   Referred by Dr. Grayland Ormond.  Diagnosis:  Chief Complaint/Diagnosis   patient is a 54 year old female with known stage IV adenocarcinoma of the right lung with bone metastasis for palliative radiation therapy to chest and right hip  Pathology Report pathology report reviewed   Imaging Report PET CT scan reviewed   Referral Report clinical notes reviewed   Planned Treatment Regimen palliative radiationtherapy to chest and right hip   HPI   patient is a 54 year old femalewho presents with increasing shortness of breath and cough found to have large right lung mass. Workup including PET CT scan show multiple lytic lesions in the vertebral bodies as well as right proximal femur. She was biopsied and positive for adenocarcinoma.she was seen by Dr. Grayland Ormond and started on chemotherapy. Her tumor was EGFR and ALK-negative. She's been started on cycle 1 of carboplatin and Taxol. She did feel nauseous several days after that although has recovered. Her case was presented her weekly tumor conference and recommendation was made for some pallor radiation therapy to her chest since her lung mass is largeabutting the SVC and probable lead to further atelectasis and possible hemoptysis. I am also contemplating palliative radiation therapy to her right hip based on her pain in that region and PET scan positivity in the proximal femoral shaft.  Past Hx:    shortness of breath:    lung cancer:    denies:    bladder surgery age 54:   Past, Family and Social History:  Past Medical History positive   Respiratory shortness of breath   Genitourinary bladder surgery at the time age of 55   Family History positive   Family History Comments mother with lung cancer father with prostate cancer   Social History positive   Social History Comments greater than 20-pack-year smoking  history no EtOH abuse history   Additional Past Medical and Surgical History patient seen accompanied by family member today   Allergies:   No Known Allergies:   Home Meds:  Home Medications: Medication Instructions Status  Zofran 8 mg oral tablet 1 tab(s) orally every 8 hours as needed for nausea Active  chlorpheniramine-HYDROcodone 8 mg-10 mg/5 mL oral suspension, extended release 5 milliliter(s) orally every 12 hours, As Needed Active  zolpidem 5 mg oral tablet 1 tab(s) orally once a day (at bedtime) Active  Tessalon Perles 100 mg capsule 1 cap(s) orally 3 times a day x 10 days as needed for cough Active  MS Contin 15 mg/12 hr tablet, extended release 1 tab(s) orally every 12 hours x 30 days. Active  acetaminophen-oxyCODONE 325 mg-5 mg oral tablet 2 tab(s) orally every 4 hours, As Needed Active  promethazine 12.5 mg oral tablet 1 tab(s) orally every 6 hours, As Needed - for Nausea, Vomiting Active  multivitamin 1   once a day Active  omeprazole 20 mg oral delayed release capsule 1 cap(s) orally once a day Active  biotin 1000 mcg oral tablet 1 tab(s) orally once a day Active  Vitamin B12 500 mcg oral tablet 1 tab(s) orally once a day Active  Dulcolax Stool Softener sodium 100 mg oral capsule 1 cap(s) orally 2 times a day Active   Review of Systems:  General negative   Performance Status (ECOG) 0   Skin negative   Breast negative   Ophthalmologic negative   ENMT negative   Respiratory and Thorax  see HPI   Cardiovascular negative   Gastrointestinal negative   Genitourinary negative   Musculoskeletal negative   Neurological negative   Psychiatric negative   Hematology/Lymphatics negative   Endocrine negative   Allergic/Immunologic negative   Review of Systems   review of systems obtained from the nurses notes  Nursing Notes:  Nursing Vital Signs and Chemo Nursing Nursing Notes: *CC Vital Signs Flowsheet:   06-Mar-15 11:18  Temp Temperature 96.5  Pulse  Pulse 114  Respirations Respirations 21  SBP SBP 115  DBP DBP 85  Current Weight (kg) (kg) 79.2  Height (cm) centimeters 167  BSA (m2) 1.8   Physical Exam:  General/Skin/HEENT:  General normal   Skin normal   Eyes normal   ENMT normal   Head and Neck normal   Additional PE well-developed female in NAD. No cervical or supra-clavicular adenopathy is appreciated. Lungs are clear to A&P cardiac examination shows regular rate and rhythm. Abdomen is benign. Range of motion of her lower extremities does not elicit pain. Motor sensory and DTR levels are equal symmetric in the upper and lower extremities.   Breasts/Resp/CV/GI/GU:  Respiratory and Thorax normal   Cardiovascular normal   Gastrointestinal normal   Genitourinary normal   MS/Neuro/Psych/Lymph:  Musculoskeletal normal   Neurological normal   Lymphatics normal   Other Results:  Radiology Results: LabUnknown:    16-Feb-15 12:09, PET/CT Scan Lung Cancer Initial Staging  PACS Image   Nuclear Med:  PET/CT Scan Lung Cancer Initial Staging   REASON FOR EXAM:    staging lung cancer stage 4  COMMENTS:       PROCEDURE: PET - PET/CT INIT STAGING LUNG CA  - Feb 16 2013 12:09PM     CLINICAL DATA:  Initial treatment strategy for stage IV lung cancer.    EXAM:  NUCLEAR MEDICINE PET SKULL BASE TOTHIGH    FASTING BLOOD GLUCOSE:  Value: 124 mg/dl    TECHNIQUE:  11.9 mCi F-18 FDG was injected intravenously. Full-ring PET imaging  was performed from the skull base to thigh after the radiotracer. CT  data was obtained and used for attenuation correction and anatomic  localization.    COMPARISON:  NM BONE WHOLE BODY dated 02/13/2013; MR HEAD W/O CM  dated 02/09/2013; CT CHEST W/CM dated 01/21/2013    FINDINGS:  NECK    No hypermetabolic cervical lymph nodes are identified.There are no  lesions of the pharyngeal mucosal space.    CHEST    There are no hypermetabolic mediastinal, hilar or axillary lymph  nodes.  There is persistent consolidation of the right middle and  inferior aspects of the right upper lobes. There are innumerable  peribronchovascular nodules in both lungs, greatest within the right  upper lobe. There is extensive hypermetabolic activity throughout  the collapsed lobes, demonstrating an SUV max of 8.28. No dominant  central hilar mass or endobronchial lesion is identified.There is  low level perihilar activity on the left with an SUV max of 3.3.    ABDOMEN/PELVIS    There is no hypermetabolic activity within the liver, adrenal  glands, spleen or pancreas. There is no hypermetabolic nodal  activity. There is no adrenalmass. The liver demonstrates diffuse  steatosis. There is cortical scarring in the upper poles of both  kidneys and a nonobstructing 11 mm calculus in the lower pole of the  right kidney. There is no evidence of ureteral calculus or ureteral  obstruction.    SKELETON    There is a 3.3 cm  destructive mass involving the left occipital bone  on image 19. This is hypermetabolic; given the proximity to the  adjacent cerebellum, an accurate SUV cannot be obtained. This lesion  is associated with a leftmastoid effusion. There is left maxillary,  ethmoid and sphenoid sinus disease. Additional osseous metastases  are present within the C6 vertebral body (SUV 6.0), the left fifth  rib (SUV 5.8) and the right femoral head (SUV 6.1). The latter is  associated with a central lytic lesion measuring 1.5 cm on image  239. There is no evidence of pathologic fracture or cord  compression.   IMPRESSION:  1. Findings are consistent with bronchogenic carcinoma with  extensive lymphangitic spread of tumor bilaterally, greatest in the  right upper lobe. There is probable superimposed postobstructive  pneumonitis in the right upper and middle lobes.  2. Multifocal osseous metastases involving the left occipital bone,  C6 vertebral body, left fifth rib and right femoral head.  No  evidence of pathologic fracture.  3. No evidence of nodal, hepatic or adrenal metastases.      Electronically Signed    By: Camie Patience M.D.    On: 02/16/2013 14:19     Verified By: Vivia Ewing, M.D.,   Relevent Results:   Relevant Scans and Labs PET CT scan reviewed   Assessment and Plan: Impression:   stage IV adenocarcinoma right lung with pain in her right hip and PET scan positivity in that region for palate ration therapy to both chest and right hip. Plan:   at this time I feel it is prudent to go ahead with palliative ration therapy to her chest. We treated short course 3000 cGy in 10 fractions using three-dimensional treatment planning. I also will treat her right hip to the area of note PET positivity in the right femoral head 3000 cGy in 10 fractions and will overlap for treatment schedules. Risks and benefits of treatment to the chest including dysphasia, cough, skin reaction, fatigue, possible alteration of blood counts or were explained in detail to the patient and her family. I have set her up for CT simulation next week.  I would like to take this opportunity for allowing me to participate in the care of your patient..  Electronic Signatures: Armstead Peaks (MD)  (Signed 06-Mar-15 13:43)  Authored: HPI, Diagnosis, Past Hx, PFSH, Allergies, Home Meds, ROS, Nursing Notes, Physical Exam, Other Results, Relevent Results, Encounter Assessment and Plan   Last Updated: 06-Mar-15 13:43 by Armstead Peaks (MD)

## 2014-04-24 NOTE — Consult Note (Signed)
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 EGFR and ALK negative, intractable nausea and vomiting.  HPI:   Patient last evaluated in clinic on October 20, 2013 were she was asymptomatic and received her 10th cycle of maintenance Avastin. Patient states over the last 24 hours she had increasing nausea and vomiting as well as diarrhea. She has had minimal PO intake over that time frame. She has otherwise felt well.  She is not complaining of dizziness or other neurologic complaints today. Her pain is unchanged and well-controlled on her current narcotic regimen. She denies any recent fevers. She denies any chest pain or cough.  She has no urinary complaints. Patient otherwise feels well and offers no further specific complaints.      Review of Systems:  Performance Status (ECOG): 2  Psych: no complaints  Pain ?: Yes  Pain- Qualitative: Moderate  Pain- Plan: Narcotic analgesic ordered  Discussed with patient: Prohylactic stimulant laxative  Stool softner  Dietary fiber  Fluids  Pain Effectiveness: Pain relief not obtained  Emotional well-being: None  Review of Systems: All other systems were reviewed and found to be negative  Review of Systems:   As per HPI. Otherwise, 10 point system review was negative.   Allergies:  No Known Allergies:   Smoking History: Smoking History Quit 1998, 20 year history.  PFSH: Additional Past Medical and Surgical History: negative.    Family history: Mother with lung cancer, father with prostate cancer.    Social history: Tobacco as above, occasional alcohol.   Home Medications: Medication Instructions Last Modified Date/Time  HYDROmorphone 2 mg oral tablet 1 tab(s) orally every 4 hours, As Needed, pain 21-Oct-15 23:50  fentaNYL 75 mcg/hr transdermal film, extended release 1 patch transdermal every 72 hours 21-Oct-15 23:50  lidocaine 5% topical film Apply  topically to affected area once a day 21-Oct-15 23:50  dronabinol 5 mg oral capsule 1 cap(s) orally 2 times a day 21-Oct-15 23:50  omeprazole 20 mg oral delayed release capsule 1 cap(s) orally once a day 21-Oct-15 23:50  granisetron 1 mg oral tablet 1 tab(s) orally every 12 hours 21-Oct-15 23:50  Compazine tablet 10 mg 1 tab(s) orally every 6 hours x 30 days as needed  for nausea. 21-Oct-15 23:50  Zofran 8 mg oral tablet 1 tab(s) orally every 8 hours, As Needed - for Nausea, Vomiting 21-Oct-15 23:50  Senna 8.6 mg oral tablet 1 tab(s) orally 2 times a day 21-Oct-15 23:50   Vital Signs:  :: vital signs stable, patient afebrile.   Physical Exam:  General: well developed, well nourished, and in no acute distress  Mental Status: normal affect  Eyes: anicteric sclera  Respiratory: clear to auscultation bilaterally  Cardiovascular: regular rate and rhythm, no murmur, rub, or gallop  Gastrointestinal: soft, nondistended, nontender, no organomegaly.  normal active bowel sounds  Musculoskeletal: No edema  Skin: No rash or petechiae noted  Neurological: alert, answering all questions appropriately.  Cranial nerves grossly intact   Laboratory Results: Routine Chem:  22-Oct-15 05:37   Glucose, Serum 75  BUN  5  Creatinine (comp)  0.53  Sodium, Serum 145  Potassium, Serum  3.1  Chloride, Serum  111  CO2, Serum 25  Calcium (Total), Serum  7.2  Anion Gap 9  Osmolality (calc) 285  eGFR (African American) >60  eGFR (Non-African American) >60 (eGFR values <14m/min/1.73 m2 may be an indication of chronic kidney disease (CKD). Calculated eGFR, using  the MRDR Study equation, is useful in  patients with stable renal function. The eGFR calculation will not be reliable in acutely ill patients when serum creatinine is changing rapidly. It is not useful in patients on dialysis. The eGFR calculation may not be applicable to patients at the low and high extremes of body sizes, pregnant women, and  vetetarians.)   Assessment and Plan: Impression:   Stage IV adenocarcinoma of the lung with bone metastasis.  EGFR and ALK negative, intractable nausea and vomiting. Plan:   1.  Lung cancer: Patient received cycle 10 of maintenance Avastin on October 20, 2013. Avastin only lists nausea as a side effect in approximately 10% of patients. This is unlikely to cause of her current admission, but possible. She has been instructed to keep her previously scheduled CT scan next week for restaging purposes and to followup in 3 weeks for consideration of her next treatment.Intractable nausea and vomiting: Unlikely Avastin as above. Continue current anti-emetics.  Consider palliative care consult given patient's multiple hospital admissions.Pain:  Continue current narcotic regimen. consult, call with questions.   Advance Directive:  Advance Directive Theatre stage manager) no   Advance Directive Information Given yes   Adv Dir Follow-up Patient does not desire follow-up with Chaplain regarding Advance Directives   Electronic Signatures: Delight Hoh (MD)  (Signed 22-Oct-15 12:24)  Authored: Note Type, CC/HPI, Review of Systems, ALLERGIES, Smoking Cessation, Patient Family Social History, HOME MEDICATIONS, Vital Signs, Physical Exam, Lab Results Review, Assessment and Plan, Advance Directive   Last Updated: 22-Oct-15 12:24 by Delight Hoh (MD)

## 2014-04-24 NOTE — Discharge Summary (Signed)
PATIENT NAME:  [name], [name] MR#:  [id number] DATE OF BIRTH:  1960-06-23  DATE OF ADMISSION:  06/07/2013 DATE OF DISCHARGE:  06/09/2013  PRIMARY CARE PHYSICIAN: Nonlocal.   DISCHARGE DIAGNOSES: 1.  Systemic inflammatory response syndrome with pneumonia.  2.  Lung cancer with metastasis.  3.  Anemia.  4.  Hypomagnesemia.   CONDITION: Stable.   CODE STATUS: Full code.   HOME MEDICATIONS: Please refer to the medication reconciliation list.   DIET: Regular diet.   ACTIVITY: As tolerated.   FOLLOWUP CARE: Follow up with PCP within 1 to 2 weeks. Follow up with Dr. Grayland Ormond within 1 to 2 weeks.   REASON FOR ADMISSION: Nausea, vomiting, diarrhea for 3 days with shortness of breath, cough.   HOSPITAL COURSE: The patient is a 54 year old Caucasian female with a history of lung cancer with metastasis, pneumonia, who presented to the ED with nausea, vomiting, diarrhea for 3 days. In addition, the patient has shortness of breath, cough with sputum but denies any fever, chills. Chest x-ray showed possible pneumonia. The patient was treated with vancomycin and Levaquin and admitted to the hospital. For detailed history and physical examination, please refer to the admission note dictated by me. Chest x-ray on admission date showed significant interstitial disease with aspect of the right upper lobe with likely residual right hilar lymphadenopathy and postobstructive pneumonitis or pneumonia. Urinalysis is negative. WBC 3.4, hemoglobin 10.3.  1. Sepsis with pneumonia. After admission, the patient has been treated with vancomycin, Zosyn and Levaquin. Since the patient's symptoms have been improving, vancomycin and Zosyn were discontinued. We will continue Levaquin and Levaquin p.o. after discharge. So far, the patient's WBC increased to normal range. Blood culture is negative. The patient's nausea and vomiting has improved.  2.  Anemia and thrombocytopenia, stable.  3.  Lung cancer with metastasis. 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  Dr. Grayland Ormond evaluated the patient and suggested to follow up with him as outpatient for possible chemotherapy in the future.   The patient's symptoms have much improved. Vital signs are stable. She is clinically stable and will be discharged to home today. I discussed the patient's discharge with the patient, nurse, case manager.   TIME SPENT: About 36 minutes.   ____________________________ Demetrios Loll, MD qc:cs D: 06/09/2013 16:19:55 ET T: 06/09/2013 20:17:19 ET JOB#: [id number]  cc: Demetrios Loll, MD, <Dictator> Demetrios Loll MD ELECTRONICALLY SIGNED 06/10/2013 15:05

## 2014-04-24 NOTE — H&P (Signed)
PATIENT NAME:  [name], [name] MR#:  [id number] DATE OF BIRTH:  08-24-1960  DATE OF ADMISSION:  01/09/2013  PRIMARY CARE PHYSICIAN:  None.   REFERRING PHYSICIAN:  Dr. Shirlyn Goltz.  CHIEF COMPLAINT:  Shortness of breath.   HISTORY OF PRESENT ILLNESS:  [name] is a 54 year old female with no past medical history, presented to the Emergency Department with complaints of shortness of breath, cough for the last three weeks.  It started as a dry cough which has been gradually getting worse.  Does not have any productive sputum.  Denies having any fever.  The patient states has been experiencing severe shortness of breath even with mild exertion.  Concerning this, came to the Emergency Department.  Work-up in the Emergency Department a CT angiogram shows pericardial effusion as well as extensive right lung consolidation, multifocal, especially severely affecting the right middle and upper lobes associated with right-sided parapneumonic effusion.  The patient does not have any elevated white blood cell count.  The patient received levofloxacin in the Emergency Department.  The patient denies recent travel.  No sick contacts.   PAST MEDICAL HISTORY:  None.  PAST SURGICAL HISTORY:  None.   ALLERGIES:  No known drug allergies.   HOME MEDICATIONS:  Omeprazole oral once daily, Flexeril 10 mg three times a day, but the patient denied taking any medication.   SOCIAL HISTORY:  Denies smoking, drinking alcohol or using illicit drugs.   FAMILY HISTORY:  Strong family history of diabetes mellitus, hypertension.   REVIEW OF SYSTEMS:   CONSTITUTIONAL:  Generalized weakness. EYES:  No change in vision.  EARS, NOSE, THROAT:  No change in hearing.  RESPIRATORY:  Has cough, shortness of breath.  CARDIOVASCULAR:  No chest pain, palpitations.  GASTROINTESTINAL:  No nausea, vomiting, abdominal pain.  GENITOURINARY:  No dysuria or hematuria.  ENDOCRINE:  No polyuria or polydipsia.  SKIN:  No rash or lesions.   MUSCULOSKELETAL:  No joint pains and aches.  HEMATOLOGIC:  No easy bruising and bleeding.  NEUROLOGIC:  No weakness or numbness in any part of the body.   PHYSICAL EXAMINATION: GENERAL:  This is a well-built, well-nourished, age-appropriate female lying down in the bed, not in distress.  VITAL SIGNS:  Temperature 97.8, pulse 101, blood pressure 155/94, respiratory rate of 20, oxygen saturations 96% on room air.   HEENT:  Head normocephalic, atraumatic.  Eyes, no scleral icterus.  Conjunctivae normal.  Pupils equal and react to light.  Extraocular movements are intact.  Mucous membranes moist.  No pharyngeal erythema.  NECK:  Supple.  No lymphadenopathy.  No JVD.  No carotid bruit.  No thyromegaly.  CHEST:  Has no focal tenderness.  Coarse breath sounds bilaterally, more on the right side.  HEART:  S1, S2 regular, tachycardia.  No murmurs are heard.  ABDOMEN:  Bowel sounds plus.  Soft, nontender, nondistended.  No hepatosplenomegaly.  EXTREMITIES:  No pedal edema.  Pulses 2+. NEUROLOGIC:  The patient is alert, oriented to place, person and time.  Cranial nerves II through XII intact.  Motor 5 by 5 in upper and lower extremities.  SKIN:  No rash or lesions.  MUSCULOSKELETAL:  Good range of motion in all the extremities.   LABORATORY DATA:  CBC is completely within normal limits.   CMP is completely within normal limits.  Troponin less than 0.02.  ABGs, PaO2 of 59.  The rest of all the values are within normal limits.  CT angiogram negative for PE, extensive multifocal infection most severely  affecting the right middle and upper lobe with associated right-sided parapneumonic effusion, innumerable nonspecific pulmonary nodules within the aerated portions of the right lung on the contralateral left lung which may represent hematogenesis distribution of the infection.  Cannot exclude metastatic disease.  Moderate-sized pericardial effusion.    ASSESSMENT AND PLAN:  [name] is a 54 year old female  who comes to the Emergency Department with shortness of breath for three weeks duration.  1.  Pulmonary infiltrates/pneumonia.  This does not seem to be bacterial infectious cause.  The patient does not have any fever.  No elevated white blood cell count.  No productive sputum.  Highly concerning about infectious or inflammatory process.  Consult pulmonary.  Concerning the patient's extensive lung findings, start the patient on vancomycin and cefepime covering for broad-spectrum antibiotics.  2.  Pericardial effusion.  Again, concerning for a similar process.  We will obtain echocardiogram.  3.  Keep the patient on deep vein thrombosis prophylaxis with Lovenox.   TIME SPENT:  45 minutes.    ____________________________ Monica Becton, MD pv:ea D: 01/10/2013 02:20:44 ET T: 01/10/2013 03:11:49 ET JOB#: [id number]  cc: Monica Becton, MD, <Dictator> Monica Becton MD ELECTRONICALLY SIGNED 01/25/2013 21:12

## 2014-04-24 NOTE — Discharge Summary (Signed)
PATIENT NAME:  [name], [name] MR#:  [id number] DATE OF BIRTH:  05-Dec-1960  DATE OF ADMISSION:  10/21/2013 DATE OF DISCHARGE:  11/03/2013  PRIMARY CARE PHYSICIAN:  Kathlene November. Grayland Ormond, MD, Oncology.  FINAL DIAGNOSES: 1.  Intractable nausea, and vomiting.  2.  Weakness, upper extremities.  3.  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  4.  Hypertension and tachycardia.  5.  Chronic bone pain.   MEDICATIONS ON DISCHARGE: Include Compazine 10 mg every 6 hours as needed for nausea, Zofran 8 mg every 8 hours as needed for nausea, Kytril 1 mg 1 tablet orally every 12 hours, omeprazole 20 mg daily, Marinol 5 mg twice a day, lidocaine 5% topical film applied to effected area daily, fentanyl patch 75 mcg/h, applied to chest wall every 72 hours, senna 8.6 mg 1 tablet twice a day, prednisone 5 mg 4 tablets day 1, 3 tablets day 2, 2 tablets day 3 and 4, 1 tablet day 5 and 6, hydromorphone 2 mg every 4 hours as needed for pain, gabapentin 300 mg 3 times a day, Tussionex 5 mL every 12 hours as needed for cough, Carafate 1 gram per 10 mL, 10 mL 4 times a day with meals and at bedtime, metoprolol tartrate 25 mg twice a day.   DIET: Low-sodium diet, regular consistency.   ACTIVITY: As tolerated.   FOLLOWUP:  1 to 2 weeks with Dr. Grayland Ormond.   HOSPITAL COURSE: The patient was admitted 10/21/2013 and discharged 11/03/2013. Please see interim summary dictated by Dr. Margaretmary Eddy on 10/30/2013 for hospital course up until that point.    PROCEDURE DURING THE HOSPITAL COURSE: Included an upper GI endoscopy by Dr. Vira Agar on 10/27/2013 that showed grade B esophagitis. No bleeding, questionable minimal esophageal ring, small hiatal hernia, stomach normal, duodenum normal.   LABORATORY AND RADIOLOGIC DATA: On the last few days: Included a CPK of 24. White blood cell count 3.2, H and H 10.9 and 33.9, platelet count of 202,000, glucose 112, BUN 3, creatinine 0.7, sodium 143, potassium 3.4, chloride 105, CO2 of 30, calcium 8.2. MRI  cervical spine showed a C5 vertebral body metastases without evidence of epidural tumor, moderate lower cervical spondylosis, worse at C6-7, where there is a moderate right and mild left neuroforaminal stenosis, mildly progressed from prior MRI.   HOSPITAL COURSE PER PROBLEM LIST:  1.  For the patient's nausea and vomiting, the patient still has some nausea but able to tolerate diet upon discharge. 2.  Weakness of the upper extremities.  MRI showed a neuroforaminal narrowing. I did give a high dose of Decadron x1 IV, a dose of Solu-Medrol IV and a prednisone taper. Strength had improved on the upper extremities. Follow up as outpatient.  3.  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  Overall prognosis is poor. Follow-up with Dr. Grayland Ormond.  Patient is a full code. Has seen palliative care are on numerous hospitalizations.  4.  Hypertension and tachycardia. Heart rate as high as 130s here in the hospital. I did start metoprolol 25 mg twice a day.  I am limited with blood pressure being a little bit on the lower side with the metoprolol. I am happy if her heart rate around 100 to 110.  5.  Chronic bone pain. The patient does have a fentanyl patch, Dilaudid orally, gabapentin and lidocaine patches.  TIME SPENT ON DISCHARGE: 35 minutes.     ____________________________ Tana Conch. Leslye Peer, MD rjw:jp D: 11/04/2013 10:38:00 ET T: 11/04/2013 11:45:19 ET JOB#: [id number]  cc: Christia Reading  Bridget Hartshorn, MD Tana Conch. Leslye Peer, MD, <Dictator>  Marisue Brooklyn MD ELECTRONICALLY SIGNED 11/12/2013 1:33

## 2014-04-24 NOTE — Discharge Summary (Signed)
PATIENT NAME:  [name], SPADACCINI MR#:  [id number] DATE OF BIRTH:  1960/03/07  DATE OF ADMISSION:  07/11/2013 DATE OF DISCHARGE:  07/16/2013  ADMITTING DIAGNOSIS: Systemic inflammatory response reaction due to nausea, vomiting, and right back pain.    DISCHARGE DIAGNOSIS:   1.  Right upper quadrant abdominal pain. 2.  Nausea and vomiting, resolved.  3.  Chest pain, suspected pneumonia versus neuropathy versus low-grade pleurisy. 3.  Hyponatremia, likely dehydration-related, and dehydration, resolved on IV fluids.   4.  History of lung carcinoma.   DISCHARGE CONDITION: Stable.   DISCHARGE MEDICATIONS: The patient is to continue promethazine 12.5 mg every 8 hours as needed, multivitamins once daily, omeprazole 10 mg p.o. daily, Biotin 1000 mcg p.o. daily, vitamin B12 500 mcg p.o. daily, Dulcolax 100 mg p.o. twice daily, Compazine 10 mg every 6 hours as needed, Acetaminophen/oxycodone 325/5 mg 2 tablets every 4 hours as needed, MS Contin 30 mg p.o. every 12 hours, cannabinol 5 mg p.o. twice daily, Granisetron 1 mg every 12 hours as needed, megestrol 40 mg in 1 mL oral suspension 10 mL twice daily, prednisone 60 mg p.o. once and then taper x 10 mg daily until stopped, Fentanyl 50 mcg patch every 3 days, hydromorphone which is Dilaudid 1 mg every 4 hours as needed, Senna 1 tablet twice daily, Levaquin 500 mg p.o. once daily for 6 more days, Ensure 240 mg twice daily, and gabapentin 300 mg p.o. 4 times daily as needed, and Cymbalta 30 mg p.o. at bedtime.  HOME OXYGEN: None.   DIET: Regular with Ensure twice a day, regular consistency.   ACTIVITY LIMITATIONS: As tolerated.   FOLLOW-UP:  Followup appointment with Dr. Oliva Bustard in 2-3 days after discharge.   CONSULTANTS: Dr. Oliva Bustard.  Nurse practioner Georgeanne Nim, Dr. Kallie Edward .  RADIOLOGIC STUDIES: Chest x-ray PA and lateral, 07/11/2013, showing right upper lobe airspace disease, which was not significantly change from 06/11/2013. Differential includes   pneumonia, chronic pneumonia, or potential lung cancer recurrence.  CT scan of chest with IV contrast to rule out pulmonary embolism 07/11/2013, revealed no acute cardiopulmonary disease specifically, no evidence of pulmonary embolism.  Improved aeration in the right upper and middle lobes to suggest interval response to treatment with improvement post obstructive atelectasis, residual nodular heterogenous airspace opacities within the right lower lobes and we suspected lymphangitic tumor was in the right upper lobe worrisome for residual disease,  continue attention on follow-up was recommended.  Evolving postsurgical change in the right pneumothorax has a sequela of prior open right upper lobe biopsy as above, increased sclerosis of known metastasis involving the inferior aspect of the left 5th rib suggests treatment response.   MRI of thoracic spine without contrast 07/18/2013, showing no evidence of acute abnormality  in the thoracic spine. No compression fracture.  Small disk protrusions in the mid thoracic spine without stenosis were noted. Right upper quadrant ultrasound 07/15/2013, showed stable right renal stone, but no other acute focal abnormality in.  Head CT scan for lung cancer staging 07/16/2013, revealed multiple ill-defined areas of consolidation within the right upper, middle, and lower lobes which show hypermetabolic activity.  These are indeterminate, and could be due to carcinoma, infectious or inflammatory etiology. Comparison with any older outside chest CT or PET scan would be helpful if available. No evidence of hypermetabolic lymphadenopathy.  Multiple bone metastases showed low-grade metabolic activity, greatest in the right femoral head was SUV max of 2.3.   The patient is a 54 year old Caucasian female with past medical  history significant for history of lung carcinoma who is undergoing chemotherapy, presents to the hospital with right upper back pain along with nausea and vomiting.  Please refer to Dr. Gus Height Patel's admission note on 07/11/2013.   On arrival to the hospital, the patient's vital signs, temperature was 97.5, pulse was 99, respirations was 18 to 20, blood pressure 122/80, saturation was 100% on room air. Physical exam revealed tachycardia, otherwise diffuse breath sounds at bases, but no audible rales, rhonchi, or respiratory distress or use of accessory muscles.   The patient's lab data done on admission, 07/11/2013, showed a glucose level of 100, sodium 134, CO2 was 17. Lipase level was 132. Liver enzymes revealed elevation of alkaline phosphatase to 179; otherwise, a complete metabolic panel was unremarkable. Troponin was less than 0.02. White blood cell count was elevated to 14.7, hemoglobin was 14.8, platelet count 166,000.  MCV was high at 106.  Absolute neutrophil count was 13.8. Blood cultures taken on 07/11/2013, showed no growth. EKG showed sinus tach at 117 beats per minute, RSR prime or QR prime in lead V1 suggesting right ventricular conduction delay.  The patient had chest x-ray, as well as CT scan of the chest which showed no pulmonary embolism and ill-defined areas of questionable recurrence of cancer or pneumonia.  The patient was started on antibiotic therapy for possible pneumonia and increasing doses of pain medications. Her condition did not improve initially requiring hydration with IV fluids; however, with conservative management and advanced medication doses, the pain improved, as well as no nausea and vomiting resolved.  I will try to determine the cause of the patient's discomfort and pain, which was quite severe, and due to hypersensitive skin in the area, right upper back, and then sensitivity to palpation of  thoracic spine, we did a MRI of her thoracic spine which showed no significant abnormalities. We had also a CT scan of both lungs to rule out further cancer recurrence or other changes, including bone metastasis in that area; however, only  multiple area of consolidation  in right upper, middle, as well as lower lobe were noted, which showed hypermetabolic activity and it was unclear if this was related to carcinoma, infection, or some other inflammatory etiology. However, there was no bone metastasis in that particular area noted. There was no comparison of that CT scan unfortunately done.  The patient's medications were advanced to current levels and the patient's pain control was, otherwise, very satisfactory.  Patient was advised to follow up with her oncologist Dr. Grayland Ormond or Dr. Oliva Bustard in the next few days after discharge to manage her condition. 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 We initiated the patient on pain medications with opiates, and advanced dosages also.  Advanced her Neurontin and added Cymbalta to pain management. The patient was complaining of some right upper quadrant discomfort and, for this reason, we got an ultrasound of her right upper quadrant to rule out gallbladder disease and that was unremarkable. The patient is to follow up with her primary care physician and make decisions about management of her pain and further diagnosis as outpatient.   In regards to hyponatremia, the patient's hyponatremia was felt to  be dehydration-related since it resolved with IV fluid administration  Patient is being discharged in relatively stable condition with the above-mentioned medications and follow-up.   On the day of discharge, temperature was 98.3, pulse was 89, respirations 18, blood pressure 147/96, saturation was 97% on room air at rest.   TIME SPENT: 40 minutes with the  patient.    ____________________________ Theodoro Grist, MD rv:ts D: 07/16/2013 18:18:02 ET T: 07/16/2013 18:54:30 ET JOB#: [id number]  cc: Theodoro Grist, MD, <Dictator> Janak K. Oliva Bustard, MD Theodoro Grist MD ELECTRONICALLY SIGNED 07/29/2013 14:29

## 2014-04-24 NOTE — Consult Note (Signed)
Pt with contd nausea, had esophagitis on EGD likely from heaving.  No further GI recommendations at this time, I will sign off, reconsult if needed.  Electronic Signatures: Manya Silvas (MD)  (Signed on 28-Oct-15 17:31)  Authored  Last Updated: 28-Oct-15 17:31 by Manya Silvas (MD)

## 2014-04-24 NOTE — Op Note (Signed)
PATIENT NAME:  [name], [name] MR#:  [id number] DATE OF BIRTH:  [birth year]  DATE OF PROCEDURE:  02/12/2013.  PREOPERATIVE DIAGNOSIS:  Lung cancer with poor venous access.  POSTOPERATIVE DIAGNOSIS:  Lung cancer with poor venous access.   PROCEDURE: 1.  Ultrasound guidance for vascular access, right internal jugular vein.  2.  Fluoroscopic guidance for placement of catheter.  3.  Placement of CT-compatible Port-A-Cath, right internal jugular vein.   SURGEON:  Algernon Huxley, M.D.   ANESTHESIA:  Local with moderate conscious sedation.   FLUOROSCOPY TIME:  Less than 1 minute.   CONTRAST:  Zero.   ESTIMATED BLOOD LOSS:  25 mL.   INDICATION FOR PROCEDURE:  This is a 54 year old female with lung cancer who needs a Port-A-Cath for chemotherapy. The risks and benefits were discussed. Informed consent was obtained.   DESCRIPTION OF THE PROCEDURE:  The patient was brought to the Vascular and Interventional Radiology Suite. The right neck and chest were sterilely prepped and draped, and a sterile surgical field was created. Ultrasound was used to help visualize a patent right internal jugular vein. This was then accessed under direct ultrasound guidance without difficulty with a Seldinger needle and a permanent image was recorded. A J-wire was placed. After skin nick and dilatation, the peel-away sheath was then placed over the wire. I then anesthetized an area under the clavicle approximately 2 fingerbreadths. A transverse incision was created and an inferior pocket was created with electrocautery and blunt dissection. The port was then brought onto the field, placed into the pocket and secured to the chest wall with 2 Prolene sutures. The catheter was connected to the port and tunneled from the subclavicular incision to the access site. Fluoroscopic guidance was used to cut the catheter to an appropriate length. The catheter was then placed through the peel-away sheath and the peel-away sheath was removed.  The catheter tip was parked in excellent location in the cavoatrial junction. The pocket was then irrigated with antibiotic-impregnated saline and the wound was closed with a running 3-0 Vicryl and a 4-0 Monocryl. The access incision was closed with a single 4-0 Monocryl. The Huber needle was used to withdraw blood and flush the port with heparinized saline. Dermabond was then placed as a dressing. The patient tolerated the procedure well and was taken to the Recovery Room in stable condition.     ____________________________ Algernon Huxley, MD jsd:jm D: 02/12/2013 11:27:06 ET T: 02/12/2013 11:36:48 ET JOB#: [id number]  cc: Algernon Huxley, MD, <Dictator> Algernon Huxley MD ELECTRONICALLY SIGNED 02/18/2013 10:51

## 2014-04-24 NOTE — Consult Note (Signed)
PATIENT NAME:  [name], DEBERRY MR#:  [id number] DATE OF BIRTH:  1960/11/10  DATE OF CONSULTATION:  08/09/2013  REFERRING PHYSICIAN:  Leonie Douglas. Doy Hutching, MD  CONSULTING PHYSICIAN:  Zyan Coby R. Ma Hillock, MD  REASON FOR CONSULTATION: Lung cancer followed by Dr. Grayland Ormond.   HISTORY OF PRESENT ILLNESS: The patient is a 54 year old female with a past medical history remarkable for stage IV lung cancer, most recently received Taxol, carboplatin, Avastin treatment on July 7 and then received dose of Avastin 3 weeks later. The patient has been admitted to hospital with refractory nausea and vomiting. States her nausea is constant, only gets partial relief from granisetron tablets, but gradually symptoms got worse and she has been unable to keep food or fluid down. She was having symptoms of dehydration, including lightheadedness. Denies any falls or loss of consciousness otherwise. She had some diarrhea, which is better today. Denies any bleeding issues. She also has persistent right lower rib pain, has history of bone metastasis.   PAST MEDICAL HISTORY AND PAST SURGICAL HISTORY:  1.  Lung cancer as described above.  2.  Chronic pain.  3.  Ex-smoker.   FAMILY HISTORY: Remarkable for breast cancer, lung cancer, heart disease, colon cancer, and TIA.    SOCIAL HISTORY: Ex-smoker, quit 18 years ago. Denies alcohol or recreational drug usage.   ALLERGIES: No known drug allergies.   HOME MEDICATIONS: Vitamin B12, 500 mcg daily; omeprazole 20 mg daily; multivitamin 1 daily; morphine extended release 30 mg q. 12 hours; Zofran 8 mg q. 8 hours p.r.n.; Megace 40 mg 10 mL b.i.d.; hydromorphone 1 mg q. 4 hours p.r.n.; gabapentin 300 mg q.i.d.; Duragesic 75 mcg patch q. 72 hours; Marinol 5 mg b.i.d.; Cymbalta 30 mg daily at bedtime; Compazine 10 mg daily; ondansetron p.r.n.   REVIEW OF SYSTEMS:  CONSTITUTIONAL: Still feeling weak and tired. Denies any dizziness at rest. No fevers or chills.  HEENT: No headaches,  epistaxis, ear or jaw pain. Denies sinus symptoms.  CARDIAC: No angina, palpitation, orthopnea, or PND.  LUNGS: Has mild cough, mild dyspnea on exertion. No hemoptysis or chest pain.  GASTROINTESTINAL: As in HPI. No blood in stools or melena.  GENITOURINARY: No dysuria or hematuria.  MUSCULOSKELETAL: As in HPI.  SKIN: No new rashes or pruritus.  NEUROLOGIC: Denies any new focal weakness, seizures, or loss of consciousness.  ENDOCRINE: No polyuria or polydipsia. Appetite is poor.   PHYSICAL EXAMINATION:  GENERAL: The patient is sitting in bed, tired looking, otherwise alert and oriented x 4 and converses appropriately. No acute distress.  VITAL SIGNS: Temperature 98.3, pulse 88, respirations 20, blood pressure 110/72, oxygen saturation 93% on room air.  HEENT: Normocephalic, atraumatic. Extraocular movements intact. Mouth is dry. No oral thrush.  NECK: Negative for lymphadenopathy.  CARDIOVASCULAR: S1, S2, regular rate and rhythm.  LUNGS: Show bilateral diminished breath sounds overall. No rhonchi.  ABDOMEN: Soft, nontender. No hepatomegaly. Bowel sounds present.  MUSCULOSKELETAL: No obvious joint redness or swelling. Has right lower rib tenderness.  EXTREMITIES: No major edema or cyanosis.  SKIN: No generalized rashes or major bruising.  NEUROLOGIC: Cranial nerves are intact, moves all extremities spontaneously. Gait not checked.   LABORATORY RESULTS: WBC 2000, ANC 1100, hemoglobin 10.1, platelets 128,000. Creatinine 0.66, calcium 7.8, potassium 3.4, glucose 79. Blood culture negative so far.  IMAGING: Chest x-ray: Stable findings, no acute abnormalities.   IMPRESSION AND RECOMMENDATION: A 54 year old female patient with a history of advanced lung cancer status post 6 cycles of Taxol and carboplatin chemotherapy along  with Avastin, and more recently received maintenance Avastin treatment by Dr. Grayland Ormond, currently admitted with persistent upper gastrointestinal symptoms with symptoms of  dehydration. Diarrhea is better. Agree with continuing on intravenous Kytril since only this provides relief per patient report. Continue supportive treatment and intravenous fluids. She has mild pancytopenia secondary to myelosuppression from recent chemotherapy, absolute neutrophil count is otherwise adequate at 1100. Oncology will continue to follow as indicated for any issues.   Thank you for the referral. Please feel free to contact me for additional questions.    ____________________________ Rhett Bannister Ma Hillock, MD srp:sk D: 08/09/2013 23:45:15 ET T: 08/10/2013 00:11:57 ET JOB#: [id number]  cc: Saran Laviolette R. Ma Hillock, MD, <Dictator> Alveta Heimlich MD ELECTRONICALLY SIGNED 08/10/2013 14:31

## 2014-04-24 NOTE — Consult Note (Signed)
Brief Consult Note: Diagnosis: NV.   Patient was seen by consultant.   Consult note dictated.   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 Describes persistent NV starting around this time with some loose yellowish stools. Denies abdominal pain/fever. PO intake decreased greatly. States she is passing flatus well and that her stools have been improving in form over the last couple of days.  States she has never had NV with her chemotherapy in past, although NVD is listed as side effect of the Avastin.  States no problems swallowing, no signs of blood or blackness to stools. No hx cspy/egd. Started rocephin and azithromycin yesterday for bronchitis had been getting flagyl for poss cidff: states that greatly increased her NV- flagyl has been dc'd today because of this. Has received zofran, reglan, promethazine while here with some benefit. Do note in chart review nausea as a complaint 7/15, 8/15, 9/15, and part of reasons for hospitalizations (Jul and Sep of this year). She has several home meds for this. Nursing staff state no diarhea since admission.  10/25/13 CT scan of abdomen/pelvis (noncontrasted) with possible biliary sudge but no cholecystitis, hepatic steatosis. There is a nonobstrucing kidney stone, small pleural effusions, small amt pericardial fluid of doubtful significance, diverticulosis without diverticulitis. Stool culture and hemoccult test pending. Did have bone scan 7/15 with likely mets to rt femoral head, C6 vertebral body, lt occipital bone, lt 5th rib and right ribs 4&7. Petscan 7/15 with increased uptake to rul/rml/rll with most activity in rll. Abdominal US 6/15 unremarkable. Liver panel with low but improving albumin, otherwise unremarkable.  on exam abd soft, nondistended. Mild ruq tenderness. Frequent dry cough. decrease breath sounds bilat lower bases.   Impression/plan: NV. Likely medication side effect: will schedule some zofran and leave the phenergan/reglan prn.  Will discuss further with Dr Tiffany Kocher..  Electronic Signatures: Stephens November H (NP)  (Signed 26-Oct-15 14:30)  Authored: Brief Consult Note   Last Updated: 26-Oct-15 14:30 by Theodore Demark (NP)

## 2014-04-24 NOTE — H&P (Signed)
PATIENT NAME:  [name], SOSINSKI MR#:  [id number] DATE OF BIRTH:  1960-01-07  DATE OF ADMISSION:  08/08/2013  PRIMARY CARE PHYSICIAN:  None.  PRIMARY ONCOLOGIST:  Kathlene November. Grayland Ormond, MD.  CHIEF COMPLAINT:  Refractory nausea and vomiting.    HISTORY OF PRESENT ILLNESS:  This is a 54 year old Caucasian female with a past medical history of right-sided lung cancer with metastasis to the ribs, who presents today for 3 days of refractory  nausea and vomiting.  She has a significant past medical history of chronic pain as well, likely due to bony metastases from her cancer.  She also has a past medical history of tobacco abuse.  She states that Thursday this past week, she began having diarrhea, nausea and vomiting.  She contacted her 89 office and took over-the-counter antidiarrheals as well as the antiemetics that she had at home, but was not able to keep any of her medicine down for it to take effect and her nausea and vomiting has gotten significantly worse and so she came in.  She began to get dehydrated to the hospital for treatment here.  Her last dose of chemotherapy was on 07/28/2013; this was maintenance chemotherapy Avastin.  In the ED here, she was given several IV doses of antiemetics with minimal to know improvement as well as a couple of doses of Dilaudid for her chronic pain with good improvement in her pain.  Hospitalist service was then called for admission.  PAST MEDICAL HISTORY:  Right lung cancer, chronic nausea and vomiting, chronic pain likely due to metastasis, tobacco abuse in the past.  ALLERGIES: No known drug allergies.  MEDICATIONS:  Zofran 8 mg p.o. q. 8 p.r.n., vitamin B12 500 mcg p.o. daily, Senna 1 tablet p.o. b.i.d., omeprazole 20 mg p.o. daily, multivitamin 1 tablet daily, morphine extended release 30 mg per 12 hour tablet p.o. q. 12 hours, megestrol 40 mg per mL oral suspension 10 mL p.o. b.i.d., hydromorphone 1 mg p.o. q. 4 p.r.n. pain, gabapentin 300 mg p.o. 4  times daily, Duragesic 75 transdermal 1 patch every 72 hours, dronabinol 5 mg p.o. 2 times daily, Cymbalta 30 mg p.o. daily at bedtime, Compazine 10 mg 1 tablet p.o. every 2-6 hours as needed for nausea.  SOCIAL HISTORY:  She lives at home alone.  She is a former smoker.  She quit 18 years ago.  She denies any drug use or alcohol use.  FAMILY HISTORY:  Mother had breast cancer and lung cancer as well as myocardial infarction.  In the past, father had colon cancer and a TIA in the past.  REVIEW OF SYSTEMS:  CONSTITUTIONAL:  Positive for fatigue and weakness.  No fevers.  Positive for chills and sweats. EYES:  No blurred or double vision.  No cataracts. ENT:  No sore throat. No tinnitus.  No ear pain.  No postnasal drip. RESPIRATORY:  No cough, no wheeze, no hemoptysis.  No shortness of breath. CARDIOVASCULAR:  No chest pain, no edema.  No dyspnea.  No dyspnea on exertion.  No palpitations. GASTROINTESTINAL:  Positive for nausea, vomiting and diarrhea.  Positive for abdominal wall muscle pain. GENITOURINARY:  No dysuria.  No hematuria.  No frequency. ENDOCRINE:  No polyuria, no thyroid problems. HEMATOLOGY:  No easy bruising or bleeding. SKIN:  No rash or lesion.  No acne. MUSCULOSKELETAL:  No arthritis.  No swelling. NEUROLOGIC:  No dizziness.  No ataxia.  No dysarthria. PSYCHIATRIC:  No anxiety, depression.  PHYSICAL EXAMINATION: VITAL SIGNS:  Temperature 98, pulse 92,  respirations 18, O2 saturations 96% on room air.  Blood pressure 92/71. GENERAL:  The patient is awake, alert and oriented x3 in mild distress due to nausea and vomiting. HEENT:  Atraumatic, normocephalic.  Pupils are equal, round and reactive to light.  Extraocular movements intact.  Oral mucosa is dry. NECK:  Supple with no JVD.  No cervical lymphadenopathy. RESPIRATORY:  Clear to auscultation bilaterally.  No rales, wheezes or rhonchi. CARDIOVASCULAR:  S1, S2 present.  Regular rate.  No murmurs, rubs, or gallops.  No  peripheral edema. MUSCULOSKELETAL:  Full range of motion with spontaneous movement in all 4 extremities. ABDOMEN:  Soft, nontender, nondistended. NEUROLOGIC:  Cranial nerves grossly intact.  No focal motor or sensory deficits. SKIN:  Warm, dry and intact.  No rashes or lesions detected on exam. PSYCHIATRIC:  The patient is awake, alert and oriented.  Mood and affect are appropriate.  LABORATORY DATA:  White blood count 3.7, hemoglobin 11.3, hematocrit 34.2, platelets 141,000.  Sodium 139, potassium 3.4, chloride 106, bicarbonate 22, anion gap is 11.  Creatinine 0.58, BUN 5, glucose 105, calcium 8.3.  Lipase is low at 58.  LFTs are within normal limits.  Albumin is 2.8, total protein 6.5.  RADIOLOGY DATA:  Chest x-ray showed stable chest radiograph with continued interstitial and airspace opacities in the mid and upper right lung which are nonspecific and maybe secondary to carcinoma infection or inflammation.  ASSESSMENT AND PLAN:  This is a 53 year old Caucasian female with past medical history and presentation as above.  See HPI. 1.  Refractory nausea and vomiting.  As stated above, she is well past her last dose of chemotherapy.  She has had 3 days of nausea, vomiting, and diarrhea.  Per the patient, this is something that recurs with her frequently.  She was simply unable to keep down any of her antidiarrheal, antiemetic medications at home and she typically improves with admission and IV medications.  Here, we will start IV fluids and IV antiemetics as well as some IV pain medications for pain control.  We will leave her with her Duragesic patch.  Will admit her to an observation bed for now and hopefully, she will improve.  If not, she may have to be converted to inpatient status. 2.  Chronic pain.  This is likely secondary to her cancer and subsequent bony metastases.  She is on a significant dose of pain medications at home, but she has been unable to take them due to her nausea and vomiting.   We will treat her with IV pain medications here and her Duragesic patch as stated above. 3.  Right lung cancer.  She is currently on chemotherapy per Dr. Gary Fleet protocol.  Her primary oncologist, last treatment as stated above.  We can consider an oncology consult in the morning. 4.  History of tobacco abuse.  She quit smoking 18 years ago.  CODE STATUS:  Full.  TIME SPENT:  45 minutes.   ____________________________ Wilford Corner. Jannifer Franklin, MD dfw:ds D: 08/08/2013 18:22:56 ET T: 08/08/2013 18:54:16 ET JOB#: [id number]  cc: Wilford Corner. Jannifer Franklin, MD, <Dictator> Berline Semrad Fawn Kirk MD ELECTRONICALLY SIGNED 09/08/2013 9:44

## 2014-04-24 NOTE — H&P (Signed)
PATIENT NAME:  [name], [name] MR#:  [id number] DATE OF BIRTH:  1960/06/13  DATE OF ADMISSION:  09/27/2013  PRIMARY ONCOLOGIST: Kathlene November. Grayland Ormond, MD.   REFERRING PHYSICIAN:  Dr. (Dictation Anomaly)  PRIMARY CARE PHYSICIAN:  None at present.   HISTORY OF PRESENT ILLNESS: The patient is a 54 year old Caucasian female with history of metastatic lung cancer with metastases to the bone, presents to Emergency Room with dizziness and nausea. She had vomiting x 1. She gets chemotherapy and radiation therapy. Reportedly she has completed her radiation therapy, now on maintenance chemotherapy. Her last chemotherapy was 3 weeks ago. She apparently went for IV fluids this past Tuesday and received a liter of IV fluids, was sent home doing well, however, started having vomiting x 1. No diarrhea and just not feeling well. She was somewhat tachycardiac when she came in the Emergency Room with heart rate in the 120s. She received IV fluids, work-up essentially was negative. No fever was documented. The patient was advised to be discharged from the Emergency Room. However, she got upset and was wanting to get some more fluids and hospitalist was called for further evaluation and management. Clinically she appears overall stable. She is being admitted for mild dehydration.   PAST MEDICAL HISTORY:  Metastatic lung cancer undergoing maintenance chemotherapy.  ALLERGIES: No known drug allergies.   SOCIAL HISTORY: She lives at home alone. Denies any smoking or alcohol use. She used to smoke but quit. No history of any illicit drug use.   FAMILY HISTORY: Mother has history of lung and breast cancer. Dad had a history of prostate cancer.   MEDICATIONS:  1.  Zofran 8 mg every 8 hours as needed. 2.  Vitamin B12 500 mcg p.o. daily.  3.  Senna 1 tablet b.i.d. 4.  Omeprazole 20 mg p.o. daily.  5.  Multivitamin 1 tablet daily.  6.  Hydromorphone 2 mg every 4 hours as needed.  7.  Granisetron 1 mg b.i.d.  8.  Fentanyl  patch 75 mcg 1 every 72 hours. 9.  Dronabinol 5 mg b.i.d.  10. Compazine 10 mg every 6 hours as needed.   REVIEW OF SYSTEMS:  CONSTITUTIONAL: No fever. Positive for fatigue, weakness.  EYES: No blurred or double vision, glaucoma or cataracts.  ENT: No tinnitus, ear pain, hearing loss or postnasal drip.  RESPIRATORY: No cough, wheeze, hemoptysis, painful respiration.  CARDIOVASCULAR: No chest pain, orthopnea, edema. No hypertension, no palpitations.  GASTROINTESTINAL: Positive for nausea and vomiting x 1. No GERD, abdominal pain or hemorrhoids.  GENITOURINARY: No dysuria, hematuria, or frequency.  ENDOCRINE: No polyuria, nocturia or thyroid problems.  HEMATOLOGY: No anemia or easy bruising.  SKIN: No acne, rash.  MUSCULOSKELETAL: No arthritis or hip pain. No cramps.  NEUROLOGIC: No CVA, TIA, or dementia.  PSYCHIATRIC: No anxiety or depression.   All other systems reviewed are negative.   PHYSICAL EXAMINATION:  GENERAL: The patient is awake, alert, oriented x 3, not in acute distress.  VITAL SIGNS: She is afebrile. Pulse is 101, blood pressure is 151/104. Saturation is 100% on room air.  HEENT: Atraumatic, normocephalic. PERRLA. EOMI. Oral mucosa is moist.  NECK: Supple. No JVD. No carotid bruit.  RESPIRATORY: Clear to auscultation bilaterally. No rales, rhonchi, respiratory distress or labored breathing.  HEART: Both the heart sounds are normal. Rate, rhythm regular. PMI not lateralized. Chest is nontender.  EXTREMITIES: Good pedal pulses, good femoral pulses. No lower extremity edema.  ABDOMEN: Soft, benign, nontender. No organomegaly. Positive bowel sounds.   NEUROLOGIC:  Intact cranial nerves II-XII. No motor or sensory deficit.  PSYCHIATRIC: The patient is awake, alert, oriented x 3.   LABORATORY DATA: CBC within normal limits, except platelet count is 81,000 and white count 3.2. Troponin less than 0.02. Comprehensive metabolic panel within normal limits, except albumin 2.9.    Urinalysis negative for urinary tract infection.   CHEST X-RAY: No acute cardiopulmonary abnormality other than persistent density throughout the right upper chest. Cannot exclude acute on chronic disease; however, there has not been significant change since previous exam.   ASSESSMENT: A 54 year old with history of metastatic lung cancer, comes in with:  1.  Intractable nausea with poor p.o. intake and weakness. We will admit her for overnight observation and IV fluids will be given to her. Regular diet. Fluids for hydration. Continue her p.r.n. Zofran, Phenergan, and dronabinol as needed.  2.  Metastatic lung cancer with bony metastases. The patient currently is on maintenance chemotherapy. She will follow up with Dr. Grayland Ormond as an outpatient.  3.  Chronic pain with narcotic dependence. The patient is on Dilaudid, which we will continue along with fentanyl patch.  4.  DVT prophylaxis. SCDs and TEDs.  I will avoid antiplatelet agents given low platelets.   The patient is a full code. I spoke with the patient and her brother who was in the Emergency Room.   TIME SPENT: 50 minutes.    ____________________________ Hart Rochester Posey Pronto, MD sap:lt D: 09/27/2013 15:11:10 ET T: 09/27/2013 15:34:40 ET JOB#: [id number]  cc: Tayler Lassen A. Posey Pronto, MD, <Dictator> Ilda Basset MD ELECTRONICALLY SIGNED 10/08/2013 17:04

## 2014-04-24 NOTE — Discharge Summary (Signed)
PATIENT NAME:  [name], HARBOUR MR#:  [id number] DATE OF BIRTH:  09-28-1960  DATE OF ADMISSION:  06/12/2013 DATE OF DISCHARGE:  06/18/2013  PRIMARY CARE PHYSICIAN:  Dr. Grayland Ormond.  FINAL DIAGNOSES: 1.  Persistent nausea, vomiting.  2.  Hypokalemia, hypomagnesemia.  3.  Pneumonia, postobstructive.  4.  Metastatic lung cancer.   MEDICATIONS ON DISCHARGE:  Include promethazine 12.5 mg 1 tablet every six hours as needed for nausea, vomiting, multivitamin 1 tablet daily, omeprazole 20 mg daily, biotin 1000 mcg daily, vitamin B12 500 mcg daily, Colace 100 mg twice a day, Compazine 10 mg every six hours as needed for nausea, MS Contin 30 mg extended-release 1 tablet every 12 hours, acetaminophen oxycodone 5/325 2 tablets every four hours as needed, Kytril 1 mg every 12 hours as needed for nausea, Marinol 5 mg twice a day, Megace 10 mL twice a day, Ensure Clear 200 mL 4 times a day.   DIET:  Regular diet, regular consistency.   ACTIVITY:  As tolerated.   FOLLOW-UP:  With Dr. Grayland Ormond in 1 to 2 weeks.   HOSPITAL COURSE:  The patient was admitted 06/12/2013, discharged 06/18/2013, came in with persistent nausea, shortness of breath and cough.  The patient was recently discharged from the hospital with healthcare-associated pneumonia.  The patient was put on Zosyn and Levaquin, IV fluids and nausea medications.    LABORATORY AND RADIOLOGICAL DATA DURING THE HOSPITAL COURSE:  EKG showed a sinus tachycardia, left atrial enlargement, incomplete right bundle branch block.  Glucose 77, BUN 6, creatinine 0.86, sodium 137, potassium 3.7, chloride 106, CO2 24, calcium 8.7.  Liver function tests normal range.  Albumin 3.2.  White blood cell count 3.2, hemoglobin and hematocrit 9.0 and 26.4, platelet count of 70.  Troponin negative.  Urinalysis negative.  Chest x-ray, stable to worsened irregular opacities within the right perihilar region and right upper lobe, possible superimposed infection.  CT scan of the abdomen  and pelvis showed small right pleural effusion and pericardial effusion, similar to previous study.  Fatty infiltration of the liver.  Large stone in the lower pole of the right kidney without obstruction.  Focal scarring in both kidneys.  No inflammation.  Probable metastatic lesion right proximal femur.  Stool for C. difficile was negative.  White blood cell count upon discharge 2.2, hemoglobin 8.8, hematocrit 25.6, platelet count of 173.  Glucose 92, BUN 5, creatinine 0.71, sodium 140, potassium 3.8, chloride 106, CO2 26, calcium 8.1, magnesium 2.0.   HOSPITAL COURSE PER PROBLEM LIST:  1.  For the patient's persistent nausea, this lasted during the entire hospital course.  I started Kytril IV, Marinol and Dr. Oliva Bustard started Megace.  She felt better the evening prior to discharge and tolerated food and was feeling well on the morning of discharge.  I did write her scripts for the Kytril, Marinol and Megace.  Prescribed Ensure Clear.  I told her that if she does get nauseous with the chemotherapy to call Dr. Gary Fleet office and they will be able to bring her in for IV fluid hydration and IV Kytril to try to avoid further hospitalizations.  2.  Hypokalemia and hypomagnesemia.  This was secondary to not eating.  Electrolytes were replaced during the hospital stay the entire hospital stay.  Since she is eating no need for electrolyte replacement.  3.  Pneumonia, postobstructive.  The patient had treatment last hospitalization and at home and this hospitalization finished a complete 10 day course of antibiotics.  No further antibiotics needed.  4.  Metastatic lung cancer to bone.  Overall prognosis is poor.   5.  CODE STATUS:  THE PATIENT IS A FULL CODE.   Close clinical follow-up with Dr. Grayland Ormond as outpatient.  The patient has not tolerated the chemotherapy very well and likely will have a rocky course with the chemotherapy.  Close clinical monitoring as outpatient through Dr. Gary Fleet office needed.   May end up needing IV fluids between chemotherapy sessions.  The patient did respond well to the Kytril, Megace and Marinol.   Time spent on discharge 35 minutes.    ____________________________ Tana Conch. Leslye Peer, MD rjw:ea D: 06/18/2013 15:20:13 ET T: 06/18/2013 23:53:32 ET JOB#: [id number]  cc: Tana Conch. Leslye Peer, MD, <Dictator> Kathlene November. Grayland Ormond, MD Marisue Brooklyn MD ELECTRONICALLY SIGNED 06/19/2013 16:18

## 2014-04-25 ENCOUNTER — Emergency Department
Admit: 2014-04-25 | Disposition: A | Discharge status: Home / Self Care | Payer: Self-pay | Admitting: Emergency Medicine

## 2014-04-25 LAB — COMPREHENSIVE METABOLIC PANEL
AST: 14 U/L — AB
Albumin: 3.2 g/dL — ABNORMAL LOW
Alkaline Phosphatase: 49 U/L
Anion Gap: 7 (ref 7–16)
BUN: 34 mg/dL — AB
Bilirubin,Total: 0.2 mg/dL — ABNORMAL LOW
CO2: 27 mmol/L
Calcium, Total: 8.6 mg/dL — ABNORMAL LOW
Chloride: 105 mmol/L
Creatinine: 0.64 mg/dL
Glucose: 99 mg/dL
Potassium: 3.6 mmol/L
SGPT (ALT): 14 U/L
SODIUM: 139 mmol/L
TOTAL PROTEIN: 5.5 g/dL — AB

## 2014-04-25 LAB — CBC
HCT: 32.6 % — ABNORMAL LOW (ref 35.0–47.0)
HGB: 10.7 g/dL — AB (ref 12.0–16.0)
MCH: 29.5 pg (ref 26.0–34.0)
MCHC: 32.8 g/dL (ref 32.0–36.0)
MCV: 90 fL (ref 80–100)
PLATELETS: 175 10*3/uL (ref 150–440)
RBC: 3.62 10*6/uL — ABNORMAL LOW (ref 3.80–5.20)
RDW: 17.9 % — AB (ref 11.5–14.5)
WBC: 9.6 10*3/uL (ref 3.6–11.0)

## 2014-04-25 LAB — LIPASE, BLOOD: LIPASE: 26 U/L

## 2014-04-26 ENCOUNTER — Other Ambulatory Visit: Payer: Self-pay | Admitting: Oncology

## 2014-04-26 LAB — SURGICAL PATHOLOGY

## 2014-04-28 NOTE — Consult Note (Signed)
PATIENT NAME:  [name], [name] MR#:  [id number] DATE OF BIRTH:  1960/04/08  DATE OF CONSULTATION:  12/31/2013  REFERRING PHYSICIAN:   CONSULTING PHYSICIAN:  Jase Himmelberger D. Clayborn Bigness, MD  PRIMARY CARE PHYSICIAN:  Dr. Grayland Ormond.    REFERRING PHYSICIAN:  Dr. Fritzi Mandes.    REASON FOR CONSULTATION:  Shortness of breath, pneumonia, and possible pericardial effusion.    HISTORY OF PRESENT ILLNESS:  The patient is a 54 year old white female recently diagnosed with lung cancer about a year ago, appeared to be stage IV. She was recently in the hospital with shortness of breath, pneumonia hospital-acquired.  The patient was treated for lung cancer by Dr. Grayland Ormond with chemotherapy in the past, she had increasing shortness of breath recently and came back to the Emergency Room.  The patient complained of shortness of breath, some chills, mild sweats, weakness, fatigue. Chest x-ray suggested worsened pneumonia, so she was admitted for further evaluation. A few months ago the patient had an episode of shortness of breath and echocardiogram which showed a large pericardial effusion. She was transferred to a tertiary care center for drainage, but with the shortness of breath today she presents for further evaluation.   PAST MEDICAL HISTORY:  1.  Stage IV lung cancer, adenocarcinoma with bone metastasis.  2.  Chemotherapy.   3.  GERD.   4.  Esophagitis.   5.  Nausea and vomiting which is chronic.  6.  History of pericardial tamponade status post tap and drainage.   7.  Remote tobacco abuse.    SOCIAL HISTORY: Quit smoking 18 years ago. Occasional alcohol use. Good family support.    CODE STATUS:  Full code.    FAMILY HISTORY: Lung cancer in her mother.   ALLERGIES: None.    MEDICATIONS: Zofran 8 mg every 8 hours as needed, valacyclovir 1 gram every 8 hours, sucralfate 1 gram 4 times a day, Senokot twice a day, omeprazole 20 mg a day, lidocaine patch to affected area as needed, hydromorphone 2 mg every 8 hours,  granisetron 1 mg every 12 hours, gabapentin 300 mg 3 times a day, fentanyl patch 75 mg every 3 days, dronabinol 5 mg twice a day, Augmentin 875 twice a day.   REVIEW OF SYSTEMS: Denies blackout spells, syncope. She has had chronic nausea and vomiting. No significant diarrhea.  She has had mild weight loss.  No weight gain. No hemoptysis, hematemesis, blood per rectum. Denies No sputum production, recent  weakness, fatigue, cough, congestion, with shortness of breath.   PHYSICAL EXAMINATION:  VITAL SIGNS: Blood pressure was 120/80, pulse of 95, respiratory rate of 16, afebrile.  HEENT: Normocephalic, atraumatic. Pupils are equal and reactive to light.  NECK:  Supple. No evidence of JVD.   LUNGS: Bilateral rhonchi, breath sounds, dullness on the left side. No rales.  HEART: Tachycardic, regular. Systolic ejection murmur at the apex.  ABDOMEN: Benign.  EXTREMITIES: Within normal limits.  NEUROLOGIC: Intact.  SKIN: Normal with mild alopecia of the scalp.   LABORATORY DATA: CBC within normal limits.  Lactic acid 1.1. Blood cultures negative. BNP normal except for potassium which is low at 3.2. Albumin 3.0. Chest x-ray shows widespread interstitial pulmonary density, possible edema, lymphangitic tumor, or interstitial pneumonia with bilateral effusion right greater than left.    ASSESSMENT:   1.  Pneumonia.   2.  Stage IV lung cancer.  3.  Gastroesophageal reflux disease.   4.  Shortness of breath.  5.  Hypokalemia.  6.  History of pericardial effusion.  7.  Pleural effusion.    PLAN:  1.  Recommend conservative medical therapy from a cardiac standpoint, but aggressive pulmonary and pneumonia therapy with broad spectrum antibiotics, pulmonary input, possible postobstructive pneumonia.  Consult ID for aggressive therapy.  2.  Chemotherapy treatment for lung cancer as per oncology.  3.  Continue treatment for chronic esophagitis and vomiting systematically.  4.  Maintain adequate hydration  intravenously if necessary.  5.  DVT prophylaxis.  6.  Replace potassium.  7.  Agree with echocardiogram to evaluate for pericardial effusion.  8.  We will treat the patient conservatively medically from a cardiac standpoint if there is no significant pericardial effusion or tamponade at this stage.     ____________________________ Loran Senters Clayborn Bigness, MD ddc:bu D: 12/31/2013 14:26:19 ET T: 12/31/2013 16:12:28 ET JOB#: [id number]  cc: Katlen Seyer D. Clayborn Bigness, MD, <Dictator> Yolonda Kida MD ELECTRONICALLY SIGNED 01/21/2014 13:00

## 2014-04-28 NOTE — Consult Note (Signed)
PATIENT NAME:  [name], [name] MR#:  [id number] DATE OF BIRTH:  08-Nov-1960  DATE OF CONSULTATION:  12/31/2013   CONSULTING PHYSICIAN:  Allyne Gee, MD   HISTORY OF PRESENT ILLNESS:  A 54 year old female with past medical history significant for metastatic Stage IV lung cancer. The patient comes in because of increasing shortness of breath. She had been recently admitted with a diagnosis of pneumonia and discharged on 12/23/2013, and was started on antibiotics, sent home with Augmentin.  She comes in now with increasing shortness of breath generally not feeling well, has had no hemoptysis noted. No chest pain noted.  She admitted to having chills and possibly a fever. The patient has had no abdominal complaints.   PAST MEDICAL HISTORY: Significant for gastroesophageal reflux, chronic nausea, vomiting history of pericardial tamponade and tobacco use in the past, about 19 years ago, she quit.   SOCIAL HISTORY: As mentioned, quit about 19 years ago. No alcohol or drug use.   FAMILY HISTORY: Positive for lung cancer.   ALLERGIES: Negative for any drugs.   MEDICATIONS: Reviewed on the electronic medical record.   REVIEW OF SYSTEMS:  A complete 12-point review of system was done and is unremarkable other than what was noted above in the history of present illness.   PHYSICAL EXAMINATION:  GENERAL: At the time the patient was seen, the patient was awake, alert, comfortable. VITAL SIGNS:  Afebrile. Temperature is 98.2, pulse 89, respiratory rate 20, blood pressure 116/78, saturating 90%.  NECK: Supple. No JVD. No adenopathy. No thyromegaly.  CHEST: No rales or rhonchi. Expansion was equal.  CARDIOVASCULAR: S1, S2 is normal. Regular rhythm. No gallop or rub.  ABDOMEN: Soft, nontender.  NEUROLOGIC: Awake and alert, moving all 4 extremities.  EXTREMITIES: Without cyanosis, clubbing, or edema is noted.  SKIN: No acute rashes.  MUSCULOSKELETAL: Without any active synovitis.   LABORATORY DATA:  Echo  was done which showed a small posterior pericardial effusion and some systolic function was 78% to 60% with some mild relaxation abnormalities noted. She had a sodium of 144, potassium 3.2, BUN 5, creatinine 0.73, glucose was 94.  The patient's x-ray that was done was of some concern for interstitial pulmonary density which could be a possibility of lymphangitic tumor spread and interstitial pneumonia.   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   PLAN: The patient has been seen by Dr. Ola Spurr from infectious diseases for a concern of infectious etiology and the feeling was possibly considering doing a bronchoscopy because of the interstitial density. She had a CT scan done last time which was similar.  I would suggest repeating a CT to see if things are worse.  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 We will await a follow-up CT, continue with antibiotics and follow up cultures possible as mentioned, BAL can be done next week.  We will make further recommendations as needed.  Prognosis overall is quite guarded.      ____________________________ Allyne Gee, MD sak:DT D: 12/31/2013 13:33:41 ET T: 12/31/2013 13:49:31 ET JOB#: [id number]  cc: Allyne Gee, MD, <Dictator> Allyne Gee MD ELECTRONICALLY SIGNED 01/01/2014 12:38

## 2014-04-28 NOTE — H&P (Signed)
PATIENT NAME:  [name], [name] MR#:  [id number] DATE OF BIRTH:  [birth year]  DATE OF ADMISSION:  12/29/2013  PRIMARY ONCOLOGIST: Kathlene November. Grayland Ormond, MD  CHIEF COMPLAINT: Increasing shortness of breath, cough and not feeling well.   HISTORY OF PRESENT ILLNESS: [name] is a pleasant 54 year old Caucasian female who was recently admitted on December 20, discharged on December 23 with p.o. Augmentin with pneumonia, HCAP. The patient has history of lung cancer, follows with Dr. Grayland Ormond. She comes back to the Emergency Room with increasing shortness of breath, not feeling well and nausea with poor p.o. intake. In the Emergency Room, she appeared to be having chills, felt warm, however, she did not have any temperature. She was being admitted for further evaluation on her shortness of breath and cough with possible pneumonitis. She received IV antibiotics.   PAST MEDICAL HISTORY:  1.  Stage IV right-sided lung adenocarcinoma with bone metastasis, undergoing chemotherapy at the [facility]  2.  Gastroesophageal reflux disease. 3.  Esophagitis.  4.  Chronic nausea and vomiting.  5.  History of pericardial tamponade.  6.  Remote tobacco abuse.   SOCIAL HISTORY: She used to smoke, quit about 18 years back, occasional alcohol use, ambulates on her own at baseline.   CODE STATUS: Full code.   FAMILY HISTORY: Positive for lung cancer in her mother.   ALLERGIES: No known drug allergies.   HOME MEDICATIONS:  1.  Zofran 8 mg every 8 hourly.  2.  Valacyclovir 1 gram every 8 hours.  3.  Sucralfate 1 gram 10 mL 4 times a day.  4.  Senokot 8.6 mg 1 b.i.d.  5.  Omeprazole 20 mg p.o. daily.  6.  sucralfate10 cc every 6 hourly.  7.  Lidocaine topical film, apply to effected area.  8.  Hydromorphone 2 mg 1 tablet every 4 hours.  9.  Granisetron 1 mg every 12 hourly.  10.  Gabapentin 300 mg 1 tablet t.i.d.  11.  Fentanyl 75 mcg per hour, 1 patch transdermal every 72 hours.  12.  Dronabinol 5 mg b.i.d.   13.  Augmentin 1 tablet b.i.d.; this was started on discharge on December 23, the patient still has a few days' worth of medication.  REVIEW OF SYSTEMS: CONSTITUTIONAL: Positive for chills, fatigue, weakness.  EYES: No blurred or double vision, glaucoma or cataracts.  ENT: No tinnitus, ear pain, hearing loss.  RESPIRATORY: Positive for cough, shortness of breath.  CARDIOVASCULAR: No chest pain, orthopnea, edema.  GASTROINTESTINAL: Positive for chronic nausea, vomiting and abdominal discomfort.  GENITOURINARY: No dysuria, hematuria, frequency.  ENDOCRINE: No polyuria, nocturia or thyroid problems.  HEMATOLOGY: No anemia or easy bruising.  SKIN: No acne, rash or lesion.  MUSCULOSKELETAL: Positive for chronic pain, bone pain secondary to cancer. No swelling or gout.  NEUROLOGIC: No CVA, TIA or dementia.  PSYCHIATRIC: No anxiety, depression or bipolar disorder.   All other systems reviewed and negative.   PHYSICAL EXAMINATION:  GENERAL: The patient is awake, alert, oriented x 3, appears very weak. VITAL SIGNS: Temperature 97.4, pulse is 100, blood pressure is 116/81. Saturations are 94% on room air.  HEENT: Atraumatic, normocephalic. PERRLA. EOM intact. Oral mucosa is dry.  NECK: Supple. No JVD. No carotid bruit.  LUNGS: Clear to auscultation bilaterally. No rales, rhonchi, respiratory distress or labored breathing.  CARDIOVASCULAR: Both heart sounds are normal. Rate, rhythm regular. PMI not lateralized. Chest nontender.  EXTREMITIES: Good pedal pulses, good femoral pulses. No lower extremity edema.  ABDOMEN: Soft, benign, nontender.  No organomegaly. Positive bowel sounds.  NEUROLOGIC: The patient appears overall weak, appears nonfocal, was too weak to participate; however, she has nonfocal neurological examination.  SKIN: Warm and dry.  PSYCHIATRIC: The patient is awake, alert, oriented x 3.   LABORATORY DATA: CBC within normal limits. Lactic acid 1.1. Blood cultures negative so far.  Basic metabolic panel within normal limits except potassium of 3.2 and albumin of 3.0.   Chest x-ray shows no change, widespread interstitial pulmonary density. This could be a combination of edema, lymphangitic tumor and interstitial pneumonia. Bilateral effusions, right larger than left.   ASSESSMENT: A 54 year old, [name], with history of stage IV adenocarcinoma of the lung undergoing chemotherapy along with history of gastroesophageal reflux disease and esophagitis comes in with:  1.  Progressive increasing shortness of breath with abnormal chest x-ray, recently treated for healthcare-associated pneumonia. We will admit the patient again. This could be lymphangitic spread of the tumor versus increasing pleural effusion. We will admit the patient to the medical floor, start her on vancomycin and meropenem. Dr. Ola Spurr has been consulted. I have already spoken with him. We will continue oxygen and nebulizer treatment.  2.  Stage IV adenocarcinoma. The patient is scheduled for chemotherapy today; however, she is not able to withstand it. It needs to be postponed if she is going to be in the hospital.  3.  Chronic esophagitis and vomiting. Continue her nausea medicine from home and add IV p.r.n. medications here in the hospital.  4.  Deep venous thrombosis prophylaxis with heparin subcutaneous t.i.d.   Above was discussed with Dr. Ola Spurr, who will see the patient from infectious disease standpoint. The patient patient's code status, full code.   TIME SPENT: Fifty minutes.    ____________________________ Hart Rochester Posey Pronto, MD sap:TT D: 12/29/2013 20:31:07 ET T: 12/29/2013 21:06:05 ET JOB#: [id number]  cc: Madelene Kaatz A. Posey Pronto, MD, <Dictator> Ilda Basset MD ELECTRONICALLY SIGNED 01/05/2014 11:12

## 2014-04-28 NOTE — Consult Note (Signed)
PATIENT NAME:  [name], [name] MR#:  [id number] DATE OF BIRTH:  01/13/1960  DATE OF CONSULTATION:  12/29/2013  REFERRING PHYSICIAN:  Helene Kelp L. Derrel Nip, MD  CONSULTING PHYSICIAN:  Cheral Marker. Ola Spurr, MD  INFECTIOUS DISEASE CONSULTATION:  REASON FOR CONSULTATION:  Recurring pneumonia in a patient with metastatic stage IV lung cancer.   HISTORY OF PRESENT ILLNESS:  This is a pleasant but unfortunate 54 year old female with approximately 1-year history of stage IV adenocarcinoma of the lung with bony metastases, undergoing chemotherapy, who was recently readmitted today after being discharged 1 week ago with a diagnosis of pneumonia. In the past, she has been treated with cheme  and most recently with nivolumab. She had a CT scan done in November that showed likely progression of disease, which is when she started her second-line therapy with nivolumab. She was admitted December 20 initially with complaints of shortness of breath and cough and weakness. She was treated initially during that hospitalization with IV antibiotics. Clinically, she improved and was discharged on December 23 on oral Augmentin. She, however, had progressive chronic cough as well as some nausea and vomiting, and she was admitted today. She has been having some chills as well. She describes her cough as relatively nonproductive. She has not had any high-grade fevers.    PAST MEDICAL HISTORY:   1.  Stage IV lung cancer with bony metastases.   2.  Gastroesophageal reflux. 3.  Esophagitis.  4.  Chronic nausea and vomiting.  5.  History of pericardial tamponade.  6.  Prior tobacco abuse.   SOCIAL HISTORY:  Quit smoking 18 years ago. Occasional alcohol.   FAMILY HISTORY:  Positive for lung cancer in her mother.   ALLERGIES:  No known drug allergies.  REVIEW OF SYSTEMS:  Eleven systems reviewed and negative except as per HPI.   ANTIBIOTICS:  Currently include vancomycin and meropenem. Of note, she was discharged on Augmentin  recently.   PHYSICAL EXAMINATION: VITAL SIGNS:  Temperature 97.4, pulse 100, blood pressure 116/81, respirations 20, saturation 94% on room air.  GENERAL:  She is well developed, in no acute distress, not wearing oxygen.  HEENT:  Pupils are reactive. Oropharynx is clear with no thrush or lesions.  NECK:  Supple.  HEART:  Regular.  LUNGS:  Have rhonchi bilaterally at the bases and some poor air movement.  ABDOMEN:  Soft, nontender, nondistended. No hepatosplenomegaly.  EXTREMITIES:  No clubbing, cyanosis, or edema.  VASCULAR ACCESS:  She has a Port-A-Cath in her right upper chest.  NEUROLOGIC:  She is alert and oriented x 3. Grossly nonfocal neurologic exam.   IMAGING DATA:  CT of the chest done December 20 on her prior admission and compared with prior imaging on November 12 showed no evidence of a PE. There are bilateral free-flowing pleural effusions. There are scattered areas of lung scarring. There is a spiculated lesion in the anterior segment of the left lower lobe measuring 2.5 x 2.2. This has increased in size. There is an area of peribronchial thickening, which is new. There is an area of new irregular consolidation in the posterior segment of the right upper lobe measuring 2.2 x 1.5 cm, which represents lung consolidation versus mass. There are innumerable 1 to 2 mm nodular lesions throughout the lung suggestive of lymphangitic spread of tumor. There is underlying lower lobe bronchiectatic change bilaterally. There is increase in peribronchial thickening throughout the right upper lobe compared to recent prior study, likely due to either progressive lymphangitic spread of tumor or posttreatment  response. There may be some peripheral mucous plugging in this area. There is no appreciable thoracic adenopathy. Overall, there seems to be increased size in the mass in the right lower lobe. There is a new area of probable pneumonia in the posterior segment of the right upper lobe given the rapid  emergence, and there is probable lymphangitic spread of tumor diffusely.   LABORATORY DATA:  White blood count currently is 5.0, hemoglobin 12.8, platelets 220,000. On December 21, white count was low at 2.3, hemoglobin 10.9, platelets 139,000. Renal function shows a creatinine of 0.73. LFTs are normal except albumin low at 3.0. BNP is only 53. Blood cultures x 2 are negative from December 20 as well as December 29.   IMPRESSION:  This is a 54 year old with stage IV adenocarcinoma of the lung, undergoing treatment, who has recurrent admission for increased shortness of breath. CT scan shows compressive lesions throughout the lungs consistent with either pneumonitis or lymphangitic spread of tumor. She has a normal white blood count and has not had fevers. Blood cultures have been negative, but no sputum samples have been obtained. She has failed outpatient management after her most recent discharge.   This could be routine postobstructive pneumonia, although I would expect a higher fever and white count with that. There are other atypical causes that could occur with her immunosuppression including fungal infections and Pneumocystis pneumonia. I also suspect potential pneumonitis related to her chemotherapy with nivolumab, which has been noted to cause pneumonitis.   RECOMMENDATIONS: 1.  Continue current antibiotics.  2.  Obtain sputum via induced cough if needed.  3.  Consider bronchioalveolar lavage to evaluate these lesions. How aggressive to be depends on her prognosis overall with the tumor. However, if she has pneumonitis related to her chemotherapy, she may benefit from higher doses of steroids and stopping of her current treatment. Also, she has some atypical infection. It would be good to identify that at this point.   Thank you for the consult. I will be glad to follow with you.    ____________________________ Cheral Marker. Ola Spurr, MD dpf:nb D: 12/29/2013 21:53:25 ET T: 12/29/2013  22:54:19 ET JOB#: [id number]  cc: Cheral Marker. Ola Spurr, MD, <Dictator> Adna Nofziger Ola Spurr MD ELECTRONICALLY SIGNED 01/03/2014 21:28

## 2014-05-02 NOTE — H&P (Signed)
PATIENT NAME:  [name] MR#:  [id number] DATE OF BIRTH:  25-Mar-1960  DATE OF ADMISSION:  04/11/2014  REFERRING EMERGENCY ROOM PHYSICIAN: Ferman Hamming, MD   PRIMARY CARE PHYSICIAN: Delight Hoh, MD, oncology.   CHIEF COMPLAINT: Nausea, vomiting, and abdominal pain.   HISTORY OF PRESENT ILLNESS: This 54 year old female with history of lung cancer metastatic to bone and with lymphangitic spread, followed by Dr. Grayland Ormond, presents with vomiting, diarrhea, and abdominal pain, which started this morning.  She reports that yesterday she has had nausea, but was able to tolerate food. This morning, she woke up at 3:00 a.m. with diarrhea and has had approximately 8 episodes of voluminous watery stools since that time, no hematochezia or melena.  At about 5:00 a.m. she developed vomiting, and has had at least 4 episodes of watery emesis. No hematemesis.  She has abdominal pain in the umbilical area which is described as a sharp shooting pain without radiation, about a 6 to 7 on the pain scale. She has had diaphoresis, but no fevers. She has been tolerating food up until today. She eats mainly salads, last ate yesterday. She denies any sick contacts. She has had multiple admissions over the past 6 months, and the past 2 have been for similar symptoms.   PAST MEDICAL HISTORY:  1.  Stage IV metastatic lung cancer with bony metastases and lymphangitic spread.  2.  Depression.  3.  Chronic obstructive pulmonary disease.  4.  Gastroesophageal reflux disease.  5.  Chronic diastolic congestive heart failure.  6.  History of esophagitis.  7.  History of pericardial tamponade.  SOCIAL HISTORY: She lives alone in her own home. She does not smoke cigarettes, drink alcohol, or use any illicit substances.   PAST SURGICAL HISTORY:   Port-A-Cath placement, right lung biopsy, right-sided video-assisted thoracotomy, bladder surgery at age 60.    FAMILY HISTORY: Positive for coronary artery disease.    ALLERGIES: No known drug allergies.   HOME MEDICATIONS:  1.  Scopolamine 1.5 mg transdermal patch every 72 hours.  2.  Promethazine 25 mg 1 tablet every 6 hours as needed for nausea.  3.  Polyethylene glycol 3350 oral powder 17 grams once a day as needed for constipation.  4.  Ondansetron 8 mg 1 tablet orally every 8 hours as needed for nausea.  5.  Omeprazole 20 mg 1 capsule once a day.  6.  Metoprolol tartrate 25 mg 1 tablet 3 times a day.  7.  Lidocaine 5% topical film, apply 2 patches topically to affected area once a day.  8.  Klor-Con 10 mEq 4 tablets twice a day.  9.  Hydromorphone 4 mg 1 tablet every 4 hours as needed for severe pain.  10. Gabapentin 300 mg 1 capsule orally 3 times a day.  11. Furosemide 20 mg 1 tablet twice a day.  12. Fentanyl 100 mcg per hour, 1 patch transdermally every 72 hours.  13. Dronabinol 5 mg oral tablet, 1 capsule twice a day.  14. DexPak 6 day taper 1.5 mg; 6 tablets on day 1 decreasing by 1 tablet daily and then stop, this medication was started on Friday 04/09/2014.   15. Combivent Respimat CFC-free 100 mcg/200 mcg 1 puff inhaled 4 times a day as needed for shortness of breath.   REVIEW OF SYSTEMS:  CONSTITUTIONAL: Positive for fatigue. No fevers. No change in weight.  HEENT: No change in vision or hearing. No pain in the eyes or ears. No sore throat, sinus congestion, or  difficulty swallowing.  RESPIRATORY: No shortness of breath, wheezing, cough, or hemoptysis. No painful respirations.  CARDIOVASCULAR: No chest pain, palpitation, orthopnea, or edema. No syncope.  GASTROINTESTINAL:  Positive as above for nausea, vomiting, diarrhea, without hematemesis, hematochezia, or melena.  Positive for abdominal pain as described above.  SKIN: No new rashes or lesions.  MUSCULOSKELETAL: No new pain in the neck, back, shoulders, knees, or hips.   NEUROLOGIC: No focal numbness or weakness. No confusion, syncope. No history of CVA or dementia.   PSYCHIATRIC:  No bipolar disorder or schizophrenia.   PHYSICAL EXAMINATION:  VITAL SIGNS: Temperature 98 degrees, pulse 93, respirations 18, blood pressure 114/78, oxygenation 96% on room air.  GENERAL: The patient is very uncomfortable, is in a mild distress.  HEENT: Pupils equal, round, and reactive to light. Conjunctivae clear, extraocular motion intact. Oral mucous membranes are pink and moist. Posterior oropharynx is clear with no exudate, edema, or erythema. Good dentition. No cervical lymphadenopathy. Trachea is midline.  RESPIRATORY: Lungs are clear to auscultation bilaterally with good air movement. No rhonchi, wheezes, or rales.  CARDIOVASCULAR: Regular rate and rhythm. No murmurs, rubs, or gallops. No peripheral edema. Peripheral pulses are 2+.  ABDOMEN: Diffusely tender, with the worst pain being in the mid epigastric area. Not distended. Bowel sounds are normal. She does have some guarding, no rebound, no hepatosplenomegaly, or mass.  SKIN: No rashes, lesions, or wounds noted.  MUSCULOSKELETAL: Range of motion is normal. No joint effusions. Strength is 5/5 throughout.  NEUROLOGIC: Cranial nerves II through XII grossly intact. Strength and sensation intact.  PSYCHIATRIC: She is very anxious, but is alert and oriented with good insight into her clinical conditions.   LABORATORY DATA: Sodium 139, potassium 3.1, chloride 105, bicarbonate 24, BUN 12, creatinine 0.62, glucose 75, calcium 8.3.  Lipase is 58, total protein 5.5, albumin 3.2. Other LFTs are normal. Troponin is less than 0.03. White blood cells 10.4, hemoglobin 12.9, platelets 336,000, MCV is 93.    IMAGING:  Plain film shows diffuse bilateral airspace opacities which are stable from prior examinations. There is no obstructive bowel gas pattern. She does have right-sided nephrolithiasis.  EKG shows a new right bundle branch block, otherwise no changes to suggest acute ischemia.   ASSESSMENT AND PLAN:  1.  Nausea, vomiting, and diarrhea with  abdominal pain: This is her third similar presentation. We will provide Phenergan IV fluids and pain control for symptom support. We will check Clostridium difficile and stool cultures for possible infectious colitis. She may have at this point a narcotic bowel syndrome or neuropathic gastroparesis. I have consulted gastroenterology for further assistance. I do not think this is a viral gastroenteritis. These symptoms may also be related to her current therapy for lung cancer. She was recently started on a steroid taper 2 days ago.  She has not received chemotherapy for 3 weeks at this point.  I will also consult oncology for further assistance with these symptoms.  2.  Hypokalemia:  We will check magnesium, replace potassium and continue to monitor.  3.  Lung cancer, stage IV, with lymphangitic spread and metastasis to bone: Continue with pain management. We will consult oncology for continuity and for rescheduling of her chemotherapy, which was to be this week.  4.  Hypothyroidism: I note that in her recent labs, she had an elevated TSH. I will recheck this today.  5.  Chronic obstructive pulmonary disease: No signs of exacerbation at this time. Continue with home inhalers.  6.  Gastroesophageal reflux  disease: Continue with proton pump inhibitors.  7.  Patient is a FULL CODE.   TIME SPENT ON ADMISSION: 45 minutes.     ____________________________ Earleen Newport. Volanda Napoleon, MD cpw:DT D: 04/11/2014 16:23:30 ET T: 04/11/2014 16:59:05 ET JOB#: [id number]  cc: Barnetta Chapel P. Volanda Napoleon, MD, <Dictator> Aldean Jewett MD ELECTRONICALLY SIGNED 04/12/2014 20:51

## 2014-05-02 NOTE — Consult Note (Signed)
Chief Complaint:  Subjective/Chief Complaint seen for anemia.  no evidence of GI bleeding overnight, hemodynamically stable.  tolerating po, no abd pain,  chronic nausea at baseline, no bm   VITAL SIGNS/ANCILLARY NOTES: **Vital Signs.:   07-Mar-16 12:53  Vital Signs Type Routine  Temperature Temperature (F) 98.3  Temperature Source oral  Pulse Pulse 119  Respirations Respirations 19  Systolic BP Systolic BP 562  Diastolic BP (mmHg) Diastolic BP (mmHg) 74  Mean BP 84  Pulse Ox % Pulse Ox % 95  Pulse Ox Activity Level  At rest  Oxygen Delivery 2L   Brief Assessment:  Cardiac Regular   Respiratory poor breath sounds  on right, somewhat diminished on left.   Gastrointestinal details normal Soft  Nontender  Nondistended  Bowel sounds normal   Lab Results: Routine Chem:  05-Mar-16 11:36   BUN  5  06-Mar-16 04:20   BUN 7  07-Mar-16 05:51   BUN 10  Routine Coag:  05-Mar-16 12:59   INR - (INR reference interval applies to patients on anticoagulant therapy. A single INR therapeutic range for coumarins is not optimal for all indications; however, the suggested range for most indications is 2.0 - 3.0. Exceptions to the INR Reference Range may include: Prosthetic heart valves, acute myocardial infarction, prevention of myocardial infarction, and combinations of aspirin and anticoagulant. The need for a higher or lower target INR must be assessed individually. Reference: The Pharmacology and Management of the Vitamin K  antagonists: the seventh ACCP Conference on Antithrombotic and Thrombolytic Therapy. [id number].5784 Sept:126 (3suppl): [id number]. A HCT value >55% may artifactually increase the PT.  In one study,  the increase was an average of 25%. Reference:  "Effect on Routine and Special Coagulation Testing Values of Citrate Anticoagulant Adjustment in Patients with High HCT Values." American Journal of Clinical Pathology 2006;126:400-405.)    17:45   INR 1.0 (INR reference  interval applies to patients on anticoagulant therapy. A single INR therapeutic range for coumarins is not optimal for all indications; however, the suggested range for most indications is 2.0 - 3.0. Exceptions to the INR Reference Range may include: Prosthetic heart valves, acute myocardial infarction, prevention of myocardial infarction, and combinations of aspirin and anticoagulant. The need for a higher or lower target INR must be assessed individually. Reference: The Pharmacology and Management of the Vitamin K  antagonists: the seventh ACCP Conference on Antithrombotic and Thrombolytic Therapy. [id number] Sept:126 (3suppl): [id number]. A HCT value >55% may artifactually increase the PT.  In one study,  the increase was an average of 25%. Reference:  "Effect on Routine and Special Coagulation Testing Values of Citrate Anticoagulant Adjustment in Patients with High HCT Values." American Journal of Clinical Pathology 2006;126:400-405.)  06-Mar-16 04:20   INR 1.1 (INR reference interval applies to patients on anticoagulant therapy. A single INR therapeutic range for coumarins is not optimal for all indications; however, the suggested range for most indications is 2.0 - 3.0. Exceptions to the INR Reference Range may include: Prosthetic heart valves, acute myocardial infarction, prevention of myocardial infarction, and combinations of aspirin and anticoagulant. The need for a higher or lower target INR must be assessed individually. Reference: The Pharmacology and Management of the Vitamin K  antagonists: the seventh ACCP Conference on Antithrombotic and Thrombolytic Therapy. XLKGM.0102 Sept:126 (3suppl): [id number]. A HCT value >55% may artifactually increase the PT.  In one study,  the increase was an average of 25%. Reference:  "Effect on Routine and Special Coagulation Testing Values of Citrate Anticoagulant Adjustment  in Patients with High HCT Values." American Journal of Clinical  Pathology 3729;021:115-520.)  Routine Hem:  05-Mar-16 12:59   Hemoglobin (CBC) -    15:00   Hemoglobin (CBC)  9.7    15:45   Hemoglobin (CBC)  11.0 (Result(s) reported on 06 Mar 2014 at 04:19PM.)  06-Mar-16 04:20   Hemoglobin (CBC)  9.0  07-Mar-16 05:51   Hemoglobin (CBC)  8.6   Assessment/Plan:  Assessment/Plan:  Assessment 1)  anemia-multifactorial.  heme negative on rectal yesterday.   history of metastatic lung ca. 2) bilateral pleural effusions, right >left   Plan 1) continue ppi 2) due to respiratory status, no plans for sedated luminal evaluation.   will sign off, reconsult if needed.   Electronic Signatures: Loistine Simas (MD)  (Signed 07-Mar-16 15:01)  Authored: Chief Complaint, VITAL SIGNS/ANCILLARY NOTES, Brief Assessment, Lab Results, Assessment/Plan   Last Updated: 07-Mar-16 15:01 by Loistine Simas (MD)

## 2014-05-02 NOTE — Discharge Summary (Signed)
PATIENT NAME:  [name], [name] MR#:  [id number] DATE OF BIRTH:  1960/11/06  DATE OF ADMISSION:  03/06/2014 DATE OF DISCHARGE:  03/10/2014  DISCHARGE DIAGNOSES:  1.  Acute on chronic hypoxic respiratory failure with a probable underlying pneumonia.  2.  Lung cancer with lymphangitic spread.  3.  Anemia.   CONSULTATIONS: Pulmonology, oncology, gastroenterology, palliative care.   PROCEDURES: None   BRIEF HISTORY AND HOSPITAL COURSE: The patient is a 54 year old Caucasian female who came into the ED with a chief complaint of shortness of breath. The patient was just discharged from the hospital 1 day before with the left lower extremity cellulitis and she was sent home on Keflex. Please review history and physical for details. She has stage IV metastatic lung cancer with bony metastases. The patient was profoundly hypoxic and also noticed to be very anemic. The patient's repeat hemoglobin was close to 9; no blood transfusions were provided. CAT scan of the chest suggested enlarged bilateral pleural effusions and possible pneumonia.   HOSPITAL COURSE BASED ON PROBLEM:  1.  Acute hypoxic respiratory failure secondary to enlarging pleural effusions and possible underlying pneumonia. The patient was admitted to the hospital. She was started on IV Solu-Medrol, nebulizer treatments, oxygen was continued on nasal cannula, IV Lasix was given. Initially, we thought the patient had profound anemia and thought of a transfusion of 2 units of packed red blood cells, but repeat hemoglobin was found to be at around 9. The blood transfusion was canceled. IV fluids were also started, which were eventually discontinued. The patient was continued on IV Solu-Medrol, IV antibiotics, and IV Lasix. Evaluated by pulmonology, Dr. Mortimer Fries, as well as oncology. Oncology has recommended to continue the current treatment with steroids and antibiotics and the patient is to follow up with them as an outpatient on March 10 for  chemotherapy. The patient's clinical situation significantly improved. Although she has lymphangitic spread and enlarging pleural effusions, clinically she was found to be stable. Thoracentesis is not considered, as the patient's hypoxic respiratory failure significantly improved with steroids and IV antibiotics. IV Lasix was continued and it was changed to p.o. Lasix. PT was consulted and reconditioning was provided. The patient was evaluated by palliative care and, at this point, she wants to continue her code status as full code. Steroids were slowly tapered.  2.  Stage IV metastatic lung cancer with enlarging pleural effusions. Recent open lung biopsy has revealed lymphangitic spread of the tumor, status post chemotherapy lately and repeat chemotherapy is recommended by oncology and this is scheduled for March 10.  3.  Anemia. Hemoglobin and hematocrit were monitored closely. The patient's initial hemoglobin was at 11. Subsequently, it trended down to 9 and 8.6. Her fecal occult blood test was negative. The patient was evaluated by GI. They thought it was probably from metastatic cancer as well as from recent chemotherapy. They have recommended no acute interventions at this time.  4.  Hypokalemia. Repleted with supplements.  5.  Anxiety and depression. Currently stable. The patient is in good spirits. Denies any suicidal or homicidal ideation. She has a strong family support.  6.  Reconditioning was provided with physical therapy. They have recommended home health with PT. The patient was ambulated and her pulse oximetry was 91% on 2 L of oxygen. The patient was discharged home under satisfactory condition on March 9.   PHYSICAL EXAMINATION:  VITAL SIGNS: Temperature 97.9, pulse 92, respirations 18-20, blood pressure 126/86, pulse oximetry 91%-98% on 2 L.  GENERAL APPEARANCE:  Not in any acute distress. Moderately built and nourished.  HEENT: Normocephalic, atraumatic. Pupils are equally reacting to  light and accommodation. No sclerae icterus. No conjunctival injection. No sinus tenderness. No postnasal drip. Moist mucous membranes.  NECK: Supple. No JVD. No thyromegaly. Range of motion is intact.  LUNGS: Moderate air entry. No wheezing. Decreased breath sounds at the bases.  CARDIAC: S1, S2 normal. Regular rate and rhythm, tachycardic.  ABDOMEN: Soft. Bowel sounds are positive in all 4 quadrants. Nontender, nondistended. No masses felt.  NEUROLOGIC: Awake, alert, oriented x 3. Cranial nerves II-XII are grossly intact. Motor and sensory are intact. Reflexes are 2+.  EXTREMITIES: No cyanosis. No clubbing.  SKIN: Warm to touch. Normal turgor. No rashes. No lesions.  MUSCULOSKELETAL: No joint effusion, tenderness, or erythema.  PSYCHIATRIC: Normal mood and affect.   LABORATORY AND IMAGING STUDIES: On March 7, her BMP is normal. March 7: WBC 4.8, hemoglobin 8.6, hematocrit 27.2, platelets are 278,000.    MEDICATIONS AT THE TIME OF DISCHARGE: Granisetron 1 mg 1 tablet p.o. every 12 hours; lidocaine 5% apply topically to affected area once daily; senna 8.6 mg 1 tablet p.o. 2 times a day; sucralfate 10 mL p.o. 4 times a day with meals and at bedtime; amitriptyline 25 mg 1 tablet p.o. once daily at bedtime; scopolamine patch applied topically to affected area every 3 days; olanzapine 2.5 mg 1 tablet p.o. once daily; gabapentin 300 mg 1 tablet p.o. 3 times a day; omeprazole 20 mg 1 capsule p.o. once daily; hydromorphone 4 mg 1 tablet p.o. every 4 hours as needed for severe pain, prescription was provided for hydromorphone, provided 20 tablets; dronabinol 10 mg 1 capsule p.o. 2 times a day; fentanyl 100 mcg/hour 1 patch transdermally every 72 hours; Combivent 1 puff inhalation 4 times a day as needed; polyethylene glycol 1 capful in 8 ounces of fluid drink and drink orally once a day as needed for constipation; Zofran 8 mg 1 tablet p.o. every 8 hours;  metoprolol 37.5 mg p.o. 2 times a day; prednisone 10 mg,  start with 60 mg once daily for 2 days, taper by 10 mg every 2 days until the tablets are completed; Augmentin 500/125 one tablet p.o. every 8 hours; Florastor 250 mg 1 capsule p.o. 2 times a day; furosemide 20 mg 1 tablet p.o. 2 times a day; potassium chloride 20 mEq 1 tablet p.o. once daily. Medications discontinued are Keflex and promethazine.   SERVICES: Discharged home with home health with physical therapy and nurse as well as nurse aide. Continue oxygen 2 L via nasal cannula.   DIET: Regular. Regular consistency.   FOLLOWUP: Primary care physician in 1 week. Follow up with Dr. Grayland Ormond 1 day after discharge on March 10 for chemotherapy.   Plan of care was discussed in detail with the patient. She verbalized understanding of the plan.   TOTAL TIME SPENT ON DISCHARGE AND COORDINATION OF CARE: 40 minutes.    ____________________________ Nicholes Mango, MD ag:bm D: 03/11/2014 21:21:31 ET T: 03/12/2014 07:00:12 ET JOB#: [id number]  cc: Nicholes Mango, MD, <Dictator> Kathlene November. Grayland Ormond, MD Primary care physician  Nicholes Mango MD ELECTRONICALLY SIGNED 03/22/2014 23:03

## 2014-05-02 NOTE — Consult Note (Signed)
Chief Complaint:  Subjective/Chief Complaint Diarrhea resolved. No BM. Thus, no stool for c.diff. Nausea persists. So far, no diffrence with PPI bid.   VITAL SIGNS/ANCILLARY NOTES: **Vital Signs.:   11-Apr-16 06:04  Vital Signs Type Routine  Temperature Temperature (F) 98  Celsius 36.6  Pulse Pulse 88  Respirations Respirations 20  Systolic BP Systolic BP 716  Diastolic BP (mmHg) Diastolic BP (mmHg) 92  Mean BP 102  Pulse Ox % Pulse Ox % 96  Pulse Ox Activity Level  At rest  Oxygen Delivery 2L   Brief Assessment:  GEN no acute distress   Cardiac Regular   Respiratory clear BS   Gastrointestinal diffuse tenderness   Lab Results: Routine Chem:  11-Apr-16 01:56   Glucose, Serum 81 (65-99 NOTE: New Reference Range  03/09/14)  BUN 9 (6-20 NOTE: New Reference Range  03/09/14)  Creatinine (comp) 0.62 (0.44-1.00 NOTE: New Reference Range  03/09/14)  Sodium, Serum 142 (135-145 NOTE: New Reference Range  03/09/14)  Potassium, Serum  3.4 (3.5-5.1 NOTE: New Reference Range  03/09/14)  Chloride, Serum 109 (101-111 NOTE: New Reference Range  03/09/14)  CO2, Serum 27 (22-32 NOTE: New Reference Range  03/09/14)  Calcium (Total), Serum  8.0 (8.9-10.3 NOTE: New Reference Range  03/09/14)  Anion Gap  6  eGFR (African American) >60  eGFR (Non-African American) >60 (eGFR values <61m/min/1.73 m2 may be an indication of chronic kidney disease (CKD). Calculated eGFR is useful in patients with stable renal function. The eGFR calculation will not be reliable in acutely ill patients when serum creatinine is changing rapidly. It is not useful in patients on dialysis. The eGFR calculation may not be applicable to patients at the low and high extremes of body sizes, pregnant women, and vegetarians.)  Cardiac:  11-Apr-16 01:56   Troponin I <0.03 (0.00-0.03 0.03 ng/mL or less: NEGATIVE  Repeat testing in 3-6 hrs  if clinically indicated. >0.05 ng/mL: POTENTIAL  MYOCARDIAL  INJURY. Repeat  testing in 3-6 hrs if  clinically indicated. NOTE: An increase or decrease  of 30% or more on serial  testing suggests a  clinically important change NOTE: New Reference Range  03/09/14)  Routine Hem:  11-Apr-16 01:56   WBC (CBC) 4.4  RBC (CBC) 3.81  Hemoglobin (CBC)  11.4  Hematocrit (CBC)  34.7  Platelet Count (CBC) 283  MCV 91  MCH 30.0  MCHC 32.9  RDW  17.9  Neutrophil % 58.0  Lymphocyte % 24.1  Monocyte % 13.3  Eosinophil % 3.7  Basophil % 0.9  Neutrophil # 2.5  Lymphocyte # 1.1  Monocyte # 0.6  Eosinophil # 0.2  Basophil # 0.0 (Result(s) reported on 12 Apr 2014 at 02:21AM.)   Assessment/Plan:  Assessment/Plan:  Assessment Chronic nausea.   Plan Recommend 4 hour gastric emptying scan to see if patient has gastroparesis. If test neg and nausea persists, will plan repeat EGD on Thurs. Thanks   Electronic Signatures: OVerdie Shire(MD)  (Signed 11-Apr-16 11:58)  Authored: Chief Complaint, VITAL SIGNS/ANCILLARY NOTES, Brief Assessment, Lab Results, Assessment/Plan   Last Updated: 11-Apr-16 11:58 by OVerdie Shire(MD)

## 2014-05-02 NOTE — Consult Note (Signed)
PATIENT NAME:  [name], [name] MR#:  [id number] DATE OF BIRTH:  07/27/1960  DATE OF CONSULTATION:  01/18/2014  REFERRING PHYSICIAN:  Linus Mako, MD CONSULTING PHYSICIAN:  Andria Meuse, NP  GASTROENTEROLOGY CONSULT  PRIMARY ONCOLOGIST: Kathlene November. Grayland Ormond, MD  REASON FOR CONSULTATION: Refractory intractable nausea and vomiting.   HISTORY OF PRESENT ILLNESS: [name] is a pleasant 54 year old Caucasian female who has stage IV adenocarcinoma of the lung, followed by Dr. Grayland Ormond. She has had multiple admissions for intractable nausea and vomiting with chemotherapy. She tells me she has not vomited since she vomited 3 times before she was admitted to the hospital. At home, she takes Phenergan 12.5 mg q. 6 hours, but has been increasing her dose on her own to 25 mg. She also  takes Kytril b.i.d. She is having a significant amount of pain with her left chest and left lateral chest where she had shingles a couple of months ago. She has been having some right upper quadrant pain, as well as chronic back pain. Previously for her nausea, she had been on Zofran 8 mg q. 8 hours. She has also been taking Carafate 1 g q.i.d. and has had a scopolamine patch. She is taking omeprazole 20 mg daily for chronic heartburn, and she feels that for the most part it is controlled. She does have a very poor appetite. She denies any rectal bleeding or melena. She denies any diarrhea or constipation. She does take hydromorphone and a fentanyl patch at home for pain control.   She had an EGD on 10/27/2013 by Dr. Gaylyn Cheers, which showed LA Grade B reflux esophagitis and a hiatal hernia.   She has episodes of nausea and vomiting, which require admission every couple weeks. Her last admission was 10 days ago. She denies any dysphagia or odynophagia.   She had a CT scan of the abdomen and pelvis without contrast, which showed possible biliary sludge, but no cholecystitis on 10/25/2013. There was also hepatic steatosis,  nonobstructing kidney stone, small pleural effusion, a small amount of pericardial fluid of doubtful significance, diverticulosis without diverticulitis.   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 EGD 10/27/2013.   MEDICATIONS PRIOR TO ADMISSION: Dronabinol 5 mg 1 capsule b.i.d., fentanyl 75 mcg patch q. 72 hours, gabapentin 300 mg t.i.d., granisetron 1 mg q.12 hours, hydromorphone 2 mg q. 4 hours p.r.n., lidocaine 5% film once daily, omeprazole 20 mg daily, Phenergan 12.5 mg q. 4 hours p.r.n., prochlorperazine 10 mg q. 6 hours p.r.n. nausea and vomiting, Senna 8.6 mg b.i.d., Carafate 1 g q.i.d., Zofran 8 mg q. 8 hours p.r.n.   ALLERGIES: No known drug allergies.   FAMILY HISTORY: Mother has history of lung carcinoma. There is no known family history of  colon carcinoma, liver or chronic GI problems.   SOCIAL HISTORY: She has a history of remote tobacco use. Rarely consumes alcohol. Denies any illicit drug use.   REVIEW OF SYSTEMS:  CONSTITUTIONAL: She has had fatigue and weakness.  CARDIOVASCULAR: She has chronic chest pain, shortness of breath.  MUSCULOSKELETAL: She has chronic back pain.  Otherwise,  negative 12-point review of systems.   PHYSICAL EXAMINATION:  VITAL SIGNS: Temperature 97.5, pulse 88, respirations 20, blood pressure 115/76, oxygen saturation 95% on 2 L via nasal cannula. BMI is 25.1, height 65.9 inches, weight 155.2 pounds.  GENERAL: She is an alert, oriented, pleasant, cooperative Caucasian  female in no acute distress.  HEENT: Sclerae clear. Conjunctivae pink. Oropharynx pink and moist, without any lesions.  NECK: Supple, without mass or thyromegaly.  CHEST: Heart regular rate and rhythm. Normal S1, S2. No murmurs, clicks, rubs, or gallops.  LUNGS: With decreased breath sounds bilaterally. No acute distress.  ABDOMEN: Positive  bowel sounds x 4. No bruits auscultated. The abdomen is soft, nondistended, nontender. No rebound tenderness or guarding. No hepatosplenomegaly or mass.  EXTREMITIES: Without clubbing or edema.  SKIN: Pink, warm and dry, without any rash or jaundice. She does have tattoos.  MUSCULOSKELETAL: Good equal movement and strength bilaterally.  NEUROLOGIC: Grossly intact.  PSYCHIATRIC: Alert, pleasant, cooperative. Normal mood and affect.   LABORATORY STUDIES: BMP on 01/16/2014 was 226. BUN 6, calcium 8.2; otherwise normal basic metabolic panel. Total protein 5.7, albumin 2.7; otherwise normal LFTs. Troponin was negative. CBC was normal. Her RDW was 18. Blood cultures with no growth x 48 hours.   She had a chest x-ray, which showed stable interstitial lung disease and right upper lobe patchy opacity, bilateral pleuritic expansile rib lesions have not changed significantly.   An abdominal ultrasound shows no gallstones, normal common bile duct, no hydronephrosis with a calcified nonobstructive calculus in the lower pole of right kidney that measures 1.2 x 0.9 cm, and probable fatty infiltration of the liver.   IMPRESSION: [name] is a very pleasant 54 year old Caucasian female with stage IV adenocarcinoma of lung with metastasis to bone, on chemotherapy; last dose December 2015, status post x-ray treatment with chronic pain syndrome, LA Grade B reflux esophagitis on EGD 10/27/2013 by Dr. Vira Agar, who was admitted with recurrent intractable nausea and vomiting. The patient has had multiple admissions for this, as she has "tried everything at home, and IV medications and fluids are the only things that help when she gets like this".  She feels like her nausea and vomiting is well controlled for the most part on Kytril b.i.d. and with an increased dose of Phenergan at home. Her abdominal ultrasound is benign. She has a significant amount of breakthrough pain, which seems to be the contributing factor to her  nausea.   PLAN:  1.  Increase Protonix to 40 mg b.i.d. here, but would send the patient home on Dexilant 60 mg daily.  2.  Increase at home Phenergan to 25 mg q. 6 hours.  3.  Continue p.r.n. Kytril b.i.d.  4.  She will discuss further pain management concerns with her oncology team.   Thank you for allowing [place] to participate in her care.    These services were provided by Andria Meuse, NP, under collaborative agreement with Dr Lucilla Lame.   ____________________________ Andria Meuse, NP klj:MT D: 01/18/2014 10:52:10 ET T: 01/18/2014 11:26:17 ET JOB#: [id number]  cc: Andria Meuse, NP, <Dictator> Kathlene November. Grayland Ormond, [place] SIGNED 01/21/2014 14:25

## 2014-05-02 NOTE — Consult Note (Signed)
Brief Consult Note: Diagnosis: anemia.   Patient was seen by consultant.   Consult note dictated.   Recommend further assessment or treatment.   Comments: Please see full GI consult.  Patietn presenting with mild chronic anemia.   Please see full Gi consult.  Electronic Signatures: Loistine Simas (MD)  (Signed 06-Mar-16 17:43)  Authored: Brief Consult Note   Last Updated: 06-Mar-16 17:43 by Loistine Simas (MD)

## 2014-05-02 NOTE — Consult Note (Signed)
Patient likely to proceed with open lung biopsy with Dr. Genevive Bi in the next several days. Agree with this plan. Will comment further after biopsy is completed. follow.  Electronic Signatures: Delight Hoh (MD)  (Signed on 21-Jan-16 11:59)  Authored  Last Updated: 21-Jan-16 11:59 by Delight Hoh (MD)

## 2014-05-02 NOTE — Consult Note (Signed)
Note Type Consult   HPI: This 54 year old Female patient presents to the clinic for follow up  lung cancer .  Subjective: Chief Complaint/Diagnosis:   Stage IV adenocarcinoma of lung with widespread bony metastasis. Admitted with left lower leg erythema, swelling and pain consistent with cellulitis. HPI:   Patient last seen in clinic on February 18, 2014 where she received nivolumab.  Patient was in her usual state of health until approximately one week ago when she noticed increasing pain and swelling in her left lower extremity. Patient states the pain gets unbearable and she presented to the emergency room and was found to have cellulitis. X-ray and ultrasound were negative. Currently, patient feels improved and does not complain of lower extremity pain or swelling. She denies any fevers. She has no neurologic complaints. She has no chest pain or shortness of breath. She denies any nausea, vomiting, constipation, or diarrhea. She has no urinary complaints. Patient offers no further specific complaints today.     Review of Systems:  Performance Status (ECOG): 1  Psych: no complaints  Pain ?: Yes  Pain- Qualitative: Moderate  Pain- Plan: Narcotic analgesic ordered  Discussed with patient: Prohylactic stimulant laxative  Stool softner  Dietary fiber  Fluids  Pain Effectiveness: Pain relief obtained  Emotional well-being: None  Review of Systems: All other systems were reviewed and found to be negative  Review of Systems:   As per HPI. Otherwise, 10 point system review was negative.   Allergies:  No Known Allergies:   Smoking History: Smoking History Quit 1998, 20 year history.  PFSH: Additional Past Medical and Surgical History: negative.    Family history: Mother with lung cancer, father with prostate cancer.    Social history: Tobacco as above, occasional alcohol.   Home Medications: Medication Instructions Last Modified Date/Time  HYDROmorphone 4 mg oral tablet 1 tab(s)  orally every 4 hours, As Needed, severe pain (7-10/10) 02-Mar-16 00:29  dronabinol 5 mg oral capsule 1 cap(s) orally 2 times a day 02-Mar-16 00:29  omeprazole 20 mg oral delayed release capsule 1 cap(s) orally once a day 02-Mar-16 00:29  scopolamine Apply topically to affected area every 3 days 02-Mar-16 00:29  OLANZapine 2.5 mg oral tablet 1 tab(s) orally once a day 02-Mar-16 00:29  gabapentin 300 mg oral capsule 1 cap(s) orally 3 times a day 02-Mar-16 00:29  amitriptyline 25 mg oral tablet 1 tab(s) orally once a day (at bedtime) 02-Mar-16 00:29  granisetron 1 mg oral tablet 1 tab(s) orally every 12 hours 02-Mar-16 00:29  Senna 8.6 mg oral tablet 1 tab(s) orally 2 times a day 02-Mar-16 00:29  lidocaine 5% topical film Apply topically to affected area once a day 02-Mar-16 00:29  sucralfate 1 g/10 mL oral suspension 10 milliliter(s) orally 4 times a day (with meals and at bedtime) 02-Mar-16 00:29  fentaNYL 100 mcg/hr transdermal film, extended release 1 patch transdermal every 72 hours 02-Mar-16 00:29  promethazine 12.5 mg oral tablet 1 tab(s) orally every 4 hours 02-Mar-16 00:29  Combivent Respimat CFC free 100 mcg-20 mcg/inh inhalation aerosol 1 puff(s) inhaled 4 times a day, As Needed 02-Mar-16 00:29  polyethylene glycol 3350 - oral powder for reconstitution 17 gram(s) orally once a day, As Needed - for Constipation 02-Mar-16 00:29  ondansetron 8 mg oral tablet, disintegrating 1 tab(s) orally every 8 hours 02-Mar-16 00:29   Vital Signs:  :: vital signs stable, patient afebrile.   Physical Exam:  General: well developed, well nourished, and in no acute distress  Mental Status: normal affect  Eyes: anicteric sclera  Respiratory: clear to auscultation bilaterally  Cardiovascular: regular rate and rhythm, no murmur, rub, or gallop  Gastrointestinal: soft, nondistended, nontender, no organomegaly.  normal active bowel sounds  Musculoskeletal: scant edema, no erythema, mild tenderness to  palpation of left lower extremity.  Skin: No rash or petechiae noted  Neurological: alert, answering all questions appropriately.  Cranial nerves grossly intact   Laboratory Results: LabObservation:  01-Mar-16 22:42   OBSERVATION Reason for Test BLE edema, L>R  Routine Micro:  01-Mar-16 21:55   Micro Text Report BLOOD CULTURE   COMMENT                   NO GROWTH IN 18-24 HOURS   ANTIBIOTIC                       Culture Comment NO GROWTH IN 18-24 HOURS  Result(s) reported on 04 Mar 2014 at 12:00AM.  Cardiology:  01-Mar-16 22:40   Ventricular Rate 124  Atrial Rate 124  P-R Interval 122  QRS Duration 84  QT 308  QTc 442  P Axis 60  R Axis 65  T Axis 90  ECG interpretation Sinus tachycardia Otherwise normal ECG When compared with ECG of 31-Jan-2014 12:05, Vent. rate has increased BY  50 BPM Questionable change in QRS axis Nonspecific T wave abnormality now evident in Inferior leads ----------unconfirmed---------- Confirmed by OVERREAD, NOT ([id number]), editor PEARSON, BARBARA ([id number]) on 03/03/2014 8:05:34 AM  Routine Chem:  01-Mar-16 21:56   Glucose, Serum 92  BUN 7  Creatinine (comp)  0.34  Sodium, Serum 140  Potassium, Serum 3.7  Chloride, Serum 105  CO2, Serum 25  Calcium (Total), Serum  8.0  Anion Gap 10  Osmolality (calc) 277  eGFR (African American) >60  eGFR (Non-African American) >60 (eGFR values <45m/min/1.73 m2 may be an indication of chronic kidney disease (CKD). Calculated eGFR, using the MRDR Study equation, is useful in  patients with stable renal function. The eGFR calculation will not be reliable in acutely ill patients when serum creatinine is changing rapidly. It is not useful in patients on dialysis. The eGFR calculation may not be applicable to patients at the low and high extremes of body sizes, pregnant women, and vegetarians.)  Cardiac:  01-Mar-16 21:56   Troponin I < 0.02 (0.00-0.05 0.05 ng/mL or less: NEGATIVE  Repeat testing in 3-6 hrs  if  clinically indicated. >0.05 ng/mL: POTENTIAL  MYOCARDIAL INJURY. Repeat  testing in 3-6 hrs if  clinically indicated. NOTE: An increase or decrease  of 30% or more on serial  testing suggests a  clinically important change)  Routine Hem:  01-Mar-16 21:56   WBC (CBC) 5.1  RBC (CBC)  3.17  Hemoglobin (CBC)  9.9  Hematocrit (CBC)  30.1  Platelet Count (CBC) 216  MCV 95  MCH 31.2  MCHC 32.9  RDW  20.4  Neutrophil % 77.0  Lymphocyte % 10.9  Monocyte % 11.2  Eosinophil % 0.4  Basophil % 0.5  Neutrophil # 3.9  Lymphocyte #  0.6  Monocyte # 0.6  Eosinophil # 0.0  Basophil # 0.0 (Result(s) reported on 02 Mar 2014 at 10:20PM.)   Assessment and Plan: Impression:   Stage IV adenocarcinoma of the lung with bone metastasis, cellulitis. Plan:   1.  Lung cancer: Pathology results from open lung biopsy revealed lymphangitic spread of tumor. Patient received her most recent infusion of nivolumab on February 18, 2014. She  was scheduled to receive her next infusion today, but because of her cellulitis, will delay this 1 week. Pain: Continue current narcotic regimen, patient does not wish to change at this time.Cellulitis: Continue current antibiotics. Ultrasound and x-rays negative. consult, call with questions.  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

## 2014-05-02 NOTE — Discharge Summary (Signed)
PATIENT NAME:  [name], [name] MR#:  [id number] DATE OF BIRTH:  25-Dec-1960  DATE OF ADMISSION:  12/29/2013 DATE OF DISCHARGE:  01/06/2014  PRIMARY ONCOLOGIST: Lloyd Huger, MD    ADMITTING DIAGNOSES:  1.  Shortness of breath.  2.  Stage IV adenocarcinoma of lung with bony metastases.  3.  Chronic esophagitis and vomiting.   DISCHARGE DIAGNOSES: 1.  Persistent dyspepsia secondary to pneumonitis versus chemotherapy-induced post obstructive pneumonia versus lymphangitic cancer.  2.  Stage IV adenocarcinoma of lung with bony metastases.  3.  Chronic pain syndrome.  4.  History of pericardial effusion.  5.  Insomnia.  6.  Intermittent dizziness.   Please refer to the interim discharge summary dictated by Dr. Margaretmary Eddy, January 5, for further details of the hospital course. There have been no changes in the patient's condition overnight.   There have been no changes to the discharge instructions.   There have been no changes made to discharge medication list.    TIME SPENT: Time spent on discharge was 20 minutes as the patient had already been prepared for discharge by Dr. Margaretmary Eddy.   ____________________________ Earleen Newport. Volanda Napoleon, MD cpw:nt D: 01/07/2014 20:26:00 ET T: 01/07/2014 22:46:26 ET JOB#: [id number]  cc: Kathlene November. Grayland Ormond, MD Earleen Newport. Volanda Napoleon, MD, <Dictator>   Aldean Jewett MD ELECTRONICALLY SIGNED 01/12/2014 13:17

## 2014-05-02 NOTE — Discharge Summary (Signed)
PATIENT NAME:  [name], [name] MR#:  [id number] DATE OF BIRTH:  11/04/1960  DATE OF ADMISSION:  03/03/2014 DATE OF DISCHARGE:  03/05/2014  ADMITTING PHYSICIAN: Rosilyn Mings, MD  DISCHARGING PHYSICIAN: Gladstone Lighter, MD  PRIMARY ONCOLOGIST: Delight Hoh, MD  [place]: Oncology by Dr. Grayland Ormond.  DISCHARGE DIAGNOSES:  1.  Left lower leg cellulitis.  2.  Chronic pain from bony metastases.  3.  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  4.  Sinus tachycardia.  5.  Gastroesophageal reflex disease.  6.  Diastolic congestive heart failure.  DISCHARGE HOME MEDICATIONS: 1.  Granisetron 1 mg p.o. b.i.d.  2.  5% lidocaine patch to effected area once a day. 3.  Senna 8.6 mg 1 tablet p.o. b.i.d.  4.  Sucralfate 1 gram/10 mL oral suspension 10 mL 4 times a day.  5.  Amitriptyline 25 mg p.o. daily.  6.  Scopolamine patch 1 patch q. 3 days.  7.  Olanzapine 2.5 mg p.o. daily.  8.  Gabapentin 300 mg p.o. 3 times a day.  9.  Omeprazole 20 mg p.o. daily.  10.  Dilaudid 4 mg q. 4 hours p.r.n. for pain.  11.  Dronabinol 5 mg p.o. b.i.d.  12.  Fentanyl patch 100 mcg q. 72 hours. 13.  Promethazine 12.5 mg p.o. q. 4 hours.  14.  Combivent Respimat 1 puff 4 times a day.  15.  Polyethylene glycol once a day as needed for constipation. 16.  Zofran 8 mg p.o. q. 8 hours.  17.  Metoprolol 25 mg 3 times a day.  18.  Keflex 500 mg p.o. 3 times a day for 7 more days.   DISCHARGE DIET: Regular diet.   DISCHARGE ACTIVITY: As tolerated.   HOME OXYGEN: Two liters by nasal cannula.   FOLLOWUP INSTRUCTIONS:  1.  Follow up with Dr. Grayland Ormond in 1 week for chemo. 2.  PCP follow-up in 2 weeks.   LABORATORY AND IMAGING STUDIES PRIOR TO DISCHARGE: WBC 4.2, hemoglobin 8.9, hematocrit 28.3, platelet count 254,000.   Sodium 142, potassium 3.4, chloride 108, bicarb 28, BUN 7, creatinine 0.54,  glucose 95, and calcium 8.5.   Left foot x-ray showing no fracture, no  dislocation, no evidence of arthropathy or focal bony abnormality.  Urinalysis negative for any infection. Blood cultures are negative.   Ultrasound Dopplers bilateral lower extremities showing no evidence of any DVT.   Chest x-ray showing vascular congestion, bilateral airspace opacities, mild interstitial edema more prominent on the right side.   TSH within normal limits at 4.18.   BRIEF HOSPITAL COURSE: Ms. Liszewski is a 54 year old Caucasian female with past medical history significant for stage IV adenocarcinoma of the right lung with bony mets, diastolic CHF, history of pericardial effusion status post pericardiocentesis, remote smoking, chronic pain from her bone mets, presented to the hospital secondary to worsening left lower extremity swelling and erythema, noted to have cellulitis.  1.  Left lower extremity cellulitis. Negative for any DVT. Negative for any fractures. Kept leg elevated, started on Vancocin and Zosyn. That was changed over to Ancef in the hospital with improvement in her swelling and also erythema, although the patient complains of chronic pain. She was ambulating to the bathroom without any effort. She is being discharged on Keflex. 2.  Stage IV adenocarcinoma of right lung with bony mets, status post chemotherapy. The patient is receiving nivolumab weekly; however, because of her infection in the hospital and being admitted to the hospital oncology decided  to postpone her chemo for 1 week after discharge.  3.  All her other home pain medication and nausea medications are being continued without any changes.  4.  Sinus tachycardia, chronic since her pericardiocentesis. She was started on low-dose metoprolol here in the hospital.  5.  Known history of diastolic CHF, uses Lasix p.r.n. at home.   Her course has been otherwise uneventful.   DISCHARGE CONDITION: Guarded.   DISCHARGE DISPOSITION: Home with home health. The patient is needing 2 liters oxygen. Her room air sats  on exertion went down as low as 88%. She is being discharged on 2 liters oxygen.  TIME SPENT ON DISCHARGE: 45 minutes.   ____________________________ Gladstone Lighter, MD rk:sb D: 03/05/2014 12:26:02 ET T: 03/05/2014 14:05:34 ET JOB#: [id number]  cc: Gladstone Lighter, MD, <Dictator> Kathlene November. Grayland Ormond, MD Gladstone Lighter MD ELECTRONICALLY SIGNED 03/18/2014 18:09

## 2014-05-02 NOTE — Discharge Summary (Signed)
PATIENT NAME:  [name], [name] MR#:  [id number] DATE OF BIRTH:  May 04, 1960  DATE OF ADMISSION:  01/16/2014 DATE OF DISCHARGE:  01/28/2014  ADMITTING DIAGNOSES: Acute respiratory failure with progressively worsening shortness of breath.  DISCHARGE DIAGNOSES: 1.  Acute respiratory failure with hypoxia of unclear etiology, suspected pain related status post back procedure, VATS procedure with right middle lobe and right lower lobe biopsy by Dr. Genevive Bi on 01/26/2014. 2.  Chronic nausea. 3.  Chronic pain syndrome.  4.  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  5.  Gastroesophageal reflux disease.   DISCHARGE CONDITION: Stable.   DISCHARGE MEDICATIONS: The patient is to continue: 1.  Granisetron 1 mg every 12 hours. 2.  Omeprazole 20 mg p.o. daily. 3.  Lidocaine 5% topical film apply to infected area once daily.  4.  Senna 8.6 mg twice daily.  5.  Sucralfate 1 gram 4 times daily with meals. 6.  Prochlorperazine 10 mg every 6 hours as needed.  7.  Dronabinol 5 mg p.o. twice daily.  8.  Fentanyl transdermal film 75 mcg topically every 72 hours.  9.  Phenergan 12.5 mg every 4 hours.  10.  Gabapentin 300 mg 2 capsules, which will be 600 mg 3 times daily and 600 mg at bedtime.  11.  Zofran 8 mg every 8 hours.  12.  Hydromorphone 4 mg every 4 hours as needed.  13.  Acetaminophen/oxycodone 325 mg/5 mg 1 tablet every 4 hours as needed.  14.  Amitriptyline 25 mg p.o. at bedtime.  15.  Scopolamine 1 patch every 3 days.  16.  Voriconazole 200 mg p.o. every 12 hours for 14 days until BAL results are known with follow up with Dr. Ola Spurr.  17.  Olanzapine 2.5 mg p.o. once daily.  18.  Combivent Respimat 1 puff 4 times daily as needed.  19.  MiraLax 17 grams once daily as needed.  20.  Prednisone taper 50 mg p.o. once on 01/29/2014 then taper  x 10 mg every 2 days until stopped.   SERVICES: Home health services, physical therapy as well as nurse. The patient was advised to have dry bandage  applied to the wound and keep it dry and clean.   HOME OXYGEN: None.   DIET: Two gram salt, low fat, low cholesterol with Ensure supplements up to 3 times daily as needed, regular consistency.   ACTIVITY LIMITATIONS: As tolerated.   REFERRAL: To home health, physical therapy as well as R.N., as mentioned above.   FOLLOWUP APPOINTMENT: With Dr. Grayland Ormond 2 days after discharge; Dr. Ola Spurr in 1-2 weeks after discharge; and Dr. Genevive Bi in 1 week after discharge.   CONSULTANTS: Care management; social work; English as a second language teacher E. Genevive Bi, MD; Kathlene November. Grayland Ormond, MD; palliative care, Maudry Mayhew, NP; Mariane Duval, MD; Cheral Marker. Ola Spurr, MD; Allyne Gee, MD.  RADIOLOGIC STUDIES: Chest x-ray AP and lateral 01/16/2014 revealed no acute abnormalities, stable interstitial lung disease and right upper lobe patchy opacity, bilateral sclerotic expansile rib lesions that have not change significantly. Ultrasound of abdomen, general survey, 01/18/2014 showed no gallstones within the gallbladder, normal common bile duct, no hydronephrosis, calcified nonobstructing calculus in the lower pole of the right kidney measuring 1.2/0.9 cm, no aortic aneurysm, probable fatty infiltration of the liver. Chest x-ray, portable single view, 01/26/2014 showed right chest tube in place, no pneumothorax or pleural effusion identified, lower lung volumes with mildly increased streaky bibasilar opacity, favor atelectasis, stable underlying bilateral reticulonodular pulmonary opacity. A repeated chest x-ray,  portable single view, 01/26/2014 revealed stable patchy right basilar airspace opacities, stable sclerotic left anterior fifth rib lesion, presumably metastasis based on review of prior examinations. Chest x-ray PA and lateral, 01/27/2014, revealed a small right pneumothorax with right chest tube present, no change in diffusely prominent interstitial markings and postoperative changes on the right. Repeated chest x-ray PA and  lateral, 01/28/2014, showed stable minimal right apical pneumothorax as compared to prior examination, chest tube has been removed.   HOSPITAL COURSE: The patient is a 54 year old Caucasian female with history of lung carcinoma who presents to the hospital on 01/16/2014 with complaints of shortness of breath as well as weakness. Please refer to Dr. Boykin Reaper admission note on 01/16/2014. Also, please refer to interim discharge summary dictated by Dr. Volanda Napoleon on 01/22/2014.   1.  In regard to acute respiratory failure, apparently the patient has been having shortness of breath with intermittent occasional hypoxia episodes of unclear etiology. It was thought to be related to pleuritic chest pain as well as neuropathy and radiculopathy. Also, there was a concern of possibility of lymphangitic spread of cancer or scarring in the lungs, but there was no evidence of pneumonia as the patient was afebrile and her white blood cell count was normal and her chest x-ray remained stable. Recent bronchoscopy with BAL results revealed some fungus, results are still pending at the time of dictation. The patient was seen by Dr. Ola Spurr who recommended voriconazole for the next 2 weeks until she is seen by him and make decisions about discontinuation or continuation of this medication. The patient underwent VATS procedure with right middle lobe as well as right lower lobe biopsy on 01/25/2014 per Dr. Genevive Bi. So far, cultures taken during this biopsy show no fungal or bacterial growth. The patient had chest tubes for a short period of time; however those were removed and the patient is to follow up with her primary physician, Dr. Grayland Ormond, as well as Dr. Genevive Bi and Dr. Ola Spurr in the next 1-2 weeks after discharge.  2.  In regard to stage IV lung carcinoma, the patient is being followed by Dr. Grayland Ormond and medical therapy or supportive therapy is per Dr. Grayland Ormond. The patient is to continue pain medications as needed. 3.  In  regard to chronic nausea, the patient's nausea was relatively well controlled with Phenergan, Zofran as well as Compazine. The patient did have abdominal ultrasound done during this admission which showed no acute pathology. No vomiting was noted while she was in the hospital; however, the patient did have poor oral intake overall.  4.  In regard to chronic pain, the patient was seen by palliative care and recommended Dilaudid as well as advanced doses of fentanyl and Lidoderm patch. The patient's gabapentin was advanced as well as amitriptyline was added to neuropathic pain. The patient was offered Pain Clinic followup as outpatient or inpatient, however, she refused stating that Dr. Grayland Ormond meets her pain medication needs adequately. 5.  In regard to gastroesophageal disease, the patient is to continue Protonix.   The patient is being discharged home with [facility] referral on 01/28/2014.  Her vital signs on the day of discharge, temperature 97.9, pulse was 91, respiration rate was 18, blood pressure 131/81, saturation was 93% to 94% on room air at rest.   TIME SPENT: Forty minutes.     ____________________________ Theodoro Grist, MD rv:TT D: 01/31/2014 13:23:00 ET T: 01/31/2014 20:51:48 ET JOB#: [id number]  cc: Kathlene November. Grayland Ormond, [place]. Genevive Bi, MD Cheral Marker. Ola Spurr, MD  Theodoro Grist, MD, <Dictator>       Kaiel Weide Ether Griffins MD ELECTRONICALLY SIGNED 02/11/2014 15:58

## 2014-05-02 NOTE — H&P (Signed)
PATIENT NAME:  [name], MAFFEO MR#:  [id number] DATE OF BIRTH:  12-Jul-1960  DATE OF ADMISSION:  04/07/2014  PRIMARY CARE PHYSICIAN:  Dr. Delight Hoh, he is also the patient's oncology.    EMERGENCY ROOM PHYSICIAN:  Dr. Harvest Dark.    CHIEF COMPLAINT: Nausea, vomiting, abdominal pain.   HISTORY OF PRESENT ILLNESS: The patient is a 54 year old female with a known history of metastatic lung cancer with lymphangitic spread and metastasis to bone followed by Dr. Grayland Ormond, who is being admitted for intractable nausea, vomiting, and abdominal pain. The patient has had 8 admissions in the last 3 months, the last one being from March 31 until April 3 when she was discharged. She was doing okay for a day or two and started having nausea and vomiting at home, was unable to keep anything down. She was having a lot of abdominal pains and decided to come to the Emergency Department. While in the ED she was found to have potassium of 2.1, her magnesium is 1.3, and she is being admitted for further evaluation and management.   PAST MEDICAL HISTORY:  Stage IV metastatic lung cancer with bony metastases and recent lymphangitic spread, chronic history of depression, chronic history of COPD, GERD, chronic diastolic congestive heart failure.    SOCIAL HISTORY:  Lives at home. No history of smoking, alcohol or illicit abuses  currently.   PAST SURGICAL HISTORY: Port-A-Cath placement  ALLERGIES: No known drug allergies.   FAMILY HISTORY:  Heart condition runs in her family.    REVIEW OF SYSTEMS:    CONSTITUTIONAL: No fever or change in weight.  EYES: No blurred or double vision. No glaucoma.  ENT: No tinnitus or hearing loss. No nasal discharge or bleeding. No difficulty swallowing.  RESPIRATORY: The patient has had cough, but denies wheezing or hemoptysis. No painful respiration.  CARDIOVASCULAR: No chest pain or syncope. No palpitations.  GASTROINTESTINAL: The patient has had nausea, vomiting and  diarrhea. Denies abdominal pain or change in bowel habits.  GENITOURINARY: No dysuria or hematuria. No incontinence.  ENDOCRINE: No polyuria or polydipsia. No heat or cold intolerance.  HEMATOLOGIC: The patient admits to anemia from her cancer, but denies bruising or bleeding.  LYMPHATIC: No swollen glands.  MUSCULOSKELETAL: The patient does have pain in her back and neck. No gout.  NEUROLOGIC: No numbness or migraines. Denies stroke or seizures.  PSYCHOLOGICAL: The patient denies anxiety, insomnia or depression.   MEDICATIONS AT HOME:  1. Keflex 500 mg p.o. b.i.d.  2. Combivent 1 puff inhaled 4 times a day as needed.  3. Dronabinol 5 mg p.o. b.i.d.  4. Fentanyl 100 mcg patch transdermal every 72 hours.   5. Lasix 20 mg p.o. b.i.d.  6. Gabapentin 300 mg p.o. 3 times a day.  7. Hydromorphone 4 mg 1 tablet every 4 hours as needed.  8. Klor-Con 10 mEq 4 tablets p.o. b.i.d.  9. Lidocaine 5% topical film apply 2 patch to affected area once a day.  10. Metoprolol 25 mg p.o. 3 times a day.  11. Omeprazole 20 mg p.o. daily.  12. Zofran 8 mg p.o. every 8 hours as needed.   13. MiraLax once daily as needed.  14. Promethazine 25 mg p.o. every 6  hours as needed.  15. Scopolamine 1.5 mg transdermal patch every 72 hours.   PHYSICAL EXAMINATION:  VITAL SIGNS: Temperature 98.2, heart rate 119 per minute, respirations 20 per minute, blood pressure 106/69. She is saturating 96% on 2 liters oxygen via nasal cannula.  GENERAL: The patient is a 54 year old female lying in the bed, in pain.  EYES: Pupils equal, round, reactive to light and accommodation. No scleral icterus. Extraocular muscles intact.  HEENT: Head atraumatic, normocephalic.  Oropharynx and nasopharynx clear.  NECK:  Supple, No JVD,  No thyroid enlargement or tenderness.  LUNGS: Clear to auscultation bilaterally. No wheezing, rales, rhonchi, or crepitation.  CARDIOVASCULAR: S1, S2 normal. No murmurs, rubs, or gallop.  ABDOMEN: Soft,  nontender, nondistended. Bowel sounds present. No organomegaly or mass.  EXTREMITIES: No pedal edema, cyanosis, clubbing.  NEUROLOGIC: Cranial nerves II through XII intact. Muscle strength 5 out of 5 in extremities. Sensation intact.  PSYCHIATRIC: The patient is alert and oriented x 3.  SKIN:  She has minimal left lower extremity erythema, seems to be improving.  CHEST WALL: She has a Port-A-Cath in place. Site looks clean. No signs of infection.  MUSCULOSKELETAL: No joint effusion or tenderness.   LABORATORY PANEL:  Normal BMP except potassium of 2.1, magnesium of 1.3. Normal liver function tests. CBC showed a white count of 3.4, hemoglobin 11.2, hematocrit 34.5, platelet 270,000.   IMPRESSION AND PLAN:  1.  Intractable nausea, vomiting, diarrhea, likely viral gastroenteritis. We will admit to the medical/surgical floor with off unit telemetry for symptomatic management and IV fluid hydration will be started.   2.  Severe hypokalemia/hypomagnesemia. We will aggressively replete and recheck.  3.  Stage IV adenocarcinoma of the lung with widespread bony metastasis. Consult oncology Dr. Grayland Ormond who knows her well. She has had a chemotherapy about 2 weeks ago.   4.  Generalized weakness, multifactorial in nature.  5.  Disposition.  She is very difficult to manage outside hospital environment. Consider long-term acute care placement if she qualifies and/or consider COPD Gold to avoid rehospitalization. She has had about 8 hospitalizations in the last 3 months, this is her ninth one.  I have discussed this with care management.   CODE STATUS: Full code.   TOTAL TIME TAKING CARE OF THIS PATIENT: 45 minutes.    ____________________________ Lucina Mellow. Manuella Ghazi, MD vss:bu D: 04/07/2014 14:26:59 ET T: 04/07/2014 14:56:05 ET JOB#: [id number]  cc: Kamyiah Colantonio S. Manuella Ghazi, MD, <Dictator> Kathlene November. Grayland Ormond, [place] MD ELECTRONICALLY SIGNED 04/08/2014 10:52

## 2014-05-02 NOTE — H&P (Signed)
PATIENT NAME:  [name], [name] MR#:  [id number] DATE OF BIRTH:  25-Feb-1960  DATE OF ADMISSION:  04/01/2014  PRIMARY CARE PHYSICIAN: Nonlocal.  PRIMARY ONCOLOGIST:  Lloyd Huger, MD   REFERRING EMERGENCY ROOM PHYSICIAN:  Shellia Cleverly, MD   CHIEF COMPLAINT: Nausea, vomiting, and epigastric abdominal pain.   HISTORY OF PRESENT ILLNESS: The patient is a 54 year old Caucasian female with past medical history of malignant lung cancer with lymphangitic spread, is undergoing chemotherapy and her last chemotherapy was last Thursday.  Is presenting to the ED with a chief complaint of nausea, vomiting, epigastric abdominal pain radiating to the right upper quadrant abdominal pain. She is reporting that these symptoms were started from yesterday. She denies any blood in her vomit.  Denies any diarrhea.  She was taking p.o. steroids which were discontinued by her oncologist just recently. The patient's last chemotherapy was on last Thursday. The patient is reporting that she is able to tolerate clear liquids.  Also complaining of left lower leg redness and swelling. The patient came into the ED.   Lower extremity venous Dopplers of left lower extremity has revealed no deep vein thrombosis. Chest x-ray has revealed cardiomegaly with suspected congestive heart failure, which is worse from previous study. Her potassium was found to be 2.8; however, she has received 30 mEq of potassium  supplement in the ED.  The patient was taking 80 mEq of potassium on a daily basis but she could not take today because of her nausea and vomiting. Denies any fever. No other complaints. No dizziness or loss of consciousness.   PAST MEDICAL HISTORY:   Stage IV metastatic lung cancer with bony metastases and recent lymphangitic spread, chronic history of depression, chronic history of COPD, GERD, chronic diastolic congestive heart failure.    PAST SURGICAL HISTORY:  Port-A-Cath placement.   ALLERGIES:  No known drug allergies.    PSYCHOSOCIAL HISTORY: Lives at home. No history of smoking, alcohol or illicit abuses  currently.   FAMILY HISTORY: Heart condition runs in her family.    REVIEW OF SYSTEMS:   CONSTITUTIONAL:  Denies any fever, complaining of weakness.  EYES:   Denies double vision or double vision.  ENT:  Denies epistaxis or discharge.  RESPIRATION:  Complaining of shortness of breath which has been getting worse which is chronic from her lung cancer.  It feels better with oxygen via nasal cannula.  CARDIOVASCULAR:  No chest pain, palpitations, dizziness, or syncope.  GASTROINTESTINAL: Denies diarrhea but has nausea, vomiting, and epigastric abdominal pain. Denies any hematemesis.  GENITOURINARY: Denies no dysuria or hematuria.  ENDOCRINE: Denies polyuria, nocturia, or thyroid problems.  LYMPHATIC:  No anemia, easy bruising or bleeding.  INTEGUMENTARY:  Denies any rash or lesions, except left lower extremity erythema, tenderness and swelling.  NEUROLOGIC:   Denies vertigo or ataxia.  PSYCHIATRIC:   No ADD or OCD.  PHYSICAL EXAMINATION: VITAL SIGNS: Temperature 98.1, pulse 109, respirations 22, blood pressure 103/81, pulse oximetry is 98%.  GENERAL APPEARANCE: Not in any acute distress. Moderately built and nourished, looks edematous.  HEENT: Normocephalic, atraumatic. Pupils are equally reacting to light and accommodation. No scleral icterus. No conjunctival injection. No sinus tenderness. No postnasal drip. Moist mucous membranes.  NECK: Supple. No JVD.   No thyromegaly. Range of motion is intact.  LUNGS: Moderate air entry; positive rhonchi.  CARDIAC: S1, S2 normal. Regular rate and rhythm, tachycardic.  CHEST:   Anterior chest wall has Port-A-Cath intact.  GASTROINTESTINAL: Soft, epigastric and right upper  quadrant tenderness is present. No masses felt.  NEUROLOGIC: Awake, alert, and oriented x 3. Cranial nerves II-XII are grossly intact. Motor and sensory are intact. Reflexes are 2+.    EXTREMITIES: Left lower extremity erythematous, tender, edematous and.  PSYCHIATRIC:   Normal mood and affect.   HOME MEDICATIONS:  Scopolamine 1.5 mg 1 patch transdermally every 72 hours; Miralax 17 gm p.o. once a day, Zofran 8 mg 1 tablet p.o. every 8 hours for nausea and vomiting; metoprolol tartrate 25 mg 1-1/2 tablets 2 times a day, lidocaine 5% topical film applied  to affected area once daily, Klor-Con 20 mEq  2 tablets p.o. 2 times a day, hydromorphone 4 mg 1 tablet p.o. every 4 hours for severe pain, gabapentin 300 mg 1 capsule p.o. 3 times a day, fentanyl 100 mcg per hour 1 patch transdermally every 72 hours, dronabinol 5 mg 1 capsule p.o. 2 times a day, Combivent 1 puff inhalation 4 times a day.   LABORATORY AND IMAGING STUDIES:  Chest x-ray, portable:  Cardiomegaly with  suspected CHF. This has worsened compared with a recent prior.  Left lower extremity venous Doppler:   No DVT. BMP reveals a potassium at 2.8, chloride 99, calcium 8.2. The rest of the BMP is normal. Troponin less than 0.03, WBC 4.1, hemoglobin 9.7, hematocrit 33.0, platelets are 208,000.   Urinalysis: Nitrates and leukocyte esterase are negative, ketones 2+, glucose negative.   A 12-lead EKG:  Sinus tachycardia at 122 beats per minute. No acute ST-T wave changes but had revealed nonspecific ST wave changes.   ASSESSMENT AND PLAN: A 54 year old Caucasian female who comes into the ED with a chief complaint of epigastric and right upper quadrant abdominal pain associated with nausea and vomiting. Denies any diarrhea or hematemesis, or melena. Also, she is complaining of left lower extremity redness and tenderness. Will be admitted with the following assessment and plan:  1. Acute epigastric abdominal pain radiating to right upper quadrant associated with nausea and vomiting, probably viral or chemotherapy related gastritis.  Will admit her to medical surgical unit on off unit telemetry. At this point of time, the patient is  able to tolerate  liquids and she prefers advancing her diet to soft. We will provide antiemetics and proton pump inhibitor. In view of worsening of the pleural effusions and diastolic congestive heart failure, we will hold off on the fluids at this time. We will provide pain management and if necessary we will consider gastrointestinal consult.   We will continue her home medications, including dronabinol. 2. Hypokalemia.  This is probably from intractable nausea and vomiting from yesterday. The patient was unable to take her potassium supplements which she usually takes at home. So far she has revealed 20 mEq of potassium supplements in the ED. We will resume her home medication including the potassium supplements and also  additional 40 mEq of IV potassium. We will recheck her potassium and magnesium in the a.m. 3. 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  Her prednisone is discontinued by Dr. Grayland Ormond as reported by the patient. I will continue Lasix with close monitoring of her electrolytes as it can deplete potassium. Will simply monitor her   intake and output.  4. Malignant lung cancer with lymphangitic spread. She also has bony metastases. We will continue her fentanyl patch, lidocaine patch, and gabapentin.  The patient is to follow up with Dr. Grayland Ormond as scheduled. Currently on chemotherapy.   Next chemotherapy is next Thursday.  5.  Chronic history of depression. Currently denies any.  Denies any suicidal ideation or homicidal ideation.   6. Left lower extremity cellulitis. I will start the patient on Rocephin.  We will load for dvt.   We will provide gastrointestinal prophylaxis with Protonix and deep vein thrombosis prophylaxis with Lovenox subcutaneous. 7.  Chronic diastolic congestive heart failure with worsening of edema. We will hold off on the IV fluids, resume her home medications, and provide her Lasix.  Monitor strict I's and O's.   If necessary, we  will consult to cardiology.   Plan of care discussed in detail with the patient and her sister-in-law at bedside. They both verbalized understanding of the plan. She is full code.   Sister-in-law, Ms. Blain Pais, is the medical power of attorney. The patient's recent echocardiogram was done on December 31 which has revealed 55-60% of left ventricular ejection fraction.   TOTAL TIME SPENT ON THE ADMISSION:  50 minutes.    ____________________________ Nicholes Mango, MD ag:tr D: 04/01/2014 15:34:29 ET T: 04/01/2014 16:03:16 ET JOB#: [id number]  cc: Nicholes Mango, MD, <Dictator> Kathlene November. Grayland Ormond, MD Primary Care Physician   Nicholes Mango MD ELECTRONICALLY SIGNED 04/16/2014 12:44

## 2014-05-02 NOTE — H&P (Signed)
PATIENT NAME:  [name], [name] MR#:  [id number] DATE OF BIRTH:  Sep 18, 1960  DATE OF ADMISSION:  01/16/2014   PRIMARY ONCOLOGIST:  Delight Hoh, MD   CHIEF COMPLAINT: Shortness of breath, weakness,   HISTORY OF PRESENT ILLNESS:  A 54 year old Caucasian female patient with a history of stage IV lung cancer with bone metastasis and possible chemotherapy-induced pneumonitis who was recently treated twice for healthcare-acquired pneumonia, presents to the Emergency Room with worsening shortness of breath and weakness. The patient was discharged from the hospital on 01/06/2014 after being treated for healthcare-acquired pneumonia. She was discharged on moxifloxacin. The patient had a bronchoscopy during that admission growing only Candida. Also, the patient's differential included lymphangitic carcinomatosis or chemotherapy-induced pneumonitis.  The patient has not had any fever, normal white count today. No sick contacts. The patient does not smoke, does not use any inhalers at home. Has chronic chest pain from bone metastasis. Also has chronic nausea or vomiting which is unchanged.   PAST MEDICAL HISTORY:  1.  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 Grayland Ormond.  2.  GERD.  3.  Esophagitis.  4.  Chronic nausea or vomiting.  5.  History of pericardial tamponade.  6.  Remote tobacco abuse.   SOCIAL HISTORY: The patient smoked in the past, but quit 18 years back. Occasional alcohol use. Ambulates on her own. Does not use home oxygen.   CODE STATUS: FULL CODE.   FAMILY HISTORY: Positive for lung cancer in her mother.   ALLERGIES: No known drug allergies.   HOME MEDICATIONS:  2.  Sucralfate 10 mL 4 times a day.  3.  Senna 8.6 mg b.i.d.  4.  Omeprazole 20 mg daily.  5.  Lidocaine topical film once a day.  6.  Hydromorphone 2 mg every 4 hours as needed for pain.  7.  Gabapentin 300 mg 3 times a day. 8.  Fentanyl 75 mcg  every 3 days.  9.  Dronabinol 5 mg 2 times a day.   REVIEW OF SYSTEMS:   CONSTITUTIONAL:  Complaints of severe fatigue, weakness.  EYES: No blurred vision, pain or redness.  ENT: No tinnitus, ear pain, hearing loss.  RESPIRATORY: No cough, wheeze, or shortness of breath.  CARDIOVASCULAR: Has chronic chest pain, no orthopnea, edema.  GASTROINTESTINAL: Has chronic nausea or vomiting. No abdominal pain.  GENITOURINARY: No dysuria, hematuria, or frequency.  ENDOCRINE: No polyuria, nocturia, thyroid problems.  HEMATOLOGIC AND LYMPHATIC: No easy bruising, bleeding. INTEGUMENTARY:  No acne, rash, lesion.  MUSCULOSKELETAL: Has chronic pain.  NEUROLOGIC: No focal numbness. Has generalized weakness.  PSYCHIATRIC: No anxiety and depression.   PHYSICAL EXAMINATION:  VITAL SIGNS: Temperature 97.7, pulse 100, respirations 24; saturating 87% on room air and 95% on 2 liters oxygen.  GENERAL: Moderately built Caucasian female patient lying in bed with some conversational dyspnea.  PSYCHIATRIC: Alert and oriented x 3, pleasant.  HEENT: Atraumatic, normocephalic.  Mucosa moist and pink. Pallor positive. No icterus. Pupils bilaterally equal and reactive to light.  NECK: Supple. No thyromegaly. No palpable lymph nodes. Trachea midline. No carotid bruit, JVD.  CARDIOVASCULAR: S1, S2, without any murmurs. Peripheral pulses 2+. No edema.  RESPIRATORY: Clear to auscultation on both sides.  GASTROINTESTINAL: Soft abdomen, nontender. Bowel sounds present. No organomegaly palpable.  SKIN: Warm and dry. No petechiae, rash, ulcers.  MUSCULOSKELETAL: No joint swelling, redness, effusion of the large joints. Normal muscle tone.  NEUROLOGICAL: Motor strength 5/5 in upper and lower extremities. Sensation  is intact all over.  LYMPHATIC: No cervical lymphadenopathy.   LABORATORY DATA:  Glucose of 99. BNP of 226, BUN 6, creatinine 0.62, sodium 140, potassium 3.5. AST, ALT, alkaline phosphatase, bilirubin normal. Troponin  less than 0.02. WBC 6.4, hemoglobin 12.2, platelets of 157,000.  Recent bronchoscopy BAL showed some Candida. AFB and viral cultures are pending.   EKG shows normal sinus rhythm with incomplete right bundle branch block.   Recent echocardiogram done 2 weeks prior shows ejection fraction of 55% to 60% with impaired relaxation pattern of LV diastolic filling, small posterior pericardial effusion. Normal LV function.   Chest x-ray done today, shows no acute abnormality, stable interstitial lung disease and right upper lobe patchy opacity, bilateral sclerotic expansile rib lesions, unchanged.   ASSESSMENT AND PLAN:  1.  Acute respiratory failure with progressively worsening shortness of breath over 2 months. The patient had therapy twice for healthcare acquired pneumonia.  Today she does not have any fever or white count.  I doubt this is pneumonia. Her patchy opacity from her cancer is stable on the chest x-ray. Would not start any antibiotics. She likely has worsening pneumonitis from her chemotherapy lymphangitic spread of the cancer.  We will start her on some intravenous steroids to see if this helps along with scheduled nebulizer therapy. We will consult oncology for further input regarding the case. Oxygen to keep her saturations over 90%.  2.  The patient had impaired relaxation on her recent echocardiogram with normal ejection fraction. Today, she has elevated BNP but no history of congestive heart failure.  We will give her single dose of IV 40 mg of Lasix to see if she improves with this.  If she does respond well, this can be continued from tomorrow.  3.  Chronic pain syndrome. Continue home medications.  4.  Deep vein thrombosis prophylaxis with Lovenox.   CODE STATUS: FULL CODE.   I have consulted oncology as requested by the family.  The patient's daughter and son are visiting from [place] and would like to speak to oncology regarding her prognosis.   I have explained that Dr. Grayland Ormond is  not on call but they would like to talk to an oncologist if possible.   TIME SPENT: Today on this case was 60 minutes.      ____________________________ Leia Alf Tarra Pence, MD srs:DT D: 01/16/2014 12:34:35 ET T: 01/16/2014 13:04:27 ET JOB#: [id number]  cc: Alveta Heimlich R. Clover Feehan, MD, <Dictator> Kathlene November. Grayland Ormond, MD  Neita Carp MD ELECTRONICALLY SIGNED 01/17/2014 18:45

## 2014-05-02 NOTE — Discharge Summary (Signed)
PATIENT NAME:  [name], [name] MR#:  [id number] DATE OF BIRTH:  01/03/1960  DATE OF ADMISSION:  04/11/2014 DATE OF DISCHARGE:  04/17/2014  ADMITTING PHYSICIAN: Barnetta Chapel P. Volanda Napoleon, MD.  DISCHARGING PHYSICIAN: Gladstone Lighter, MD.   PRIMARY ONCOLOGIST: Kathlene November. Grayland Ormond, [place]:  1. GI consultation by Dr. Candace Cruise.  2. Oncology consultation by Dr. Grayland Ormond.   DISCHARGE DIAGNOSES: 1. Acute on chronic abdominal pain.  2. Acute on chronic recurrent nausea and vomiting, chemotherapy induced.  3. Multiple hospitalizations for nausea and vomiting.  4. Chronic obstructive pulmonary disease.  5. Stage IV lung cancer on chemotherapy.   DISCHARGE HOME MEDICATIONS:  1. Gabapentin 300 mg p.o. 3 times a day.  2. Omeprazole 20 mg p.o. daily.  3. Dronabinol 5 mg p.o. b.i.d.  4. Dilaudid 4 mg every 4 hours p.r.n. for severe pain.  5. Fentanyl patch 100 mcg transdermal every 72 hours.  6. Fentanyl patch 25 mcg every 72 hours transdermal to be used along with the 100 mcg patch.  7. Lidoderm patch 2 patches daily.  8. Combivent Respimat 1 puff 4 times a day.  9. MiraLax powder p.r.n. for constipation.  10. Lasix 20 mg p.o. b.i.d.  11. Scopolamine patch 1.5 mg transdermal every 72 hours.  12. Klor-Con 10 mEq - 4 tablets b.i.d.  13. Metoprolol 25 mg p.o. 3 times a day.  14. Promethazine 25 mg every 6 hours p.r.n. for nausea.  15. Zofran 8 mg every 8 hours p.r.n. for nausea and vomiting.  16. Decadron 4 mg p.o. b.i.d.  17. Reglan 5 mg p.o. 4 times a day prior to meals.   DISCHARGE HOME OXYGEN: 2 liters.   DISCHARGE DIET: Regular diet.   DISCHARGE ACTIVITY: As tolerated.   FOLLOWUP INSTRUCTIONS: 1. Resume home health services.  2. Follow up with Dr. Grayland Ormond next week as per schedule.  3. PCP follow-up in 2 weeks.   LABORATORIES AND IMAGING STUDIES: Prior to discharge: Sodium 139, potassium 4.0, chloride 105, bicarbonate 29, BUN 5, creatinine 0.57, glucose 80 and  calcium 7.9. WBC 4.1, hemoglobin 10.9, hematocrit 34.3, platelet count 207,000. Gastric emptying study was cancelled because the patient could not tolerate. Upper GI endoscopy showing completely normal stomach esophagus, and duodenum.   BRIEF HOSPITAL COURSE: Ms Pinard is a 54 year old, Caucasian female with recent diagnosis of stage IV lung cancer with lymphangitic spread to bones, currently on chemotherapy with Dr. Grayland Ormond and multiple admissions for acute on chronic nausea, vomiting, abdominal pain, presents once again with nausea, vomiting and abdominal pain.  1. Nausea, vomiting, abdominal pain. One of multiple admissions. Actually tenth admission in the last 6 months. The patient was admitted. This time, GI was consulted to see if gastric emptying study could be done, which not successful as the patient could not tolerate the material. Upper GI endoscopy was done, which was completely normal. GI felt that this is a non-GI cause of nausea and vomiting. The patient was continued on her Phenergan, Zofran, Marinol. Reglan was added and Decadron was restarted to see if that would help. She seems like she eats okay, because her weight has been stable. She does not appear cachectic or failure to thrive. She is symptomatic, maybe from her chemotherapy, but she tries to drink Ensure and maintains nutrition. Reassurance was provided to the patient. She was able to eat a regular diet, so she is being discharged home. All her other home medications are being continued.  2. For her chronic abdominal pain, she  takes Fentanyl patch. That was increased from 100 to 125 mcg this admission and also on chronic. Dilaudid.  3. Tachycardia on metoprolol.   Her course has been otherwise uneventful in the hospital.   DISCHARGE CONDITION: Stable.   DISCHARGE DISPOSITION: Home.   TIME SPENT ON DISCHARGE: 40 minutes.     ____________________________ Gladstone Lighter, MD rk:TT D: 04/17/2014 11:31:09 ET T: 04/17/2014  15:04:39 ET JOB#: [id number]  cc: Gladstone Lighter, MD, <Dictator> Kathlene November. Grayland Ormond, MD Gladstone Lighter MD ELECTRONICALLY SIGNED 04/23/2014 15:13

## 2014-05-02 NOTE — H&P (Signed)
PATIENT NAME:  [name], [name] MR#:  [id number] DATE OF BIRTH:  1960-05-06  DATE OF ADMISSION:  03/06/2014  REFERRING PHYSICIAN: Loura Pardon, MD.   FAMILY PHYSICIAN: Nonlocal.   ONCOLOGIST:  Delight Hoh, MD   REASON FOR ADMISSION: Acute respiratory failure with profound anemia.   HISTORY OF PRESENT ILLNESS: The patient is a 54 year old female with multiple medical admissions recently.  She was actually just discharged yesterday with left lower extremity cellulitis on Keflex. She has stage IV metastatic lung cancer with bony metastases. Also has depression, presents to the Emergency Room today with worsening shortness of breath. In the Emergency Room, the patient was noted to be profoundly hypoxic and profoundly anemic.  CT of the chest suggests an enlarging bilateral pleural effusions and possible pneumonia. She is now admitted for further evaluation.   PAST MEDICAL HISTORY:  1.  Recent left lower extremity cellulitis.  2.  COPD.   3.  Stage 4 metastatic lung cancer with bony metastases.  4.  Depression.  5.  GE reflux disease.  6.  Chronic diastolic congestive heart failure.   MEDICATIONS:  1.  Granestinon 1 mg p.o. b.i.d.  2.  Senna 1 p.o. b.i.d. x 3.  3.  Lidoderm patch daily.  4.  Elavil 25 mg p.o. at bedtime.  5.  Transderm scopolamine patch every 3 days.  6.  Carafate 1 gram p.o. before meals and at bedtime.  7.  Zyprexa 2.5 mg p.o. daily.  8.  Gabapentin 300 mg p.o. t.i.d.  9.  Prilosec 20 mg p.o. daily.  10. Dilaudid 4 mg p.o. every 4 hours p.r.n. pain.  11. Dronabinol 5 mg p.o. b.i.d.  12. Duragesic patch 100 mcg topically every 3 days.  13. Combivent 1 puff 4 times daily.  14. Zofran 8 mg p.o. every 8 hours p.r.n.  15. Lopressor 25 mg p.o. every 8 hours.  16. Keflex 500 mg p.o. t.i.d.   ALLERGIES: No known drug allergies.   SOCIAL HISTORY: The patient quit smoking 20 years ago. Denies alcohol abuse.   FAMILY HISTORY: Positive for hypertension, diabetes, breast  cancer, lung cancer and pancreatic cancer.   REVIEW OF SYSTEMS: CONSTITUTIONAL: No fever or change in weight.  EYES: No blurred or double vision. No glaucoma.  ENT: No tinnitus or hearing loss. No nasal discharge or bleeding. No difficulty swallowing.  RESPIRATORY: The patient has had cough, but denies wheezing or hemoptysis. No painful respiration.  CARDIOVASCULAR: No chest pain or syncope. No palpitations.  GASTROINTESTINAL: The patient has had nausea but no vomiting or diarrhea. Denies abdominal pain or change in bowel habits.  GENITOURINARY: No dysuria or hematuria. No incontinence.  ENDOCRINE: No polyuria or polydipsia. No heat or cold intolerance.  HEMATOLOGIC: The patient admits to anemia from her cancer, but denies bruising or bleeding.  LYMPHATIC: No swollen glands.  MUSCULOSKELETAL: The patient does have pain in her back and neck. No gout.  NEUROLOGIC: No numbness or migraines. Denies stroke or seizures.  PSYCHOLOGICAL: The patient denies anxiety, insomnia or depression.   PHYSICAL EXAMINATION:  GENERAL: The patient is extremely anxious, complaining of shortness of breath and thirst.  VITAL SIGNS: Remarkable for a blood pressure of 125/97 with a heart rate of 121, respiratory rate of 26, temperature of 98, saturation of 100% on 2 liters.  HEENT: Normocephalic, atraumatic. Pupils equally round and reactive to light and accommodation. Extraocular movements are intact. Sclerae not icteric. Conjunctivae are clear.  OROPHARYNX: Dry, but clear.  NECK: Supple without JVD. No adenopathy  or thyromegaly is noted.  LUNGS: Revealed decreased breath sounds with basilar rhonchi and rales bilaterally. Respiratory effort is increased. No wheezing is noted.  CARDIAC: Rapid rate with a regular rhythm. Normal S1 and S2. No significant rubs or gallops. PMI is nondisplaced. Chest wall is nontender.  ABDOMEN: Soft, nontender, with normoactive bowel sounds. No organomegaly or masses were appreciated. No  hernias or bruits were noted.  EXTREMITIES: Without clubbing, cyanosis, edema. Pulses were 2+ bilaterally.  SKIN: Warm and dry without rash or lesions.  NEUROLOGIC: Cranial nerves II through XII grossly intact. Deep tendon reflexes were symmetric. Motor and sensory examination is nonfocal.  PSYCHIATRIC: Revealed a patient who is alert and oriented to person, place, and time. She was cooperative and used good judgment.   LABORATORY DATA: EKG revealed sinus tachycardia with no acute ischemic changes. CT of the chest revealed bilateral air space opacities with some consolidation.  New large right pleural effusion and a moderate to large left pleural effusion were noted. No PE was present. Her white count was 4.0 with a hemoglobin of 5.2.  Glucose 106 with a BUN of 5, creatinine of 0.46 with a potassium of 3.3.   ASSESSMENT:  1.  Acute respiratory failure requiring oxygen.  2.  Enlarging pleural effusions.  3.  Presumed pneumonia.  4.  Profound anemia.  5.  Stage 4 metastatic lung cancer.  6.  Hypokalemia.  7.  Anxiety-depression.   PLAN: The patient will be admitted to the floor as a FULL CODE with oxygen. We will give IV Solu-Medrol and IV Lasix once now empirically.  Begin IV antibiotics and oxygen. We will transfuse 2 units of packed red blood cells. We will supplement potassium. We will consult oncology and pulmonary.  Follow up labs and chest x-ray in the morning. The patient is a FULL CODE according to her wishes.  Her prognosis is extremely poor.  We will guaiac all stools while on the medical floor.   TOTAL TIME SPENT ON THIS PATIENT: 50 minutes.      ____________________________ Leonie Douglas Doy Hutching, MD jds:DT D: 03/06/2014 15:12:46 ET T: 03/06/2014 16:01:42 ET JOB#: [id number]  cc: Leonie Douglas. Doy Hutching, MD, <Dictator> JEFFREY Lennice Sites MD ELECTRONICALLY SIGNED 03/06/2014 20:23

## 2014-05-02 NOTE — Consult Note (Signed)
PATIENT NAME:  [name], [name] MR#:  [id number] DATE OF BIRTH:  04/07/1960  DATE OF CONSULTATION:  03/07/2014  REFERRING PHYSICIAN:  Leonie Douglas. Doy Hutching, MD  CONSULTING PHYSICIAN:  Aireonna Bauer R. Ma Hillock, MD  REASON FOR CONSULTATION: Anemia, metastatic lung cancer.   HISTORY OF PRESENT ILLNESS: The patient is a 54 year old female with a past medical history significant for stage IV metastatic non-small cell lung cancer with bone metastasis, lymphangitic spread of cancer, who recently has been started on treatment with targeted therapy nivolumab, the patient states that she has received about 4 doses so far. She had recent hospitalization for cellulitis in the lower extremities. Now she is currently admitted with progressive dyspnea on exertion and acute respiratory failure. CT scan of the chest shows enlarging bilateral pleural effusions, lung opacities, and she was hypoxic on admission. Currently states that she is still weak and dyspneic intermittently at rest and on minimal activity, but overall slightly better. She reportedly had drop in hemoglobin but a lab sample from yesterday afternoon upon the time hemoglobin was supposed to be very low has not been reported, and repeat hemoglobin yesterday at 3:00 p.m. was fairly steady at 9.7 and today it is 9.0. She denies any obvious bleeding issues. She states that pain is overall under fairly good control.   PAST MEDICAL HISTORY: 1.  Stage IV lung cancer as described above.  2.  COPD.  3.  Depression.  4.  GERD.  5.  Chronic diastolic congestive heart failure.  6.  Recent left lower extremity cellulitis.   FAMILY HISTORY: Remarkable for diabetes, hypertension, breast cancer, lung cancer, pancreatic cancer.   SOCIAL HISTORY: Quit smoking 20 years ago. Denies alcohol usage.   ALLERGIES: No known drug allergies.   HOME MEDICATIONS: Elavil 25 mg at bedtime, transdermal scopolamine patch every 3 days, Lidoderm patch daily, Senna 1 p.o. b.i.d., Zyprexa 2.5  mg daily, gabapentin 300 mg t.i.d., Prilosec 20 mg daily, Dilaudid 4 mg q. 4 hours p.r.n., dronabinol 5 mg b.i.d., Duragesic patch 100 mcg every 3 days, Combivent 1 puff 4 times daily, Zofran 8 mg q. 8 hours p.r.n., Lopressor 25 mg q. 8 hours, Keflex 500 mg t.i.d.   REVIEW OF SYSTEMS:  CONSTITUTIONAL: As in HPI. No fever or chills currently.  HEENT: Denies any headaches or dizziness at rest. No epistaxis, ear or jaw pain.  CARDIAC: No angina, palpitation, orthopnea, or PND.  LUNGS: As in HPI. No hemoptysis.  GASTROINTESTINAL: No nausea, vomiting, or diarrhea. No bright red blood in stools or melena.  GENITOURINARY: No dysuria or hematuria.  EXTREMITIES: Recent cellulitis symptoms improving. Still has swelling.  SKIN: No new rashes or pruritus otherwise.  MUSCULOSKELETAL: No new bone pains.  NEUROLOGIC: No new focal weakness, seizures, or loss of consciousness.   PHYSICAL EXAMINATION:  GENERAL: The patient is weak and tired looking, sitting in bed on nasal cannula oxygen. Otherwise alert and oriented and converses appropriately. No acute distress at rest.  VITAL SIGNS: Temperature 97.7, heart rate 118, respiratory rate 19, blood pressure 110/60, saturation 94% on 2 L.  HEENT: Normocephalic, atraumatic. Extraocular movements intact. Sclerae anicteric.  NECK: Negative for lymphadenopathy.  CARDIOVASCULAR: S1, S2, regular rate and rhythm.  LUNGS: Show bilateral diminished breath sounds overall. No crepitations or rhonchi.  ABDOMEN: Soft, nontender, bowel sounds present.  EXTREMITIES: Show bilateral mild edema.  NEUROLOGIC: Limited exam. Cranial nerves intact. Moves all extremities spontaneously.   LABORATORY RESULTS: WBC 4700, hemoglobin 9.0, platelets 293,000, ANC 3400. Creatinine 0.54, potassium 3.3, calcium 7.7,  bilirubin 0.4, alkaline phosphatase 54, ALT 10, AST 24, albumin low at 2.1. INR 1.1, PTT 24.9. Urine culture negative so far.   IMPRESSION AND RECOMMENDATIONS: A 54 year old female  patient with a known history of metastatic stage IV non-small cell lung cancer, more recently started on targeted therapy with nivolumab. The patient has recurrent hospitalization now for complaints of progressive dyspnea and acute respiratory failure. CT scan of the chest on March 5 reports increasing bilateral airspace opacity with consolidation, nodular and interstitial opacities, probably representing combination of known tumor, lymphangitic tumor spread, pulmonary edema and infection. There is new large right pleural effusion and moderate to large left pleural effusion. I have explained CT scan findings with the patient and reviewed films with her today, and that she most likely has progression of her lung cancer along with possible component of diastolic heart failure and respiratory tract infection. I agree with ongoing supportive treatment with broad-spectrum antibiotic, oxygen, bronchodilator support. We will request her primary oncologist, Dr. Grayland Ormond, to follow up on Monday onwards and see if her dyspnea does not improve then she may need thoracentesis for relief. No major pain issues at this time. The patient understands that if she has progression of malignancy, then overall prognosis is poor. Oncology will continue to follow.   Thank you for the referral. Please feel free to contact me for additional questions.   ____________________________ Rhett Bannister Ma Hillock, MD srp:ST D: 03/07/2014 23:11:13 ET T: 03/07/2014 23:38:23 ET JOB#: [id number]  cc: Trevor Iha R. Ma Hillock, MD, <Dictator> Alveta Heimlich MD ELECTRONICALLY SIGNED 03/09/2014 15:46

## 2014-05-02 NOTE — Consult Note (Signed)
PATIENT NAME:  [name], [name] MR#:  [id number] DATE OF BIRTH:  07/17/1960  DATE OF CONSULTATION:  01/21/2014  REFERRING PHYSICIAN:   Corrin Parker, MD, and  Lloyd Huger, MD  CONSULTING PHYSICIAN:  Lew Dawes. Genevive Bi, MD  REASON FOR CONSULTATION:   Lung biopsy.   I have personally seen and examined [name]. I have independently reviewed her films. I have discussed her care with our radiologist, Dr. Grayland Ormond and Dr. Mortimer Fries.  I spent approximately 1 hour, more than half the time was spent in consultation regarding management of her right lung abnormalities.   HISTORY OF PRESENT ILLNESS:  This patient is a 54 year old woman who presented about a year ago to [place] for management of a complex right-sided pulmonary infiltrate. The patient underwent a right-sided thoracoscopy and had a diagnosis made of a stage IV carcinoma of the lung. I did have an opportunity to review her operative reports. They did not place a Pleurx catheter or a talc  pleurodesis. 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 Over the last several months, she has been admitted to our hospital for increasing shortness of breath and underwent a bronchoscopy as well as multiple a CT scans showing progression of a parenchymal infiltrate in the right lung that was felt to be either pneumonitis, pneumonia or lymphangitic spread of tumor. The patient was seen by Dr. Mortimer Fries, who felt that another bronchoscopy was not warranted and he recommended that she undergo an open lung biopsy. I discussed her care extensively with Dr. Grayland Ormond, who felt that this would be a reasonable approach.   I had a long discussion today with [name] about this. She understands that because of her previous thoracoscopy, she may require a thoracotomy. She knows that on the recent CT, she did have a small to moderate size right-sided pleural effusion which is no  longer present. I told her that if there was indeed evidence of intrapleural disease that I would recommend we perform either a talc pleurodesis or a Pleurx catheter insertion. She understands that that decision will be made at the time of surgery. In addition, I told her that she may need to have a thoracotomy because of the prior thoracoscopy, but I did not see any evidence of a pleurodesis having been performed in the past.   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 Grayland Ormond, with chemotherapy. She has had a Port-A-Cath placed on the right. She has a history of reflux esophagitis, chronic pain syndrome for which she takes multiple medications and a history of pericardial tamponade.   SOCIAL HISTORY: The patient smoked in the past but quit many years ago.   FAMILY HISTORY: She does have a positive family history of lung cancer in her mother.   REVIEW OF SYSTEMS: Significant for chronic pain, increasing shortness of breath, no hemoptysis, all others were negative.   PHYSICAL EXAMINATION: GENERAL: Revealed a thin, pleasant, elderly female who appeared her stated age.  CHEST: She did have a Port-A-Cath present in the right anterior chest wall.  LUNGS: Showed diminished breath sounds throughout.  HEART: Her heart was regular.  ABDOMEN: Her abdomen was soft and nontender. There were no palpable masses. There was no hepatosplenomegaly.  EXTREMITIES: Her extremities were without clubbing, cyanosis, or edema.   ASSESSMENT AND PLAN: Likely lymphangitic spread of tumor in the right  lung. She does have evidence of progression on recent CT scans. We will go ahead and plan on a right-sided thoracoscopy and probable thoracotomy. I discussed with her the options, she understands and would like to proceed.   Thank you very much for allowing me to participate in her care today.    ____________________________ Lew Dawes. Genevive Bi, MD teo:nt D: 01/21/2014 15:27:01 ET T: 01/21/2014 15:54:33 ET JOB#: [id number]  cc: Christia Reading E. Genevive Bi, MD, <Dictator> Louis Matte MD ELECTRONICALLY SIGNED 02/15/2014 9:55

## 2014-05-02 NOTE — H&P (Signed)
PATIENT NAME:  [name], [name] MR#:  [id number] DATE OF BIRTH:  September 12, 1960  DATE OF ADMISSION:  01/31/2014  ADMITTING PHYSICIAN: Gladstone Lighter, MD  PRIMARY CARE PHYSICIAN AND PRIMARY ONCOLOGIST: Kathlene November. Grayland Ormond, MD   CHIEF COMPLAINT: Generalized weakness and also dyspnea.   HISTORY OF PRESENT ILLNESS: [name] is a 54 year old Caucasian female with diagnosis of stage IV right lung adenocarcinoma in January 2015. Finished radiation and currently undergoing chemotherapy, history of GERD, candidal esophagitis, chronic nausea and vomiting since being diagnosed with lung cancer, history of pericardial tamponade, status post pericardial window placement last year who was just discharged from the hospital on 01/28/2014, comes back again secondary to worsening weakness. This is the patient's recurrent hospitalization in the last few months. The patient says she felt that she was not quite ready 3 days ago when she was discharged. She has extreme weakness before last admission as well, but she was able to get around the house, do her own things. But since her last discharge 3 days ago, the patient says that she has been so weak that she could not even get to the kitchen to eat or drink, though her family lives close, they come in and out through the day. She had trouble going to the bathroom as well. She has a cane and a walker and she was discharged with home health. She said she always has dyspnea since her lung cancer diagnosis; however, that seemed to be slightly worse since the discharge. CT here notes that her right-sided interstitial lung disease has progressed. She has an elevated white count. Of course, she was also on steroids. Also, she has a small pneumothorax likely from her open lung biopsy done about a week ago for her interstitial lung disease by Dr. Genevive Bi. She complains of mild pleuritic chest pain, low-grade fever today. Her main complaint is the weakness. She feels also a lot of changes  have been done to her pain medications and she thinks that could be the reason why she has been so weak. She has already been on the fentanyl patch, Lidoderm patch in the past, but her gabapentin dose was increased. She said she has not been taking the as needed dilaudid thinking that would make her more sleepy. Her weakness is also compounded by significant dizziness, unsteadiness, and lightheadedness whenever she tries to stand up. Her nausea and vomiting have significantly improved with all the medication changes, though.   PAST MEDICAL HISTORY: 1.  Stage IV right-sided lung adenocarcinoma with bone mets, mets to the right hip, ribs, and also some in the spine. Status post radiation, currently on chemotherapy.  2.  Gastroesophageal reflux disease.  3.  Candida esophagitis.  4.  Chronic nausea and vomiting.  5.  History of pericardial tamponade.   PAST SURGICAL HISTORY: 1.  Pericardial window placement for tamponade.  2.  Open lung biopsy.  3.  VATS in the past right-sided interstitial lung disease.   ALLERGIES TO MEDICATIONS: No known drug allergies.  CURRENT HOME MEDICATIONS: Include: 1.  Amitriptyline 25 mg p.o. at bedtime.  2.  Dronabinol 5 mg p.o. b.i.d.  3.  Fentanyl 75 mcg transdermal patch every 72 hours.  4.  Neurontin 600 mg p.o. 3 times a day.  5.  Gabapentin 600 mg at bedtime.  6.  Granisetron 1 mg every 12 hours.  7.  Hydromorphone 4 mg every 4 hours p.r.n. for severe pain.  8.  A 5% Lidoderm patch, apply daily.  9.  Olanzapine 2.5  mg daily.  10.  Omeprazole 20 mg p.o. daily.  11.  Prednisone taper over 10 days that she was discharged on.  12.  Prochlorperazine 10 mg q.6 hours p.r.n. for nausea and vomiting.  13.  Scopolamine patch every 3 days.  14.  Senokot 1 tablet p.o. b.i.d.  15.  Sucralfate 1 g 10 mL orally 4 times a day.  16.  Voriconazole 200 mg every 12 hours.   SOCIAL HISTORY: Lives at home by herself, has a cane and a walker, is being followed by home  health. Quit smoking more than 19 years ago. Occasional alcohol use. Not on any home oxygen, unable to ambulate currently.   FAMILY HISTORY: Mother with lung cancer and breast cancer, were currently living. Father with prostate cancer, again currently living.   REVIEW OF SYSTEMS: CONSTITUTIONAL: No fever, but positive for fatigue and weakness.  EYES: No blurred vision, double vision, inflammation, or glaucoma.  EARS, NOSE, AND THROAT: No tinnitus, ear pain, hearing loss, epistaxis, or discharge.  RESPIRATORY: Positive for cough. No wheeze. No COPD. No hemoptysis. Positive for dyspnea.  CARDIOVASCULAR: Right-sided pleuritic chest pain. No orthopnea, edema, arrhythmia, palpitations, or syncope.  GASTROINTESTINAL: Nausea and vomiting have improved. No diarrhea, abdominal pain, hematemesis, or melena.  GENITOURINARY: No dysuria, hematochezia, renal calculus, frequency, or incontinence.  ENDOCRINE: No polyuria, nocturia, thyroid problems, heat or cold intolerance.  HEMATOLOGY: No anemia, easy bruising or bleeding.  SKIN: No acne, rash, or lesions.  MUSCULOSKELETAL: No neck fracture, pain, arthritis, or gout.  NEUROLOGIC: No numbness, weakness, CVA, TIA, or seizures.  PSYCHOLOGICAL: No anxiety, insomnia, depression.   PHYSICAL EXAMINATION: VITAL SIGNS: Temperature 99 degrees Fahrenheit, pulse 117, respirations 20, blood pressure 136/90, pulse of 96% on room air.  GENERAL: Well-built, well-nourished female sitting in bed, not in any acute distress.  HEENT: Normocephalic, atraumatic. Pupils equal, round, reacting to light. Anicteric sclerae. Extraocular movements intact. Oropharynx clear without erythema, mass, or exudates. Dry mucous membranes.  NECK: Supple. No thyromegaly, JVD, or carotid bruits. No lymphadenopathy.  LUNGS: Moving air bilaterally. Decreased bibasilar breath sounds. No crackles. No use of accessory muscles for breathing. On the right side of the chest wall, recent incision with  sutures in place at 4 spots noted. Local erythema and ecchymosis noted as well.  CARDIOVASCULAR: S1, S2, regular rate and rhythm. No murmurs, rubs, or gallops.  ABDOMEN: Soft, nontender, nondistended. No hepatosplenomegaly. Normal bowel sounds.  EXTREMITIES: No pedal edema. No clubbing or cyanosis. 2+ dorsalis pedis pulses palpable bilaterally.  SKIN: No acne, rash, or lesions.  LYMPHATICS: No cervical lymphadenopathy.  NEUROLOGIC: Cranial nerves intact. Motor strength is decreased in proximal lower extremity muscles, but able to move against gravity. Sensation is intact. No focal cerebellar function abnormality noted.  PSYCHOLOGIC: The patient is awake, alert, oriented x3.   LABORATORY DATA: WBC 18.9, hemoglobin 12.1, hematocrit 37.6, platelet count 150,000.   Sodium 139, potassium 4.7, chloride 102, bicarbonate 27, BUN 0.6, glucose 147, calcium of 8.3.   ALT 38, AST 28, alkaline phosphatase 57, total bilirubin 0.4, albumin of 2.6. Troponin is negative.  CT of the lumbar and thoracic spine was done. No evidence of metastatic disease to thoracic or lumbar spine. No central stenosis noted. No evidence of cord compression or disk herniations noted. No neuroforaminal narrowing noted. Significant abnormality in the lung with right greater than left diffuse interstitial thickening and areas of hazy and patchy airspace opacity noted. They seem to be increased from prior CT and pneumothorax on the right side  with subcutaneous emphysema is consistent with air leak.  Chest x-ray done today showing increase in size of small right upper hemithorax pneumothorax, significant increase in subcutaneous air on the right side consistent with air leak and no other changes are noted.   ASSESSMENT AND PLAN: A 54 year old female with a history of stage IV right-sided lung cancer on chemotherapy presents with worsening weakness and dyspnea, and was just discharged from the hospital 3 days ago.  1.  Early sepsis with  leukocytosis, tachycardia, and CT of the lumbar spine and thoracic spine indicating worsening of the airspace disease. It could be the leukocytosis could be from steroids. She was diagnosed with interstitial lung disease/pneumonitis, for which reason she just had the biopsy done. However, with her tachycardia, low-grade temperature, we will admit her, give IV fluids. Blood cultures have been ordered. Started on Levaquin. Continue her inhalers and continue her prednisone taper.  2.  Generalized weakness. Noted to be more weak in her lower extremities. She was just discharged with home health. Might need rehab. Physical therapy consult. Also, the patient feels her increased pain medications could have caused it, though she is not drowsy. Decreased her gabapentin dose. She is on chronic fentanyl and Lidoderm, which she will continue. Palliative care consult to help with pain medications.  3.  Right-sided pneumothorax from recent procedure . Surgical consult. Mostly monitoring. Chest x-ray in a.m.  4.  Lung cancer, stage IV adenocarcinoma of the right lung. started on chemotherapy lately, has been hospitalized multiple times. Oncology consultation.  5.  Gastroesophageal reflux disease and candidal esophagitis. Continue home medications.  6.  Deep vein thrombosis prophylaxis. 7.  CODE STATUS: FULL CODE.  TIME SPENT ON ADMISSION: 50 minutes.   ____________________________ Gladstone Lighter, MD rk:sw D: 01/31/2014 12:17:55 ET T: 01/31/2014 12:39:14 ET JOB#: [id number]  cc: Gladstone Lighter, MD, <Dictator> Kathlene November. Grayland Ormond, MD Gladstone Lighter MD ELECTRONICALLY SIGNED 02/08/2014 16:57

## 2014-05-02 NOTE — Op Note (Signed)
PATIENT NAME:  [name], KAIGLER MR#:  [id number] DATE OF BIRTH:  1960/07/09  DATE OF PROCEDURE:  01/25/2014  PREOPERATIVE DIAGNOSIS:  Interstitial lung disease.   POSTOPERATIVE DIAGNOSIS:  Interstitial lung disease.   OPERATION PERFORMED: 1.  Preoperative bronchoscopy to assess endobronchial anatomy.  2.  Right thoracoscopy with pleural biopsy and right middle lobe and right lower lobe lung biopsies.   INDICATIONS FOR PROCEDURE:  [name] is a 54 year old woman with a prior history of lung cancer, who has been admitted to the hospital on several occasions with increasing shortness of breath and evidence of a pneumonic process involving the right lung. She was offered the above-named procedure for definitive diagnosis. The indications and risks were explained to the patient, who gave her informed consent.   DESCRIPTION OF PROCEDURE:  The patient was brought to the operating suite and placed in the supine position. General endotracheal anesthesia was given through a double-lumen tube. Preoperative bronchoscopy was carried out and was normal to the subsegmental levels bilaterally. The patient was then turned for a right thoracoscopy. All pressure points were carefully padded. The patient was prepped and draped in the usual sterile fashion. We made 3 thoracoscopic ports on the right chest wall. The first port was made anteriorly and was deepened down through the muscles of the chest wall until the pleural space was entered. Once this was completed, we could then see that the pleural space was relatively free, and 2 additional ports were created, one inferiorly and one posteriorly. These roughly paralleled our prior thoracoscopy ports. The scope was introduced through our most inferior port, and the pleural space appeared grossly within normal limits. I did not see any evidence of metastatic disease in the pleura. The pleural biopsies were obtained randomly throughout the posterior aspect of the right chest  wall. We then took a piece of the right middle lobe anteriorly and a piece of the right lower lobe posteriorly. These were divided with the staple line going for culture and the remaining portion to go for pathology. Because there was no evidence of intrapleural disease, we did not perform a talc pleurodesis or a Pleurx catheter. A single chest tube was inserted through our most anterior incision and positioned to the apex of the chest. All of the wounds were then closed with multiple layers using running absorbable sutures on the muscle and subcutaneous tissues and nylon on the skin. The chest tube was secured with silk and connected. The patient was then rolled in the supine position, where she was extubated and taken to the recovery room in stable condition.   ____________________________ Lew Dawes Genevive Bi, MD teo:nb D: 01/25/2014 16:35:05 ET T: 01/25/2014 17:06:17 ET JOB#: [id number]  cc: Lew Dawes. Genevive Bi, MD, <Dictator> Louis Matte MD ELECTRONICALLY SIGNED 02/15/2014 9:56

## 2014-05-02 NOTE — Consult Note (Signed)
PATIENT NAME:  [name], [name] MR#:  [id number] DATE OF BIRTH:  26-May-1960  DATE OF CONSULTATION:  04/11/2014  REFERRING PHYSICIAN:   CONSULTING PHYSICIAN:  Lupita Dawn. Cherril Hett, MD  REASON FOR REFERRAL: Recurrent nausea, vomiting, abdominal pain and diarrhea.   DESCRIPTION: The patient is a 54 year old female with lung cancer with metastases to bones, who comes back with acute nausea, vomiting, diarrhea, and abdominal pain. She has had multiple episodes of watery stool before she came back to the Emergency Room. She also had several bouts of vomiting, as well. She has a recurrent bout of abdominal pain mainly in the epigastric region. She has had multiple admissions over the past 6 months, and the last 2 admissions have been related to the same GI complaints.   PAST MEDICAL HISTORY: Notable for stage IV metastatic lung cancer. She has COPD and reflux disease, which was confirmed by an endoscopy done a few years ago. She also has congestive heart failure, history of pericardial tamponade.   SOCIAL HISTORY: She lives alone. Denies any alcohol or tobacco.   PAST SURGICAL HISTORY: Includes lung biopsy, thoracotomy, bladder surgery, and Port-A-Cath placement.   FAMILY HISTORY: Notable for heart disease.   ALLERGIES: She has no known drug allergies.   HOME MEDICATIONS: Are numerous including scopolamine patch, Phenergan, Zofran, Prilosec once a day, metoprolol 3 times a day, potassium twice a day, hydromorphone, gabapentin, Lasix twice a day, fentanyl patch, dronabinol inhalers.   REVIEW OF SYMPTOMS: Please refer to one that was done dictated by Dr. Volanda Napoleon. There are no changes.   PHYSICAL EXAMINATION:  GENERAL: The patient is in no acute distress, although she does look fatigued. She says she feels awful still. VITAL SIGNS: She is afebrile. Her vital signs are stable.  CARDIAC: Revealed regular rhythm and rate.  LUNGS: Clear bilaterally.  ABDOMEN: Showed normoactive bowel sounds, soft. There is diffuse  tenderness throughout, especially in the mid epigastric area and there is no hepatomegaly.  EXTREMITIES: No clubbing, cyanosis, or edema.  SKIN: Negative.  NEUROLOGIC: Negative.   LABORATORIES: Sodium was 138, potassium was only 2.1 on admission. This was on April 6. On readmission, potassium is up to 3.1. Liver enzymes are normal. Troponin level is normal. White count is normal at 10.4. Hemoglobin 12.9.   Urinalysis is negative.   ASSESSMENT AND PLAN: This is a patient with metastatic lung cancer with recurrent bouts of nausea, vomiting, diarrhea and abdominal pain. It may be related to her chemotherapy, although the last one was given 3 weeks ago. We need to check stool for Clostridium difficile because she has been on antibiotics. Clostridium difficile can certainly cause abdominal pain, nausea, vomiting, and diarrhea. If it is positive, then she needs to be treated for Clostridium difficile. She is already on multiple nausea medications. Because of a history of reflux esophagitis, I would recommend that she be on a proton pump inhibitor twice a day instead of once a day. If the stools are negative and the diarrhea persists, it is reasonable to start on Imodium or Lomotil p.r.n. to control the diarrhea. If her symptoms do not improve over the next several days, could consider repeating the upper endoscopy and/or schedule a colonoscopy for her. She has never had a colonoscopy in the past.   Thank you for the referral.     ____________________________ Lupita Dawn. Candace Cruise, MD pyo:JT D: 04/12/2014 11:01:51 ET T: 04/12/2014 11:14:21 ET JOB#: [id number]  cc: Lupita Dawn. Candace Cruise, MD, <Dictator> Lupita Dawn Linell Shawn MD ELECTRONICALLY SIGNED 04/12/2014  14:36 

## 2014-05-02 NOTE — Consult Note (Signed)
PATIENT NAME:  [name], DRONE MR#:  [id number] DATE OF BIRTH:  19-Feb-1960  DATE OF CONSULTATION:  01/18/2014  REFERRING PHYSICIAN:   CONSULTING PHYSICIAN:  Cheral Marker. Ola Spurr, MD  REQUESTING PHYSICIAN:  Dr. Verdell Carmine.   REASON FOR CONSULTATION: Lung cancer, hypoxia, and cough.   HISTORY OF PRESENT ILLNESS: A 54 year old female with stage 4 lung cancer whom I saw during  her recent admission when she was hospitalized December 29, through January, with progressive cough and shortness of breath. She had a workup at that time including CT scans of her chest and was seen by pulmonary. She underwent bronchoscopy on the day of discharge, January 6, cultures from that were negative including AFB, however fungal cultures were growing candida and another yeast.  The patient was readmitted January 16 with increasing shortness of breath, cough, as well as nausea and vomiting. She feels quite weak. She has not had very much oral intake. Since admission the patient has been on antibiotics. She has had decreased saturations and is unable to ambulate at this point without oxygen.   PAST MEDICAL HISTORY:  1.  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   2.  Possible lymphangitic spread of lung cancer versus pneumonitis secondary to chemotherapy. 3.  Esophagitis.  4.  History of chronic nausea and vomiting.  5.  GERD.  6.  History of pericardiac tamponade.  7.  Prior tobacco abuse.   SOCIAL HISTORY: The patient smoked but quit 18 years ago. Occasional alcohol use. Does not use home oxygen.   FAMILY HISTORY: Positive for lung cancer.   CODE STATUS:  Full code.    ALLERGIES: No known drug allergies.   ANTIBIOTICS SINCE ADMISSION: None.   REVIEW OF SYSTEMS: 11 systems reviewed and negative except as per HPI.    PHYSICAL EXAMINATION:  VITAL SIGNS: Temperature 97.6, pulse 99, blood pressure 119/81, respirations 20, saturation 94% on 2 liters, per nursing she was 85% with ambulation off  of oxygen.  GENERAL: She is pleasant, interactive, somewhat frail-appearing.  HEENT: Pupils are reactive.  OROPHARYNX: Clear with no thrush.  NECK: Supple.  HEART: Regular.  LUNGS: Coarse breath sounds bilaterally.  ABDOMEN: Soft, nontender, nondistended. No hepatosplenomegaly.  EXTREMITIES: No clubbing, cyanosis, or edema.  NEUROLOGIC: She is alert and oriented x 3, grossly nonfocal neurologic exam.   LABORATORY DATA: Cultures from January 5 bronchoscopy negative AFB, viral cultures negative to date, fungal culture grew Candida and another fungus which is being further identified at the state laboratory. Blood cultures January 16, are negative; white blood count on admission 6.4, hemoglobin 12.2, platelets 157,000. Albumin is low at 2.7, otherwise LFTs normal. Renal function shows a creatinine of 0.62.   IMAGING: Chest x-ray January 16 showed no acute abnormality. There was stable interstitial lung disease and right upper lobe patchy opacity. Bilateral sclerotic expansile rib lesions have not changed significantly.   IMPRESSION:  A 54 year old female with history of stage IV lung cancer, admitted with progressive shortness of breath. She has had a workup extensively on her last admission with a BAL and CT scan, although I do not see any results of a bronchoscopy biopsy.   RECOMMENDATIONS:  1.  Continue to monitor antibiotics.   2.  Agree with steroids if indicated.  3.  Have to review to see if a biopsy was done. Hopefully we can differentiate lymphangitic spread of tumor from a side effect of the immunomodulatory therapy she was on. If she has side effects with pneumonitis she may respond  to a higher and longer course of steroids. She clinically reports feeling somewhat better when she was on the steroids at her last admission. 4.  If a biopsy has not been done I would recommend repeat bronchoscopy with biopsy.  5.  Thank you for the consult. I will be glad to follow with you.       ____________________________ Cheral Marker. Ola Spurr, MD dpf:bu D: 01/18/2014 20:15:12 ET T: 01/18/2014 21:09:46 ET JOB#: [id number]  cc: Cheral Marker. Ola Spurr, MD, <Dictator> Maryann Mccall Ola Spurr MD ELECTRONICALLY SIGNED 01/27/2014 21:02

## 2014-05-02 NOTE — Consult Note (Signed)
Still with nausea. COuld not tolerate white eggs for gastric emptying scan few days ago. EGD looked completely normal. Gastric bx taken. No obvious GI condition to account for pt's nausea. Pt already on protonix bid. Could try low dose reglan but reglan interacts with phenergan. Nothing else to offer. Will sign off. Call us back if situation changes. thanks  Electronic Signatures: Verdie Shire (MD)  (Signed on 14-Apr-16 11:45)  Authored  Last Updated: 14-Apr-16 11:45 by Verdie Shire (MD)

## 2014-05-02 NOTE — Discharge Summary (Signed)
PATIENT NAME:  [name], [name] MR#:  [id number] DATE OF BIRTH:  September 22, 1960  DATE OF ADMISSION:  04/07/2014 DATE OF DISCHARGE:  04/08/2014  DISCHARGE DIAGNOSES:  1.  Chronic nausea, vomiting.  2.  Hypokalemia.  3.  Hypomagnesemia.  4.  Dehydration.  5.  Chronic pain syndrome.  6.  Lung cancer with metastasis.   DISCHARGE MEDICATIONS: Refer to medication reconciliation.   DISCHARGE INSTRUCTIONS: Regular food of regular consistency. Activity as tolerated. Follow up with Dr. Grayland Ormond in 1 week.   IMAGING STUDIES INCLUDE: An abdomen and chest x-ray which showed diffuse bilateral air space opacity, stable, from lung cancer. Nonobstructive bowel gas pattern and right nephrolithiasis.  CONSULTATIONS: Kathlene November. Grayland Ormond, M.D. with oncology.   ADMITTING HISTORY AND PHYSICAL:  Please see detailed H and P dictated by Dr. Manuella Ghazi. In brief, a 54 year old female patient with known history of metastatic lung cancer with lymphangitic spread, chronic nausea, vomiting. Returned to the Emergency Room again with nausea, vomiting, abdominal pain. The patient has had multiple admissions this year totaling to 9 admissions.  Patient with dehydration, electrolyte abnormalities, dehydration, nausea, vomiting.  Was admitted to hospitalist service.   HOSPITAL COURSE:  Intractable nausea, vomiting. The patient has had chronic symptoms due to this. This is thought to be likely immune modulator from her chemotherapy for the lung cancer. Was started on Decadron IV along with scheduled antiemetics. The patient improved well. She did tolerate food prior to her discharge, felt comfortable home.  Was started on a Decadron taper and discharged home in a stable condition.   Prior to discharge, the patient's abdomen is nontender.  S1, S2 heard. Lungs sound clear. The patient is high risk for readmission considering her multiple admissions this year.   Time spent today on day of discharge in discharge activity was 35  minutes.   ____________________________ Leia Alf Cataldo Cosgriff, MD srs:sp D: 04/12/2014 13:20:47 ET T: 04/12/2014 17:50:30 ET JOB#: [id number]  cc: Alveta Heimlich R. Briggs Edelen, MD, <Dictator> Kathlene November. Grayland Ormond, MD Neita Carp MD ELECTRONICALLY SIGNED 04/26/2014 10:49

## 2014-05-02 NOTE — Consult Note (Signed)
PATIENT NAME:  [name], [name] MR#:  [id number] DATE OF BIRTH:  September 11, 1960  DATE OF CONSULTATION:  03/07/2014  PATIENT OF:  Leonie Douglas. Doy Hutching, MD  CONSULTING PHYSICIAN:  Lollie Sails, MD  REASON FOR CONSULTATION: Anemia.   HISTORY OF PRESENT ILLNESS: [name] is a very pleasant 54 year old Caucasian female with history of metastatic lung cancer. She states that she was recently in the hospital and was discharged home on oxygen this past Thursday. She was not in the hospital for GI-related issue. She states that this morning she became short of breath. She was having some difficulties with the oxygen, which has been prescribed for her. She states that she came to the Emergency Room and when she was checked, she was told she had a hemoglobin of 5, although recheck has shown this to be 11. She does have a remote history of iron deficiency anemia; however, has also been dealing with issues related to her lung cancer more recently. She states that she has had problems with frequent and chronic nausea for at least 8-10 months. She takes multiple antinausea medications for this, which handles the symptoms well for her. She denies any abdominal pain. She does take omeprazole on a daily basis for symptoms of heartburn. She has had chronic heartburn symptoms for about 15 years for which she has been taking medications, the only one of which she states has been beneficial has been omeprazole. There is no dysphagia. She has a bowel movement once a day. She has seen no black stool, blood in the stool, or slimy stools. Her stools are formed. She has never had an EGD or colonoscopy. She states that she has been having a declining respiratory status for over a month. She follows with Dr. Grayland Ormond at the cancer center. She has difficulty with mobility at home, uses a cane and walker. This also is becoming more of an issue for her. In regards to her treatment for lung cancer, she has had both radiation treatment as  well as chemotherapy.   GASTROINTESTINAL FAMILY HISTORY: Father with history of colon cancer and colostomy. However, there is no other family history of colon or rectal cancer, liver disease, or ulcers. The patient herself has never had ulcers.   PAST MEDICAL HISTORY:   1.  Recent left lower extremity cellulitis.  2.  COPD. 3.  Stage IV metastatic lung cancer with bony metastasis.  4.  History of GERD.  5.  Depression.  6.  Chronic diastolic congestive heart failure.   ALLERGIES:  There are no known drug allergies.   SOCIAL HISTORY: The patient does not use alcohol and she has been a nonsmoker for at least 20 years.   OUTPATIENT MEDICATIONS:  Include amitriptyline 25 mg oral tablet once a day, cephalexin 1 tablet 3 times a day for 7 days, Combivent Respimat 1 puff 4 times a day, dronabinol 5 mg twice a day, fentanyl 100 mcg per hour transdermal film every 72 hours, gabapentin 300 mg 1 capsule 3 times a day, granisetron 1 mg every 12 hours p.r.n., hydromorphone 4 mg 1 tablet every 4 hours p.r.n. for pain, lidocaine topical film, metoprolol tartrate 25 mg 1 three times a day, olanzapine 2.5 mg oral tablet once a day, omeprazole 20 mg once a day, ondansetron 8 mg 1 orally every 8 hours. She takes MiraLAX once a day as needed for constipation. Promethazine 12.5 mg oral tablet, 1 every 4 hours p.r.n., scopolamine 1 patch every 3 days, senna 8.6 mg tablet  1 twice a day, Carafate 1 gram 4 times a day, oral suspension.   PHYSICAL EXAMINATION: VITAL SIGNS: Temperature is 97.7, pulse 118, respirations 19, blood pressure 110/60, pulse oximetry 94%. She is on 2 liters oxygen.  GENERAL: She is a 54 year old Caucasian female in no acute distress.  HEENT: Normocephalic, atraumatic. Eyes are anicteric. Nose, septum midline, no lesions.  Oropharynx, no lesions. NECK: No JVD.  HEART: Regular rate and rhythm without rub or gallop.  LUNGS: Show poor breath sounds throughout the mid to lower fields on the right,  somewhat better on the left, upper lung fields equal.  ABDOMEN: Soft, nontender, nondistended. Bowel sounds positive, normoactive.  RECTAL: Anorectal exam deferred. Please note, the patient had a Hemoccult test done in the Emergency Room that was negative.  EXTREMITIES: No clubbing or cyanosis.   LABORATORY DATA: Include the following: Yesterday, she had a glucose of 106, BNP of 208, BUN 5, creatinine 0.46, sodium 145, potassium 3.3, chloride 109, bicarbonate 25, calcium 8.6. Hepatic profile showing a total protein of 5.6, albumin 2.4, total bilirubin 0.4, alkaline phosphatase 65, AST 29, ALT 15, troponin I, x 1, less than 0.02. Her hemogram that is in the chart showed a white cell count of 3.7, hemoglobin and hematocrit of 9.7/30.5, platelet count of 271,000. Recheck of her hemoglobin yesterday afternoon was 11.0. Recheck of her hemoglobin early this morning was 9.0. Her BUN was 7 this morning. Her INR was 1.0 yesterday evening, 1.1 today. Her O2 saturation was 88% on room air.   IMAGING:  She had a CT angiogram of the chest to rule out PE for chest pain showing increased patchy bilateral airspace opacities, consolidation, nodular and interstitial opacities representing a combination of known tumor lymphangitic spread, pulmonary edema and/or infection. There was also a new large right pleural effusion and moderate to large left pleural effusion with bilateral lower lung atelectasis. There was no evidence of pulmonary embolism, thoracic aortic aneurysm or dissection. Unchanged sclerotic lesions in the C6 vertebral body and right 8th rib and left 5th ribs suspicious for metastatic disease.   ASSESSMENT: Initial evaluation with concern for "profound anemia," possibly reporting error.  Review of her hemoglobins show them to be stable. Per patient's report, she was Hemoccult negative on evaluation in the Emergency Room. She has shown no evidence of ongoing gastrointestinal  bleeding. She does have a history of  known anemia and follows with Dr. Grayland Ormond. She does have a history of heartburn for which she takes a daily proton pump inhibitor. She does not take nonsteroidal anti-inflammatory drugs. Currently, her respiratory difficulty in the setting of known history of lung cancer with bony metastasis and new findings of large right pleural effusion would make her high risk for any type of sedated procedure in regards to colonoscopy or EGD for anemia. It is of note that she is currently hemodynamically stable, and again, there has been no evidence of ongoing gastrointestinal bleed.   RECOMMEND:  Continuing her proton pump inhibitor, serial hemoglobin, transfuse if needed. We will discuss further with [facility] tomorrow morning.   ____________________________ Lollie Sails, MD mus:LT D: 03/07/2014 17:41:30 ET T: 03/07/2014 17:51:26 ET JOB#: [id number]  cc: Lollie Sails, MD, <Dictator> Lollie Sails MD ELECTRONICALLY SIGNED 04/13/2014 14:25

## 2014-05-02 NOTE — Consult Note (Signed)
Brief Consult Note: Diagnosis: recurrent intractable N/V.   Patient was seen by consultant.   Comments: [name] is a very pleasant 54 y/o caucasian female with stage IV adenocarcinoma of the lung with mets to bone on chemo (last dosed Dec 2015) s/p XRT with chronic pain syndrome, LA Grade B reflux esophagitis on EGD 10/27/13 by Dr Vira Agar, admitted with recurrent intractable nausea & vomiting.  Pt has had multiple admissions for this as she has "tried everything at home & IV meds & fluids are the only things that help when she gets like this".  She feels like her N/V is well-controlled for the most part on kytril BID & with an increased dose of phenergan.  ABD ultrasound benign.  She has a significant amount of breakthrough pain which seems to be the contributing factor to her nausea.  Plan: 1) Increase Protonix to '40mg'$  BID here, but would send pt home on DEXILANT '60mg'$  daily 2) Increase at home phenergan to '25mg'$  q6hrs 3) Continue PRN Kytril BID 4) She will discuss further pain management concerns with Dr Grayland Ormond & her oncology team Thanks for allowing [place] to participate in her care.  Please see full dictated note. #[id number].  Electronic Signatures: Andria Meuse (NP)  (Signed 18-Jan-16 10:52)  Authored: Brief Consult Note   Last Updated: 18-Jan-16 10:52 by Andria Meuse (NP)

## 2014-05-02 NOTE — Consult Note (Signed)
Brief Consult Note: Diagnosis: lung cancer with questionable lympangitic tumor right lung.   Patient was seen by consultant.   Consult note dictated.   Recommend to proceed with surgery or procedure.   Recommend further assessment or treatment.   Orders entered.   Discussed with Attending MD.   Comments: Reviewed with Drs. Kasa and Finnegan.  Likely tumor in right lung.  Prior VATS on right so she may need thoracotomy.  Reviewed with radiology - they agree.  Plan for surgery on Monday.  Electronic Signatures: Louis Matte (MD)  (Signed 21-Jan-16 15:20)  Authored: Brief Consult Note   Last Updated: 21-Jan-16 15:20 by Louis Matte (MD)

## 2014-05-02 NOTE — Consult Note (Signed)
Note Type Consult   (Removed):    Subjective: Chief Complaint/Diagnosis:   Stage IV adenocarcinoma of lung with widespread bony metastasis. Admitted with intractable nausea and vomiting.  HPI:   Patient last evaluated in clinic when she received her most recent dose of nivolumab on March 25, 2014.  This is now her ninth admission in 3 months. Although patient was discharged from the hospital several days ago, she developed intractable nausea and vomiting along with significant abdominal pain. She has had minimal PO intake. She does not complain of shortness of breath today. She has no neurologic complaints.  Her pain is well-controlled on her current narcotic regimen. She does not complain of fevers today.  She denies any constipation or diarrhea. Patient offers no further specific complaints today.   Review of Systems:  General: weakness  fatigue  Performance Status (ECOG): 2  Lungs: no complaints  GI: nausea/vomiting  Psych: no complaints  Pain ?: Yes  Pain- Qualitative: Moderate  Pain- Plan: Narcotic analgesic ordered  Discussed with patient: Prohylactic stimulant laxative  Stool softner  Dietary fiber  Fluids  Pain Effectiveness: Pain relief obtained  Emotional well-being: None  Review of Systems: All other systems were reviewed and found to be negative  Review of Systems:   As per HPI. Otherwise, a complete review of systems is negative.   Allergies:  No Known Allergies:   Smoking History: Smoking History Quit 1998, 20 year history.  PFSH: Additional Past Medical and Surgical History: negative.    Family history: Mother with lung cancer, father with prostate cancer.    Social history: Tobacco as above, occasional alcohol.   Home Medications: Medication Instructions Last Modified Date/Time  cephalexin 500 mg oral capsule 1 cap(s) orally every 12 hours 06-Apr-16 13:06  gabapentin 300 mg oral capsule 1 cap(s) orally 3 times a day 06-Apr-16 13:06  omeprazole 20 mg  oral delayed release capsule 1 cap(s) orally once a day 06-Apr-16 13:06  dronabinol 5 mg oral capsule 1 cap(s) orally 2 times a day 06-Apr-16 13:06  HYDROmorphone 4 mg oral tablet 1 tab(s) orally every 4 hours, As Needed for severe pain 06-Apr-16 13:06  fentaNYL 100 mcg/hr transdermal film, extended release 1 patch transdermal every 72 hours 06-Apr-16 13:06  lidocaine 5% topical film Apply 2 patch(s) topically to affected area once a day 06-Apr-16 13:06  promethazine 25 mg oral tablet 1 tab(s) orally every 6 hours, As Needed - for Nausea, Vomiting 06-Apr-16 13:06  Combivent Respimat CFC free 100 mcg-20 mcg/inh inhalation aerosol 1 puff(s) inhaled 4 times a day, As Needed - for Shortness of Breath 06-Apr-16 13:06  polyethylene glycol 3350 - oral powder for reconstitution 17 gram(s) orally once a day, As Needed - for Constipation 06-Apr-16 13:06  ondansetron 8 mg oral tablet 1 tab(s) orally every 8 hours, As Needed - for Nausea, Vomiting 06-Apr-16 13:06  scopolamine 1.5 mg transdermal film, extended release 1 patch transdermal every 72 hours 06-Apr-16 13:06  furosemide 20 mg oral tablet 1 tab(s) orally 2 times a day 06-Apr-16 13:06  Klor-Con 10 mEq oral tablet, extended release 4 tab(s) orally 2 times a day 06-Apr-16 13:06  metoprolol tartrate 25 mg oral tablet 1 tab(s) orally 3 times a day 06-Apr-16 13:06   Vital Signs:  :: vital signs stable, patient afebrile.   Physical Exam:  General: well developed, well nourished, and in no acute distress  Mental Status: normal affect  Eyes: anicteric sclera  Respiratory: Diminished breath sounds bilaterally.  Cardiovascular: regular rate and  rhythm, no murmur, rub, or gallop  Gastrointestinal: soft, nondistended, nontender, no organomegaly.  normal active bowel sounds  Musculoskeletal: No edema  Skin: No rash or petechiae noted  Neurological: alert, answering all questions appropriately.  Cranial nerves grossly intact   Laboratory Results:  Hepatic:   06-Apr-16 11:25   Bilirubin, Total 0.3 (0.3-1.2 NOTE: New Reference Range  03/09/14)  Alkaline Phosphatase 44 (38-126 NOTE: New Reference Range  03/09/14)  SGPT (ALT)  11 (14-54 NOTE: New Reference Range  03/09/14)  SGOT (AST) 19 (15-41 NOTE: New Reference Range  03/09/14)  Total Protein, Serum  5.4 (6.5-8.1 NOTE: New Reference Range  03/09/14)  Albumin, Serum  2.9 (3.5-5.0 NOTE: New reference range  03/09/14)  Routine Chem:  06-Apr-16 11:25   Magnesium, Serum  1.3 (1.7-2.4 THERAPEUTIC RANGE: 4-7 mg/dL TOXIC: > 10 mg/dL  ----------------------- NOTE: New Reference Range  03/09/14)  Glucose, Serum 94 (65-99 NOTE: New Reference Range  03/09/14)  BUN 6 (6-20 NOTE: New Reference Range  03/09/14)  Creatinine (comp) 0.66 (0.44-1.00 NOTE: New Reference Range  03/09/14)  Sodium, Serum 138 (135-145 NOTE: New Reference Range  03/09/14)  Potassium, Serum  2.1 (3.5-5.1 NOTE: New Reference Range  03/09/14)  Chloride, Serum  95 (101-111 NOTE: New Reference Range  03/09/14)  CO2, Serum 27 (22-32 NOTE: New Reference Range  03/09/14)  Calcium (Total), Serum  8.0 (8.9-10.3 NOTE: New Reference Range  03/09/14)  eGFR (African American) >60  eGFR (Non-African American) >60 (eGFR values <41m/min/1.73 m2 may be an indication of chronic kidney disease (CKD). Calculated eGFR is useful in patients with stable renal function. The eGFR calculation will not be reliable in acutely ill patients when serum creatinine is changing rapidly. It is not useful in patients on dialysis. The eGFR calculation may not be applicable to patients at the low and high extremes of body sizes, pregnant women, and vegetarians.)  Result Comment - POTASSIUM CALLED TO RICKY HAROLD  - AT 1213 04/07/14-DAS  - RESULTS VERIFIED BY REPEAT TESTING  - NOTIFIED OF CRITICAL / READ-BACK PERFORMED  Result(s) reported on 07 Apr 2014 at 12:14PM.  Anion Gap 16  Routine Hem:  06-Apr-16 11:25   WBC (CBC)  3.4  RBC  (CBC)  3.78  Hemoglobin (CBC)  11.2  Hematocrit (CBC)  34.5  Platelet Count (CBC) 270 (Result(s) reported on 07 Apr 2014 at 11:45AM.)  MCV 91  MCH 29.7  MCHC 32.5  RDW  18.1   Review Pathology Report:  Assessment and Plan: Impression:   Stage IV adenocarcinoma of the lung with bone metastasis. Intractable nausea and vomiting. Plan:   1.  Lung cancer: Previously, pathology results from open lung biopsy revealed clear lymphangitic spread of tumor. Patient received cycle 6 of 350mkg nivolumab on March 27, 2014. Her next scheduled fusion was tomorrow, but this will be delayed one week secondary to her hospital admission.. Patient last received Zometa on November 17, 2013. Will reimage in approximately May 2016.  Pain: Continue current narcotic regimen, patient does not wish to change at this time.Nausea and vomiting: Multifactorial, possibly secondary to nivolumab. Typically, side effects to this treatment are immune mediated and patient may benefit from IV steroids along with traditional anti-emetics. Also, please ensure patient is on steroids upon discharge.Electrolyte disturbance: Secondary to vomiting. Replace as needed.Disposition: Patient will likely remain in the hospital for 1-2 days. I will be out of town until Monday, April 12, 2014. Dr. PaMa Hillockill be covering. Please call with any questions.  Fax to Physician (Removed):  Advance Directive:  Advance Directive Theatre stage manager) no   Advance Directive Information Given patient refused   Electronic Signatures: Delight Hoh (MD)  (Signed 06-Apr-16 17:43)  Authored: Note Type, History of Present Illness, CC/HPI, Review of Systems, ALLERGIES, Smoking Cessation, Patient Family Social History, HOME MEDICATIONS, Vital Signs, Physical Exam, Lab Results Review, Pathology Report Review, Assessment and Plan, Fax to Physician, Advance Directive   Last Updated: 06-Apr-16 17:43 by Delight Hoh (MD)

## 2014-05-02 NOTE — Discharge Summary (Signed)
PATIENT NAME:  [name], [name] MR#:  [id number] DATE OF BIRTH:  Oct 11, 1960  DATE OF ADMISSION:  04/01/2014 DATE OF DISCHARGE:  04/04/2014  PRESENTING COMPLAINT:  Nausea, vomiting, epigastric abdominal pain.   PRIMARY ONCOLOGIST:  Dr. Grayland Ormond.   DISCHARGE DIAGNOSES:  1.  Left lower extremity swelling with mild cellulitis, improved.  2.  History of lung cancer.  3.  Nausea and vomiting, resolved.   CODE STATUS: Full code.   MEDICATIONS: 1.  Gabapentin 300 mg 1 capsule 3 times a day.  2.  Omeprazole 20 mg daily.  3.  Dronabinol 5 mg p.o. b.i.d.  4.  Hydromorphone 4 mg 1 tablet every 4 hours as needed.  5.  Fentanyl 100 mcg patch every 72 hours.  6.  Lidocaine topical film, apply 2 patches to effected area once a day.  7.  Promethazine 25 mg 1 tablet every 6 hours as needed. 8.  Combivent Respimat 1 puff 4 times a day as needed.  9.  Polyethylene glycol 17 grams orally p.o. daily as needed. 10.  Lasix 20 mg p.o. b.i.d.  11.  Klor-Con 20 mEq 2 tablets b.i.d.  12.  Metoprolol 25 mg 1 1/2 tablet b.i.d.  13.  Zofran 8 mg 1 tablet every 8 hours as needed.  14.  Scopolamine patch 1.5 mg every 72 hours.  15.  Keflex 500 mg b.i.d.  16.  Fluoxetine 20 mg p.o. daily.  17.  Nasal cannula oxygen.   FOLLOWUP: With Dr. Grayland Ormond on your scheduled appointment.   BRIEF SUMMARY OF HOSPITAL COURSE:  [name] is a 54 year old Caucasian female well known to our service from previous many admissions who came in with nausea, vomiting, epigastric pain.  She was admitted with:  1.  Acute epigastric abdominal pain with nausea, vomiting which appears it could be related to chemo, improved. Received IV fluids and was tolerating a p.o. diet prior to discharge.  2.  Left lower extremity erythema, mild cellulitis, resolved.  Doppler negative for DVT. She will finish up a course with Keflex.  3.  Malignant lung cancer with lymphangitic spread.  The patient follows up with Dr. Grayland Ormond for her outpatient chemo.   4.  History of depression.   Overall hospital stay otherwise remained stable. Patient remained a full code.   TIME SPENT: 40 minutes.   ____________________________ Hart Rochester Posey Pronto, MD sap:sp D: 04/06/2014 11:51:45 ET T: 04/06/2014 12:28:00 ET JOB#: [id number]  cc: Armonii Sieh A. Posey Pronto, MD, <Dictator> Kathlene November. Grayland Ormond, MD Ilda Basset MD ELECTRONICALLY SIGNED 04/06/2014 16:00

## 2014-05-02 NOTE — Consult Note (Signed)
Brief Consult Note: Patient was seen by consultant.   Consult note dictated.   Recommend further assessment or treatment.   Discussed with Attending MD.   Comments: Very small PTX, postop rt VATS. pt to be admitted for weakness. Will follow and repeat CXR.  Electronic Signatures: Florene Glen (MD)  (Signed 31-Jan-16 11:31)  Authored: Brief Consult Note   Last Updated: 31-Jan-16 11:31 by Florene Glen (MD)

## 2014-05-02 NOTE — H&P (Signed)
PATIENT NAME:  [name], [name] MR#:  [id number] DATE OF BIRTH:  1960/05/27  DATE OF ADMISSION:  03/03/2014  REFERRING PHYSICIAN: Gretchen Short. Beather Arbour, M.D.   PRIMARY CARE PHYSICIAN: Nonlocal.   ADMISSION DIAGNOSIS: Cellulitis and tachycardia.   HISTORY OF PRESENT ILLNESS: This is a 54 year old, Caucasian female, who presents to the Emergency Department complaining of pain in her right leg. The patient admits that the pain is specifically in her left ankle and foot and the pain was in her left leg. She denies fevers. She admits to sharp brief and stabbing pain over her right breast and shoulder 2 times tonight. She denies any associated shortness of breath with the pain, but she has been short of breath in general over the last day, or so. In the Emergency Department, the patient underwent a Doppler ultrasound of the left lower extremity to rule out DVT. Due to persistent tachycardia, as well as developing cellulitis of the left lower extremity, the Emergency Department called for admission.   REVIEW OF SYSTEMS:    CONSTITUTIONAL: The patient denies fever, but admits to generalized weakness secondary to chemotherapy.  EYES: Denies blurred vision or inflammation.  ENT: Denies sore throat or tinnitus.  RESPIRATORY: Admits to some shortness of breath, but denies cough.  CARDIOVASCULAR: Denies chest pain, palpitations, orthopnea, or paroxysmal nocturnal dyspnea.  GASTROINTESTINAL: Denies nausea, vomiting, diarrhea, or abdominal pain.  GENITOURINARY: Denies dysuria, increased frequency, or hesitancy of urination.  ENDOCRINE: Denies polyuria or polydipsia.  HEMATOLOGIC AND LYMPHATIC: Denies easy bruising or bleeding.  MUSCULOSKELETAL: Denies arthralgias or myalgias.  SKIN: Denies rashes or lesions.  NEUROLOGIC: Denies numbness in her extremities or dysarthria.  PSYCHIATRIC: Denies depression or suicidal ideation.   PAST MEDICAL HISTORY: Adenocarcinoma of the lung diagnosed 14 months ago, and depression.    PAST SURGICAL HISTORY: Pericardiocentesis and biopsy of the right lung x 2.   SOCIAL HISTORY: The patient quit smoking 20 years ago. She does not smoke, drink, or do drugs. She lives alone.   FAMILY HISTORY: Hypertension, diabetes. Her mother is breast and lung cancer survivor. Her father is a pancreatic cancer survivor.   MEDICATIONS:  1. Amitriptyline 25 mg 1 tablet p.o. at bedtime.  2. Combivent Respimat 100 mcg/20 mcg inhalational aerosol 1 puff inhaled 4 times a day as needed for shortness of breath.  3. Dronabinol 5 mg 1 capsule p.o. b.i.d.  4. Fentanyl 100 mcg per hour transdermal extended release film 1 patch transdermally every 72 hours.  5. Gabapentin 300 mg 1 capsule p.o. t.i.d.  6. Granisetron 1 mg 1 tablet p.o. every 12 hours.  7. Hydromorphone 4 mg 1 tablet p.o. every 4 hours as needed for severe pain.  8. Lidocaine 5% apply topically to affected area once a day.  9. Olanzapine 2.5 mg 1 tablet p.o. daily.  10. Omeprazole 20 mg 1 capsule p.o. daily.  11. Ondansetron 8 mg 1 tablet p.o. every 8 hours as needed.  12. Polyethylene glycol 17-gram reconstitution daily as needed for constipation.  13. Promethazine 12.5 mg 1 tablet p.o. every 4 hours.  14. Scopolamine apply topically every 3 days.  15. Senna 8.6 mg 1 tablet p.o. b.i.d.  16. Sucralfate 1 gram per 10 mL suspension 10 mL p.o. 4 times a day.    ALLERGIES: No known drug allergies.   PERTINENT LABORATORY RESULTS AND RADIOGRAPHIC FINDINGS: Serum glucose is 92, BUN 7, creatinine 0.34, serum sodium 140, potassium is 3.7, chloride 105, bicarbonate 25, calcium is 8. Troponin is negative. White blood  cell count is 5.1, hemoglobin is 9.9, hematocrit 30.1, platelet count 216,000. MCV is 95.   Urinalysis is negative for infection.   Chest x-ray shows vascular congestion with bilateral central airspace opacities concerning for mild interstitial edema, more prominent on the right.   Ultrasound of the left lower extremity is  negative for deep venous thrombosis.   PHYSICAL EXAMINATION:  VITAL SIGNS: Temperature is 98.2, pulse 126, respirations 18, blood pressure 105/64, pulse oximetry is 94% on 2 liters of oxygen via nasal cannula.  GENERAL: The patient is alert and oriented x 3, in no apparent distress.  HEENT: Normocephalic, atraumatic. Pupils equal, round, and reactive to light and accommodation. Extraocular movements are intact. Mucous membranes are tacky.  NECK: Trachea is midline. No adenopathy. Thyroid nonpalpable, nontender.  CHEST: Symmetric and atraumatic. There are healed scars on the patient's right posterior chest from her lung biopsies. There is also some patterned scarring in the middle of her back in a circular formation that the patient states is likely scars from shingles infection after starting chemotherapy. It is nonpainful.  CARDIOVASCULAR: Tachycardic rate, but normal rhythm. Normal S1 and S2. No rubs, clicks, or murmurs appreciated.  LUNGS: Clear to auscultation bilaterally. Normal effort and excursion.  ABDOMEN: Positive bowel sounds. Soft, nontender, nondistended. No hepatosplenomegaly.  GENITOURINARY: Deferred.  MUSCULOSKELETAL: The patient moves all 4 extremities equally. I have not tested her gait.  SKIN: Warm and dry. There are no rashes or lesions. The skin on the dorsum of the left foot, as well the lateral side of the ankle, is very warm to touch. There is no erythema or discernible lesions; however, there are some very faint abrasions to the lateral aspect of her left foot.  EXTREMITIES: No clubbing or cyanosis. There is trace edema of her lower extremities.  NEUROLOGIC: Cranial nerves II through XII are grossly normal.  PSYCHIATRIC: Mood is normal. Affect is congruent. The patient has excellent insight and judgment into her medical condition.   ASSESSMENT AND PLAN: This is a 54 year old female admitted for cellulitis and tachycardia.   1. Cellulitis. It is early cellulitis, if that.  Blood cultures have been obtained. The patient is hemodynamically stable, at this time. She has been given vancomycin in the Emergency Department and we will continue antibiotics.  2. Tachycardia. This is a recurrent problem. This is likely related to thoracic cancer pain and mild dehydration. The patient was given 1 bolus of normal saline in the Emergency Department. I will give her another bolus before starting maintenance fluid. I will also check a thyroid stimulating hormone.  3. Sepsis. The patient meets criteria via tachycardia and tachypnea.  4. Adenocarcinoma of the lung. The patient is due for another chemotherapy treatment in 2 days. We will place oncology consult to help coordinate this.  5. Depression. Continue olanzapine.  6. Deep vein thrombosis prophylaxis, heparin.  7. Gastrointestinal prophylaxis. We will continue the patient's proton pump inhibitor per her home regimen.   CODE STATUS: The patient is a FULL CODE.   TIME SPENT ON ADMISSION ORDERS AND PATIENT CARE: Approximately 35 minutes.    ____________________________ Norva Riffle. Marcille Blanco, MD msd:JT D: 03/03/2014 08:58:02 ET T: 03/03/2014 09:09:36 ET JOB#: [id number]  cc: Norva Riffle. Marcille Blanco, MD, <Dictator> Norva Riffle DIAMOND MD ELECTRONICALLY SIGNED 03/11/2014 8:02

## 2014-05-02 NOTE — Discharge Summary (Signed)
PATIENT NAME:  [name], [name] MR#:  [id number] DATE OF BIRTH:  1960-06-03  DATE OF ADMISSION:  01/31/2014 DATE OF DISCHARGE:  02/02/2014  DISCHARGE DIAGNOSES: 1.  Generalized weakness.  2.  Chronic pain syndrome.  3.  Stage IV lung cancer with metastases with worsening.  4.  Chronic nausea.  5.  Constipation.  6.  Gastroesophageal reflux disease.  7.  Neuropathy.   DISCHARGE MEDICATIONS:  1.  Granisetron 1 mg oral every 12 hours.  2.  Lidocaine 5% topical film once a day.  3.  Senna 8.6 mg oral 2 times a day.  4.  Sucralfate 10 mL oral 4 times a day.  5.  Prochlorperazine 10 mg oral every 6 hours as needed for nausea or vomiting.  6.  Dronabinol 5 mg oral 2 times a day.  7.  Fentanyl 75 mcg/hour transdermal every 72 hours.  8.  Amitriptyline 25 mg oral once a day.  9.  Scopolamine apply topically to effected area every 3 days.  10.  Voriconazole 200 mg oral every 12 hours.  11.  Olanzapine 2.5 mg oral once a day.  12.  Hydromorphone 4 mg every 4 hours as needed for severe pain.  13.  Phenergan 25 mg oral every 4 hours.  14.  Omeprazole 20 mg oral once a day.  15.  Gabapentin 300 mg oral 3 times a day.   DISCHARGE INSTRUCTIONS: Home health with physical therapy has been set up. Follow up with Dr. Ola Spurr in 1 week, Dr. Grayland Ormond within a week and Dr. Genevive Bi within a week. The patient will need a follow-up chest x-ray in Dr. Genevive Bi office for follow-up of the small pneumothorax from her right chest opening and biopsy.   IMAGING STUDIES: Include a MRI of the brain which showed no metastasis or strokes.   Chest x-ray, PA and lateral, showed postsurgical changes of the right upper lobe with slight improvement of the tiny right apical pneumothorax. Persistent right chest wall subcutaneous emphysema. Stable density of left anterior fifth rib from metastasis.  Power Port catheter in stable position.  ADMITTING HISTORY AND PHYSICAL AND HOSPITAL COURSE: Please see detailed H and P dictated by  Dr. Tressia Miners. In brief, a 54 year old female patient with history of worsening stage IV lung cancer with progressively worsening shortness of breath and weakness presented to the hospital complaining of weakness and shortness of breath. The patient was admitted after she was found to have possible pneumonia and tiny right apical pneumothorax. The patient was seen by Dr. Genevive Bi who suggested that the pneumothorax was small and seems to be improving and wanted to see her in his office with a follow-up chest x-ray as outpatient. No acute intervention was needed. The patient did not have any pneumonia. She had no fever, shortness of breath was the same, unfortunately worsening with her worsening cancer.   The patient was seen by palliative care and Dr. Grayland Ormond of oncology. Unfortunately, the patient does not seem to believe that her cancer is worsening at this point, although this is the case. On extensive discussion, the patient wanted to stay another day and be monitored in the hospital. She was seen by Dr. Grayland Ormond who did get an inpatient MRI, which was normal.   Prior to discharge, the patient's lungs sound clear, S1 and S2 heard, no edema and the patient is being discharged home in a stable condition.   The patient was also seen by palliative care who are directing her pain medications at this point.  TIME SPENT ON DAY OF DISCHARGE IN DISCHARGE ACTIVITY: 42 minutes. ____________________________ Leia Alf Harjit Leider, MD srs:sb D: 02/02/2014 14:29:25 ET T: 02/02/2014 14:41:50 ET JOB#: [id number]  cc: Alveta Heimlich R. Jefferie Holston, MD, <Dictator> Neita Carp MD ELECTRONICALLY SIGNED 02/08/2014 3:54

## 2014-05-02 NOTE — Consult Note (Signed)
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 Admitted with increased diarrhea. HPI:   Patient last received her most recent dose of nivolumab on March 25, 2014.  This is now her tenth admission in 3 months. Although patient was discharged from the hospital several days ago, she developed increased diarrhea. She does not complain of nausea and vomiting. She continues to have had minimal PO intake. She does not complain of shortness of breath today. She has no neurologic complaints.  Her pain is well-controlled on her current narcotic regimen. She does not complain of fevers today. Patient offers no further specific complaints today.   Review of Systems:  General: weakness  fatigue  Performance Status (ECOG): 2  Lungs: no complaints  GI: diarrhea  Psych: no complaints  Pain ?: Yes  Pain- Qualitative: Moderate  Pain- Plan: Narcotic analgesic ordered  Discussed with patient: Prohylactic stimulant laxative  Stool softner  Dietary fiber  Fluids  Pain Effectiveness: Pain relief obtained  Emotional well-being: None  Review of Systems: All other systems were reviewed and found to be negative  Review of Systems:   As per HPI. Otherwise, a complete review of systems is negative.   Allergies:  No Known Allergies:   Smoking History: Smoking History Quit 1998, 20 year history.  PFSH: Additional Past Medical and Surgical History: negative.    Family history: Mother with lung cancer, father with prostate cancer.    Social history: Tobacco as above, occasional alcohol.   Home Medications: Medication Instructions Last Modified Date/Time  DexPak 6 DayTaperpak 1.5 mg oral tablet 1 tab(s) orally once. 10-Apr-16 12:02  gabapentin 300 mg oral capsule 1 cap(s) orally 3 times a day 10-Apr-16 12:02  omeprazole 20 mg oral delayed release capsule 1 cap(s) orally once a day 10-Apr-16 12:02  dronabinol 5 mg oral capsule  1 cap(s) orally 2 times a day 10-Apr-16 12:02  HYDROmorphone 4 mg oral tablet 1 tab(s) orally every 4 hours, As Needed for severe pain 10-Apr-16 12:02  fentaNYL 100 mcg/hr transdermal film, extended release 1 patch transdermal every 72 hours 10-Apr-16 12:02  lidocaine 5% topical film Apply 2 patch(s) topically to affected area once a day 10-Apr-16 12:02  promethazine 25 mg oral tablet 1 tab(s) orally every 6 hours, As Needed - for Nausea, Vomiting 10-Apr-16 12:02  Combivent Respimat CFC free 100 mcg-20 mcg/inh inhalation aerosol 1 puff(s) inhaled 4 times a day, As Needed - for Shortness of Breath 10-Apr-16 12:02  polyethylene glycol 3350 - oral powder for reconstitution 17 gram(s) orally once a day, As Needed - for Constipation 10-Apr-16 12:02  ondansetron 8 mg oral tablet 1 tab(s) orally every 8 hours, As Needed - for Nausea, Vomiting 10-Apr-16 12:02  scopolamine 1.5 mg transdermal film, extended release 1 patch transdermal every 72 hours 10-Apr-16 12:02  furosemide 20 mg oral tablet 1 tab(s) orally 2 times a day 10-Apr-16 12:02  Klor-Con 10 mEq oral tablet, extended release 4 tab(s) orally 2 times a day 10-Apr-16 12:02  metoprolol tartrate 25 mg oral tablet 1 tab(s) orally 3 times a day 10-Apr-16 12:02   Vital Signs:  :: vital signs stable, patient afebrile.   Physical Exam:  General: well developed, well nourished, and in no acute distress  Mental Status: normal affect  Eyes: anicteric sclera  Respiratory: Diminished breath sounds bilaterally.  Cardiovascular: regular rate and rhythm, no murmur, rub, or gallop  Gastrointestinal: soft, nondistended, nontender, no organomegaly.  normal active  bowel sounds  Musculoskeletal: No edema  Skin: No rash or petechiae noted  Neurological: alert, answering all questions appropriately.  Cranial nerves grossly intact   Laboratory Results: Hepatic:  12-Apr-16 04:57   Bilirubin, Total 0.4 (0.3-1.2 NOTE: New Reference Range  03/09/14)  Alkaline  Phosphatase 39 (38-126 NOTE: New Reference Range  03/09/14)  SGPT (ALT) 26 (14-54 NOTE: New Reference Range  03/09/14)  SGOT (AST) 24 (15-41 NOTE: New Reference Range  03/09/14)  Total Protein, Serum  4.7 (6.5-8.1 NOTE: New Reference Range  03/09/14)  Albumin, Serum  2.8 (3.5-5.0 NOTE: New reference range  03/09/14)  Routine Chem:  12-Apr-16 04:57   Glucose, Serum 86 (65-99 NOTE: New Reference Range  03/09/14)  BUN 8 (6-20 NOTE: New Reference Range  03/09/14)  Creatinine (comp) 0.67 (0.44-1.00 NOTE: New Reference Range  03/09/14)  Sodium, Serum 138 (135-145 NOTE: New Reference Range  03/09/14)  Potassium, Serum 3.5 (3.5-5.1 NOTE: New Reference Range  03/09/14)  Chloride, Serum 108 (101-111 NOTE: New Reference Range  03/09/14)  CO2, Serum 26 (22-32 NOTE: New Reference Range  03/09/14)  Calcium (Total), Serum  7.4 (8.9-10.3 NOTE: New Reference Range  03/09/14)  eGFR (African American) >60  eGFR (Non-African American) >60 (eGFR values <64m/min/1.73 m2 may be an indication of chronic kidney disease (CKD). Calculated eGFR is useful in patients with stable renal function. The eGFR calculation will not be reliable in acutely ill patients when serum creatinine is changing rapidly. It is not useful in patients on dialysis. The eGFR calculation may not be applicable to patients at the low and high extremes of body sizes, pregnant women, and vegetarians.)  Result Comment LABS - This specimen was collected through an   - indwelling catheter or arterial line.  - A minimum of 544m of blood was wasted prior    - to collecting the sample.  Interpret  - results with caution.  Result(s) reported on 13 Apr 2014 at 05:37AM.  Anion Gap  4  Magnesium, Serum  1.3 (1.7-2.4 THERAPEUTIC RANGE: 4-7 mg/dL TOXIC: > 10 mg/dL  ----------------------- NOTE: New Reference Range  03/09/14)   Medical Imaging Results:   Review Medical Imaging   Abdomen and Pelvis With Contrast  12-Apr-2014 18:30:00: IMPRESSION:  Moderate right and small left pleural effusions. There are bilateral  ground-glass and consolidative opacities within the lower lobes as  well as interlobular septal thickening. Findings are nonspecific  however may be secondary to pulmonary edema. Infection could have a  similar appearance. Recommend continued radiographic followup to  ensure resolution and exclude the possibility of neoplasm/metastatic  disease.    Interval development of a nonspecific low-attenuation lesion in the  right hepatic lobe. Metastatic disease not excluded. Consider  correlation with hepatic MRI in the nonacute setting.    Nonspecific linear high attenuation within the right adnexal  location may potentially be secondary to a peripherally enhancing  ovarian follicle. Given the indeterminate CT appearance, recommend  further evaluation with pelvic ultrasound.    Small amount of free fluid in the pelvis.  Electronically Signed    By: DrLovey Newcomer.D.    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 Diarrhea. Plan:   1.  Lung cancer: Previously, pathology results from open lung biopsy revealed clear lymphangitic spread of tumor. Patient received cycle 6 of 51m61mg nivolumab on March 27, 2014. Her  next scheduled infusion will be delayed until her acute symptoms resolve. Patient last received Zometa on November 17, 2013. Will reimage in approximately May 2016.  Pain: Continue current narcotic regimen, patient does not wish to change at this time.Nausea, vomiting, diarrhea: Appreciate GI input. Possibly secondary to nivolumab, but would be unusual given the timing of her symptoms. Typically, side effects to this treatment are immune mediated and patient may benefit from IV steroids along with traditional anti-emetics.Electrolyte disturbance: Secondary to diarrhea. Replace as needed.Disposition:  Patient will likely remain in the hospital for 1-2 days.  consult, will follow.  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

## 2014-05-02 NOTE — Consult Note (Signed)
Pt seen and examined. Full consult to follow. Recurrent admission for N/V/D and abdominal pain. Pt with metastatic lung ca. Last chemo 3 weeks ago. Known hx of ERGD. On daily prilosec. Would increase to bid. Agree with stool studies, incl c.diff. If positive, treat. If neg, then can try imodium/lomotil prn for diarrhea. If symptomsa persist, consider either EGD or colon later. Will follow. Thanks.  Electronic Signatures: Verdie Shire (MD)  (Signed on 10-Apr-16 18:41)  Authored  Last Updated: 10-Apr-16 18:41 by Verdie Shire (MD)

## 2014-05-02 NOTE — Consult Note (Signed)
Chief Complaint:  Subjective/Chief Complaint Pt notes continues nausea.  No emesis.  c/o persistent upper abdominal pain.   VITAL SIGNS/ANCILLARY NOTES: **Vital Signs.:   19-Jan-16 05:10  Vital Signs Type Routine  Temperature Temperature (F) 97.6  Celsius 36.4  Temperature Source oral  Pulse Pulse 84  Respirations Respirations 20  Systolic BP Systolic BP 891  Diastolic BP (mmHg) Diastolic BP (mmHg) 69  Mean BP 81  Pulse Ox % Pulse Ox % 94  Pulse Ox Activity Level  At rest  Oxygen Delivery 2L   Brief Assessment:  GEN well developed, well nourished, no acute distress, A/Ox3   Cardiac Regular   Respiratory normal resp effort   Gastrointestinal details normal Soft  Nontender  Nondistended  No masses palpable  No rebound tenderness  No gaurding   EXTR negative cyanosis/clubbing, negative edema   Additional Physical Exam Skin: warm, dry   Assessment/Plan:  Assessment/Plan:  Assessment Chronic GERD:  Stable.  LA Grade B reflux esophagitis on EGD 10/27/13 by Dr Vira Agar Intractable nausea & vomiting: Pt stable, reviewed ultrasound findings with patient.  Patient voiced frustration with "no one finding the problem, no one listening"  Reviewed previous EGD with Patient.  Multiple questions answered.   Plan No new recommendations Will sign off Please call if you have any questions or concerns or patient's condition changes.   Electronic Signatures: Andria Meuse (NP)  (Signed 19-Jan-16 13:22)  Authored: Chief Complaint, VITAL SIGNS/ANCILLARY NOTES, Brief Assessment, Assessment/Plan   Last Updated: 19-Jan-16 13:22 by Andria Meuse (NP)

## 2014-05-05 ENCOUNTER — Other Ambulatory Visit: Payer: Self-pay

## 2014-05-06 ENCOUNTER — Other Ambulatory Visit: Payer: Self-pay

## 2014-05-06 ENCOUNTER — Inpatient Hospital Stay: Payer: Medicaid Other | Attending: Oncology | Admitting: Oncology

## 2014-05-06 ENCOUNTER — Inpatient Hospital Stay: Payer: Medicaid Other

## 2014-05-06 VITALS — BP 112/67 | HR 97 | Temp 98.8°F | Resp 18 | Wt 158.1 lb

## 2014-05-06 DIAGNOSIS — I959 Hypotension, unspecified: Secondary | ICD-10-CM | POA: Insufficient documentation

## 2014-05-06 DIAGNOSIS — C3490 Malignant neoplasm of unspecified part of unspecified bronchus or lung: Secondary | ICD-10-CM | POA: Diagnosis not present

## 2014-05-06 DIAGNOSIS — M7989 Other specified soft tissue disorders: Secondary | ICD-10-CM | POA: Diagnosis not present

## 2014-05-06 DIAGNOSIS — Z79899 Other long term (current) drug therapy: Secondary | ICD-10-CM | POA: Insufficient documentation

## 2014-05-06 DIAGNOSIS — R630 Anorexia: Secondary | ICD-10-CM | POA: Diagnosis not present

## 2014-05-06 DIAGNOSIS — E860 Dehydration: Secondary | ICD-10-CM | POA: Insufficient documentation

## 2014-05-06 DIAGNOSIS — R609 Edema, unspecified: Secondary | ICD-10-CM | POA: Insufficient documentation

## 2014-05-06 DIAGNOSIS — G893 Neoplasm related pain (acute) (chronic): Secondary | ICD-10-CM | POA: Diagnosis not present

## 2014-05-06 DIAGNOSIS — Z87891 Personal history of nicotine dependence: Secondary | ICD-10-CM | POA: Diagnosis not present

## 2014-05-06 DIAGNOSIS — Z801 Family history of malignant neoplasm of trachea, bronchus and lung: Secondary | ICD-10-CM

## 2014-05-06 DIAGNOSIS — Z5111 Encounter for antineoplastic chemotherapy: Secondary | ICD-10-CM | POA: Diagnosis present

## 2014-05-06 DIAGNOSIS — C3491 Malignant neoplasm of unspecified part of right bronchus or lung: Secondary | ICD-10-CM

## 2014-05-06 DIAGNOSIS — C7951 Secondary malignant neoplasm of bone: Secondary | ICD-10-CM | POA: Diagnosis not present

## 2014-05-06 LAB — COMPREHENSIVE METABOLIC PANEL
ALK PHOS: 76 U/L (ref 38–126)
ALT: 38 U/L (ref 14–54)
ANION GAP: 9 (ref 5–15)
AST: 31 U/L (ref 15–41)
Albumin: 3.5 g/dL (ref 3.5–5.0)
BUN: 34 mg/dL — AB (ref 6–20)
CO2: 33 mmol/L — AB (ref 22–32)
Calcium: 8.2 mg/dL — ABNORMAL LOW (ref 8.9–10.3)
Chloride: 93 mmol/L — ABNORMAL LOW (ref 101–111)
Creatinine, Ser: 0.96 mg/dL (ref 0.44–1.00)
GFR calc Af Amer: >60 mL/min (ref >60)
GFR calc non Af Amer: >60 mL/min (ref >60)
GLUCOSE: 206 mg/dL — AB (ref 65–99)
POTASSIUM: 3.7 mmol/L (ref 3.5–5.1)
SODIUM: 135 mmol/L (ref 135–145)
Total Bilirubin: 0.5 mg/dL (ref 0.3–1.2)
Total Protein: 6.1 g/dL — ABNORMAL LOW (ref 6.5–8.1)

## 2014-05-06 LAB — CBC WITH DIFFERENTIAL/PLATELET
Basophils Absolute: 0 10*3/uL (ref 0–0.1)
Basophils Relative: 0 %
Eosinophils Absolute: 0 10*3/uL (ref 0–0.7)
Eosinophils Relative: 0 %
HCT: 36.3 % (ref 35.0–47.0)
HEMOGLOBIN: 11.7 g/dL — AB (ref 12.0–16.0)
LYMPHS ABS: 0.5 10*3/uL — AB (ref 1.0–3.6)
Lymphocytes Relative: 4 %
MCH: 28.2 pg (ref 26.0–34.0)
MCHC: 32.1 g/dL (ref 32.0–36.0)
MCV: 87.8 fL (ref 80.0–100.0)
MONOS PCT: 3 %
Monocytes Absolute: 0.3 10*3/uL (ref 0.2–0.9)
NEUTROS PCT: 93 %
Neutro Abs: 11.2 10*3/uL — ABNORMAL HIGH (ref 1.4–6.5)
Platelets: 236 10*3/uL (ref 150–440)
RBC: 4.13 MIL/uL (ref 3.80–5.20)
RDW: 17.9 % — AB (ref 11.5–14.5)
WBC: 12.1 10*3/uL — ABNORMAL HIGH (ref 3.6–11.0)

## 2014-05-06 MED ORDER — SODIUM CHLORIDE 0.9 % IV SOLN
Freq: Once | INTRAVENOUS | Status: AC
  Administered 2014-05-06: 16:00:00 via INTRAVENOUS
  Filled 2014-05-06: qty 250

## 2014-05-06 MED ORDER — FENTANYL 25 MCG/HR TD PT72: 25.0000 ug | MEDICATED_PATCH | TRANSDERMAL | Status: DC

## 2014-05-06 MED ORDER — PROMETHAZINE HCL 25 MG/ML IJ SOLN
25.0000 mg | Freq: Once | INTRAMUSCULAR | Status: AC
  Administered 2014-05-06: 25 mg via INTRAVENOUS
  Filled 2014-05-06: qty 1

## 2014-05-06 MED ORDER — FUROSEMIDE 40 MG PO TABS: 40.0000 mg | ORAL_TABLET | Freq: Two times a day (BID) | ORAL | Status: DC

## 2014-05-06 MED ORDER — SODIUM CHLORIDE 0.9 % IV SOLN
200.0000 mg | Freq: Once | INTRAVENOUS | Status: AC
  Administered 2014-05-06: 200 mg via INTRAVENOUS
  Filled 2014-05-06: qty 20

## 2014-05-06 MED ORDER — SODIUM CHLORIDE 0.9 % IJ SOLN
10.0000 mL | INTRAMUSCULAR | Status: DC | PRN
Start: 2014-05-06 — End: 2014-11-11
  Administered 2014-05-06: 10 mL
  Filled 2014-05-06: qty 10

## 2014-05-06 MED ORDER — HEPARIN SOD (PORK) LOCK FLUSH 100 UNIT/ML IV SOLN
500.0000 [IU] | Freq: Once | INTRAVENOUS | Status: AC | PRN
  Administered 2014-05-06: 500 [IU]
  Filled 2014-05-06: qty 5

## 2014-05-06 MED ORDER — METOPROLOL SUCCINATE ER 25 MG PO TB24: 37.5000 mg | ORAL_TABLET | Freq: Three times a day (TID) | ORAL | Status: DC

## 2014-05-06 NOTE — Progress Notes (Signed)
Patient has constant nausea that is maintained with Phenergan and Zofran

## 2014-05-07 ENCOUNTER — Telehealth: Payer: Self-pay | Admitting: *Deleted

## 2014-05-07 MED ORDER — METOPROLOL TARTRATE 25 MG PO TABS: 25.0000 mg | ORAL_TABLET | Freq: Three times a day (TID) | ORAL | Status: DC

## 2014-05-07 NOTE — Telephone Encounter (Signed)
Has been on Metoprol Tartrate and new rx is for metoprolol succinate ER, Is this correct? I spoke with Dr Grayland Ormond who said she needs what ever she has been on Resubmitted rx for metoprolol Tartrate 25 mg tid Pharmacy informed and new order to d/c succinate and fill tatrate which was resubmitted

## 2014-05-10 NOTE — Progress Notes (Signed)
[facility]  [phone] [phone]  ID: [name] OB: 24-Jun-1960  MR#: [id number]  IDP#:[id number]  No care team member to display  CHIEF COMPLAINT:  Chief Complaint  Patient presents with  . Follow-up     lung cancer  . Chemotherapy    INTERVAL HISTORY: Patient returns to clinic today for further evaluation and consideration of her next infusion of nivolumab.  She continues to claim of bilateral peripheral edema, which is essentially unchanged. She otherwise feels well and is asymptomatic. She does not complain of shortness of breath today. She has no neurologic complaints.  Her pain is well-controlled on her current narcotic regimen. She does not complain of fevers today.  She denies any nausea, vomiting, constipation, or diarrhea. Patient offers no further specific complaints today.   REVIEW OF SYSTEMS:   Review of Systems  Constitutional: Negative for fever and weight loss.  Respiratory: Negative for shortness of breath.   Cardiovascular: Positive for leg swelling.  Gastrointestinal: Negative for nausea.  Neurological: Negative for weakness.    As per HPI. Otherwise, a complete review of systems is negatve.  PAST MEDICAL HISTORY: Past Medical History  Diagnosis Date  . Pneumonia   . Lung cancer     PAST SURGICAL HISTORY: Past Surgical History  Procedure Laterality Date  . Video bronchoscopy Bilateral 01/14/2013    Procedure: VIDEO BRONCHOSCOPY WITHOUT FLUORO;  Surgeon: Wilhelmina Mcardle, MD;  Location: [facility] ENDOSCOPY;  Service: Cardiopulmonary;  Laterality: Bilateral;  . Video bronchoscopy N/A 01/27/2013    Procedure: VIDEO BRONCHOSCOPY;  Surgeon: Ivin Poot, MD;  Location: [place];  Service: Thoracic;  Laterality: N/A;  . Video assisted thoracoscopy Right 01/27/2013    Procedure: VIDEO ASSISTED THORACOSCOPY;  Surgeon: Ivin Poot, MD;  Location: [place];  Service: Thoracic;  Laterality: Right;  . Lung biopsy Right  01/27/2013    Procedure: LUNG BIOPSY;  Surgeon: Ivin Poot, MD;  Location: [place];  Service: Thoracic;  Laterality: Right;  . Pericardial tap N/A 01/11/2013    Procedure: PERICARDIAL TAP;  Surgeon: Blane Ohara, MD;  Location: [facility] CATH LAB;  Service: Cardiovascular;  Laterality: N/A;    FAMILY HISTORY Family History  Problem Relation Age of Onset  . Coronary artery disease Mother 1  . Lung cancer Mother   . Prostate cancer Father        ADVANCED DIRECTIVES:    HEALTH MAINTENANCE: History  Substance Use Topics  . Smoking status: Former Research scientist (life sciences)  . Smokeless tobacco: Never Used  . Alcohol Use: Yes     Comment: occasionally has a drink     Colonoscopy:  PAP:  Bone density:  Lipid panel:  Allergies  Allergen Reactions  . No Known Allergies     Current Outpatient Prescriptions  Medication Sig Dispense Refill  . dronabinol (MARINOL) 5 MG capsule Take 5 mg by mouth 2 (two) times daily before a meal.    . fentaNYL (DURAGESIC - DOSED MCG/HR) 100 MCG/HR Place 100 mcg onto the skin every 3 (three) days.    . fentaNYL (DURAGESIC - DOSED MCG/HR) 25 MCG/HR patch Place 1 patch (25 mcg total) onto the skin every 3 (three) days. 10 patch 0  . furosemide (LASIX) 40 MG tablet Take 1 tablet (40 mg total) by mouth 2 (two) times daily. 60 tablet 1  . gabapentin (NEURONTIN) 300 MG capsule Take 300 mg by mouth 3 (three) times daily.    Marland Kitchen HYDROmorphone (DILAUDID) 4 MG tablet Take by  mouth every 4 (four) hours as needed for severe pain.    Marland Kitchen lidocaine (LIDODERM) 5 % Place 1 patch onto the skin daily. Remove & Discard patch within 12 hours or as directed by MD    . omeprazole (PRILOSEC) 20 MG capsule Take 20 mg by mouth daily.    . ondansetron (ZOFRAN) 8 MG tablet Take 8 mg by mouth every 8 (eight) hours as needed for nausea or vomiting.    . polyethylene glycol (MIRALAX / GLYCOLAX) packet Take 17 g by mouth daily.    . potassium chloride SA (K-DUR,KLOR-CON) 20 MEQ tablet Take 20 mEq by  mouth 2 (two) times daily.    . promethazine (PHENERGAN) 12.5 MG tablet Take 1 tablet (12.5 mg total) by mouth every 6 (six) hours as needed for nausea or vomiting. 30 tablet 0  . scopolamine (TRANSDERM-SCOP) 1 MG/3DAYS Place 1 patch onto the skin every 3 (three) days.    Marland Kitchen senna-docusate (SENOKOT-S) 8.6-50 MG per tablet Take 1 tablet by mouth at bedtime as needed for moderate constipation.    Marland Kitchen zolpidem (AMBIEN) 5 MG tablet Take 1 tablet (5 mg total) by mouth at bedtime as needed for sleep (insomnia). 30 tablet 0  . chlorpheniramine-HYDROcodone (TUSSIONEX) 10-8 MG/5ML LQCR Take 5 mLs by mouth every 12 (twelve) hours as needed for cough. (Patient not taking: Reported on 05/06/2014) 115 mL 0  . diphenhydrAMINE (BENADRYL) 25 mg capsule Take 50 mg by mouth daily as needed for allergies.    . metoprolol tartrate (LOPRESSOR) 25 MG tablet Take 1 tablet (25 mg total) by mouth 3 (three) times daily. 180 tablet 0  . oxyCODONE-acetaminophen (PERCOCET/ROXICET) 5-325 MG per tablet Take 1-2 tablets by mouth every 4 (four) hours as needed for severe pain. (Patient not taking: Reported on 05/06/2014) 30 tablet 0  . predniSONE (DELTASONE) 10 MG tablet Take 4 tabs  daily with food x 4 days, then 3 tabs daily x 4 days, then 2 tabs daily x 4 days, then 1 tab daily x4 days then stop. #40 (Patient not taking: Reported on 05/06/2014) 40 tablet 0  . vitamin B-12 (CYANOCOBALAMIN) 1000 MCG tablet Take 1,000 mcg by mouth daily.     No current facility-administered medications for this visit.   Facility-Administered Medications Ordered in Other Visits  Medication Dose Route Frequency Provider Last Rate Last Dose  . sodium chloride 0.9 % injection 10 mL  10 mL Intracatheter PRN Lloyd Huger, MD   10 mL at 05/06/14 1435    OBJECTIVE: Filed Vitals:   05/06/14 1527  BP: 112/67  Pulse: 97  Temp: 98.8 F (37.1 C)  Resp: 18     Body mass index is 25.71 kg/(m^2).    ECOG FS:0 - Asymptomatic  General: Well-developed,  well-nourished, no acute distress. Eyes: anicteric sclera. Lungs: Clear to auscultation bilaterally. Heart: Regular rate and rhythm. No rubs, murmurs, or gallops. Abdomen: Soft, nontender, nondistended. No organomegaly noted, normoactive bowel sounds. Musculoskeletal: 1+ bilateral lower extremity edema Neuro: Alert, answering all questions appropriately. Cranial nerves grossly intact. Skin: No rashes or petechiae noted. Psych: Normal affect.    LAB RESULTS:  Lab Results  Component Value Date   NA 135 05/06/2014   K 3.7 05/06/2014   CL 93* 05/06/2014   CO2 33* 05/06/2014   GLUCOSE 206* 05/06/2014   BUN 34* 05/06/2014   CREATININE 0.96 05/06/2014   CALCIUM 8.2* 05/06/2014   PROT 6.1* 05/06/2014   ALBUMIN 3.5 05/06/2014   AST 31 05/06/2014   ALT  38 05/06/2014   ALKPHOS 76 05/06/2014   BILITOT 0.5 05/06/2014   GFRNONAA >60 05/06/2014   GFRAA >60 05/06/2014    Lab Results  Component Value Date   WBC 12.1* 05/06/2014   NEUTROABS 11.2* 05/06/2014   HGB 11.7* 05/06/2014   HCT 36.3 05/06/2014   MCV 87.8 05/06/2014   PLT 236 05/06/2014     STUDIES: No results found.  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  EGFR and ALK negative.  PLAN:    1.  Lung cancer: Pathology results from open lung biopsy revealed clear lymphangitic spread of tumor. Proceed with cycle 8 of 31m/kg nivolumab. Patient last received Zometa on November 17, 2013. Return to clinic in 2 weeks for consideration of cycle 9. Will reimage in approximately May 2016.   2. Pain: Continue current narcotic regimen, patient does not wish to change at this time. 3. Poor appetite/nausea/dehydration: Improved. Continue anti-emetic regimen. 4. Dizziness: Resolved. 5. Hypotension: Improved, monitor. 6  Thrombocytopenia: Resoved. Monitor. 7. Hypokalemia: Resolved. Continue oral potassium supplementation as ordered. 8. Peripheral edema: Increase Lasix to 40 mg twice a day.  Patient  expressed understanding and was in agreement with this plan. She also understands that She can call clinic at any time with any questions, concerns, or complaints.   Adenocarcinoma of lung, stage 4   Staging form: Lung, AJCC 7th Edition     Clinical stage from 04/26/2014: T4, N0, M1 - Signed by TLloyd Huger MD on 04/26/2014   TLloyd Huger MD   05/10/2014 4:05 PM

## 2014-05-16 ENCOUNTER — Inpatient Hospital Stay
Admission: EM | Admit: 2014-05-16 | Discharge: 2014-05-19 | DRG: 194 | Disposition: A | Discharge status: Home / Self Care | Payer: Medicaid Other | Attending: Internal Medicine | Admitting: Internal Medicine

## 2014-05-16 ENCOUNTER — Inpatient Hospital Stay: Payer: Medicaid Other

## 2014-05-16 ENCOUNTER — Emergency Department: Payer: Medicaid Other

## 2014-05-16 ENCOUNTER — Encounter: Payer: Self-pay | Admitting: Emergency Medicine

## 2014-05-16 DIAGNOSIS — Z79891 Long term (current) use of opiate analgesic: Secondary | ICD-10-CM

## 2014-05-16 DIAGNOSIS — Z9981 Dependence on supplemental oxygen: Secondary | ICD-10-CM

## 2014-05-16 DIAGNOSIS — G893 Neoplasm related pain (acute) (chronic): Secondary | ICD-10-CM | POA: Diagnosis present

## 2014-05-16 DIAGNOSIS — J811 Chronic pulmonary edema: Secondary | ICD-10-CM | POA: Diagnosis present

## 2014-05-16 DIAGNOSIS — I824Z2 Acute embolism and thrombosis of unspecified deep veins of left distal lower extremity: Secondary | ICD-10-CM | POA: Diagnosis not present

## 2014-05-16 DIAGNOSIS — J449 Chronic obstructive pulmonary disease, unspecified: Secondary | ICD-10-CM | POA: Diagnosis present

## 2014-05-16 DIAGNOSIS — Z9221 Personal history of antineoplastic chemotherapy: Secondary | ICD-10-CM | POA: Diagnosis not present

## 2014-05-16 DIAGNOSIS — Z85118 Personal history of other malignant neoplasm of bronchus and lung: Secondary | ICD-10-CM

## 2014-05-16 DIAGNOSIS — C7951 Secondary malignant neoplasm of bone: Secondary | ICD-10-CM | POA: Diagnosis present

## 2014-05-16 DIAGNOSIS — Z79890 Hormone replacement therapy (postmenopausal): Secondary | ICD-10-CM | POA: Diagnosis not present

## 2014-05-16 DIAGNOSIS — R110 Nausea: Secondary | ICD-10-CM | POA: Diagnosis present

## 2014-05-16 DIAGNOSIS — I82412 Acute embolism and thrombosis of left femoral vein: Secondary | ICD-10-CM | POA: Diagnosis present

## 2014-05-16 DIAGNOSIS — J189 Pneumonia, unspecified organism: Principal | ICD-10-CM | POA: Diagnosis present

## 2014-05-16 DIAGNOSIS — C3491 Malignant neoplasm of unspecified part of right bronchus or lung: Secondary | ICD-10-CM | POA: Diagnosis not present

## 2014-05-16 DIAGNOSIS — R0602 Shortness of breath: Secondary | ICD-10-CM

## 2014-05-16 DIAGNOSIS — I10 Essential (primary) hypertension: Secondary | ICD-10-CM | POA: Diagnosis present

## 2014-05-16 DIAGNOSIS — Z801 Family history of malignant neoplasm of trachea, bronchus and lung: Secondary | ICD-10-CM | POA: Diagnosis not present

## 2014-05-16 DIAGNOSIS — C3490 Malignant neoplasm of unspecified part of unspecified bronchus or lung: Secondary | ICD-10-CM | POA: Diagnosis present

## 2014-05-16 DIAGNOSIS — Z79899 Other long term (current) drug therapy: Secondary | ICD-10-CM | POA: Diagnosis not present

## 2014-05-16 DIAGNOSIS — Z87891 Personal history of nicotine dependence: Secondary | ICD-10-CM

## 2014-05-16 DIAGNOSIS — R609 Edema, unspecified: Secondary | ICD-10-CM | POA: Diagnosis present

## 2014-05-16 DIAGNOSIS — R079 Chest pain, unspecified: Secondary | ICD-10-CM

## 2014-05-16 LAB — COMPREHENSIVE METABOLIC PANEL WITH GFR
ALT: 28 U/L (ref 14–54)
AST: 20 U/L (ref 15–41)
Albumin: 2.6 g/dL — ABNORMAL LOW (ref 3.5–5.0)
Alkaline Phosphatase: 61 U/L (ref 38–126)
Anion gap: 7 (ref 5–15)
BUN: 19 mg/dL (ref 6–20)
CO2: 27 mmol/L (ref 22–32)
Calcium: 8.4 mg/dL — ABNORMAL LOW (ref 8.9–10.3)
Chloride: 112 mmol/L — ABNORMAL HIGH (ref 101–111)
Creatinine, Ser: 0.77 mg/dL (ref 0.44–1.00)
GFR calc Af Amer: >60 mL/min
GFR calc non Af Amer: >60 mL/min
Glucose, Bld: 125 mg/dL — ABNORMAL HIGH (ref 65–99)
Potassium: 3.7 mmol/L (ref 3.5–5.1)
Sodium: 146 mmol/L — ABNORMAL HIGH (ref 135–145)
Total Bilirubin: 0.3 mg/dL (ref 0.3–1.2)
Total Protein: 5 g/dL — ABNORMAL LOW (ref 6.5–8.1)

## 2014-05-16 LAB — CBC
HCT: 32.5 % — ABNORMAL LOW (ref 35.0–47.0)
Hemoglobin: 10.5 g/dL — ABNORMAL LOW (ref 12.0–16.0)
MCH: 28.1 pg (ref 26.0–34.0)
MCHC: 32.4 g/dL (ref 32.0–36.0)
MCV: 86.7 fL (ref 80.0–100.0)
Platelets: 172 10*3/uL (ref 150–440)
RBC: 3.75 MIL/uL — ABNORMAL LOW (ref 3.80–5.20)
RDW: 17.7 % — ABNORMAL HIGH (ref 11.5–14.5)
WBC: 5.4 10*3/uL (ref 3.6–11.0)

## 2014-05-16 LAB — BRAIN NATRIURETIC PEPTIDE: B Natriuretic Peptide: 85 pg/mL (ref 0.0–100.0)

## 2014-05-16 LAB — GLUCOSE, CAPILLARY: GLUCOSE-CAPILLARY: 102 mg/dL — AB (ref 65–99)

## 2014-05-16 LAB — TROPONIN I: Troponin I: <0.03 ng/mL

## 2014-05-16 MED ORDER — ZOLPIDEM TARTRATE 5 MG PO TABS: 5.0000 mg | ORAL_TABLET | Freq: Every evening | ORAL | Status: DC | PRN

## 2014-05-16 MED ORDER — IOHEXOL 350 MG/ML SOLN
75.0000 mL | Freq: Once | INTRAVENOUS | Status: AC | PRN
  Administered 2014-05-16: 75 mL via INTRAVENOUS

## 2014-05-16 MED ORDER — LEVOFLOXACIN IN D5W 500 MG/100ML IV SOLN
500.0000 mg | INTRAVENOUS | Status: DC
  Administered 2014-05-17 – 2014-05-18 (×2): 500 mg via INTRAVENOUS
  Filled 2014-05-16 (×3): qty 100

## 2014-05-16 MED ORDER — PROMETHAZINE HCL 25 MG/ML IJ SOLN
INTRAMUSCULAR | Status: AC
  Administered 2014-05-16: 25 mg via INTRAVENOUS
  Filled 2014-05-16: qty 1

## 2014-05-16 MED ORDER — ONDANSETRON HCL 4 MG/2ML IJ SOLN: 4.0000 mg | Freq: Once | INTRAMUSCULAR | Status: AC

## 2014-05-16 MED ORDER — HYDROMORPHONE HCL 1 MG/ML IJ SOLN
INTRAMUSCULAR | Status: AC
  Administered 2014-05-16: 1 mg via INTRAVENOUS
  Filled 2014-05-16: qty 1

## 2014-05-16 MED ORDER — SENNOSIDES-DOCUSATE SODIUM 8.6-50 MG PO TABS
1.0000 | ORAL_TABLET | Freq: Every evening | ORAL | Status: DC | PRN
Start: 2014-05-16 — End: 2014-05-19

## 2014-05-16 MED ORDER — PROMETHAZINE HCL 25 MG/ML IJ SOLN
25.0000 mg | Freq: Four times a day (QID) | INTRAMUSCULAR | Status: DC | PRN
  Administered 2014-05-16 – 2014-05-19 (×11): 25 mg via INTRAVENOUS
  Filled 2014-05-16 (×10): qty 1

## 2014-05-16 MED ORDER — LEVOFLOXACIN IN D5W 750 MG/150ML IV SOLN
750.0000 mg | Freq: Once | INTRAVENOUS | Status: AC
  Administered 2014-05-16: 750 mg via INTRAVENOUS

## 2014-05-16 MED ORDER — IPRATROPIUM-ALBUTEROL 20-100 MCG/ACT IN AERS: 1.0000 | INHALATION_SPRAY | Freq: Four times a day (QID) | RESPIRATORY_TRACT | Status: DC

## 2014-05-16 MED ORDER — SCOPOLAMINE 1 MG/3DAYS TD PT72
1.0000 | MEDICATED_PATCH | TRANSDERMAL | Status: DC
  Administered 2014-05-16: 1.5 mg via TRANSDERMAL
  Filled 2014-05-16 (×2): qty 1

## 2014-05-16 MED ORDER — LEVOFLOXACIN IN D5W 750 MG/150ML IV SOLN
INTRAVENOUS | Status: AC
  Administered 2014-05-16: 750 mg via INTRAVENOUS
  Filled 2014-05-16: qty 150

## 2014-05-16 MED ORDER — FUROSEMIDE 40 MG PO TABS
40.0000 mg | ORAL_TABLET | Freq: Two times a day (BID) | ORAL | Status: DC
  Administered 2014-05-16 – 2014-05-19 (×6): 40 mg via ORAL
  Filled 2014-05-16 (×6): qty 1

## 2014-05-16 MED ORDER — ONDANSETRON HCL 4 MG PO TABS: 8.0000 mg | ORAL_TABLET | Freq: Three times a day (TID) | ORAL | Status: DC | PRN

## 2014-05-16 MED ORDER — PROMETHAZINE HCL 25 MG/ML IJ SOLN
12.5000 mg | Freq: Once | INTRAMUSCULAR | Status: AC
  Administered 2014-05-16: 12.5 mg via INTRAVENOUS

## 2014-05-16 MED ORDER — ONDANSETRON HCL 4 MG/2ML IJ SOLN
4.0000 mg | Freq: Four times a day (QID) | INTRAMUSCULAR | Status: DC | PRN
  Administered 2014-05-16 – 2014-05-19 (×6): 4 mg via INTRAVENOUS
  Filled 2014-05-16 (×6): qty 2

## 2014-05-16 MED ORDER — HYDROMORPHONE HCL 1 MG/ML IJ SOLN
1.0000 mg | Freq: Once | INTRAMUSCULAR | Status: AC
Start: 2014-05-16 — End: 2014-05-16
  Administered 2014-05-16: 1 mg via INTRAVENOUS

## 2014-05-16 MED ORDER — FENTANYL 50 MCG/HR TD PT72
100.0000 ug | MEDICATED_PATCH | TRANSDERMAL | Status: DC
  Administered 2014-05-16: 16:00:00 100 ug via TRANSDERMAL
  Filled 2014-05-16: qty 2

## 2014-05-16 MED ORDER — HYDROCOD POLST-CPM POLST ER 10-8 MG/5ML PO SUER: 5.0000 mL | Freq: Two times a day (BID) | ORAL | Status: DC | PRN

## 2014-05-16 MED ORDER — HYDROCOD POLST-CHLORPHEN POLST 10-8 MG/5ML PO LQCR: 5.0000 mL | Freq: Two times a day (BID) | ORAL | Status: DC | PRN

## 2014-05-16 MED ORDER — HYDROMORPHONE HCL 1 MG/ML IJ SOLN
1.0000 mg | INTRAMUSCULAR | Status: DC | PRN
  Administered 2014-05-16 – 2014-05-17 (×7): 1 mg via INTRAVENOUS
  Filled 2014-05-16 (×7): qty 1

## 2014-05-16 MED ORDER — FUROSEMIDE 10 MG/ML IJ SOLN
INTRAMUSCULAR | Status: AC
  Filled 2014-05-16: qty 4

## 2014-05-16 MED ORDER — ONDANSETRON HCL 4 MG/2ML IJ SOLN
4.0000 mg | Freq: Once | INTRAMUSCULAR | Status: AC
  Administered 2014-05-16: 4 mg via INTRAVENOUS
  Administered 2014-05-16: 08:00:00 via INTRAVENOUS

## 2014-05-16 MED ORDER — FUROSEMIDE 10 MG/ML IJ SOLN
40.0000 mg | Freq: Once | INTRAMUSCULAR | Status: AC
  Administered 2014-05-16: 40 mg via INTRAVENOUS

## 2014-05-16 MED ORDER — DEXAMETHASONE 4 MG PO TABS
4.0000 mg | ORAL_TABLET | Freq: Two times a day (BID) | ORAL | Status: DC
  Administered 2014-05-16 – 2014-05-19 (×7): 4 mg via ORAL
  Filled 2014-05-16 (×7): qty 1

## 2014-05-16 MED ORDER — IPRATROPIUM-ALBUTEROL 0.5-2.5 (3) MG/3ML IN SOLN
3.0000 mL | Freq: Four times a day (QID) | RESPIRATORY_TRACT | Status: DC
  Administered 2014-05-16: 3 mL via RESPIRATORY_TRACT
  Filled 2014-05-16 (×2): qty 3

## 2014-05-16 MED ORDER — OXYCODONE-ACETAMINOPHEN 5-325 MG PO TABS: 1.0000 | ORAL_TABLET | ORAL | Status: DC | PRN

## 2014-05-16 MED ORDER — IPRATROPIUM-ALBUTEROL 0.5-2.5 (3) MG/3ML IN SOLN: 3.0000 mL | Freq: Four times a day (QID) | RESPIRATORY_TRACT | Status: DC | PRN

## 2014-05-16 MED ORDER — PROMETHAZINE HCL 25 MG/ML IJ SOLN
INTRAMUSCULAR | Status: AC
  Administered 2014-05-16: 25 mg
  Filled 2014-05-16: qty 1

## 2014-05-16 MED ORDER — DIPHENHYDRAMINE HCL 25 MG PO CAPS: 50.0000 mg | ORAL_CAPSULE | Freq: Every day | ORAL | Status: DC | PRN

## 2014-05-16 MED ORDER — ONDANSETRON HCL 4 MG/2ML IJ SOLN
INTRAMUSCULAR | Status: AC
  Filled 2014-05-16: qty 2

## 2014-05-16 MED ORDER — POTASSIUM CHLORIDE CRYS ER 20 MEQ PO TBCR
20.0000 meq | EXTENDED_RELEASE_TABLET | Freq: Two times a day (BID) | ORAL | Status: DC
  Administered 2014-05-16 – 2014-05-19 (×7): 20 meq via ORAL
  Filled 2014-05-16 (×7): qty 1

## 2014-05-16 MED ORDER — ONDANSETRON HCL 4 MG/2ML IJ SOLN
INTRAMUSCULAR | Status: AC
  Administered 2014-05-16: 4 mg via INTRAVENOUS
  Filled 2014-05-16: qty 2

## 2014-05-16 MED ORDER — GABAPENTIN 300 MG PO CAPS
300.0000 mg | ORAL_CAPSULE | Freq: Three times a day (TID) | ORAL | Status: DC
  Administered 2014-05-16 – 2014-05-19 (×9): 300 mg via ORAL
  Filled 2014-05-16 (×9): qty 1

## 2014-05-16 MED ORDER — FENTANYL 25 MCG/HR TD PT72
25.0000 ug | MEDICATED_PATCH | TRANSDERMAL | Status: DC
  Administered 2014-05-16: 16:00:00 25 ug via TRANSDERMAL
  Filled 2014-05-16: qty 1

## 2014-05-16 MED ORDER — ENOXAPARIN SODIUM 80 MG/0.8ML ~~LOC~~ SOLN
1.0000 mg/kg | Freq: Two times a day (BID) | SUBCUTANEOUS | Status: DC
  Administered 2014-05-16 – 2014-05-17 (×3): 70 mg via SUBCUTANEOUS
  Administered 2014-05-18: 04:00:00 via SUBCUTANEOUS
  Filled 2014-05-16 (×6): qty 0.8

## 2014-05-16 MED ORDER — PANTOPRAZOLE SODIUM 40 MG PO TBEC
40.0000 mg | DELAYED_RELEASE_TABLET | Freq: Every day | ORAL | Status: DC
  Administered 2014-05-16 – 2014-05-19 (×4): 40 mg via ORAL
  Filled 2014-05-16 (×4): qty 1

## 2014-05-16 MED ORDER — PROMETHAZINE HCL 25 MG/ML IJ SOLN
INTRAMUSCULAR | Status: AC
  Filled 2014-05-16: qty 1

## 2014-05-16 MED ORDER — METOPROLOL TARTRATE 25 MG PO TABS
25.0000 mg | ORAL_TABLET | Freq: Three times a day (TID) | ORAL | Status: DC
  Administered 2014-05-16 – 2014-05-19 (×9): 25 mg via ORAL
  Filled 2014-05-16 (×9): qty 1

## 2014-05-16 MED ORDER — DRONABINOL 2.5 MG PO CAPS
5.0000 mg | ORAL_CAPSULE | Freq: Two times a day (BID) | ORAL | Status: DC
  Administered 2014-05-16 – 2014-05-19 (×6): 5 mg via ORAL
  Filled 2014-05-16 (×6): qty 2

## 2014-05-16 MED ORDER — HEPARIN SODIUM (PORCINE) 5000 UNIT/ML IJ SOLN
5000.0000 [IU] | Freq: Three times a day (TID) | INTRAMUSCULAR | Status: DC
  Administered 2014-05-16: 16:00:00 5000 [IU] via SUBCUTANEOUS
  Filled 2014-05-16: qty 1

## 2014-05-16 NOTE — ED Notes (Signed)
Pt arrives to ER via EMS from home. Pt is undergoing therapy for lung cancer, states worse SOB, on 2LO2 at baseline.

## 2014-05-16 NOTE — ED Notes (Signed)
Patient to CT.

## 2014-05-16 NOTE — ED Notes (Signed)
Mickel Baas, RN and Dawn, RN at bedside to attempt port a cath access

## 2014-05-16 NOTE — ED Notes (Signed)
Attempted to access patient's port-a-cath (right upper chest), flushed easily but patient complained of pain.  Unable to get blood back when attempted.  Again flushed cath with small amount of saline and patient again complained of pain at port-a-cath site.  Site de-accessed and dressing placed.  Explained to patient we would attempt a peripheral, patient requesting that port be tried again.  Explained to patient that it would not be reacessed at this time.  MD notified.

## 2014-05-16 NOTE — ED Notes (Signed)
MD at bedside. 

## 2014-05-16 NOTE — Plan of Care (Signed)
Problem: Consults Goal: Nutrition Consult-if indicated Outcome: Not Progressing Patient admitted to [facility] today from ER possible pneumonia and positive for blood clot in left leg, started on lovenox this evening, bedrest except for bathroom, complaining of pain and nausea frequently with no vomiting.

## 2014-05-16 NOTE — ED Provider Notes (Signed)
[facility] Emergency Department Provider Note  ____________________________________________  Time seen: Approximately 339 AM  I have reviewed the triage vital signs and the nursing notes.   HISTORY  Chief Complaint Shortness of Breath    HPI [name] is a 54 y.o. female with a history of lung cancer who comes in today with shortness of breath and leg swelling and leg pain. She reports that this is been going on for approximately 6-8 weeks. She reports that her doctor has increased her Lasix but she has not been having any relief of the leg swelling. She reports that her legs actually more swollen than they had been previously. The patient reports that she uses fentanyl patches and Dilaudid for pain but nothing has been helping. She reports that she has been short of breathhistorically and wears 2 L of oxygen by  nasal cannula at home but she reports that tonight she feels more short of breath than previous. The patient reports that she sees Dr. Grayland Ormond as her oncologist and takes optivo as chemotherapy. The patient reports that her pain as a 10 out of 10 in intensity and she is very nauseous. The patient also reports that she does have some chest pain and dizziness. She's had some nausea with no vomiting   Past Medical History  Diagnosis Date  . Pneumonia   . Lung cancer     Patient Active Problem List   Diagnosis Date Noted  . Pleuritic chest pain 01/21/2013  . Pulmonary infiltrates 01/12/2013  . Adenocarcinoma of lung, stage 4 01/12/2013  . Pericardial tamponade 01/11/2013    Past Surgical History  Procedure Laterality Date  . Video bronchoscopy Bilateral 01/14/2013    Procedure: VIDEO BRONCHOSCOPY WITHOUT FLUORO;  Surgeon: Wilhelmina Mcardle, MD;  Location: [facility] ENDOSCOPY;  Service: Cardiopulmonary;  Laterality: Bilateral;  . Video bronchoscopy N/A 01/27/2013    Procedure: VIDEO BRONCHOSCOPY;  Surgeon: Ivin Poot, MD;  Location: [place];   Service: Thoracic;  Laterality: N/A;  . Video assisted thoracoscopy Right 01/27/2013    Procedure: VIDEO ASSISTED THORACOSCOPY;  Surgeon: Ivin Poot, MD;  Location: [place];  Service: Thoracic;  Laterality: Right;  . Lung biopsy Right 01/27/2013    Procedure: LUNG BIOPSY;  Surgeon: Ivin Poot, MD;  Location: [place];  Service: Thoracic;  Laterality: Right;  . Pericardial tap N/A 01/11/2013    Procedure: PERICARDIAL TAP;  Surgeon: Blane Ohara, MD;  Location[facility] CATH LAB;  Service: Cardiovascular;  Laterality: N/A;    Current Outpatient Rx  Name  Route  Sig  Dispense  Refill  . chlorpheniramine-HYDROcodone (TUSSIONEX) 10-8 MG/5ML LQCR   Oral   Take 5 mLs by mouth every 12 (twelve) hours as needed for cough. Patient not taking: Reported on 05/06/2014   115 mL   0   . diphenhydrAMINE (BENADRYL) 25 mg capsule   Oral   Take 50 mg by mouth daily as needed for allergies.         Marland Kitchen dronabinol (MARINOL) 5 MG capsule   Oral   Take 5 mg by mouth 2 (two) times daily before a meal.         . fentaNYL (DURAGESIC - DOSED MCG/HR) 100 MCG/HR   Transdermal   Place 100 mcg onto the skin every 3 (three) days.         . fentaNYL (DURAGESIC - DOSED MCG/HR) 25 MCG/HR patch   Transdermal   Place 1 patch (25 mcg total) onto the skin  every 3 (three) days.   10 patch   0   . furosemide (LASIX) 40 MG tablet   Oral   Take 1 tablet (40 mg total) by mouth 2 (two) times daily.   60 tablet   1   . gabapentin (NEURONTIN) 300 MG capsule   Oral   Take 300 mg by mouth 3 (three) times daily.         Marland Kitchen HYDROmorphone (DILAUDID) 4 MG tablet   Oral   Take by mouth every 4 (four) hours as needed for severe pain.         Marland Kitchen lidocaine (LIDODERM) 5 %   Transdermal   Place 1 patch onto the skin daily. Remove & Discard patch within 12 hours or as directed by MD         . metoprolol tartrate (LOPRESSOR) 25 MG tablet   Oral   Take 1 tablet (25 mg total) by mouth 3 (three) times daily.   180  tablet   0   . omeprazole (PRILOSEC) 20 MG capsule   Oral   Take 20 mg by mouth daily.         . ondansetron (ZOFRAN) 8 MG tablet   Oral   Take 8 mg by mouth every 8 (eight) hours as needed for nausea or vomiting.         Marland Kitchen oxyCODONE-acetaminophen (PERCOCET/ROXICET) 5-325 MG per tablet   Oral   Take 1-2 tablets by mouth every 4 (four) hours as needed for severe pain. Patient not taking: Reported on 05/06/2014   30 tablet   0   . polyethylene glycol (MIRALAX / GLYCOLAX) packet   Oral   Take 17 g by mouth daily.         . potassium chloride SA (K-DUR,KLOR-CON) 20 MEQ tablet   Oral   Take 20 mEq by mouth 2 (two) times daily.         . predniSONE (DELTASONE) 10 MG tablet      Take 4 tabs  daily with food x 4 days, then 3 tabs daily x 4 days, then 2 tabs daily x 4 days, then 1 tab daily x4 days then stop. #40 Patient not taking: Reported on 05/06/2014   40 tablet   0   . promethazine (PHENERGAN) 12.5 MG tablet   Oral   Take 1 tablet (12.5 mg total) by mouth every 6 (six) hours as needed for nausea or vomiting.   30 tablet   0   . scopolamine (TRANSDERM-SCOP) 1 MG/3DAYS   Transdermal   Place 1 patch onto the skin every 3 (three) days.         Marland Kitchen senna-docusate (SENOKOT-S) 8.6-50 MG per tablet   Oral   Take 1 tablet by mouth at bedtime as needed for moderate constipation.         . vitamin B-12 (CYANOCOBALAMIN) 1000 MCG tablet   Oral   Take 1,000 mcg by mouth daily.         Marland Kitchen zolpidem (AMBIEN) 5 MG tablet   Oral   Take 1 tablet (5 mg total) by mouth at bedtime as needed for sleep (insomnia).   30 tablet   0     Allergies No known allergies  Family History  Problem Relation Age of Onset  . Coronary artery disease Mother 88  . Lung cancer Mother   . Prostate cancer Father     Social History History  Substance Use Topics  . Smoking status: Former Research scientist (life sciences)  .  Smokeless tobacco: Never Used  . Alcohol Use: Yes     Comment: occasionally has a drink     Review of Systems Constitutional: No fever/chills Eyes: No visual changes. ENT: No sore throat. Cardiovascular:  chest pain. Respiratory:  shortness of breath. Gastrointestinal: Nausea,  No abdominal pain,  no vomiting.   Genitourinary: Negative for dysuria. Musculoskeletal: Negative for back pain. Skin: Negative for rash. Neurological: Dizziness  Hematological/Lymphatic:Lower extremity swelling  10-point ROS otherwise negative.  ____________________________________________   PHYSICAL EXAM:  VITAL SIGNS: ED Triage Vitals  Enc Vitals Group     BP 05/16/14 0315 119/76 mmHg     Pulse Rate 05/16/14 0315 120     Resp 05/16/14 0317 22     Temp 05/16/14 0317 98.1 F (36.7 C)     Temp Source 05/16/14 0317 Oral     SpO2 05/16/14 0315 90 %     Weight 05/16/14 0314 150 lb (68.04 kg)     Height 05/16/14 0314 '5\' 6"'$  (1.676 m)     Head Cir --      Peak Flow --      Pain Score 05/16/14 0315 8     Pain Loc --      Pain Edu? --      Excl. in [place]? --     Constitutional: Alert and oriented. Moderate distress Eyes: Conjunctivae are normal. PERRL. EOMI. Head: Atraumatic. Nose: No congestion/rhinnorhea. Mouth/Throat: Mucous membranes are moist.  Oropharynx non-erythematous. Cardiovascular: Cardiac regular rhythm. Grossly normal heart sounds.  Good peripheral circulation. Respiratory: Normal respiratory effort.  No retractions. Lungs CTAB. Gastrointestinal: Soft and nontender. No distention. No abdominal bruits. No CVA tenderness. Genitourinary: Deferred Musculoskeletal: Lower extremity edema bilaterally  Neurologic:  Normal speech and language. No gross focal neurologic deficits are appreciated. Speech is normal. No gait instability. Skin:  Skin is warm, dry and intact. No rash noted. Psychiatric: Mood and affect are normal. Speech and behavior are normal.  ____________________________________________   LABS (all labs ordered are listed, but only abnormal results are  displayed)  Labs Reviewed  CBC - Abnormal; Notable for the following:    RBC 3.75 (*)    Hemoglobin 10.5 (*)    HCT 32.5 (*)    RDW 17.7 (*)    All other components within normal limits  COMPREHENSIVE METABOLIC PANEL - Abnormal; Notable for the following:    Sodium 146 (*)    Chloride 112 (*)    Glucose, Bld 125 (*)    Calcium 8.4 (*)    Total Protein 5.0 (*)    Albumin 2.6 (*)    All other components within normal limits  BRAIN NATRIURETIC PEPTIDE  TROPONIN I   ____________________________________________  EKG  ED ECG REPORT   Date: 05/16/2014  EKG Time: 319  Rate: 116  Rhythm: sinus tachycardia  Axis: Normal  Intervals:none  ST&T Change: Diffuse T-wave flattening  ____________________________________________  RADIOLOGY  Chest x-ray: Cardiac enlargement with pulmonary vascular congestion and perihilar edema demonstrating mild progression since prior study ____________________________________________   PROCEDURES  Procedure(s) performed: None  Critical Care performed: No  ____________________________________________   INITIAL IMPRESSION / ASSESSMENT AND PLAN / ED COURSE  Pertinent labs & imaging results that were available during my care of the patient were reviewed by me and considered in my medical decision making (see chart for details).  This is a 54 year old female who comes in tonight with some chest pain and shortness of breath and swelling in her legs. The patient has been evaluated for  this by her physician previously but reports that she does feel more short of breath tonight. After the blood work and x-rays were received I decided to give the patient does of Lasix 40 mg IV 1. Although the patient's BNP is unremarkable the patient is still retaining fluid. I will admit the patient to the hospitalist service.  Since the patient is tachycardic and short of breath I will perform a CT angiography of her chest and short that she does not have a PE. The  patient's care was signed out to Dr. Edgar Frisk who will follow-up the results of the CT angios and then admit the patient for her shortness of breath and pulmonary edema. ____________________________________________   FINAL CLINICAL IMPRESSION(S) / ED DIAGNOSES  Final diagnoses:  Dyspnea Pulmonary edema      Loney Hering, MD 05/16/14 [phone]

## 2014-05-16 NOTE — ED Notes (Signed)
Per admitting MD, patient to receive '750mg'$  Levaquin now, then to have '500mg'$  for next dose.

## 2014-05-16 NOTE — ED Notes (Signed)
Patient up to restroom without difficulty.   

## 2014-05-16 NOTE — ED Notes (Signed)
In to give pt IV lasix as ordered by MD; pt refusing; says she's been taking that for 2 1/2 weeks and it's "not doing any good"; pt wants to speak with MD-"send her in now"; MD aware pt would like to speak with her and has refused medication

## 2014-05-16 NOTE — ED Notes (Signed)
Explaining medications to patient.  Patient states she can't take Zofran, when ask patient about what type of reaction patient responds that it does not work.  Patients states that she needs Phenergan, MD and primary nurse notified.

## 2014-05-16 NOTE — ED Notes (Signed)
Patient continues to report nausea. Phenergan dose given, as well as gingerale.

## 2014-05-16 NOTE — ED Notes (Signed)
While preparing to access patient's port-a-cath instructed patient about the procedure.  Patient refused to wear mask during the procedure.  Explained reasoning for wearing the mask to patient, pt verbalized understanding, but continues to refuse to wear provided mask.  Patient states "I'll turn my head the other way, but I'm not wearing a mask."

## 2014-05-16 NOTE — Progress Notes (Signed)
ANTICOAGULATION CONSULT NOTE - Initial Consult  Pharmacy Consult for Lovenox Indication: DVT  Allergies  Allergen Reactions  . No Known Allergies     Patient Measurements: Height: '5\' 6"'$  (167.6 cm) Weight: 150 lb (68.04 kg) IBW/kg (Calculated) : 59.3 Heparin Dosing Weight: N/A  Vital Signs: Temp: 98 F (36.7 C) (05/15 1546) Temp Source: Oral (05/15 1546) BP: 131/81 mmHg (05/15 1546) Pulse Rate: 67 (05/15 1546)  Labs:  Recent Labs  05/16/14 0421  HGB 10.5*  HCT 32.5*  PLT 172  CREATININE 0.77  TROPONINI <0.03    Estimated Creatinine Clearance: 77.9 mL/min (by C-G formula based on Cr of 0.77).   Medical History: Past Medical History  Diagnosis Date  . Pneumonia   . Lung cancer     Medications:  Scheduled:  . dexamethasone  4 mg Oral Q12H  . dronabinol  5 mg Oral BID AC  . enoxaparin (LOVENOX) injection  1 mg/kg Subcutaneous Q12H  . fentaNYL  100 mcg Transdermal Q72H  . fentaNYL  25 mcg Transdermal Q72H  . furosemide  40 mg Oral BID  . gabapentin  300 mg Oral TID  . ipratropium-albuterol  3 mL Nebulization Q6H  . levofloxacin (LEVAQUIN) IV  500 mg Intravenous Q24H  . metoprolol tartrate  25 mg Oral TID  . pantoprazole  40 mg Oral Daily  . potassium chloride SA  20 mEq Oral BID  . scopolamine  1 patch Transdermal Q72H    Assessment: Patient to be started on Lovenox '1mg'$ /kg SQ q12h for DVT.  Goal of Therapy:  Monitor SCr Monitor platelets by anticoagulation protocol: Yes   Plan:  Lovenox '70mg'$  SQ Q12h.  Paulina Fusi, PharmD, BCPS 05/16/2014 4:44 PM

## 2014-05-16 NOTE — H&P (Signed)
[facility] [place] NAME: [name]    MR#:  [id number]  DATE OF BIRTH:  23-Nov-1960  DATE OF ADMISSION:  05/16/2014  PRIMARY CARE PHYSICIAN: No primary care provider on file.   REQUESTING/REFERRING PHYSICIAN: Dr.Kinner  CHIEF COMPLAINT:   Chief Complaint  Patient presents with  . Shortness of Breath    Pt arrives to ER via EMS from home. Pt is undergoing therapy for lung cancer, states worse SOB, on 2LO2 at baseline.    HISTORY OF PRESENT ILLNESS: [name]  is a 54 y.o. female with a known history of stage 4 Lung cancer, On chemo. Came to ER as she feels more SOB and have b/l leg and right sided chest pain with some chills and b/l leg swelling since yesterday. Baseline- 2 ltr oxygen use at home. ER- did CT chest to r/o PE- negative , but showed some consolidation on right lung. So given as admission for pneumonia.   PAST MEDICAL HISTORY:   Past Medical History  Diagnosis Date  . Pneumonia   . Lung cancer      On chemo by Dr. Grayland Ormond  PAST SURGICAL HISTORY:  Past Surgical History  Procedure Laterality Date  . Video bronchoscopy Bilateral 01/14/2013    Procedure: VIDEO BRONCHOSCOPY WITHOUT FLUORO;  Surgeon: Wilhelmina Mcardle, MD;  Location: [facility] ENDOSCOPY;  Service: Cardiopulmonary;  Laterality: Bilateral;  . Video bronchoscopy N/A 01/27/2013    Procedure: VIDEO BRONCHOSCOPY;  Surgeon: Ivin Poot, MD;  Location: [place];  Service: Thoracic;  Laterality: N/A;  . Video assisted thoracoscopy Right 01/27/2013    Procedure: VIDEO ASSISTED THORACOSCOPY;  Surgeon: Ivin Poot, MD;  Location: [place];  Service: Thoracic;  Laterality: Right;  . Lung biopsy Right 01/27/2013    Procedure: LUNG BIOPSY;  Surgeon: Ivin Poot, MD;  Location: [place];  Service: Thoracic;  Laterality: Right;  . Pericardial tap N/A 01/11/2013    Procedure: PERICARDIAL TAP;  Surgeon: Blane Ohara, MD;  Location: [facility] CATH LAB;  Service: Cardiovascular;   Laterality: N/A;    SOCIAL HISTORY:  History  Substance Use Topics  . Smoking status: Former Research scientist (life sciences)  . Smokeless tobacco: Never Used  . Alcohol Use: Yes     Comment: occasionally has a drink    FAMILY HISTORY:  Family History  Problem Relation Age of Onset  . Coronary artery disease Mother 58  . Lung cancer Mother   . Prostate cancer Father     DRUG ALLERGIES:  Allergies  Allergen Reactions  . No Known Allergies     REVIEW OF SYSTEMS:   CONSTITUTIONAL: No fever, fatigue or weakness.  EYES: No blurred or double vision.  EARS, NOSE, AND THROAT: No tinnitus or ear pain.  RESPIRATORY: No cough, positive for shortness of breath, no wheezing or hemoptysis.  CARDIOVASCULAR: right side chest pain, no orthopnea, positive b/l leg edema.  GASTROINTESTINAL: No nausea, vomiting, diarrhea or abdominal pain.  GENITOURINARY: No dysuria, hematuria.  ENDOCRINE: No polyuria, nocturia,  HEMATOLOGY: No anemia, easy bruising or bleeding SKIN: No rash or lesion. MUSCULOSKELETAL: No joint pain or arthritis.   NEUROLOGIC: No tingling, numbness, weakness.  PSYCHIATRY: No anxiety or depression.   MEDICATIONS AT HOME:  Prior to Admission medications   Medication Sig Start Date End Date Taking? Authorizing Provider  conjugated estrogens (PREMARIN) vaginal cream 1gm pv qhs x 2 weeks, then two times per week x 2 weeks, then weekly for maintenance 05/14/14  Yes  Historical Provider, MD  dexamethasone (DECADRON) 4 MG tablet Take 4 mg by mouth every 12 (twelve) hours. 01/28/14  Yes Historical Provider, MD  dronabinol (MARINOL) 5 MG capsule Take 5 mg by mouth 2 (two) times daily before a meal.   Yes Historical Provider, MD  fentaNYL (DURAGESIC - DOSED MCG/HR) 100 MCG/HR Place 100 mcg onto the skin every 3 (three) days.   Yes Historical Provider, MD  fentaNYL (DURAGESIC - DOSED MCG/HR) 25 MCG/HR patch Place 1 patch (25 mcg total) onto the skin every 3 (three) days. 05/06/14  Yes Lloyd Huger, MD   furosemide (LASIX) 40 MG tablet Take 1 tablet (40 mg total) by mouth 2 (two) times daily. 05/06/14  Yes Lloyd Huger, MD  gabapentin (NEURONTIN) 300 MG capsule Take 300 mg by mouth 3 (three) times daily.   Yes Historical Provider, MD  HYDROmorphone (DILAUDID) 4 MG tablet Take by mouth every 4 (four) hours as needed for severe pain.   Yes Historical Provider, MD  Ipratropium-Albuterol (COMBIVENT) 20-100 MCG/ACT AERS respimat Inhale 1 puff into the lungs 4 (four) times daily as needed. For shortness of breath and wheezing 01/28/14  Yes Historical Provider, MD  lidocaine (LIDODERM) 5 % Place 1 patch onto the skin daily. Remove & Discard patch within 12 hours or as directed by MD   Yes Historical Provider, MD  metoCLOPramide (REGLAN) 5 MG tablet Take 1 tablet by mouth 4 (four) times daily. 04/17/14  Yes Historical Provider, MD  metoprolol tartrate (LOPRESSOR) 25 MG tablet Take 1 tablet (25 mg total) by mouth 3 (three) times daily. 05/07/14  Yes Lloyd Huger, MD  omeprazole (PRILOSEC) 20 MG capsule Take 20 mg by mouth daily.   Yes Historical Provider, MD  ondansetron (ZOFRAN) 8 MG tablet Take 8 mg by mouth every 8 (eight) hours as needed for nausea or vomiting.   Yes Historical Provider, MD  potassium chloride SA (K-DUR,KLOR-CON) 20 MEQ tablet Take 20 mEq by mouth 2 (two) times daily.   Yes Historical Provider, MD  promethazine (PHENERGAN) 25 MG tablet Take 1 tablet by mouth every 6 (six) hours as needed. For nausea and vomiting. 05/11/14  Yes Historical Provider, MD  scopolamine (TRANSDERM-SCOP) 1 MG/3DAYS Place 1 patch onto the skin every 3 (three) days.   Yes Historical Provider, MD  chlorpheniramine-HYDROcodone (TUSSIONEX) 10-8 MG/5ML LQCR Take 5 mLs by mouth every 12 (twelve) hours as needed for cough. Patient not taking: Reported on 05/06/2014 02/02/13   Erick Colace, NP  diphenhydrAMINE (BENADRYL) 25 mg capsule Take 50 mg by mouth daily as needed for allergies.    Historical Provider, MD   oxyCODONE-acetaminophen (PERCOCET/ROXICET) 5-325 MG per tablet Take 1-2 tablets by mouth every 4 (four) hours as needed for severe pain. Patient not taking: Reported on 05/06/2014 02/02/13   Erick Colace, NP  predniSONE (DELTASONE) 10 MG tablet Take 4 tabs  daily with food x 4 days, then 3 tabs daily x 4 days, then 2 tabs daily x 4 days, then 1 tab daily x4 days then stop. #40 Patient not taking: Reported on 05/06/2014 02/02/13   Erick Colace, NP  senna-docusate (SENOKOT-S) 8.6-50 MG per tablet Take 1 tablet by mouth at bedtime as needed for moderate constipation. 02/02/13   Erick Colace, NP  zolpidem (AMBIEN) 5 MG tablet Take 1 tablet (5 mg total) by mouth at bedtime as needed for sleep (insomnia). Patient not taking: Reported on 05/16/2014 02/02/13   Erick Colace, NP  PHYSICAL EXAMINATION:   VITAL SIGNS: Blood pressure 121/84, pulse 107, temperature 98.1 F (36.7 C), temperature source Oral, resp. rate 15, height '5\' 6"'$  (1.676 m), weight 68.04 kg (150 lb), SpO2 93 %.  GENERAL:  54 y.o.-year-old patient lying in the bed with no acute distress.  EYES: Pupils equal, round, reactive to light and accommodation. No scleral icterus. Extraocular muscles intact.  HEENT: Head atraumatic, normocephalic. Oropharynx and nasopharynx clear.  NECK:  Supple, no jugular venous distention. No thyroid enlargement, no tenderness.  LUNGS: equal breath sounds bilaterally, no wheezing, lower lobe crepitation. No use of accessory muscles of respiration.  CARDIOVASCULAR: S1, S2 normal. No murmurs, rubs, or gallops.  ABDOMEN: Soft, nontender, nondistended. Bowel sounds present. No organomegaly or mass.  EXTREMITIES: positive pedal edema, no cyanosis, or clubbing. Tender calf b/l. NEUROLOGIC: Cranial nerves II through XII are intact. Muscle strength 5/5 in all extremities. Sensation intact. Gait not checked.  PSYCHIATRIC: The patient is alert and oriented x 3.  SKIN: No obvious rash, lesion, or ulcer.    LABORATORY PANEL:   CBC  Recent Labs Lab 05/16/14 0421  WBC 5.4  HGB 10.5*  HCT 32.5*  PLT 172  MCV 86.7  MCH 28.1  MCHC 32.4  RDW 17.7*   ------------------------------------------------------------------------------------------------------------------  Chemistries   Recent Labs Lab 05/16/14 0421  NA 146*  K 3.7  CL 112*  CO2 27  GLUCOSE 125*  BUN 19  CREATININE 0.77  CALCIUM 8.4*  AST 20  ALT 28  ALKPHOS 61  BILITOT 0.3   ------------------------------------------------------------------------------------------------------------------ estimated creatinine clearance is 77.9 mL/min (by C-G formula based on Cr of 0.77). ------------------------------------------------------------------------------------------------------------------ No results for input(s): TSH, T4TOTAL, T3FREE, THYROIDAB in the last 72 hours.  Invalid input(s): FREET3   Cardiac Enzymes  Recent Labs Lab 05/16/14 0421  TROPONINI <0.03    Urinalysis    Component Value Date/Time   COLORURINE YELLOW 01/27/2013 0856   APPEARANCEUR CLEAR 01/27/2013 0856   LABSPEC 1.033* 01/27/2013 0856   PHURINE 6.0 01/27/2013 0856   GLUCOSEU NEGATIVE 01/27/2013 0856   HGBUR NEGATIVE 01/27/2013 0856   BILIRUBINUR NEGATIVE 01/27/2013 0856   KETONESUR NEGATIVE 01/27/2013 0856   PROTEINUR NEGATIVE 01/27/2013 0856   UROBILINOGEN 0.2 01/27/2013 0856   NITRITE NEGATIVE 01/27/2013 0856   LEUKOCYTESUR NEGATIVE 01/27/2013 0856     RADIOLOGY: Ct Angio Chest Pe W/cm &/or Wo Cm  05/16/2014   CLINICAL DATA:  Lower extremity swelling for 9 weeks. Increasing shortness of breath. 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  EXAM: CT ANGIOGRAPHY CHEST WITH CONTRAST  TECHNIQUE: Multidetector CT imaging of the chest was performed using the standard protocol during bolus administration of intravenous contrast. Multiplanar CT image reconstructions and MIPs were obtained to evaluate the vascular  anatomy.  CONTRAST:  97m OMNIPAQUE IOHEXOL 350 MG/ML SOLN  COMPARISON:  March 06, 2014  FINDINGS: There is no pulmonary embolus. There is a small left pleural effusion and a moderate right pleural effusion, decreased in size compared to prior chest CT. There are extensive nodularity and increased pulmonary interstitium throughout multiple lobes of bilateral lungs consistent with patient's known cancer. There is more confluent consolidation of the right lower lobe, superimposed pneumonia is not excluded. There is no mediastinal or hilar lymphadenopathy. The heart size is normal. Minimal pericardial effusion is identified. Images of the visualized upper abdominal structures demonstrate fatty infiltration of liver. Scarring of left kidney is noted. There is a small hiatal hernia. The previously noted bony sclerosis are unchanged.  Review of the MIP images confirms the above findings.  IMPRESSION: No pulmonary embolus.  Extensive nodularity in increased pulmonary interstitium throughout all lobes of bilateral lungs consistent with patient's known cancer. There is more confluent consolidation of the right lower lobe, superimposed pneumonia is not excluded.   Electronically Signed   By: Abelardo Diesel M.D.   On: 05/16/2014 09:11   Dg Chest Portable 1 View  05/16/2014   CLINICAL DATA:  Increased shortness of breath and right-sided chest pain since yesterday.  EXAM: PORTABLE CHEST - 1 VIEW  COMPARISON:  04/01/2014  FINDINGS: Central venous catheter remains unchanged in position. Shallow inspiration. Mild cardiac enlargement with prominent pulmonary vascular congestion. Perihilar airspace and interstitial infiltrates suggesting edema. No blunting of costophrenic angles. No pneumothorax. Probable mild progression since previous study. Old right rib fractures.  IMPRESSION: Cardiac enlargement with pulmonary vascular congestion and perihilar edema demonstrating mild progression since prior study.   Electronically Signed   By:  Lucienne Capers M.D.   On: 05/16/2014 04:56    IMPRESSION AND PLAN:  * Pneumonia   IV levaquin, B cx sent by ER.  * Stage 4 lung cancer   Follows with Cancer center- on chemotherapy.   Continue same.  * chronic pain due to cancer   Continue fentanyl patch and percocet, will add dilaudid PRN for pain.  * nausea    Will give phenergan IV PRN.  * b/l Leg edema and pain   Will geet DVT studies, CT negative for PE.  All the records are reviewed and case discussed with ED provider. Management plans discussed with the patient, family and they are in agreement.  CODE STATUS:FUll    TOTAL TIME TAKING CARE OF THIS PATIENT: 50 minutes.    Vaughan Basta M.D on 05/16/2014   Between 7am to 6pm - Pager - [phone]  After 6pm go to [email or web address] - password [email or web address] [facility]  [facility]  Office  [phone]  CC: Primary care physician; No primary care provider on file.

## 2014-05-16 NOTE — ED Notes (Signed)
Went in to answer pts call light, pt sitting up on bed stating that she is more short of breath, asked patient if I could place her oxygen back on her and she states "No!, I just took it off". Informed pts primary nurse that pt c/o increased shortness of breath.

## 2014-05-16 NOTE — ED Notes (Addendum)
Pt using callbell; unhooked from monitor to get up to toilet in room; after sitting up pt says "nope; not going to happen" and laid back down in the bed; offered bedpan which pt declined; encouraged to call if she changes her mind; given ice chips as requested and allowed by Dr Dahlia Client; pt declines to have blood pressure cuff put back on

## 2014-05-16 NOTE — ED Notes (Signed)
Pt given zofran IV; was informed unable to give phenergan as requested through small bore IV in hand; offered Phenergan IM or suppository to which pt responded she would take the zofran because "I don't have any other choice"

## 2014-05-17 DIAGNOSIS — C7951 Secondary malignant neoplasm of bone: Secondary | ICD-10-CM

## 2014-05-17 DIAGNOSIS — I824Z2 Acute embolism and thrombosis of unspecified deep veins of left distal lower extremity: Secondary | ICD-10-CM

## 2014-05-17 DIAGNOSIS — Z79899 Other long term (current) drug therapy: Secondary | ICD-10-CM

## 2014-05-17 DIAGNOSIS — R0602 Shortness of breath: Secondary | ICD-10-CM

## 2014-05-17 DIAGNOSIS — J189 Pneumonia, unspecified organism: Principal | ICD-10-CM

## 2014-05-17 DIAGNOSIS — C3491 Malignant neoplasm of unspecified part of right bronchus or lung: Secondary | ICD-10-CM

## 2014-05-17 LAB — BASIC METABOLIC PANEL
Anion gap: 6 (ref 5–15)
BUN: 16 mg/dL (ref 6–20)
CHLORIDE: 102 mmol/L (ref 101–111)
CO2: 32 mmol/L (ref 22–32)
Calcium: 8.2 mg/dL — ABNORMAL LOW (ref 8.9–10.3)
Creatinine, Ser: 0.67 mg/dL (ref 0.44–1.00)
GFR calc non Af Amer: >60 mL/min (ref >60)
GLUCOSE: 179 mg/dL — AB (ref 65–99)
Potassium: 4.9 mmol/L (ref 3.5–5.1)
SODIUM: 140 mmol/L (ref 135–145)

## 2014-05-17 LAB — CBC
HCT: 37.4 % (ref 35.0–47.0)
HEMOGLOBIN: 11.9 g/dL — AB (ref 12.0–16.0)
MCH: 27.5 pg (ref 26.0–34.0)
MCHC: 31.9 g/dL — ABNORMAL LOW (ref 32.0–36.0)
MCV: 86.1 fL (ref 80.0–100.0)
Platelets: 210 10*3/uL (ref 150–440)
RBC: 4.34 MIL/uL (ref 3.80–5.20)
RDW: 18.4 % — ABNORMAL HIGH (ref 11.5–14.5)
WBC: 7.3 10*3/uL (ref 3.6–11.0)

## 2014-05-17 LAB — GLUCOSE, CAPILLARY
GLUCOSE-CAPILLARY: 196 mg/dL — AB (ref 65–99)
GLUCOSE-CAPILLARY: 138 mg/dL — AB (ref 65–99)
Glucose-Capillary: 193 mg/dL — ABNORMAL HIGH (ref 65–99)

## 2014-05-17 MED ORDER — HYDROMORPHONE HCL 2 MG PO TABS: 4.0000 mg | ORAL_TABLET | ORAL | Status: DC | PRN

## 2014-05-17 MED ORDER — HYDROMORPHONE HCL 1 MG/ML IJ SOLN
2.0000 mg | INTRAMUSCULAR | Status: DC | PRN
  Administered 2014-05-17 – 2014-05-19 (×13): 2 mg via INTRAVENOUS
  Filled 2014-05-17 (×13): qty 2

## 2014-05-17 NOTE — Plan of Care (Signed)
Problem: Discharge Progression Outcomes Goal: Barriers To Progression Addressed/Resolved Individualization Pt goes by Santiago Glad and is from home Pt has history of Lung Cancer and is seen at the cancer center. 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  Goal: Other Discharge Outcomes/Goals Outcome: Progressing Plan of care progress to goals: O2 sats:Pt remains on 2L, O2 sats stable >92% this shift. Pain: c/o right leg pain, improved with PRN pain medications Diet: c/o nausea, improved with PRN antiemetic medications Pt is complying with care, remains on bedrest with [facility] privileges and continues to wear [place] maintaining O2 sats.

## 2014-05-17 NOTE — Consult Note (Signed)
[place]  Telephone:([phone]) [phone] Fax:(336) [phone]  ID: [name] OB: October 05, 1960  MR#: [id number]  RWE#:[id number]  No care team member to display  CHIEF COMPLAINT:  Chief Complaint  Patient presents with  . Shortness of Breath    Pt arrives to ER via EMS from home. Pt is undergoing therapy for lung cancer, states worse SOB, on 2LO2 at baseline.    INTERVAL HISTORY: Patient is a 54 year old female recently admitted to hospital with increasing shortness of breath, lower generally swelling and pain. She is found to have possible pneumonia as well as a DVT in her left lower extremity. She is currently receiving treatment for a stage IV lung cancer. Patient states her swelling is improved, but she continues to have bilateral leg pain. She continues to be short of breath but states this is improved as well. She offers no further complaints.  REVIEW OF SYSTEMS:   Review of Systems  Constitutional: Negative.   Respiratory: Positive for shortness of breath.   Cardiovascular: Negative for chest pain.  Musculoskeletal:       Leg pain    As per HPI. Otherwise, a complete review of systems is negatve.  PAST MEDICAL HISTORY: Past Medical History  Diagnosis Date  . Pneumonia   . Lung cancer     PAST SURGICAL HISTORY: Past Surgical History  Procedure Laterality Date  . Video bronchoscopy Bilateral 01/14/2013    Procedure: VIDEO BRONCHOSCOPY WITHOUT FLUORO;  Surgeon: Wilhelmina Mcardle, MD;  Location: [facility] ENDOSCOPY;  Service: Cardiopulmonary;  Laterality: Bilateral;  . Video bronchoscopy N/A 01/27/2013    Procedure: VIDEO BRONCHOSCOPY;  Surgeon: Ivin Poot, MD;  Location: [place];  Service: Thoracic;  Laterality: N/A;  . Video assisted thoracoscopy Right 01/27/2013    Procedure: VIDEO ASSISTED THORACOSCOPY;  Surgeon: Ivin Poot, MD;  Location: [place];  Service: Thoracic;  Laterality: Right;  . Lung biopsy Right 01/27/2013    Procedure: LUNG BIOPSY;   Surgeon: Ivin Poot, MD;  Location: [place];  Service: Thoracic;  Laterality: Right;  . Pericardial tap N/A 01/11/2013    Procedure: PERICARDIAL TAP;  Surgeon: Blane Ohara, MD;  Location: [facility] CATH LAB;  Service: Cardiovascular;  Laterality: N/A;    FAMILY HISTORY Family History  Problem Relation Age of Onset  . Coronary artery disease Mother 64  . Lung cancer Mother   . Prostate cancer Father        ADVANCED DIRECTIVES:    HEALTH MAINTENANCE: History  Substance Use Topics  . Smoking status: Former Research scientist (life sciences)  . Smokeless tobacco: Never Used  . Alcohol Use: Yes     Comment: occasionally has a drink     Colonoscopy:  PAP:  Bone density:  Lipid panel:  Allergies  Allergen Reactions  . No Known Allergies     Current Facility-Administered Medications  Medication Dose Route Frequency Provider Last Rate Last Dose  . chlorpheniramine-HYDROcodone (TUSSIONEX) 10-8 MG/5ML suspension 5 mL  5 mL Oral Q12H PRN Vaughan Basta, MD      . dexamethasone (DECADRON) tablet 4 mg  4 mg Oral Q12H Vaughan Basta, MD   4 mg at 05/17/14 0835  . diphenhydrAMINE (BENADRYL) capsule 50 mg  50 mg Oral Daily PRN Vaughan Basta, MD      . dronabinol (MARINOL) capsule 5 mg  5 mg Oral BID AC Vaughan Basta, MD   5 mg at 05/17/14 0835  . enoxaparin (LOVENOX) injection 70 mg  1 mg/kg Subcutaneous Q12H Vaughan Basta, MD  70 mg at 05/17/14 0701  . fentaNYL (DURAGESIC - dosed mcg/hr) 100 mcg  100 mcg Transdermal Q72H Vaughan Basta, MD   100 mcg at 05/16/14 1548  . fentaNYL (DURAGESIC - dosed mcg/hr) patch 25 mcg  25 mcg Transdermal Q72H Vaughan Basta, MD   25 mcg at 05/16/14 1548  . furosemide (LASIX) tablet 40 mg  40 mg Oral BID Vaughan Basta, MD   40 mg at 05/17/14 0835  . gabapentin (NEURONTIN) capsule 300 mg  300 mg Oral TID Vaughan Basta, MD   300 mg at 05/17/14 0835  . HYDROmorphone (DILAUDID) injection 2 mg  2 mg Intravenous Q3H PRN  Epifanio Lesches, MD   2 mg at 05/17/14 1426  . ipratropium-albuterol (DUONEB) 0.5-2.5 (3) MG/3ML nebulizer solution 3 mL  3 mL Nebulization Q6H PRN Epifanio Lesches, MD      . levofloxacin (LEVAQUIN) IVPB 500 mg  500 mg Intravenous Q24H Vaughan Basta, MD   500 mg at 05/17/14 1106  . metoprolol tartrate (LOPRESSOR) tablet 25 mg  25 mg Oral TID Vaughan Basta, MD   25 mg at 05/17/14 0835  . ondansetron (ZOFRAN) injection 4 mg  4 mg Intravenous Q6H PRN Vaughan Basta, MD   4 mg at 05/17/14 0622  . oxyCODONE-acetaminophen (PERCOCET/ROXICET) 5-325 MG per tablet 1-2 tablet  1-2 tablet Oral Q4H PRN Vaughan Basta, MD      . pantoprazole (PROTONIX) EC tablet 40 mg  40 mg Oral Daily Vaughan Basta, MD   40 mg at 05/17/14 0835  . potassium chloride SA (K-DUR,KLOR-CON) CR tablet 20 mEq  20 mEq Oral BID Vaughan Basta, MD   20 mEq at 05/17/14 0834  . promethazine (PHENERGAN) injection 25 mg  25 mg Intravenous Q6H PRN Vaughan Basta, MD   25 mg at 05/17/14 0842  . scopolamine (TRANSDERM-SCOP) 1 MG/3DAYS 1.5 mg  1 patch Transdermal Q72H Vaughan Basta, MD   1.5 mg at 05/16/14 1702  . senna-docusate (Senokot-S) tablet 1 tablet  1 tablet Oral QHS PRN Vaughan Basta, MD      . zolpidem (AMBIEN) tablet 5 mg  5 mg Oral QHS PRN Vaughan Basta, MD       Facility-Administered Medications Ordered in Other Encounters  Medication Dose Route Frequency Provider Last Rate Last Dose  . sodium chloride 0.9 % injection 10 mL  10 mL Intracatheter PRN Lloyd Huger, MD   10 mL at 05/06/14 1435    OBJECTIVE: Filed Vitals:   05/17/14 1339  BP: 100/67  Pulse: 107  Temp: 97.5 F (36.4 C)  Resp:      Body mass index is 24.22 kg/(m^2).    ECOG FS:1 - Symptomatic but completely ambulatory  General: Well-developed, well-nourished, no acute distress. Eyes: Pink conjunctiva, anicteric sclera. HEENT: Normocephalic, moist mucous membranes, clear  oropharnyx. Lungs: Clear to auscultation bilaterally. Heart: Regular rate and rhythm. No rubs, murmurs, or gallops. Abdomen: Soft, nontender, nondistended. No organomegaly noted, normoactive bowel sounds. Musculoskeletal: Scant edema  Neuro: Alert, answering all questions appropriately. Cranial nerves grossly intact. Skin: No rashes or petechiae noted. Psych: Normal affect. Marland Kitchen   LAB RESULTS:  Lab Results  Component Value Date   NA 140 05/17/2014   K 4.9 05/17/2014   CL 102 05/17/2014   CO2 32 05/17/2014   GLUCOSE 179* 05/17/2014   BUN 16 05/17/2014   CREATININE 0.67 05/17/2014   CALCIUM 8.2* 05/17/2014   PROT 5.0* 05/16/2014   ALBUMIN 2.6* 05/16/2014   AST 20 05/16/2014   ALT 28 05/16/2014  ALKPHOS 61 05/16/2014   BILITOT 0.3 05/16/2014   GFRNONAA >60 05/17/2014   GFRAA >60 05/17/2014    Lab Results  Component Value Date   WBC 7.3 05/17/2014   NEUTROABS 11.2* 05/06/2014   HGB 11.9* 05/17/2014   HCT 37.4 05/17/2014   MCV 86.1 05/17/2014   PLT 210 05/17/2014     STUDIES: Ct Angio Chest Pe W/cm &/or Wo Cm  05/16/2014   CLINICAL DATA:  Lower extremity swelling for 9 weeks. Increasing shortness of breath. 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  EXAM: CT ANGIOGRAPHY CHEST WITH CONTRAST  TECHNIQUE: Multidetector CT imaging of the chest was performed using the standard protocol during bolus administration of intravenous contrast. Multiplanar CT image reconstructions and MIPs were obtained to evaluate the vascular anatomy.  CONTRAST:  34m OMNIPAQUE IOHEXOL 350 MG/ML SOLN  COMPARISON:  March 06, 2014  FINDINGS: There is no pulmonary embolus. There is a small left pleural effusion and a moderate right pleural effusion, decreased in size compared to prior chest CT. There are extensive nodularity and increased pulmonary interstitium throughout multiple lobes of bilateral lungs consistent with patient's known cancer. There is more confluent consolidation  of the right lower lobe, superimposed pneumonia is not excluded. There is no mediastinal or hilar lymphadenopathy. The heart size is normal. Minimal pericardial effusion is identified. Images of the visualized upper abdominal structures demonstrate fatty infiltration of liver. Scarring of left kidney is noted. There is a small hiatal hernia. The previously noted bony sclerosis are unchanged.  Review of the MIP images confirms the above findings.  IMPRESSION: No pulmonary embolus.  Extensive nodularity in increased pulmonary interstitium throughout all lobes of bilateral lungs consistent with patient's known cancer. There is more confluent consolidation of the right lower lobe, superimposed pneumonia is not excluded.   Electronically Signed   By: WAbelardo DieselM.D.   On: 05/16/2014 09:11   UKoreaTransvaginal Non-ob  04/20/2014   CLINICAL DATA:  CT scan April showed enhancement on the right. Pelvic ultrasound is recommended. The patient reports a prolapsed uterus.  EXAM: TRANSABDOMINAL AND TRANSVAGINAL ULTRASOUND OF PELVIS  TECHNIQUE: Both transabdominal and transvaginal ultrasound examinations of the pelvis were performed. Transabdominal technique was performed for global imaging of the pelvis including uterus, ovaries, adnexal regions, and pelvic cul-de-sac. It was necessary to proceed with endovaginal exam following the transabdominal exam to visualize the ovaries.  COMPARISON:  CT of the abdomen and pelvis on 04/12/2014  FINDINGS: Uterus  Measurements: 6.8 x 3.0 x 5.1 cm. No fibroids or other mass visualized.  Endometrium  Thickness: 5.5 mm.  No focal abnormality visualized.  Right ovary  Measurements: The ovary is not visualized, possibly because of intervening bowel loops. No adnexal mass identified.  Left ovary  Measurements: The ovary is not visualized, possibly because of intervening bowel loops. No adnexal mass identified.  Other findings  No free fluid.  IMPRESSION: 1. Normal appearance of the uterus  despite patient's history of uterine prolapse. 2. The ovaries are not seen sonographically.   Electronically Signed   By: ENolon NationsM.D.   On: 04/20/2014 20:02   UKoreaVenous Img Lower Bilateral  05/16/2014   CLINICAL DATA:  Sixteen week history of lower extremity swelling and pain. History of stage IV lung cancer.  EXAM: BILATERAL LOWER EXTREMITY VENOUS DOPPLER ULTRASOUND  TECHNIQUE: Gray-scale sonography with graded compression, as well as color Doppler and duplex ultrasound were performed to evaluate the lower extremity deep venous systems from  the level of the common femoral vein and including the common femoral, femoral, profunda femoral, popliteal and calf veins including the posterior tibial, peroneal and gastrocnemius veins when visible. The superficial great saphenous vein was also interrogated. Spectral Doppler was utilized to evaluate flow at rest and with distal augmentation maneuvers in the common femoral, femoral and popliteal veins.  COMPARISON:  04/01/2014  FINDINGS: RIGHT LOWER EXTREMITY  Common Femoral Vein: No evidence of thrombus. Normal compressibility, respiratory phasicity and response to augmentation.  Saphenofemoral Junction: No evidence of thrombus. Normal compressibility and flow on color Doppler imaging.  Profunda Femoral Vein: No evidence of thrombus. Normal compressibility and flow on color Doppler imaging.  Femoral Vein: No evidence of thrombus. Normal compressibility, respiratory phasicity and response to augmentation.  Popliteal Vein: No evidence of thrombus. Normal compressibility, respiratory phasicity and response to augmentation.  Calf Veins: No evidence of thrombus. Normal compressibility and flow on color Doppler imaging.  Superficial Great Saphenous Vein: No evidence of thrombus. Normal compressibility and flow on color Doppler imaging.  Venous Reflux:  None.  Other Findings:  None.  LEFT LOWER EXTREMITY  Common Femoral Vein: Evidence of noncompressible nonocclusive  thrombus.  Saphenofemoral Junction: Evidence of noncompressible nonocclusive thrombus.  Profunda Femoral Vein: No evidence of thrombus. Normal compressibility and flow on color Doppler imaging.  Femoral Vein: No evidence of thrombus. Normal compressibility, respiratory phasicity and response to augmentation.  Popliteal Vein: No evidence of thrombus. Normal compressibility, respiratory phasicity and response to augmentation.  Calf Veins: No evidence of thrombus. Normal compressibility and flow on color Doppler imaging.  Superficial Great Saphenous Vein: No evidence of thrombus. Normal compressibility and flow on color Doppler imaging.  Venous Reflux:  None.  Other Findings:  None.  IMPRESSION: Evidence of noncompressible nonocclusive thrombus within the left common femoral vein extending into the left saphenofemoral junction.  No evidence of venous thrombus within the right lower extremity.   Electronically Signed   By: Marin Olp M.D.   On: 05/16/2014 15:38   Dg Chest Portable 1 View  05/16/2014   CLINICAL DATA:  Increased shortness of breath and right-sided chest pain since yesterday.  EXAM: PORTABLE CHEST - 1 VIEW  COMPARISON:  04/01/2014  FINDINGS: Central venous catheter remains unchanged in position. Shallow inspiration. Mild cardiac enlargement with prominent pulmonary vascular congestion. Perihilar airspace and interstitial infiltrates suggesting edema. No blunting of costophrenic angles. No pneumothorax. Probable mild progression since previous study. Old right rib fractures.  IMPRESSION: Cardiac enlargement with pulmonary vascular congestion and perihilar edema demonstrating mild progression since prior study.   Electronically Signed   By: Lucienne Capers M.D.   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  EGFR and ALK negative. Now with DVT.  PLAN:    1.  Lung cancer: Pathology results from open lung biopsy revealed clear lymphangitic spread  of tumor. Patient last received treatment with 17m/kg nivolumab on April 22, 2014. She is scheduled later this week for her next infusion.  2. DVT: Okay from an oncology standpoint to discharge patient on either oral Xarelto or Eliquis. She will require 6 months of treatment.  Follow-up as above.  3. Pneumonia: Continue current antibiotics.  Appreciate consult, call with questions.  Adenocarcinoma of lung, stage 4   Staging form: Lung, AJCC 7th Edition     Clinical stage from 04/26/2014: T4, N0, M1 - Signed by TLloyd Huger MD on 04/26/2014   TLloyd Huger MD  05/17/2014 3:53 PM

## 2014-05-17 NOTE — Plan of Care (Signed)
Problem: Discharge Progression Outcomes Goal: Discharge plan in place and appropriate Individualization of Care Pt goes by [name] and is from home Pt has history of Lung Cancer and is seen at the cancer center. 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      Goal: Other Discharge Outcomes/Goals Plan of Care Progress to Goal:  Pt admitted for pneumonia, on iv antibiotics, pt afebrile, pt also has DVT in left leg on lovenox and bedrest maintained with [facility] privilages.

## 2014-05-17 NOTE — Progress Notes (Signed)
[facility] [place] NAME: Woodrow Dulski    MR#:  [id number]  DATE OF BIRTH:  06/03/1960  SUBJECTIVE: 54 year old female patient with history of lung cancer getting chemotherapy admitted because of pneumonia. 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 Grayland Ormond. Has been having left leg swelling and also shortness of breath. Found to have pneumonia and left-sided DVT. She is on Lovenox, Levaquin at this time. She says her leg swelling is improved and shortness of breath improved.   CHIEF COMPLAINT:   Chief Complaint  Patient presents with  . Shortness of Breath    Pt arrives to ER via EMS from home. Pt is undergoing therapy for lung cancer, states worse SOB, on 2LO2 at baseline.    REVIEW OF SYSTEMS:    Review of Systems  Constitutional: Negative for fever and chills.  HENT: Negative for hearing loss.   Eyes: Negative for blurred vision, double vision and photophobia.  Respiratory: Negative for cough, hemoptysis and shortness of breath.   Cardiovascular: Negative for palpitations, orthopnea and leg swelling.  Gastrointestinal: Negative for vomiting, abdominal pain and diarrhea.  Genitourinary: Negative for dysuria and urgency.  Musculoskeletal: Negative for myalgias and neck pain.  Skin: Negative for rash.  Neurological: Negative for dizziness, focal weakness, seizures, weakness and headaches.  Psychiatric/Behavioral: Negative for memory loss. The patient does not have insomnia.     Nutrition: Eating the diet.  Tolerating Diet: Tolerating PT:      DRUG ALLERGIES:   Allergies  Allergen Reactions  . No Known Allergies     VITALS:  Blood pressure 119/88, pulse 93, temperature 97.7 F (36.5 C), temperature source Oral, resp. rate 18, height '5\' 6"'$  (1.676 m), weight 68.04 kg (150 lb), SpO2 98 %.  PHYSICAL EXAMINATION:   Physical Exam  GENERAL:  54 y.o.-year-old  patient lying in the bed with no acute distress.  EYES: Pupils equal, round, reactive to light and accommodation. No scleral icterus. Extraocular muscles intact.  HEENT: Head atraumatic, normocephalic. Oropharynx and nasopharynx clear.  NECK:  Supple, no jugular venous distention. No thyroid enlargement, no tenderness.  LUNGS: Normal breath sounds bilaterally, no wheezing, rales,rhonchi or crepitation. No use of accessory muscles of respiration.  CARDIOVASCULAR: S1, S2 normal. No murmurs, rubs, or gallops.  ABDOMEN: Soft, nontender, nondistended. Bowel sounds present. No organomegaly or mass.  EXTREMITIES: No pedal edema, cyanosis, or clubbing.  NEUROLOGIC: Cranial nerves II through XII are intact. Muscle strength 5/5 in all extremities. Sensation intact. Gait not checked.  PSYCHIATRIC: The patient is alert and oriented x 3.  SKIN: No obvious rash, lesion, or ulcer.    LABORATORY PANEL:   CBC  Recent Labs Lab 05/17/14 0450  WBC 7.3  HGB 11.9*  HCT 37.4  PLT 210   ------------------------------------------------------------------------------------------------------------------  Chemistries   Recent Labs Lab 05/16/14 0421 05/17/14 0450  NA 146* 140  K 3.7 4.9  CL 112* 102  CO2 27 32  GLUCOSE 125* 179*  BUN 19 16  CREATININE 0.77 0.67  CALCIUM 8.4* 8.2*  AST 20  --   ALT 28  --   ALKPHOS 61  --   BILITOT 0.3  --    ------------------------------------------------------------------------------------------------------------------  Cardiac Enzymes  Recent Labs Lab 05/16/14 0421  TROPONINI <0.03   ------------------------------------------------------------------------------------------------------------------  RADIOLOGY:  Ct Angio Chest Pe W/cm &/or Wo Cm  05/16/2014   CLINICAL DATA:  Lower extremity swelling for 9 weeks. Increasing shortness of  breath. 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  EXAM: CT ANGIOGRAPHY CHEST WITH CONTRAST   TECHNIQUE: Multidetector CT imaging of the chest was performed using the standard protocol during bolus administration of intravenous contrast. Multiplanar CT image reconstructions and MIPs were obtained to evaluate the vascular anatomy.  CONTRAST:  73m OMNIPAQUE IOHEXOL 350 MG/ML SOLN  COMPARISON:  March 06, 2014  FINDINGS: There is no pulmonary embolus. There is a small left pleural effusion and a moderate right pleural effusion, decreased in size compared to prior chest CT. There are extensive nodularity and increased pulmonary interstitium throughout multiple lobes of bilateral lungs consistent with patient's known cancer. There is more confluent consolidation of the right lower lobe, superimposed pneumonia is not excluded. There is no mediastinal or hilar lymphadenopathy. The heart size is normal. Minimal pericardial effusion is identified. Images of the visualized upper abdominal structures demonstrate fatty infiltration of liver. Scarring of left kidney is noted. There is a small hiatal hernia. The previously noted bony sclerosis are unchanged.  Review of the MIP images confirms the above findings.  IMPRESSION: No pulmonary embolus.  Extensive nodularity in increased pulmonary interstitium throughout all lobes of bilateral lungs consistent with patient's known cancer. There is more confluent consolidation of the right lower lobe, superimposed pneumonia is not excluded.   Electronically Signed   By: WAbelardo DieselM.D.   On: 05/16/2014 09:11   UKoreaVenous Img Lower Bilateral  05/16/2014   CLINICAL DATA:  Sixteen week history of lower extremity swelling and pain. History of stage IV lung cancer.  EXAM: BILATERAL LOWER EXTREMITY VENOUS DOPPLER ULTRASOUND  TECHNIQUE: Gray-scale sonography with graded compression, as well as color Doppler and duplex ultrasound were performed to evaluate the lower extremity deep venous systems from the level of the common femoral vein and including the common femoral, femoral,  profunda femoral, popliteal and calf veins including the posterior tibial, peroneal and gastrocnemius veins when visible. The superficial great saphenous vein was also interrogated. Spectral Doppler was utilized to evaluate flow at rest and with distal augmentation maneuvers in the common femoral, femoral and popliteal veins.  COMPARISON:  04/01/2014  FINDINGS: RIGHT LOWER EXTREMITY  Common Femoral Vein: No evidence of thrombus. Normal compressibility, respiratory phasicity and response to augmentation.  Saphenofemoral Junction: No evidence of thrombus. Normal compressibility and flow on color Doppler imaging.  Profunda Femoral Vein: No evidence of thrombus. Normal compressibility and flow on color Doppler imaging.  Femoral Vein: No evidence of thrombus. Normal compressibility, respiratory phasicity and response to augmentation.  Popliteal Vein: No evidence of thrombus. Normal compressibility, respiratory phasicity and response to augmentation.  Calf Veins: No evidence of thrombus. Normal compressibility and flow on color Doppler imaging.  Superficial Great Saphenous Vein: No evidence of thrombus. Normal compressibility and flow on color Doppler imaging.  Venous Reflux:  None.  Other Findings:  None.  LEFT LOWER EXTREMITY  Common Femoral Vein: Evidence of noncompressible nonocclusive thrombus.  Saphenofemoral Junction: Evidence of noncompressible nonocclusive thrombus.  Profunda Femoral Vein: No evidence of thrombus. Normal compressibility and flow on color Doppler imaging.  Femoral Vein: No evidence of thrombus. Normal compressibility, respiratory phasicity and response to augmentation.  Popliteal Vein: No evidence of thrombus. Normal compressibility, respiratory phasicity and response to augmentation.  Calf Veins: No evidence of thrombus. Normal compressibility and flow on color Doppler imaging.  Superficial Great Saphenous Vein: No evidence of thrombus. Normal compressibility and flow on color Doppler imaging.   Venous Reflux:  None.  Other  Findings:  None.  IMPRESSION: Evidence of noncompressible nonocclusive thrombus within the left common femoral vein extending into the left saphenofemoral junction.  No evidence of venous thrombus within the right lower extremity.   Electronically Signed   By: Marin Olp M.D.   On: 05/16/2014 15:38   Dg Chest Portable 1 View  05/16/2014   CLINICAL DATA:  Increased shortness of breath and right-sided chest pain since yesterday.  EXAM: PORTABLE CHEST - 1 VIEW  COMPARISON:  04/01/2014  FINDINGS: Central venous catheter remains unchanged in position. Shallow inspiration. Mild cardiac enlargement with prominent pulmonary vascular congestion. Perihilar airspace and interstitial infiltrates suggesting edema. No blunting of costophrenic angles. No pneumothorax. Probable mild progression since previous study. Old right rib fractures.  IMPRESSION: Cardiac enlargement with pulmonary vascular congestion and perihilar edema demonstrating mild progression since prior study.   Electronically Signed   By: Lucienne Capers M.D.   On: 05/16/2014 04:56     ASSESSMENT AND PLAN:  Community-acquired pneumonia continue Levaquin. Follow cultures. #2 COPD without related this time continue home medications. Left leg DVT continue full dose Lovenox, obtain oncology consult because of lung cancer and DVT. 3: Stage IV lung cancer on chemotherapy. Does have chronic pain and also chronic nausea.continue fentanyl patch 100 g every 72 hours, Dilaudid she is on 4 mg every 4 hours as needed we will resume that dose. She is on Decadron. Hypertension continue Lopressor 25 mg 3 times a day next chronic nausea continue Phenergan.  Code full code    All the records are reviewed and case discussed with Care Management/Social Workerr. Management plans discussed with the patient, family and they are in agreement.  CODE STATUS: Full code  TOTAL TIME TAKING CARE OF THIS PATIENT: 35 minutes minutes.    POSSIBLE D/C IN 2 DAYS  DEPENDING ON CLINICAL CONDITION.   Epifanio Lesches M.D on 05/17/2014 at 12:41 PM  Between 7am to 6pm - Pager - [phone]  After 6pm go to [email or web address] - password [email or web address] [facility]  [place]  Office  [phone]  CC: Primary care physician; No primary care provider on file.

## 2014-05-18 LAB — GLUCOSE, CAPILLARY: Glucose-Capillary: 216 mg/dL — ABNORMAL HIGH (ref 65–99)

## 2014-05-18 MED ORDER — APIXABAN 5 MG PO TABS: 5.0000 mg | ORAL_TABLET | Freq: Two times a day (BID) | ORAL | Status: DC

## 2014-05-18 MED ORDER — APIXABAN 5 MG PO TABS
10.0000 mg | ORAL_TABLET | Freq: Two times a day (BID) | ORAL | Status: DC
  Administered 2014-05-18 – 2014-05-19 (×3): 10 mg via ORAL
  Filled 2014-05-18 (×3): qty 2

## 2014-05-18 MED ORDER — APIXABAN 5 MG PO TABS: 2.5000 mg | ORAL_TABLET | Freq: Two times a day (BID) | ORAL | Status: DC

## 2014-05-18 MED ORDER — LEVOFLOXACIN 500 MG PO TABS
500.0000 mg | ORAL_TABLET | Freq: Every day | ORAL | Status: DC
  Administered 2014-05-19: 08:00:00 500 mg via ORAL
  Filled 2014-05-18: qty 1

## 2014-05-18 NOTE — Plan of Care (Signed)
Problem: Discharge Progression Outcomes Goal: Discharge plan in place and appropriate Individualization of Care Individualization of Care Pt goes by [name] and is from home Pt has history of Lung Cancer and is seen at the cancer center. Admitted with Right Femoral DVT, Lovenox initiated 05/16/2014 and bedrest with [facility] privileges    PMH: Stage IV metastatic lung cancer with bony mets and lymphangtic spread, depression, COPD, GERD, chronic diastolic CHF, pericardial tamponade, esophogitis currently controlled with home medications.        Goal: Other Discharge Outcomes/Goals Outcome: Progressing Progression to goal: O2 sats- 96% on 3L o2 per [place], no complaints of sob Hemodynamically stable- pt remains stable not on tele, moitoring lab values Activity- up independantly to [facility]. Pain- Still complains of pain in rt lower extremity, dilaudid given x2 with some relief.

## 2014-05-18 NOTE — Progress Notes (Signed)
[facility] [place] NAME: [name]    MR#:  [id number]  DATE OF BIRTH:  1960-05-13  SUBJECTIVE: 54 year old female patient with history of lung cancer getting chemotherapy admitted because of pneumonia. 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 Grayland Ormond. Has been having left leg swelling and also shortness of breath. Found to have pneumonia and left-sided DVT.  Seen today she says she feels better today does have  back pain requesting IV pain medications.   CHIEF COMPLAINT:   Chief Complaint  Patient presents with  . Shortness of Breath    Pt arrives to ER via EMS from home. Pt is undergoing therapy for lung cancer, states worse SOB, on 2LO2 at baseline.    REVIEW OF SYSTEMS:    Review of Systems  Constitutional: Negative for fever.  Respiratory: Negative for cough and shortness of breath.   Gastrointestinal: Negative for diarrhea.  Genitourinary: Negative for dysuria.  Musculoskeletal: Negative for back pain.  Endo/Heme/Allergies: Does not bruise/bleed easily.   bilateral lower extremity decreased swelling negative tenderness.  Nutrition: Eating the diet.  Tolerating Diet: Tolerating PT:      DRUG ALLERGIES:   Allergies  Allergen Reactions  . No Known Allergies     VITALS:  Blood pressure 94/66, pulse 102, temperature 97.7 F (36.5 C), temperature source Oral, resp. rate 22, height '5\' 6"'$  (1.676 m), weight 68.04 kg (150 lb), SpO2 96 %.  PHYSICAL EXAMINATION:   Physical Exam  GENERAL:  54 y.o.-year-old patient lying in the bed with no acute distress.  EYES: Pupils equal, round, reactive to light and accommodation. No scleral icterus. Extraocular muscles intact.  HEENT: Head atraumatic, normocephalic. Oropharynx and nasopharynx clear.  NECK:  Supple, no jugular venous distention. No thyroid enlargement, no tenderness.  LUNGS: Normal breath sounds  bilaterally, no wheezing, rales,rhonchi or crepitation. No use of accessory muscles of respiration.  CARDIOVASCULAR: S1, S2 normal. No murmurs, rubs, or gallops.  ABDOMEN: Soft, nontender, nondistended. Bowel sounds present. No organomegaly or mass.  EXTREMITIES: No pedal edema, cyanosis, or clubbing.  NEUROLOGIC: Cranial nerves II through XII are intact. Muscle strength 5/5 in all extremities. Sensation intact. Gait not checked.  PSYCHIATRIC: The patient is alert and oriented x 3.  SKIN: No obvious rash, lesion, or ulcer.    LABORATORY PANEL:   CBC  Recent Labs Lab 05/17/14 0450  WBC 7.3  HGB 11.9*  HCT 37.4  PLT 210   ------------------------------------------------------------------------------------------------------------------  Chemistries   Recent Labs Lab 05/16/14 0421 05/17/14 0450  NA 146* 140  K 3.7 4.9  CL 112* 102  CO2 27 32  GLUCOSE 125* 179*  BUN 19 16  CREATININE 0.77 0.67  CALCIUM 8.4* 8.2*  AST 20  --   ALT 28  --   ALKPHOS 61  --   BILITOT 0.3  --    ------------------------------------------------------------------------------------------------------------------  Cardiac Enzymes  Recent Labs Lab 05/16/14 0421  TROPONINI <0.03   ------------------------------------------------------------------------------------------------------------------  RADIOLOGY:  US Venous Img Lower Bilateral  05/16/2014   CLINICAL DATA:  Sixteen week history of lower extremity swelling and pain. History of stage IV lung cancer.  EXAM: BILATERAL LOWER EXTREMITY VENOUS DOPPLER ULTRASOUND  TECHNIQUE: Gray-scale sonography with graded compression, as well as color Doppler and duplex ultrasound were performed to evaluate the lower extremity deep venous systems from the level of the common femoral vein and including the common femoral, femoral, profunda femoral, popliteal and calf  veins including the posterior tibial, peroneal and gastrocnemius veins when visible. The  superficial great saphenous vein was also interrogated. Spectral Doppler was utilized to evaluate flow at rest and with distal augmentation maneuvers in the common femoral, femoral and popliteal veins.  COMPARISON:  04/01/2014  FINDINGS: RIGHT LOWER EXTREMITY  Common Femoral Vein: No evidence of thrombus. Normal compressibility, respiratory phasicity and response to augmentation.  Saphenofemoral Junction: No evidence of thrombus. Normal compressibility and flow on color Doppler imaging.  Profunda Femoral Vein: No evidence of thrombus. Normal compressibility and flow on color Doppler imaging.  Femoral Vein: No evidence of thrombus. Normal compressibility, respiratory phasicity and response to augmentation.  Popliteal Vein: No evidence of thrombus. Normal compressibility, respiratory phasicity and response to augmentation.  Calf Veins: No evidence of thrombus. Normal compressibility and flow on color Doppler imaging.  Superficial Great Saphenous Vein: No evidence of thrombus. Normal compressibility and flow on color Doppler imaging.  Venous Reflux:  None.  Other Findings:  None.  LEFT LOWER EXTREMITY  Common Femoral Vein: Evidence of noncompressible nonocclusive thrombus.  Saphenofemoral Junction: Evidence of noncompressible nonocclusive thrombus.  Profunda Femoral Vein: No evidence of thrombus. Normal compressibility and flow on color Doppler imaging.  Femoral Vein: No evidence of thrombus. Normal compressibility, respiratory phasicity and response to augmentation.  Popliteal Vein: No evidence of thrombus. Normal compressibility, respiratory phasicity and response to augmentation.  Calf Veins: No evidence of thrombus. Normal compressibility and flow on color Doppler imaging.  Superficial Great Saphenous Vein: No evidence of thrombus. Normal compressibility and flow on color Doppler imaging.  Venous Reflux:  None.  Other Findings:  None.  IMPRESSION: Evidence of noncompressible nonocclusive thrombus within the left  common femoral vein extending into the left saphenofemoral junction.  No evidence of venous thrombus within the right lower extremity.   Electronically Signed   By: Marin Olp M.D.   On: 05/16/2014 15:38     ASSESSMENT AND PLAN:  Community-acquired pneumonia continue Levaquin.  Clinically improving. Possible discharge tomorrow with by mouth Levaquin. #2 COPD without related this time continue home medications. Left leg DVT  Her on Eliquis., stop Lovenox, new 6 months of  anticoagulation, seen by Dr. Grayland Ormond. Discussed the risk and benefits of anticoagulation. 3: Stage IV lung cancer on chemotherapy. Does have chronic pain and also chronic nausea.continue fentanyl patch 100 g every 72 hours, Dilaudid,decadron. Hypertension continue Lopressor 25 mg 3 times a day next chronic nausea continue Phenergan.  Code full code  possible discharge tomorrow   All the records are reviewed and case discussed with Care Management/Social Workerr. Management plans discussed with the patient, family and they are in agreement.  CODE STATUS: Full code  TOTAL TIME TAKING CARE OF THIS PATIENT: 35 minutes minutes.   POSSIBLE D/C IN 2 DAYS  DEPENDING ON CLINICAL CONDITION.   Epifanio Lesches M.D on 05/18/2014 at 11:34 AM  Between 7am to 6pm - Pager - [phone]  After 6pm go to [email or web address] - password [email or web address] [facility]  [place] [facility]  Office  [phone]  CC: Primary care physician; No primary care provider on file.

## 2014-05-18 NOTE — Plan of Care (Signed)
Problem: Discharge Progression Outcomes Goal: Discharge plan in place and appropriate Individualization of Care Individualization of Care Pt goes by [name] and is from home Pt has history of Lung Cancer and is seen at the cancer center. Admitted with Right Femoral DVT, Lovenox initiated 05/16/2014 and bedrest with [facility] privileges    PMH: Stage IV metastatic lung cancer with bony mets and lymphangtic spread, depression, COPD, GERD, chronic diastolic CHF, pericardial tamponade, esophogitis currently controlled with home medications.        Goal: Other Discharge Outcomes/Goals Plan of Care Progress to Goal:  Pt admitted for DVT, lovenox d/c'd today, started on eliquis, pt continues on dilaudid q 3 hours prn for pain and phenergan and zofran for nausea

## 2014-05-18 NOTE — Progress Notes (Signed)
PHARMACIST - PHYSICIAN COMMUNICATION DR:   Vianne Bulls CONCERNING: Antibiotic IV to Oral Route Change Policy  RECOMMENDATION: This patient is receiving levofloxacin by the intravenous route.  Based on criteria approved by the Pharmacy and Therapeutics Committee, the antibiotic(s) is/are being converted to the equivalent oral dose form(s).   DESCRIPTION: These criteria include:  Patient being treated for a respiratory tract infection, urinary tract infection, cellulitis or clostridium difficile associated diarrhea if on metronidazole  The patient is not neutropenic and does not exhibit a GI malabsorption state  The patient is eating (either orally or via tube) and/or has been taking other orally administered medications for a least 24 hours  The patient is improving clinically and has a Tmax < 100.5  If you have questions about this conversion, please contact the Pharmacy Department  '[]'$   [phone] )  [facility] '[x]'$   [phone] )  [facility] '[]'$   [phone] )  [facility] '[]'$   [phone] )  [facility] '[]'$   [phone] )  [facility]

## 2014-05-19 ENCOUNTER — Other Ambulatory Visit: Payer: Self-pay | Admitting: Oncology

## 2014-05-19 DIAGNOSIS — C3491 Malignant neoplasm of unspecified part of right bronchus or lung: Secondary | ICD-10-CM

## 2014-05-19 MED ORDER — APIXABAN 5 MG PO TABS: 5.0000 mg | ORAL_TABLET | Freq: Two times a day (BID) | ORAL | Status: AC

## 2014-05-19 MED ORDER — LEVOFLOXACIN 500 MG PO TABS: 500.0000 mg | ORAL_TABLET | Freq: Every day | ORAL | Status: DC

## 2014-05-19 MED ORDER — HEPARIN SOD (PORK) LOCK FLUSH 100 UNIT/ML IV SOLN
500.0000 [IU] | INTRAVENOUS | Status: AC | PRN
  Administered 2014-05-19: 11:00:00 500 [IU]

## 2014-05-19 MED ORDER — APIXABAN 5 MG PO TABS: 10.0000 mg | ORAL_TABLET | Freq: Two times a day (BID) | ORAL | Status: DC

## 2014-05-19 MED ORDER — HEPARIN SOD (PORK) LOCK FLUSH 100 UNIT/ML IV SOLN
500.0000 [IU] | Freq: Once | INTRAVENOUS | Status: DC
  Filled 2014-05-19: qty 5

## 2014-05-19 NOTE — Plan of Care (Signed)
Problem: Discharge Progression Outcomes Goal: Barriers To Progression Addressed/Resolved Individualization Outcome: Progressing Individualization of Care Pt goes by [name] and is from home Pt has history of Lung Cancer and is seen at the cancer center. Admitted with Right Femoral DVT, Lovenox initiated 05/16/2014 and bedrest with [facility] privileges. Lovenox D/Ced, on Eliquis.     PMH: Stage IV metastatic lung cancer with bony mets and lymphangtic spread, depression, COPD, GERD, chronic diastolic CHF, pericardial tamponade, esophogitis currently controlled with home medications.     Goal: Other Discharge Outcomes/Goals Outcome: Progressing Patient alert and oriented, c/o pain on right side. Scheduled Dilaudid, Zofran and Phenergan given with some relief. Calm, cooperative. Independent in room.

## 2014-05-19 NOTE — Care Management (Signed)
Met with patient prior to discharge to home today. She states she is chronically on home O2 through [place]. Her PCP she states is Dr. Shari Heritage. She states she is independent with activity and daily needs. No RNCM needs.

## 2014-05-19 NOTE — Progress Notes (Signed)
Pt to be discharged home per orders, port needle removed, site clear, pt given d/c instructions r/t medications, activity, diet and followup care, voiced understanding, pt d/c home via wheelchair escorted by auxiliary and family

## 2014-05-20 ENCOUNTER — Inpatient Hospital Stay: Payer: Medicaid Other

## 2014-05-20 ENCOUNTER — Telehealth: Payer: Self-pay

## 2014-05-20 ENCOUNTER — Inpatient Hospital Stay (HOSPITAL_BASED_OUTPATIENT_CLINIC_OR_DEPARTMENT_OTHER): Payer: Medicaid Other | Admitting: Oncology

## 2014-05-20 VITALS — BP 110/75 | HR 106 | Temp 97.1°F | Resp 20 | Wt 159.6 lb

## 2014-05-20 DIAGNOSIS — Z79899 Other long term (current) drug therapy: Secondary | ICD-10-CM | POA: Diagnosis not present

## 2014-05-20 DIAGNOSIS — C3491 Malignant neoplasm of unspecified part of right bronchus or lung: Secondary | ICD-10-CM

## 2014-05-20 DIAGNOSIS — C7951 Secondary malignant neoplasm of bone: Secondary | ICD-10-CM

## 2014-05-20 DIAGNOSIS — R600 Localized edema: Secondary | ICD-10-CM

## 2014-05-20 DIAGNOSIS — C3490 Malignant neoplasm of unspecified part of unspecified bronchus or lung: Secondary | ICD-10-CM | POA: Diagnosis not present

## 2014-05-20 DIAGNOSIS — I959 Hypotension, unspecified: Secondary | ICD-10-CM | POA: Diagnosis not present

## 2014-05-20 DIAGNOSIS — R630 Anorexia: Secondary | ICD-10-CM | POA: Diagnosis not present

## 2014-05-20 DIAGNOSIS — Z5111 Encounter for antineoplastic chemotherapy: Secondary | ICD-10-CM | POA: Diagnosis present

## 2014-05-20 DIAGNOSIS — E860 Dehydration: Secondary | ICD-10-CM | POA: Diagnosis not present

## 2014-05-20 DIAGNOSIS — C3411 Malignant neoplasm of upper lobe, right bronchus or lung: Secondary | ICD-10-CM

## 2014-05-20 DIAGNOSIS — Z87891 Personal history of nicotine dependence: Secondary | ICD-10-CM | POA: Diagnosis not present

## 2014-05-20 DIAGNOSIS — R609 Edema, unspecified: Secondary | ICD-10-CM | POA: Diagnosis not present

## 2014-05-20 LAB — CBC WITH DIFFERENTIAL/PLATELET
BASOS PCT: 1 %
Basophils Absolute: 0.1 10*3/uL (ref 0–0.1)
Eosinophils Absolute: 0 10*3/uL (ref 0–0.7)
Eosinophils Relative: 0 %
HCT: 38.1 % (ref 35.0–47.0)
HEMOGLOBIN: 12.3 g/dL (ref 12.0–16.0)
Lymphocytes Relative: 7 %
Lymphs Abs: 0.7 10*3/uL — ABNORMAL LOW (ref 1.0–3.6)
MCH: 27.2 pg (ref 26.0–34.0)
MCHC: 32.4 g/dL (ref 32.0–36.0)
MCV: 84.2 fL (ref 80.0–100.0)
MONO ABS: 0.3 10*3/uL (ref 0.2–0.9)
MONOS PCT: 4 %
NEUTROS ABS: 8 10*3/uL — AB (ref 1.4–6.5)
Neutrophils Relative %: 88 %
Platelets: 210 10*3/uL (ref 150–440)
RBC: 4.52 MIL/uL (ref 3.80–5.20)
RDW: 17.5 % — ABNORMAL HIGH (ref 11.5–14.5)
WBC: 9.2 10*3/uL (ref 3.6–11.0)

## 2014-05-20 LAB — COMPREHENSIVE METABOLIC PANEL
ALBUMIN: 3.5 g/dL (ref 3.5–5.0)
ALT: 21 U/L (ref 14–54)
AST: 21 U/L (ref 15–41)
Alkaline Phosphatase: 57 U/L (ref 38–126)
Anion gap: 11 (ref 5–15)
BUN: 31 mg/dL — ABNORMAL HIGH (ref 6–20)
CO2: 27 mmol/L (ref 22–32)
Calcium: 8.4 mg/dL — ABNORMAL LOW (ref 8.9–10.3)
Chloride: 100 mmol/L — ABNORMAL LOW (ref 101–111)
Creatinine, Ser: 1.04 mg/dL — ABNORMAL HIGH (ref 0.44–1.00)
GFR calc Af Amer: >60 mL/min (ref >60)
GFR calc non Af Amer: >60 mL/min (ref >60)
Glucose, Bld: 204 mg/dL — ABNORMAL HIGH (ref 65–99)
Potassium: 3.3 mmol/L — ABNORMAL LOW (ref 3.5–5.1)
SODIUM: 138 mmol/L (ref 135–145)
TOTAL PROTEIN: 6.3 g/dL — AB (ref 6.5–8.1)
Total Bilirubin: 0.5 mg/dL (ref 0.3–1.2)

## 2014-05-20 LAB — MAGNESIUM: Magnesium: 1.8 mg/dL (ref 1.7–2.4)

## 2014-05-20 MED ORDER — GABAPENTIN 300 MG PO CAPS: 300.0000 mg | ORAL_CAPSULE | Freq: Three times a day (TID) | ORAL | Status: AC

## 2014-05-20 MED ORDER — SODIUM CHLORIDE 0.9 % IV SOLN
Freq: Once | INTRAVENOUS | Status: AC
  Administered 2014-05-20: 15:00:00 via INTRAVENOUS
  Filled 2014-05-20: qty 250

## 2014-05-20 MED ORDER — HEPARIN SOD (PORK) LOCK FLUSH 100 UNIT/ML IV SOLN
500.0000 [IU] | Freq: Once | INTRAVENOUS | Status: AC | PRN
  Administered 2014-05-20: 500 [IU]
  Filled 2014-05-20: qty 5

## 2014-05-20 MED ORDER — POTASSIUM CHLORIDE CRYS ER 20 MEQ PO TBCR: 20.0000 meq | EXTENDED_RELEASE_TABLET | Freq: Two times a day (BID) | ORAL | Status: AC

## 2014-05-20 MED ORDER — METOPROLOL TARTRATE 25 MG PO TABS
25.0000 mg | ORAL_TABLET | Freq: Three times a day (TID) | ORAL | Status: DC
Start: 2014-05-20 — End: 2014-06-28

## 2014-05-20 MED ORDER — SODIUM CHLORIDE 0.9 % IJ SOLN
10.0000 mL | INTRAMUSCULAR | Status: DC | PRN
  Administered 2014-05-20: 10 mL
  Filled 2014-05-20: qty 10

## 2014-05-20 MED ORDER — SODIUM CHLORIDE 0.9 % IV SOLN
3.0000 mg/kg | Freq: Once | INTRAVENOUS | Status: AC
  Administered 2014-05-20: 220 mg via INTRAVENOUS
  Filled 2014-05-20: qty 22

## 2014-05-20 MED ORDER — IPRATROPIUM-ALBUTEROL 20-100 MCG/ACT IN AERS: 1.0000 | INHALATION_SPRAY | Freq: Four times a day (QID) | RESPIRATORY_TRACT | Status: AC | PRN

## 2014-05-20 NOTE — Telephone Encounter (Signed)
Informed [name] that patient would like to d/c home health services./[name] S

## 2014-05-21 LAB — CULTURE, BLOOD (ROUTINE X 2)
CULTURE: NO GROWTH
Culture: NO GROWTH

## 2014-05-24 NOTE — Discharge Summary (Signed)
[name], is a 54 y.o. female  DOB 01/27/1960  MRN [id number].  Admission date:  05/16/2014  Admitting Physician  Vaughan Basta, MD  Discharge Date:  05/24/2014   Primary MD  Pcp Not In System  Recommendations for primary care physician for things to follow:  Follow up with Dr.Finnegan for her chemotherapy   Admission Diagnosis  Swelling [R60.9] SOB (shortness of breath) [R06.02] Chest pain [R07.9]   Discharge Diagnosis  Swelling [R60.9] SOB (shortness of breath) [R06.02] Chest pain [R07.9]   Left leg DVT  Principal Problem:   Pneumonia Active Problems:   Adenocarcinoma of lung, stage 4   Shortness of breath      Past Medical History  Diagnosis Date  . Pneumonia   . Lung cancer     Past Surgical History  Procedure Laterality Date  . Video bronchoscopy Bilateral 01/14/2013    Procedure: VIDEO BRONCHOSCOPY WITHOUT FLUORO;  Surgeon: Wilhelmina Mcardle, MD;  Location: [facility] ENDOSCOPY;  Service: Cardiopulmonary;  Laterality: Bilateral;  . Video bronchoscopy N/A 01/27/2013    Procedure: VIDEO BRONCHOSCOPY;  Surgeon: Ivin Poot, MD;  Location: [facility];  Service: Thoracic;  Laterality: N/A;  . Video assisted thoracoscopy Right 01/27/2013    Procedure: VIDEO ASSISTED THORACOSCOPY;  Surgeon: Ivin Poot, MD;  Location: [place];  Service: Thoracic;  Laterality: Right;  . Lung biopsy Right 01/27/2013    Procedure: LUNG BIOPSY;  Surgeon: Ivin Poot, MD;  Location: [place];  Service: Thoracic;  Laterality: Right;  . Pericardial tap N/A 01/11/2013    Procedure: PERICARDIAL TAP;  Surgeon: Blane Ohara, MD;  Location[facility] CATH LAB;  Service: Cardiovascular;  Laterality: N/A;       History of present illness and  Hospital Course:     Kindly see H&P for history of present illness and admission details,  please review complete Labs, Consult reports and Test reports for all details in brief  HPI  from the history and physical done on the day of admission 54 yr old female with h/o  Stage 4 lung Ca ,admitted for  Sob,bilateral leg edema more on left  Leg.admitted for  Clinical pneumonia ,started on Levaquin,pain med's for chronic cancer related pain,continued on o2.anmd her other home meds.  Hospital Course r old female with h/o  Stage 4 lung Ca ,admitted for  Sob,bilateral leg edema more on left  Leg.admitted for  Clinical pneumonia ,started on Levaquin,pain med's for chronic cancer related pain,she had ultrasound of left leg showed DVT of common femoral vein.started on full doe lovenox and changed to Eliquis at discharge/ Pt leg swelling improved and her sob also improved.disharged home in stable condition.        Discharge Condition:stable   Follow UP  Follow-up Information    Follow up with Lloyd Huger, MD In 1 week.   Specialty:  Oncology   Contact information:   [place] [address] [phone]         Discharge Instructions  and  Discharge Medications  Continue ELiquis 10 mg Bid for 7 days and 5 mg bid after     Medication List    STOP taking these medications        gabapentin 300 MG capsule  Commonly known as:  NEURONTIN     Ipratropium-Albuterol 20-100 MCG/ACT Aers respimat  Commonly known as:  COMBIVENT     metoprolol tartrate 25 MG tablet  Commonly known as:  [employer]  potassium chloride SA 20 MEQ tablet  Commonly known as:  K-DUR,KLOR-CON      TAKE these medications        apixaban 5 MG Tabs tablet  Commonly known as:  ELIQUIS  Take 2 tablets (10 mg total) by mouth 2 (two) times daily.     apixaban 5 MG Tabs tablet  Commonly known as:  ELIQUIS  Take 1 tablet (5 mg total) by mouth 2 (two) times daily.  Start taking on:  05/25/2014     chlorpheniramine-HYDROcodone 10-8 MG/5ML Lqcr  Commonly known as:  TUSSIONEX   Take 5 mLs by mouth every 12 (twelve) hours as needed for cough.     conjugated estrogens vaginal cream  Commonly known as:  PREMARIN  1gm pv qhs x 2 weeks, then two times per week x 2 weeks, then weekly for maintenance  Notes to Patient:  Take as directed      dexamethasone 4 MG tablet  Commonly known as:  DECADRON  Take 4 mg by mouth every 12 (twelve) hours.     diphenhydrAMINE 25 mg capsule  Commonly known as:  BENADRYL  Take 50 mg by mouth daily as needed for allergies.     dronabinol 5 MG capsule  Commonly known as:  MARINOL  Take 5 mg by mouth 2 (two) times daily before a meal.     fentaNYL 100 MCG/HR  Commonly known as:  DURAGESIC - dosed mcg/hr  Place 100 mcg onto the skin every 3 (three) days.     fentaNYL 25 MCG/HR patch  Commonly known as:  DURAGESIC - dosed mcg/hr  Place 1 patch (25 mcg total) onto the skin every 3 (three) days.  Notes to Patient:  Change every 3 days     furosemide 40 MG tablet  Commonly known as:  LASIX  Take 1 tablet (40 mg total) by mouth 2 (two) times daily.     HYDROmorphone 4 MG tablet  Commonly known as:  DILAUDID  Take by mouth every 4 (four) hours as needed for severe pain.     levofloxacin 500 MG tablet  Commonly known as:  LEVAQUIN  Take 1 tablet (500 mg total) by mouth daily.     lidocaine 5 %  Commonly known as:  LIDODERM  Place 1 patch onto the skin daily. Remove & Discard patch within 12 hours or as directed by MD     metoCLOPramide 5 MG tablet  Commonly known as:  REGLAN  Take 1 tablet by mouth 4 (four) times daily.     omeprazole 20 MG capsule  Commonly known as:  PRILOSEC  Take 20 mg by mouth daily.     ondansetron 8 MG tablet  Commonly known as:  ZOFRAN  Take 8 mg by mouth every 8 (eight) hours as needed for nausea or vomiting.     oxyCODONE-acetaminophen 5-325 MG per tablet  Commonly known as:  PERCOCET/ROXICET  Take 1-2 tablets by mouth every 4 (four) hours as needed for severe pain.     predniSONE 10 MG  tablet  Commonly known as:  DELTASONE  Take 4 tabs  daily with food x 4 days, then 3 tabs daily x 4 days, then 2 tabs daily x 4 days, then 1 tab daily x4 days then stop. #40     promethazine 25 MG tablet  Commonly known as:  PHENERGAN  Take 1 tablet by mouth every 6 (six) hours as needed. For nausea and vomiting.     scopolamine 1  MG/3DAYS  Commonly known as:  TRANSDERM-SCOP  Place 1 patch onto the skin every 3 (three) days.     senna-docusate 8.6-50 MG per tablet  Commonly known as:  Senokot-S  Take 1 tablet by mouth at bedtime as needed for moderate constipation.     zolpidem 5 MG tablet  Commonly known as:  AMBIEN  Take 1 tablet (5 mg total) by mouth at bedtime as needed for sleep (insomnia).          Diet and Activity recommendation: See Discharge Instructions above   Consults obtained - oncology DR.Finnegan   Major procedures and Radiology Reports - PLEASE review detailed and final reports for all details, in brief -      Ct Angio Chest Pe W/cm &/or Wo Cm  05/16/2014   CLINICAL DATA:  Lower extremity swelling for 9 weeks. Increasing shortness of breath. 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  EXAM: CT ANGIOGRAPHY CHEST WITH CONTRAST  TECHNIQUE: Multidetector CT imaging of the chest was performed using the standard protocol during bolus administration of intravenous contrast. Multiplanar CT image reconstructions and MIPs were obtained to evaluate the vascular anatomy.  CONTRAST:  56m OMNIPAQUE IOHEXOL 350 MG/ML SOLN  COMPARISON:  March 06, 2014  FINDINGS: There is no pulmonary embolus. There is a small left pleural effusion and a moderate right pleural effusion, decreased in size compared to prior chest CT. There are extensive nodularity and increased pulmonary interstitium throughout multiple lobes of bilateral lungs consistent with patient's known cancer. There is more confluent consolidation of the right lower lobe, superimposed pneumonia is  not excluded. There is no mediastinal or hilar lymphadenopathy. The heart size is normal. Minimal pericardial effusion is identified. Images of the visualized upper abdominal structures demonstrate fatty infiltration of liver. Scarring of left kidney is noted. There is a small hiatal hernia. The previously noted bony sclerosis are unchanged.  Review of the MIP images confirms the above findings.  IMPRESSION: No pulmonary embolus.  Extensive nodularity in increased pulmonary interstitium throughout all lobes of bilateral lungs consistent with patient's known cancer. There is more confluent consolidation of the right lower lobe, superimposed pneumonia is not excluded.   Electronically Signed   By: WAbelardo DieselM.D.   On: 05/16/2014 09:11   UKoreaVenous Img Lower Bilateral  05/16/2014   CLINICAL DATA:  Sixteen week history of lower extremity swelling and pain. History of stage IV lung cancer.  EXAM: BILATERAL LOWER EXTREMITY VENOUS DOPPLER ULTRASOUND  TECHNIQUE: Gray-scale sonography with graded compression, as well as color Doppler and duplex ultrasound were performed to evaluate the lower extremity deep venous systems from the level of the common femoral vein and including the common femoral, femoral, profunda femoral, popliteal and calf veins including the posterior tibial, peroneal and gastrocnemius veins when visible. The superficial great saphenous vein was also interrogated. Spectral Doppler was utilized to evaluate flow at rest and with distal augmentation maneuvers in the common femoral, femoral and popliteal veins.  COMPARISON:  04/01/2014  FINDINGS: RIGHT LOWER EXTREMITY  Common Femoral Vein: No evidence of thrombus. Normal compressibility, respiratory phasicity and response to augmentation.  Saphenofemoral Junction: No evidence of thrombus. Normal compressibility and flow on color Doppler imaging.  Profunda Femoral Vein: No evidence of thrombus. Normal compressibility and flow on color Doppler imaging.   Femoral Vein: No evidence of thrombus. Normal compressibility, respiratory phasicity and response to augmentation.  Popliteal Vein: No evidence of thrombus. Normal compressibility, respiratory phasicity and response to augmentation.  Calf Veins: No evidence of thrombus. Normal compressibility and flow on color Doppler imaging.  Superficial Great Saphenous Vein: No evidence of thrombus. Normal compressibility and flow on color Doppler imaging.  Venous Reflux:  None.  Other Findings:  None.  LEFT LOWER EXTREMITY  Common Femoral Vein: Evidence of noncompressible nonocclusive thrombus.  Saphenofemoral Junction: Evidence of noncompressible nonocclusive thrombus.  Profunda Femoral Vein: No evidence of thrombus. Normal compressibility and flow on color Doppler imaging.  Femoral Vein: No evidence of thrombus. Normal compressibility, respiratory phasicity and response to augmentation.  Popliteal Vein: No evidence of thrombus. Normal compressibility, respiratory phasicity and response to augmentation.  Calf Veins: No evidence of thrombus. Normal compressibility and flow on color Doppler imaging.  Superficial Great Saphenous Vein: No evidence of thrombus. Normal compressibility and flow on color Doppler imaging.  Venous Reflux:  None.  Other Findings:  None.  IMPRESSION: Evidence of noncompressible nonocclusive thrombus within the left common femoral vein extending into the left saphenofemoral junction.  No evidence of venous thrombus within the right lower extremity.   Electronically Signed   By: Marin Olp M.D.   On: 05/16/2014 15:38   Dg Chest Portable 1 View  05/16/2014   CLINICAL DATA:  Increased shortness of breath and right-sided chest pain since yesterday.  EXAM: PORTABLE CHEST - 1 VIEW  COMPARISON:  04/01/2014  FINDINGS: Central venous catheter remains unchanged in position. Shallow inspiration. Mild cardiac enlargement with prominent pulmonary vascular congestion. Perihilar airspace and interstitial infiltrates  suggesting edema. No blunting of costophrenic angles. No pneumothorax. Probable mild progression since previous study. Old right rib fractures.  IMPRESSION: Cardiac enlargement with pulmonary vascular congestion and perihilar edema demonstrating mild progression since prior study.   Electronically Signed   By: Lucienne Capers M.D.   On: 05/16/2014 04:56    Micro Results     Recent Results (from the past 240 hour(s))  Culture, blood (routine x 2)     Status: None   Collection Time: 05/16/14 11:00 AM  Result Value Ref Range Status   Specimen Description BLOOD  Final   Special Requests NONE  Final   Culture NO GROWTH 5 DAYS  Final   Report Status 05/21/2014 FINAL  Final  Culture, blood (routine x 2)     Status: None   Collection Time: 05/16/14 11:15 AM  Result Value Ref Range Status   Specimen Description BLOOD  Final   Special Requests NONE  Final   Culture NO GROWTH 5 DAYS  Final   Report Status 05/21/2014 FINAL  Final       Today   Subjective:   [name] today has no headache,no chest abdominal pain,no new weakness tingling or numbness, feels much better wants to go home today.   Objective:   Blood pressure 100/69, pulse 92, temperature 97.7 F (36.5 C), temperature source Oral, resp. rate 18, height '5\' 6"'$  (1.676 m), weight 68.04 kg (150 lb), SpO2 96 %.  No intake or output data in the 24 hours ending 05/24/14 0916  Exam Awake Alert, Oriented x 3, No new F.N deficits, Normal affect [place].AT,PERRAL Supple Neck,No JVD, No cervical lymphadenopathy appriciated.  Symmetrical Chest wall movement, Good air movement bilaterally, CTAB RRR,No Gallops,Rubs or new Murmurs, No Parasternal Heave +ve B.Sounds, Abd Soft, Non tender, No organomegaly appriciated, No rebound -guarding or rigidity. No Cyanosis, Clubbing or edema, No new Rash or bruise  Data Review   CBC w Diff:  Lab Results  Component Value Date   WBC 9.2 05/20/2014  WBC 9.6 04/25/2014   HGB 12.3 05/20/2014    HGB 10.7* 04/25/2014   HCT 38.1 05/20/2014   HCT 32.6* 04/25/2014   PLT 210 05/20/2014   PLT 175 04/25/2014   LYMPHOPCT 7 05/20/2014   LYMPHOPCT 4.9 04/20/2014   MONOPCT 4 05/20/2014   MONOPCT 3.9 04/20/2014   EOSPCT 0 05/20/2014   EOSPCT 0.0 04/20/2014   BASOPCT 1 05/20/2014   BASOPCT 0.2 04/20/2014    CMP:  Lab Results  Component Value Date   NA 138 05/20/2014   NA 139 04/25/2014   K 3.3* 05/20/2014   K 3.6 04/25/2014   CL 100* 05/20/2014   CL 105 04/25/2014   CO2 27 05/20/2014   CO2 27 04/25/2014   BUN 31* 05/20/2014   BUN 34* 04/25/2014   CREATININE 1.04* 05/20/2014   CREATININE 0.64 04/25/2014   PROT 6.3* 05/20/2014   PROT 5.5* 04/25/2014   ALBUMIN 3.5 05/20/2014   ALBUMIN 3.2* 04/25/2014   BILITOT 0.5 05/20/2014   ALKPHOS 57 05/20/2014   ALKPHOS 49 04/25/2014   AST 21 05/20/2014   AST 14* 04/25/2014   ALT 21 05/20/2014   ALT 14 04/25/2014  .   Total Time in preparing paper work, data evaluation and todays exam - 86 minutes  Amil Bouwman M.D on 05/24/2014 at 9:16 AM

## 2014-05-24 NOTE — Discharge Summary (Signed)
[name], is a 54 y.o. female  DOB [birth year]  MRN [id number].  Admission date:  05/16/2014  Admitting Physician  Vaughan Basta, MD  Discharge Date:  05/24/2014   Primary MD  Pcp Not In System  Recommendations for primary care physician for things to follow:     Admission Diagnosis  Swelling [R60.9] SOB (shortness of breath) [R06.02] Chest pain [R07.9]   Discharge Diagnosis  Swelling [R60.9] SOB (shortness of breath) [R06.02] Chest pain [R07.9]   Left leg DVT  Principal Problem:   Pneumonia Active Problems:   Adenocarcinoma of lung, stage 4   Shortness of breath      Past Medical History  Diagnosis Date  . Pneumonia   . Lung cancer     Past Surgical History  Procedure Laterality Date  . Video bronchoscopy Bilateral 01/14/2013    Procedure: VIDEO BRONCHOSCOPY WITHOUT FLUORO;  Surgeon: Wilhelmina Mcardle, MD;  Location: [facility] ENDOSCOPY;  Service: Cardiopulmonary;  Laterality: Bilateral;  . Video bronchoscopy N/A 01/27/2013    Procedure: VIDEO BRONCHOSCOPY;  Surgeon: Ivin Poot, MD;  Location: [place];  Service: Thoracic;  Laterality: N/A;  . Video assisted thoracoscopy Right 01/27/2013    Procedure: VIDEO ASSISTED THORACOSCOPY;  Surgeon: Ivin Poot, MD;  Location: [place];  Service: Thoracic;  Laterality: Right;  . Lung biopsy Right 01/27/2013    Procedure: LUNG BIOPSY;  Surgeon: Ivin Poot, MD;  Location: [place];  Service: Thoracic;  Laterality: Right;  . Pericardial tap N/A 01/11/2013    Procedure: PERICARDIAL TAP;  Surgeon: Blane Ohara, MD;  Location: [facility] CATH LAB;  Service: Cardiovascular;  Laterality: N/A;       History of present illness and  Hospital Course:     Kindly see H&P for history of present illness and admission details, please review complete Labs, Consult reports  and Test reports for all details in brief  HPI  from the history and physical done on the day of admission    Hospital Course  54 yr old female with stage 4 lung  Ca admitted  For  Sob.leg edema.found to have left leg Dvt,clinical pneumonia,started on Levaquin,lovenox.symptoms improved.discussed risk and benefits of anticoagulation and transitioned to Eliquis.discharged home and advised to follow up with Dr.Finnegan for her cancer chemo.   Discharge Condition: stable   Follow UP  Follow-up Information    Follow up with Lloyd Huger, MD In 1 week.   Specialty:  Oncology   Contact information:   [place] [address] [phone]         Discharge Instructions  and  Discharge Medication      Medication List    STOP taking these medications        gabapentin 300 MG capsule  Commonly known as:  NEURONTIN     Ipratropium-Albuterol 20-100 MCG/ACT Aers respimat  Commonly known as:  COMBIVENT     metoprolol tartrate 25 MG tablet  Commonly known as:  LOPRESSOR     potassium chloride SA 20 MEQ tablet  Commonly known as:  K-DUR,KLOR-CON      TAKE these medications        apixaban 5 MG Tabs tablet  Commonly known as:  ELIQUIS  Take 2 tablets (10 mg total) by mouth 2 (two) times daily.     apixaban 5 MG Tabs tablet  Commonly known as:  ELIQUIS  Take 1 tablet (5 mg total) by mouth 2 (two) times daily.  Start taking  on:  05/25/2014     chlorpheniramine-HYDROcodone 10-8 MG/5ML Lqcr  Commonly known as:  TUSSIONEX  Take 5 mLs by mouth every 12 (twelve) hours as needed for cough.     conjugated estrogens vaginal cream  Commonly known as:  PREMARIN  1gm pv qhs x 2 weeks, then two times per week x 2 weeks, then weekly for maintenance  Notes to Patient:  Take as directed      dexamethasone 4 MG tablet  Commonly known as:  DECADRON  Take 4 mg by mouth every 12 (twelve) hours.     diphenhydrAMINE 25 mg capsule  Commonly known as:  BENADRYL   Take 50 mg by mouth daily as needed for allergies.     dronabinol 5 MG capsule  Commonly known as:  MARINOL  Take 5 mg by mouth 2 (two) times daily before a meal.     fentaNYL 100 MCG/HR  Commonly known as:  DURAGESIC - dosed mcg/hr  Place 100 mcg onto the skin every 3 (three) days.     fentaNYL 25 MCG/HR patch  Commonly known as:  DURAGESIC - dosed mcg/hr  Place 1 patch (25 mcg total) onto the skin every 3 (three) days.  Notes to Patient:  Change every 3 days     furosemide 40 MG tablet  Commonly known as:  LASIX  Take 1 tablet (40 mg total) by mouth 2 (two) times daily.     HYDROmorphone 4 MG tablet  Commonly known as:  DILAUDID  Take by mouth every 4 (four) hours as needed for severe pain.     levofloxacin 500 MG tablet  Commonly known as:  LEVAQUIN  Take 1 tablet (500 mg total) by mouth daily.     lidocaine 5 %  Commonly known as:  LIDODERM  Place 1 patch onto the skin daily. Remove & Discard patch within 12 hours or as directed by MD     metoCLOPramide 5 MG tablet  Commonly known as:  REGLAN  Take 1 tablet by mouth 4 (four) times daily.     omeprazole 20 MG capsule  Commonly known as:  PRILOSEC  Take 20 mg by mouth daily.     ondansetron 8 MG tablet  Commonly known as:  ZOFRAN  Take 8 mg by mouth every 8 (eight) hours as needed for nausea or vomiting.     oxyCODONE-acetaminophen 5-325 MG per tablet  Commonly known as:  PERCOCET/ROXICET  Take 1-2 tablets by mouth every 4 (four) hours as needed for severe pain.     predniSONE 10 MG tablet  Commonly known as:  DELTASONE  Take 4 tabs  daily with food x 4 days, then 3 tabs daily x 4 days, then 2 tabs daily x 4 days, then 1 tab daily x4 days then stop. #40     promethazine 25 MG tablet  Commonly known as:  PHENERGAN  Take 1 tablet by mouth every 6 (six) hours as needed. For nausea and vomiting.     scopolamine 1 MG/3DAYS  Commonly known as:  TRANSDERM-SCOP  Place 1 patch onto the skin every 3 (three) days.      senna-docusate 8.6-50 MG per tablet  Commonly known as:  Senokot-S  Take 1 tablet by mouth at bedtime as needed for moderate constipation.     zolpidem 5 MG tablet  Commonly known as:  AMBIEN  Take 1 tablet (5 mg total) by mouth at bedtime as needed for sleep (insomnia).  Diet and Activity recommendation: See Discharge Instructions above   Consults obtained - oncology   Major procedures and Radiology Reports - PLEASE review detailed and final reports for all details, in brief -     Ct Angio Chest Pe W/cm &/or Wo Cm  05/16/2014   CLINICAL DATA:  Lower extremity swelling for 9 weeks. Increasing shortness of breath. 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  EXAM: CT ANGIOGRAPHY CHEST WITH CONTRAST  TECHNIQUE: Multidetector CT imaging of the chest was performed using the standard protocol during bolus administration of intravenous contrast. Multiplanar CT image reconstructions and MIPs were obtained to evaluate the vascular anatomy.  CONTRAST:  11m OMNIPAQUE IOHEXOL 350 MG/ML SOLN  COMPARISON:  March 06, 2014  FINDINGS: There is no pulmonary embolus. There is a small left pleural effusion and a moderate right pleural effusion, decreased in size compared to prior chest CT. There are extensive nodularity and increased pulmonary interstitium throughout multiple lobes of bilateral lungs consistent with patient's known cancer. There is more confluent consolidation of the right lower lobe, superimposed pneumonia is not excluded. There is no mediastinal or hilar lymphadenopathy. The heart size is normal. Minimal pericardial effusion is identified. Images of the visualized upper abdominal structures demonstrate fatty infiltration of liver. Scarring of left kidney is noted. There is a small hiatal hernia. The previously noted bony sclerosis are unchanged.  Review of the MIP images confirms the above findings.  IMPRESSION: No pulmonary embolus.  Extensive nodularity in  increased pulmonary interstitium throughout all lobes of bilateral lungs consistent with patient's known cancer. There is more confluent consolidation of the right lower lobe, superimposed pneumonia is not excluded.   Electronically Signed   By: WAbelardo DieselM.D.   On: 05/16/2014 09:11   UKoreaVenous Img Lower Bilateral  05/16/2014   CLINICAL DATA:  Sixteen week history of lower extremity swelling and pain. History of stage IV lung cancer.  EXAM: BILATERAL LOWER EXTREMITY VENOUS DOPPLER ULTRASOUND  TECHNIQUE: Gray-scale sonography with graded compression, as well as color Doppler and duplex ultrasound were performed to evaluate the lower extremity deep venous systems from the level of the common femoral vein and including the common femoral, femoral, profunda femoral, popliteal and calf veins including the posterior tibial, peroneal and gastrocnemius veins when visible. The superficial great saphenous vein was also interrogated. Spectral Doppler was utilized to evaluate flow at rest and with distal augmentation maneuvers in the common femoral, femoral and popliteal veins.  COMPARISON:  04/01/2014  FINDINGS: RIGHT LOWER EXTREMITY  Common Femoral Vein: No evidence of thrombus. Normal compressibility, respiratory phasicity and response to augmentation.  Saphenofemoral Junction: No evidence of thrombus. Normal compressibility and flow on color Doppler imaging.  Profunda Femoral Vein: No evidence of thrombus. Normal compressibility and flow on color Doppler imaging.  Femoral Vein: No evidence of thrombus. Normal compressibility, respiratory phasicity and response to augmentation.  Popliteal Vein: No evidence of thrombus. Normal compressibility, respiratory phasicity and response to augmentation.  Calf Veins: No evidence of thrombus. Normal compressibility and flow on color Doppler imaging.  Superficial Great Saphenous Vein: No evidence of thrombus. Normal compressibility and flow on color Doppler imaging.  Venous  Reflux:  None.  Other Findings:  None.  LEFT LOWER EXTREMITY  Common Femoral Vein: Evidence of noncompressible nonocclusive thrombus.  Saphenofemoral Junction: Evidence of noncompressible nonocclusive thrombus.  Profunda Femoral Vein: No evidence of thrombus. Normal compressibility and flow on color Doppler imaging.  Femoral Vein: No evidence of thrombus. Normal  compressibility, respiratory phasicity and response to augmentation.  Popliteal Vein: No evidence of thrombus. Normal compressibility, respiratory phasicity and response to augmentation.  Calf Veins: No evidence of thrombus. Normal compressibility and flow on color Doppler imaging.  Superficial Great Saphenous Vein: No evidence of thrombus. Normal compressibility and flow on color Doppler imaging.  Venous Reflux:  None.  Other Findings:  None.  IMPRESSION: Evidence of noncompressible nonocclusive thrombus within the left common femoral vein extending into the left saphenofemoral junction.  No evidence of venous thrombus within the right lower extremity.   Electronically Signed   By: Marin Olp M.D.   On: 05/16/2014 15:38   Dg Chest Portable 1 View  05/16/2014   CLINICAL DATA:  Increased shortness of breath and right-sided chest pain since yesterday.  EXAM: PORTABLE CHEST - 1 VIEW  COMPARISON:  04/01/2014  FINDINGS: Central venous catheter remains unchanged in position. Shallow inspiration. Mild cardiac enlargement with prominent pulmonary vascular congestion. Perihilar airspace and interstitial infiltrates suggesting edema. No blunting of costophrenic angles. No pneumothorax. Probable mild progression since previous study. Old right rib fractures.  IMPRESSION: Cardiac enlargement with pulmonary vascular congestion and perihilar edema demonstrating mild progression since prior study.   Electronically Signed   By: Lucienne Capers M.D.   On: 05/16/2014 04:56    Micro Results    Recent Results (from the past 240 hour(s))  Culture, blood (routine x  2)     Status: None   Collection Time: 05/16/14 11:00 AM  Result Value Ref Range Status   Specimen Description BLOOD  Final   Special Requests NONE  Final   Culture NO GROWTH 5 DAYS  Final   Report Status 05/21/2014 FINAL  Final  Culture, blood (routine x 2)     Status: None   Collection Time: 05/16/14 11:15 AM  Result Value Ref Range Status   Specimen Description BLOOD  Final   Special Requests NONE  Final   Culture NO GROWTH 5 DAYS  Final   Report Status 05/21/2014 FINAL  Final       Today   Subjective:   Raylan Troiani today has no headache,no chest  Pain,no sob. Objective:   Blood pressure 100/69, pulse 92, temperature 97.7 F (36.5 C), temperature source Oral, resp. rate 18, height '5\' 6"'$  (1.676 m), weight 68.04 kg (150 lb), SpO2 96 %.  No intake or output data in the 24 hours ending 05/24/14 0818  Exam Awake Alert, Oriented x 3, No new F.N deficits, Normal affect [place].AT,PERRAL Supple Neck,No JVD, No cervical lymphadenopathy appriciated.  Symmetrical Chest wall movement, Good air movement bilaterally, CTAB RRR,No Gallops,Rubs or new Murmurs, No Parasternal Heave +ve B.Sounds, Abd Soft, Non tender, No organomegaly appriciated, No rebound -guarding or rigidity. No Cyanosis, Clubbing or edema, No new Rash or bruise  Data Review   CBC w Diff:  Lab Results  Component Value Date   WBC 9.2 05/20/2014   WBC 9.6 04/25/2014   HGB 12.3 05/20/2014   HGB 10.7* 04/25/2014   HCT 38.1 05/20/2014   HCT 32.6* 04/25/2014   PLT 210 05/20/2014   PLT 175 04/25/2014   LYMPHOPCT 7 05/20/2014   LYMPHOPCT 4.9 04/20/2014   MONOPCT 4 05/20/2014   MONOPCT 3.9 04/20/2014   EOSPCT 0 05/20/2014   EOSPCT 0.0 04/20/2014   BASOPCT 1 05/20/2014   BASOPCT 0.2 04/20/2014    CMP:  Lab Results  Component Value Date   NA 138 05/20/2014   NA 139 04/25/2014   K 3.3* 05/20/2014  K 3.6 04/25/2014   CL 100* 05/20/2014   CL 105 04/25/2014   CO2 27 05/20/2014   CO2 27 04/25/2014   BUN 31*  05/20/2014   BUN 34* 04/25/2014   CREATININE 1.04* 05/20/2014   CREATININE 0.64 04/25/2014   PROT 6.3* 05/20/2014   PROT 5.5* 04/25/2014   ALBUMIN 3.5 05/20/2014   ALBUMIN 3.2* 04/25/2014   BILITOT 0.5 05/20/2014   ALKPHOS 57 05/20/2014   ALKPHOS 49 04/25/2014   AST 21 05/20/2014   AST 14* 04/25/2014   ALT 21 05/20/2014   ALT 14 04/25/2014  .   Total Time in preparing paper work, data evaluation and todays exam - 52 minutes  Brielle Moro M.D on 05/24/2014 at 8:18 AM

## 2014-06-03 ENCOUNTER — Encounter: Payer: Self-pay | Admitting: Emergency Medicine

## 2014-06-03 ENCOUNTER — Emergency Department
Admission: EM | Admit: 2014-06-03 | Discharge: 2014-06-03 | Disposition: A | Discharge status: Home / Self Care | Payer: Medicaid Other | Attending: Emergency Medicine | Admitting: Emergency Medicine

## 2014-06-03 ENCOUNTER — Inpatient Hospital Stay: Payer: Medicaid Other | Admitting: Oncology

## 2014-06-03 ENCOUNTER — Ambulatory Visit: Payer: Medicaid Other

## 2014-06-03 ENCOUNTER — Inpatient Hospital Stay: Payer: Medicaid Other

## 2014-06-03 DIAGNOSIS — R197 Diarrhea, unspecified: Secondary | ICD-10-CM | POA: Diagnosis not present

## 2014-06-03 DIAGNOSIS — Z79899 Other long term (current) drug therapy: Secondary | ICD-10-CM | POA: Insufficient documentation

## 2014-06-03 DIAGNOSIS — Z87891 Personal history of nicotine dependence: Secondary | ICD-10-CM | POA: Insufficient documentation

## 2014-06-03 DIAGNOSIS — R1010 Upper abdominal pain, unspecified: Secondary | ICD-10-CM | POA: Insufficient documentation

## 2014-06-03 DIAGNOSIS — G43A0 Cyclical vomiting, not intractable: Secondary | ICD-10-CM | POA: Insufficient documentation

## 2014-06-03 DIAGNOSIS — R112 Nausea with vomiting, unspecified: Secondary | ICD-10-CM | POA: Diagnosis present

## 2014-06-03 DIAGNOSIS — Z792 Long term (current) use of antibiotics: Secondary | ICD-10-CM | POA: Diagnosis not present

## 2014-06-03 DIAGNOSIS — R1115 Cyclical vomiting syndrome unrelated to migraine: Secondary | ICD-10-CM

## 2014-06-03 DIAGNOSIS — Z7952 Long term (current) use of systemic steroids: Secondary | ICD-10-CM | POA: Diagnosis not present

## 2014-06-03 LAB — COMPREHENSIVE METABOLIC PANEL
ALT: 21 U/L (ref 14–54)
ANION GAP: 10 (ref 5–15)
AST: 22 U/L (ref 15–41)
Albumin: 3.3 g/dL — ABNORMAL LOW (ref 3.5–5.0)
Alkaline Phosphatase: 48 U/L (ref 38–126)
BILIRUBIN TOTAL: 0.7 mg/dL (ref 0.3–1.2)
BUN: 20 mg/dL (ref 6–20)
CALCIUM: 8.4 mg/dL — AB (ref 8.9–10.3)
CO2: 33 mmol/L — ABNORMAL HIGH (ref 22–32)
Chloride: 97 mmol/L — ABNORMAL LOW (ref 101–111)
Creatinine, Ser: 0.65 mg/dL (ref 0.44–1.00)
GFR calc Af Amer: >60 mL/min (ref >60)
Glucose, Bld: 81 mg/dL (ref 65–99)
Potassium: 2.9 mmol/L — CL (ref 3.5–5.1)
Sodium: 140 mmol/L (ref 135–145)
TOTAL PROTEIN: 5.8 g/dL — AB (ref 6.5–8.1)

## 2014-06-03 LAB — URINALYSIS COMPLETE WITH MICROSCOPIC (ARMC ONLY)
Bacteria, UA: NONE SEEN
Bilirubin Urine: NEGATIVE
Glucose, UA: NEGATIVE mg/dL
Hgb urine dipstick: NEGATIVE
Ketones, ur: NEGATIVE mg/dL
Nitrite: NEGATIVE
Protein, ur: NEGATIVE mg/dL
Specific Gravity, Urine: 1.014 (ref 1.005–1.030)
pH: 9 — ABNORMAL HIGH (ref 5.0–8.0)

## 2014-06-03 LAB — CBC WITH DIFFERENTIAL/PLATELET
BASOS PCT: 1 %
Basophils Absolute: 0.1 10*3/uL (ref 0–0.1)
EOS ABS: 0 10*3/uL (ref 0–0.7)
Eosinophils Relative: 1 %
HCT: 37.6 % (ref 35.0–47.0)
Hemoglobin: 12 g/dL (ref 12.0–16.0)
Lymphocytes Relative: 13 %
Lymphs Abs: 1.1 10*3/uL (ref 1.0–3.6)
MCH: 26.8 pg (ref 26.0–34.0)
MCHC: 31.9 g/dL — ABNORMAL LOW (ref 32.0–36.0)
MCV: 84 fL (ref 80.0–100.0)
MONO ABS: 0.5 10*3/uL (ref 0.2–0.9)
Monocytes Relative: 7 %
NEUTROS PCT: 78 %
Neutro Abs: 6.7 10*3/uL — ABNORMAL HIGH (ref 1.4–6.5)
Platelets: 227 10*3/uL (ref 150–440)
RBC: 4.48 MIL/uL (ref 3.80–5.20)
RDW: 18.3 % — AB (ref 11.5–14.5)
WBC: 8.5 10*3/uL (ref 3.6–11.0)

## 2014-06-03 LAB — TROPONIN I

## 2014-06-03 LAB — LIPASE, BLOOD: LIPASE: 39 U/L (ref 22–51)

## 2014-06-03 MED ORDER — HALOPERIDOL LACTATE 5 MG/ML IJ SOLN
5.0000 mg | Freq: Once | INTRAMUSCULAR | Status: AC
  Administered 2014-06-03: 5 mg via INTRAVENOUS

## 2014-06-03 MED ORDER — HALOPERIDOL LACTATE 5 MG/ML IJ SOLN
INTRAMUSCULAR | Status: AC
  Administered 2014-06-03: 5 mg via INTRAVENOUS
  Filled 2014-06-03: qty 1

## 2014-06-03 MED ORDER — POTASSIUM CHLORIDE CRYS ER 20 MEQ PO TBCR
EXTENDED_RELEASE_TABLET | ORAL | Status: AC
  Administered 2014-06-03: 40 meq via ORAL
  Filled 2014-06-03: qty 2

## 2014-06-03 MED ORDER — POTASSIUM CHLORIDE CRYS ER 20 MEQ PO TBCR
40.0000 meq | EXTENDED_RELEASE_TABLET | Freq: Once | ORAL | Status: AC
  Administered 2014-06-03: 40 meq via ORAL

## 2014-06-03 MED ORDER — HALOPERIDOL LACTATE 5 MG/ML IJ SOLN
INTRAMUSCULAR | Status: AC
  Filled 2014-06-03: qty 1

## 2014-06-03 MED ORDER — HALOPERIDOL 5 MG PO TABS: 5.0000 mg | ORAL_TABLET | Freq: Three times a day (TID) | ORAL | Status: DC | PRN

## 2014-06-03 MED ORDER — METOCLOPRAMIDE HCL 5 MG/ML IJ SOLN
10.0000 mg | Freq: Once | INTRAMUSCULAR | Status: AC
  Administered 2014-06-03: 10 mg via INTRAVENOUS

## 2014-06-03 MED ORDER — HALOPERIDOL LACTATE 5 MG/ML IJ SOLN
2.0000 mg | Freq: Once | INTRAMUSCULAR | Status: AC
  Administered 2014-06-03: 2 mg via INTRAVENOUS

## 2014-06-03 MED ORDER — METOCLOPRAMIDE HCL 5 MG/ML IJ SOLN
INTRAMUSCULAR | Status: AC
  Administered 2014-06-03: 10 mg via INTRAVENOUS
  Filled 2014-06-03: qty 2

## 2014-06-03 MED ORDER — SODIUM CHLORIDE 0.9 % IV SOLN
1000.0000 mL | Freq: Once | INTRAVENOUS | Status: AC
  Administered 2014-06-03: 1000 mL via INTRAVENOUS

## 2014-06-03 NOTE — Discharge Instructions (Signed)
Cyclic Vomiting Syndrome °Cyclic vomiting syndrome is a benign condition in which patients experience bouts or cycles of severe nausea and vomiting that last for hours or even days. The bouts of nausea and vomiting alternate with longer periods of no symptoms and generally good health. Cyclic vomiting syndrome occurs mostly in children, but can affect adults. °CAUSES  °CVS has no known cause. Each episode is typically similar to the previous ones. The episodes tend to:  °· Start at about the same time of day. °· Last the same length of time. °· Present the same symptoms at the same level of intensity. °Cyclic vomiting syndrome can begin at any age in children and adults. Cyclic vomiting syndrome usually starts between the ages of 3 and 7 years. In adults, episodes tend to occur less often than they do in children, but they last longer. Furthermore, the events or situations that trigger episodes in adults cannot always be pinpointed as easily as they can in children. °There are 4 phases of cyclic vomiting syndrome: °1. Prodrome. The prodrome phase signals that an episode of nausea and vomiting is about to begin. This phase can last from just a few minutes to several hours. This phase is often marked by belly (abdominal) pain. Sometimes taking medicine early in the prodrome phase can stop an episode in progress. However, sometimes there is no warning. A person may simply wake up in the middle of the night or early morning and begin vomiting. °2. Episode. The episode phase consists of: °· Severe vomiting. °· Nausea. °· Gagging (retching). °3. Recovery. The recovery phase begins when the nausea and vomiting stop. Healthy color, appetite, and energy return. °4. Symptom-free interval. The symptom-free interval phase is the period between episodes when no symptoms are present. °TRIGGERS °Episodes can be triggered by an infection or event. Examples of triggers include: °· Infections. °· Colds, allergies, sinus problems, and  the flu. °· Eating certain foods such as chocolate or cheese. °· Foods with monosodium glutamate (MSG) or preservatives. °· Fast foods. °· Pre-packaged foods. °· Foods with low nutritional value (junk foods). °· Overeating. °· Eating just before going to bed. °· Hot weather. °· Dehydration. °· Not enough sleep or poor sleep quality. °· Physical exhaustion. °· Menstruation. °· Motion sickness. °· Emotional stress (school or home difficulties). °· Excitement or stress. °SYMPTOMS  °The main symptoms of cyclic vomiting syndrome are: °· Severe vomiting. °· Nausea. °· Gagging (retching). °Episodes usually begin at night or the first thing in the morning. Episodes may include vomiting or retching up to 5 or 6 times an hour during the worst of the episode. Episodes usually last anywhere from 1 to 4 days. Episodes can last for up to 10 days. Other symptoms include: °· Paleness. °· Exhaustion. °· Listlessness. °· Abdominal pain. °· Loose stools or diarrhea. °Sometimes the nausea and vomiting are so severe that a person appears to be almost unconscious. Sensitivity to light, headache, fever, dizziness, may also accompany an episode. In addition, the vomiting may cause drooling and excessive thirst. Drinking water usually leads to more vomiting, though the water can dilute the acid in the vomit, making the episode a little less painful. Continuous vomiting can lead to dehydration, which means that the body has lost excessive water and salts. °DIAGNOSIS  °Cyclic vomiting syndrome is hard to diagnose because there are no clear tests to identify it. A caregiver must diagnose cyclic vomiting syndrome by looking at symptoms and medical history. A caregiver must exclude more common diseases   or disorders that can also cause nausea and vomiting. Also, diagnosis takes time because caregivers need to identify a pattern or cycle to the vomiting. °TREATMENT  °Cyclic vomiting syndrome cannot be cured. Treatment varies, but people with  cyclic vomiting syndrome should get plenty of rest and sleep and take medications that prevent, stop, or lessen the vomiting episodes and other symptoms. °People whose episodes are frequent and long-lasting may be treated during the symptom-free intervals in an effort to prevent or ease future episodes. The symptom-free phase is a good time to eliminate anything known to trigger an episode. For example, if episodes are brought on by stress or excitement, this period is the time to find ways to reduce stress and stay calm. If sinus problems or allergies cause episodes, those conditions should be treated. The triggers listed above should be avoided or prevented. °Because of the similarities between migraine and cyclic vomiting syndrome, caregivers treat some people with severe cyclic vomiting syndrome with drugs that are also used for migraine headaches. The drugs are designed to: °· Prevent episodes. °· Reduce their frequency. °· Lessen their severity. °HOME CARE INSTRUCTIONS °Once a vomiting episode begins, treatment is supportive. It helps to stay in bed and sleep in a dark, quiet room. Severe nausea and vomiting may require hospitalization and intravenous (IV) fluids to prevent dehydration. Relaxing medications (sedatives) may help if the nausea continues. Sometimes, during the prodrome phase, it is possible to stop an episode from happening altogether. Only take over-the-counter or prescription medicines for pain, discomfort or fever as directed by your caregiver. Do not give aspirin to children. °During the recovery phase, drinking water and replacing lost electrolytes (salts in the blood) are very important. Electrolytes are salts that the body needs to function well and stay healthy. Symptoms during the recovery phase can vary. Some people find that their appetites return to normal immediately, while others need to begin by drinking clear liquids and then move slowly to solid food. °RELATED COMPLICATIONS °The  severe vomiting that defines cyclic vomiting syndrome is a risk factor for several complications: °· Dehydration--Vomiting causes the body to lose water quickly. °· Electrolyte imbalance--Vomiting also causes the body to lose the important salts it needs to keep working properly. °· Peptic esophagitis--The tube that connects the mouth to the stomach (esophagus) becomes injured from the stomach acid that comes up with the vomit. °· Hematemesis--The esophagus becomes irritated and bleeds, so blood mixes with the vomit. °· Mallory-Weiss tear--The lower end of the esophagus may tear open or the stomach may bruise from vomiting or retching. °· Tooth decay--The acid in the vomit can hurt the teeth by corroding the tooth enamel. °SEEK MEDICAL CARE IF: °You have questions or problems. °Document Released: 02/26/2001 Document Revised: 03/12/2011 Document Reviewed: 03/27/2010 °[employer]® Patient Information ©2015 [employer]. This information is not intended to replace advice given to you by your health care provider. Make sure you discuss any questions you have with your health care provider. ° °

## 2014-06-03 NOTE — ED Notes (Signed)
MD made aware of 2.9 potassium level.

## 2014-06-03 NOTE — ED Notes (Signed)
Peripheral IV not started due to patient having power port on right side of chest.

## 2014-06-03 NOTE — ED Provider Notes (Signed)
[facility] Emergency Department Provider Note     Time seen: ----------------------------------------- 7:49 AM on 06/03/2014 -----------------------------------------    I have reviewed the triage vital signs and the nursing notes.   HISTORY  Chief Complaint Nausea; Emesis; and Diarrhea    HPI [name] is a 54 y.o. female who presents ER for nausea vomiting diarrhea. Patient states this happen happens "a lot". Patient states she acute treatment but 2 weeks ago, since yesterday she's been unable to keep things down. Also claims abdominal pain in the upper abdomen. Nausea vomiting symptoms are to 10, nothing makes it better or worse.Patient states she is tried rectal suppositories as well as oral dissolving medications at home and these have not helped her symptoms.    Past Medical History  Diagnosis Date  . Pneumonia   . Lung cancer     Patient Active Problem List   Diagnosis Date Noted  . Pneumonia 05/16/2014  . Shortness of breath 05/16/2014  . Pleuritic chest pain 01/21/2013  . Pulmonary infiltrates 01/12/2013  . Adenocarcinoma of lung, stage 4 01/12/2013  . Pericardial tamponade 01/11/2013    Past Surgical History  Procedure Laterality Date  . Video bronchoscopy Bilateral 01/14/2013    Procedure: VIDEO BRONCHOSCOPY WITHOUT FLUORO;  Surgeon: Wilhelmina Mcardle, MD;  Location: [facility] ENDOSCOPY;  Service: Cardiopulmonary;  Laterality: Bilateral;  . Video bronchoscopy N/A 01/27/2013    Procedure: VIDEO BRONCHOSCOPY;  Surgeon: Ivin Poot, MD;  Location: [place];  Service: Thoracic;  Laterality: N/A;  . Video assisted thoracoscopy Right 01/27/2013    Procedure: VIDEO ASSISTED THORACOSCOPY;  Surgeon: Ivin Poot, MD;  Location: [place];  Service: Thoracic;  Laterality: Right;  . Lung biopsy Right 01/27/2013    Procedure: LUNG BIOPSY;  Surgeon: Ivin Poot, MD;  Location: [place];  Service: Thoracic;  Laterality: Right;  . Pericardial tap  N/A 01/11/2013    Procedure: PERICARDIAL TAP;  Surgeon: Blane Ohara, MD;  Location: [facility] CATH LAB;  Service: Cardiovascular;  Laterality: N/A;    Current Outpatient Rx  Name  Route  Sig  Dispense  Refill  . apixaban (ELIQUIS) 5 MG TABS tablet   Oral   Take 2 tablets (10 mg total) by mouth 2 (two) times daily.   14 tablet   0   . apixaban (ELIQUIS) 5 MG TABS tablet   Oral   Take 1 tablet (5 mg total) by mouth 2 (two) times daily.   60 tablet   0   . chlorpheniramine-HYDROcodone (TUSSIONEX) 10-8 MG/5ML LQCR   Oral   Take 5 mLs by mouth every 12 (twelve) hours as needed for cough.   115 mL   0   . conjugated estrogens (PREMARIN) vaginal cream      1gm pv qhs x 2 weeks, then two times per week x 2 weeks, then weekly for maintenance         . dexamethasone (DECADRON) 4 MG tablet   Oral   Take 4 mg by mouth every 12 (twelve) hours.         . diphenhydrAMINE (BENADRYL) 25 mg capsule   Oral   Take 50 mg by mouth daily as needed for allergies.         Marland Kitchen dronabinol (MARINOL) 5 MG capsule   Oral   Take 5 mg by mouth 2 (two) times daily before a meal.         . fentaNYL (DURAGESIC - DOSED MCG/HR) 100 MCG/HR  Transdermal   Place 100 mcg onto the skin every 3 (three) days.         . fentaNYL (DURAGESIC - DOSED MCG/HR) 25 MCG/HR patch   Transdermal   Place 1 patch (25 mcg total) onto the skin every 3 (three) days.   10 patch   0   . furosemide (LASIX) 40 MG tablet   Oral   Take 1 tablet (40 mg total) by mouth 2 (two) times daily.   60 tablet   1   . gabapentin (NEURONTIN) 300 MG capsule   Oral   Take 1 capsule (300 mg total) by mouth 3 (three) times daily.   90 capsule   1   . HYDROmorphone (DILAUDID) 4 MG tablet   Oral   Take by mouth every 4 (four) hours as needed for severe pain.         . Ipratropium-Albuterol (COMBIVENT) 20-100 MCG/ACT AERS respimat   Inhalation   Inhale 1 puff into the lungs 4 (four) times daily as needed. For shortness of  breath and wheezing   1 Inhaler   1   . levofloxacin (LEVAQUIN) 500 MG tablet   Oral   Take 1 tablet (500 mg total) by mouth daily.   7 tablet   0   . lidocaine (LIDODERM) 5 %   Transdermal   Place 1 patch onto the skin daily. Remove & Discard patch within 12 hours or as directed by MD         . metoCLOPramide (REGLAN) 5 MG tablet   Oral   Take 1 tablet by mouth 4 (four) times daily.      0   . metoprolol tartrate (LOPRESSOR) 25 MG tablet   Oral   Take 1 tablet (25 mg total) by mouth 3 (three) times daily.   90 tablet   1   . omeprazole (PRILOSEC) 20 MG capsule   Oral   Take 20 mg by mouth daily.         . ondansetron (ZOFRAN) 8 MG tablet   Oral   Take 8 mg by mouth every 8 (eight) hours as needed for nausea or vomiting.         Marland Kitchen oxyCODONE-acetaminophen (PERCOCET/ROXICET) 5-325 MG per tablet   Oral   Take 1-2 tablets by mouth every 4 (four) hours as needed for severe pain. Patient not taking: Reported on 05/20/2014   30 tablet   0   . potassium chloride SA (K-DUR,KLOR-CON) 20 MEQ tablet   Oral   Take 1 tablet (20 mEq total) by mouth 2 (two) times daily.   60 tablet   1   . predniSONE (DELTASONE) 10 MG tablet      Take 4 tabs  daily with food x 4 days, then 3 tabs daily x 4 days, then 2 tabs daily x 4 days, then 1 tab daily x4 days then stop. #40 Patient not taking: Reported on 05/06/2014   40 tablet   0   . promethazine (PHENERGAN) 25 MG tablet   Oral   Take 1 tablet by mouth every 6 (six) hours as needed. For nausea and vomiting.      0   . scopolamine (TRANSDERM-SCOP) 1 MG/3DAYS   Transdermal   Place 1 patch onto the skin every 3 (three) days.         Marland Kitchen senna-docusate (SENOKOT-S) 8.6-50 MG per tablet   Oral   Take 1 tablet by mouth at bedtime as needed for moderate constipation.         Marland Kitchen  zolpidem (AMBIEN) 5 MG tablet   Oral   Take 1 tablet (5 mg total) by mouth at bedtime as needed for sleep (insomnia). Patient not taking: Reported on  05/16/2014   30 tablet   0     Allergies No known allergies  Family History  Problem Relation Age of Onset  . Coronary artery disease Mother 32  . Lung cancer Mother   . Prostate cancer Father     Social History History  Substance Use Topics  . Smoking status: Former Research scientist (life sciences)  . Smokeless tobacco: Never Used  . Alcohol Use: Yes     Comment: occasionally has a drink    Review of Systems Constitutional: Negative for fever. Eyes: Negative for visual changes. ENT: Negative for sore throat. Cardiovascular: Negative for chest pain. Respiratory: Negative for shortness of breath. Gastrointestinal: Positive for abdominal pain vomiting and diarrhea. Genitourinary: Negative for dysuria. Musculoskeletal: Negative for back pain. Skin: Negative for rash. Neurological: Negative for headaches, positive for weakness  10-point ROS otherwise negative.  ____________________________________________   PHYSICAL EXAM:  VITAL SIGNS: ED Triage Vitals  Enc Vitals Group     BP 06/03/14 0737 117/81 mmHg     Pulse Rate 06/03/14 0737 98     Resp 06/03/14 0737 20     Temp 06/03/14 0737 97.6 F (36.4 C)     Temp Source 06/03/14 0737 Oral     SpO2 06/03/14 0737 95 %     Weight 06/03/14 0737 150 lb (68.04 kg)     Height 06/03/14 0737 '5\' 6"'$  (1.676 m)     Head Cir --      Peak Flow --      Pain Score 06/03/14 0737 8     Pain Loc --      Pain Edu? --      Excl. in [place]? --     Constitutional: Alert and oriented. Well appearing and in no distress. Eyes: Conjunctivae are normal. PERRL. Normal extraocular movements. ENT   Head: Normocephalic and atraumatic.   Nose: No congestion/rhinnorhea.   Mouth/Throat: Mucous membranes are moist.   Neck: No stridor. Hematological/Lymphatic/Immunilogical: No cervical lymphadenopathy. Cardiovascular: Normal rate, regular rhythm. Normal and symmetric distal pulses are present in all extremities. No murmurs, rubs, or gallops. Respiratory:  Normal respiratory effort without tachypnea nor retractions. Breath sounds are clear and equal bilaterally. No wheezes/rales/rhonchi. Gastrointestinal: Soft and nontender. No distention. No abdominal bruits. There is no CVA tenderness. Musculoskeletal: Nontender with normal range of motion in all extremities. No joint effusions. Lower extremity edema, increased on the left leg Neurologic:  Normal speech and language. No gross focal neurologic deficits are appreciated. Speech is normal. No gait instability. Skin:  Skin is warm, dry and intact. No rash noted. Psychiatric: Depressed mood and affect, patient gives short 1 word answers.     LABS (pertinent positives/negatives)  Labs Reviewed  CBC WITH DIFFERENTIAL/PLATELET - Abnormal; Notable for the following:    MCHC 31.9 (*)    RDW 18.3 (*)    Neutro Abs 6.7 (*)    All other components within normal limits  COMPREHENSIVE METABOLIC PANEL - Abnormal; Notable for the following:    Potassium 2.9 (*)    Chloride 97 (*)    CO2 33 (*)    Calcium 8.4 (*)    Total Protein 5.8 (*)    Albumin 3.3 (*)    All other components within normal limits  LIPASE, BLOOD  TROPONIN I  URINALYSIS COMPLETEWITH MICROSCOPIC ([facility] ONLY)  ____________________________________________  ED COURSE:  Pertinent labs & imaging results that were available during my care of the patient were reviewed by me and considered in my medical decision making (see chart for details).  Check basic labs, give IV fluids, perhaps anti-psychotics and reevaluate. Apparent cyclic vomiting syndrome Patient reports several doses of Haldol here, with improvement. She's not had any vomiting was able to keep down K-Lor by mouth for mild hypokalemia. Patient states she still does not feel well, will discharge with Haldol she can take at home for the nausea and vomiting. Her labs otherwise are grossly  unremarkable ____________________________________________   RADIOLOGY  None  ____________________________________________    FINAL ASSESSMENT AND PLAN  Cyclic vomiting syndrome  Plan: Patient is encouraged to take antiemetics as previously prescribed, will add Haldol as needed for persistent nausea vomiting. According to the patient and family this been a chronic issue which seems to be alleviated here with antipsychotics. She is stable for follow-up with her doctor as an outpatient.    Earleen Newport, MD   Earleen Newport, MD 06/03/14 [phone]

## 2014-06-03 NOTE — ED Notes (Signed)
Pt with n/v/d since yesterday. Unable to keep anything down. Also c/o abd pain.

## 2014-06-04 NOTE — Progress Notes (Signed)
[place]  [phone] [phone]) [phone]  ID: [name] OB: 07-18-1960  MR#: [id number]  ALP#:[id number]  Patient Care Team: Pcp Not In System as PCP - General  CHIEF COMPLAINT:  Chief Complaint  Patient presents with  . Follow-up    lung cancer  . Hospitalization Follow-up    diagnosed with pnuemonia/DVT left leg    INTERVAL HISTORY: Patient returns to clinic today for further evaluation and consideration of her next infusion of nivolumab.  She continues to have mild bilateral peripheral edema, which is essentially unchanged. She otherwise feels well and is asymptomatic. She has no neurologic complaints.  Her pain is well-controlled on her current narcotic regimen. She does not complain of fevers today.  She denies any nausea, vomiting, constipation, or diarrhea. Patient offers no further specific complaints today.   REVIEW OF SYSTEMS:   Review of Systems  Respiratory: Positive for shortness of breath.   Gastrointestinal: Positive for nausea.  Neurological: Positive for weakness.    As per HPI. Otherwise, a complete review of systems is negatve.  PAST MEDICAL HISTORY: Past Medical History  Diagnosis Date  . Pneumonia   . Lung cancer     PAST SURGICAL HISTORY: Past Surgical History  Procedure Laterality Date  . Video bronchoscopy Bilateral 01/14/2013    Procedure: VIDEO BRONCHOSCOPY WITHOUT FLUORO;  Surgeon: Wilhelmina Mcardle, MD;  Location: [facility] ENDOSCOPY;  Service: Cardiopulmonary;  Laterality: Bilateral;  . Video bronchoscopy N/A 01/27/2013    Procedure: VIDEO BRONCHOSCOPY;  Surgeon: Ivin Poot, MD;  Location: [place];  Service: Thoracic;  Laterality: N/A;  . Video assisted thoracoscopy Right 01/27/2013    Procedure: VIDEO ASSISTED THORACOSCOPY;  Surgeon: Ivin Poot, MD;  Location: [place];  Service: Thoracic;  Laterality: Right;  . Lung biopsy Right 01/27/2013    Procedure: LUNG BIOPSY;  Surgeon: Ivin Poot, MD;   Location: [place];  Service: Thoracic;  Laterality: Right;  . Pericardial tap N/A 01/11/2013    Procedure: PERICARDIAL TAP;  Surgeon: Blane Ohara, MD;  Location: [facility] CATH LAB;  Service: Cardiovascular;  Laterality: N/A;    FAMILY HISTORY Family History  Problem Relation Age of Onset  . Coronary artery disease Mother 86  . Lung cancer Mother   . Prostate cancer Father        ADVANCED DIRECTIVES:    HEALTH MAINTENANCE: History  Substance Use Topics  . Smoking status: Former Research scientist (life sciences)  . Smokeless tobacco: Never Used  . Alcohol Use: Yes     Comment: occasionally has a drink     Colonoscopy:  PAP:  Bone density:  Lipid panel:  Allergies  Allergen Reactions  . No Known Allergies     Current Outpatient Prescriptions  Medication Sig Dispense Refill  . apixaban (ELIQUIS) 5 MG TABS tablet Take 2 tablets (10 mg total) by mouth 2 (two) times daily. 14 tablet 0  . apixaban (ELIQUIS) 5 MG TABS tablet Take 1 tablet (5 mg total) by mouth 2 (two) times daily. 60 tablet 0  . chlorpheniramine-HYDROcodone (TUSSIONEX) 10-8 MG/5ML LQCR Take 5 mLs by mouth every 12 (twelve) hours as needed for cough. 115 mL 0  . conjugated estrogens (PREMARIN) vaginal cream 1gm pv qhs x 2 weeks, then two times per week x 2 weeks, then weekly for maintenance    . dexamethasone (DECADRON) 4 MG tablet Take 4 mg by mouth every 12 (twelve) hours.    . diphenhydrAMINE (BENADRYL) 25 mg capsule Take 50 mg by mouth daily  as needed for allergies.    Marland Kitchen dronabinol (MARINOL) 5 MG capsule Take 5 mg by mouth 2 (two) times daily before a meal.    . fentaNYL (DURAGESIC - DOSED MCG/HR) 100 MCG/HR Place 100 mcg onto the skin every 3 (three) days.    . fentaNYL (DURAGESIC - DOSED MCG/HR) 25 MCG/HR patch Place 1 patch (25 mcg total) onto the skin every 3 (three) days. 10 patch 0  . furosemide (LASIX) 40 MG tablet Take 1 tablet (40 mg total) by mouth 2 (two) times daily. 60 tablet 1  . gabapentin (NEURONTIN) 300 MG capsule Take 1  capsule (300 mg total) by mouth 3 (three) times daily. 90 capsule 1  . HYDROmorphone (DILAUDID) 4 MG tablet Take by mouth every 4 (four) hours as needed for severe pain.    . Ipratropium-Albuterol (COMBIVENT) 20-100 MCG/ACT AERS respimat Inhale 1 puff into the lungs 4 (four) times daily as needed. For shortness of breath and wheezing 1 Inhaler 1  . levofloxacin (LEVAQUIN) 500 MG tablet Take 1 tablet (500 mg total) by mouth daily. 7 tablet 0  . lidocaine (LIDODERM) 5 % Place 1 patch onto the skin daily. Remove & Discard patch within 12 hours or as directed by MD    . metoCLOPramide (REGLAN) 5 MG tablet Take 1 tablet by mouth 4 (four) times daily.  0  . metoprolol tartrate (LOPRESSOR) 25 MG tablet Take 1 tablet (25 mg total) by mouth 3 (three) times daily. 90 tablet 1  . omeprazole (PRILOSEC) 20 MG capsule Take 20 mg by mouth daily.    . ondansetron (ZOFRAN) 8 MG tablet Take 8 mg by mouth every 8 (eight) hours as needed for nausea or vomiting.    . potassium chloride SA (K-DUR,KLOR-CON) 20 MEQ tablet Take 1 tablet (20 mEq total) by mouth 2 (two) times daily. 60 tablet 1  . promethazine (PHENERGAN) 25 MG tablet Take 1 tablet by mouth every 6 (six) hours as needed. For nausea and vomiting.  0  . scopolamine (TRANSDERM-SCOP) 1 MG/3DAYS Place 1 patch onto the skin every 3 (three) days.    Marland Kitchen senna-docusate (SENOKOT-S) 8.6-50 MG per tablet Take 1 tablet by mouth at bedtime as needed for moderate constipation.    . haloperidol (HALDOL) 5 MG tablet Take 1 tablet (5 mg total) by mouth every 8 (eight) hours as needed for agitation. As needed for vomiting not improved by nausea medications 20 tablet 0  . oxyCODONE-acetaminophen (PERCOCET/ROXICET) 5-325 MG per tablet Take 1-2 tablets by mouth every 4 (four) hours as needed for severe pain. (Patient not taking: Reported on 05/20/2014) 30 tablet 0  . predniSONE (DELTASONE) 10 MG tablet Take 4 tabs  daily with food x 4 days, then 3 tabs daily x 4 days, then 2 tabs  daily x 4 days, then 1 tab daily x4 days then stop. #40 (Patient not taking: Reported on 05/06/2014) 40 tablet 0  . zolpidem (AMBIEN) 5 MG tablet Take 1 tablet (5 mg total) by mouth at bedtime as needed for sleep (insomnia). (Patient not taking: Reported on 05/16/2014) 30 tablet 0   No current facility-administered medications for this visit.   Facility-Administered Medications Ordered in Other Visits  Medication Dose Route Frequency Provider Last Rate Last Dose  . sodium chloride 0.9 % injection 10 mL  10 mL Intracatheter PRN Lloyd Huger, MD   10 mL at 05/06/14 1435    OBJECTIVE: Filed Vitals:   05/20/14 1448  BP: 110/75  Pulse: 106  Temp:  97.1 F (36.2 C)  Resp: 20     Body mass index is 25.77 kg/(m^2).    ECOG FS:0 - Asymptomatic  General: Well-developed, well-nourished, no acute distress. Eyes: anicteric sclera. Lungs: Clear to auscultation bilaterally. Heart: Regular rate and rhythm. No rubs, murmurs, or gallops. Abdomen: Soft, nontender, nondistended. No organomegaly noted, normoactive bowel sounds. Musculoskeletal: 1+ bilateral lower extremity edema Neuro: Alert, answering all questions appropriately. Cranial nerves grossly intact. Skin: No rashes or petechiae noted. Psych: Normal affect.    LAB RESULTS:  Lab Results  Component Value Date   NA 140 06/03/2014   K 2.9* 06/03/2014   CL 97* 06/03/2014   CO2 33* 06/03/2014   GLUCOSE 81 06/03/2014   BUN 20 06/03/2014   CREATININE 0.65 06/03/2014   CALCIUM 8.4* 06/03/2014   PROT 5.8* 06/03/2014   ALBUMIN 3.3* 06/03/2014   AST 22 06/03/2014   ALT 21 06/03/2014   ALKPHOS 48 06/03/2014   BILITOT 0.7 06/03/2014   GFRNONAA >60 06/03/2014   GFRAA >60 06/03/2014    Lab Results  Component Value Date   WBC 8.5 06/03/2014   NEUTROABS 6.7* 06/03/2014   HGB 12.0 06/03/2014   HCT 37.6 06/03/2014   MCV 84.0 06/03/2014   PLT 227 06/03/2014     STUDIES: Ct Angio Chest Pe W/cm &/or Wo Cm  05/16/2014   CLINICAL  DATA:  Lower extremity swelling for 9 weeks. Increasing shortness of breath. 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  EXAM: CT ANGIOGRAPHY CHEST WITH CONTRAST  TECHNIQUE: Multidetector CT imaging of the chest was performed using the standard protocol during bolus administration of intravenous contrast. Multiplanar CT image reconstructions and MIPs were obtained to evaluate the vascular anatomy.  CONTRAST:  92m OMNIPAQUE IOHEXOL 350 MG/ML SOLN  COMPARISON:  March 06, 2014  FINDINGS: There is no pulmonary embolus. There is a small left pleural effusion and a moderate right pleural effusion, decreased in size compared to prior chest CT. There are extensive nodularity and increased pulmonary interstitium throughout multiple lobes of bilateral lungs consistent with patient's known cancer. There is more confluent consolidation of the right lower lobe, superimposed pneumonia is not excluded. There is no mediastinal or hilar lymphadenopathy. The heart size is normal. Minimal pericardial effusion is identified. Images of the visualized upper abdominal structures demonstrate fatty infiltration of liver. Scarring of left kidney is noted. There is a small hiatal hernia. The previously noted bony sclerosis are unchanged.  Review of the MIP images confirms the above findings.  IMPRESSION: No pulmonary embolus.  Extensive nodularity in increased pulmonary interstitium throughout all lobes of bilateral lungs consistent with patient's known cancer. There is more confluent consolidation of the right lower lobe, superimposed pneumonia is not excluded.   Electronically Signed   By: WAbelardo DieselM.D.   On: 05/16/2014 09:11   UKoreaVenous Img Lower Bilateral  05/16/2014   CLINICAL DATA:  Sixteen week history of lower extremity swelling and pain. History of stage IV lung cancer.  EXAM: BILATERAL LOWER EXTREMITY VENOUS DOPPLER ULTRASOUND  TECHNIQUE: Gray-scale sonography with graded compression, as well as  color Doppler and duplex ultrasound were performed to evaluate the lower extremity deep venous systems from the level of the common femoral vein and including the common femoral, femoral, profunda femoral, popliteal and calf veins including the posterior tibial, peroneal and gastrocnemius veins when visible. The superficial great saphenous vein was also interrogated. Spectral Doppler was utilized to evaluate flow at rest and with distal augmentation  maneuvers in the common femoral, femoral and popliteal veins.  COMPARISON:  04/01/2014  FINDINGS: RIGHT LOWER EXTREMITY  Common Femoral Vein: No evidence of thrombus. Normal compressibility, respiratory phasicity and response to augmentation.  Saphenofemoral Junction: No evidence of thrombus. Normal compressibility and flow on color Doppler imaging.  Profunda Femoral Vein: No evidence of thrombus. Normal compressibility and flow on color Doppler imaging.  Femoral Vein: No evidence of thrombus. Normal compressibility, respiratory phasicity and response to augmentation.  Popliteal Vein: No evidence of thrombus. Normal compressibility, respiratory phasicity and response to augmentation.  Calf Veins: No evidence of thrombus. Normal compressibility and flow on color Doppler imaging.  Superficial Great Saphenous Vein: No evidence of thrombus. Normal compressibility and flow on color Doppler imaging.  Venous Reflux:  None.  Other Findings:  None.  LEFT LOWER EXTREMITY  Common Femoral Vein: Evidence of noncompressible nonocclusive thrombus.  Saphenofemoral Junction: Evidence of noncompressible nonocclusive thrombus.  Profunda Femoral Vein: No evidence of thrombus. Normal compressibility and flow on color Doppler imaging.  Femoral Vein: No evidence of thrombus. Normal compressibility, respiratory phasicity and response to augmentation.  Popliteal Vein: No evidence of thrombus. Normal compressibility, respiratory phasicity and response to augmentation.  Calf Veins: No evidence of  thrombus. Normal compressibility and flow on color Doppler imaging.  Superficial Great Saphenous Vein: No evidence of thrombus. Normal compressibility and flow on color Doppler imaging.  Venous Reflux:  None.  Other Findings:  None.  IMPRESSION: Evidence of noncompressible nonocclusive thrombus within the left common femoral vein extending into the left saphenofemoral junction.  No evidence of venous thrombus within the right lower extremity.   Electronically Signed   By: Marin Olp M.D.   On: 05/16/2014 15:38   Dg Chest Portable 1 View  05/16/2014   CLINICAL DATA:  Increased shortness of breath and right-sided chest pain since yesterday.  EXAM: PORTABLE CHEST - 1 VIEW  COMPARISON:  04/01/2014  FINDINGS: Central venous catheter remains unchanged in position. Shallow inspiration. Mild cardiac enlargement with prominent pulmonary vascular congestion. Perihilar airspace and interstitial infiltrates suggesting edema. No blunting of costophrenic angles. No pneumothorax. Probable mild progression since previous study. Old right rib fractures.  IMPRESSION: Cardiac enlargement with pulmonary vascular congestion and perihilar edema demonstrating mild progression since prior study.   Electronically Signed   By: Lucienne Capers M.D.   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  EGFR and ALK negative.  PLAN:    1.  Lung cancer: Pathology results from open lung biopsy revealed clear lymphangitic spread of tumor. Proceed with cycle 9 of 29m/kg nivolumab. Patient last received Zometa on November 17, 2013. Return to clinic in 2 weeks for consideration of cycle 10. Will reimage in approximately May or June of 2016.   2. Pain: Continue current narcotic regimen, patient does not wish to change at this time. 3. Poor appetite/nausea/dehydration: Improved. Continue anti-emetic regimen. 4. Dizziness: Resolved. 5. Hypotension: Improved, monitor. 6  Thrombocytopenia:  Resoved. Monitor. 7. Hypokalemia: Resolved. Continue oral potassium supplementation as ordered. 8. Peripheral edema: Continue Lasix to 40 mg twice a day.  Patient expressed understanding and was in agreement with this plan. She also understands that She can call clinic at any time with any questions, concerns, or complaints.   Adenocarcinoma of lung, stage 4   Staging form: Lung, AJCC 7th Edition     Clinical stage from 04/26/2014: T4, N0, M1 - Signed by TLloyd Huger MD on 04/26/2014  Lloyd Huger, MD   06/04/2014 1:41 PM

## 2014-06-08 ENCOUNTER — Other Ambulatory Visit: Payer: Self-pay

## 2014-06-08 MED ORDER — FUROSEMIDE 40 MG PO TABS: 40.0000 mg | ORAL_TABLET | Freq: Two times a day (BID) | ORAL | Status: AC

## 2014-06-08 MED ORDER — SCOPOLAMINE 1 MG/3DAYS TD PT72: 1.0000 | MEDICATED_PATCH | TRANSDERMAL | Status: DC

## 2014-06-09 ENCOUNTER — Other Ambulatory Visit: Payer: Self-pay | Admitting: Family Medicine

## 2014-06-09 ENCOUNTER — Encounter: Payer: Self-pay | Admitting: *Deleted

## 2014-06-09 ENCOUNTER — Other Ambulatory Visit: Payer: Self-pay

## 2014-06-09 ENCOUNTER — Emergency Department
Admission: EM | Admit: 2014-06-09 | Discharge: 2014-06-09 | Disposition: A | Discharge status: Home / Self Care | Payer: Medicaid Other | Attending: Emergency Medicine | Admitting: Emergency Medicine

## 2014-06-09 ENCOUNTER — Emergency Department: Payer: Medicaid Other

## 2014-06-09 DIAGNOSIS — R0600 Dyspnea, unspecified: Secondary | ICD-10-CM | POA: Diagnosis not present

## 2014-06-09 DIAGNOSIS — Z7951 Long term (current) use of inhaled steroids: Secondary | ICD-10-CM | POA: Diagnosis not present

## 2014-06-09 DIAGNOSIS — Z79899 Other long term (current) drug therapy: Secondary | ICD-10-CM | POA: Diagnosis not present

## 2014-06-09 DIAGNOSIS — R0781 Pleurodynia: Secondary | ICD-10-CM | POA: Diagnosis not present

## 2014-06-09 DIAGNOSIS — R0602 Shortness of breath: Secondary | ICD-10-CM

## 2014-06-09 DIAGNOSIS — Z87891 Personal history of nicotine dependence: Secondary | ICD-10-CM | POA: Diagnosis not present

## 2014-06-09 HISTORY — DX: Acute embolism and thrombosis of unspecified deep veins of unspecified lower extremity: I82.409

## 2014-06-09 LAB — CBC WITH DIFFERENTIAL/PLATELET
BASOS PCT: 2 %
Basophils Absolute: 0.2 10*3/uL — ABNORMAL HIGH (ref 0–0.1)
Eosinophils Absolute: 0 10*3/uL (ref 0–0.7)
Eosinophils Relative: 0 %
HCT: 40 % (ref 35.0–47.0)
Hemoglobin: 13.3 g/dL (ref 12.0–16.0)
LYMPHS PCT: 6 %
Lymphs Abs: 0.7 10*3/uL — ABNORMAL LOW (ref 1.0–3.6)
MCH: 27.4 pg (ref 26.0–34.0)
MCHC: 33.2 g/dL (ref 32.0–36.0)
MCV: 82.6 fL (ref 80.0–100.0)
MONOS PCT: 5 %
Monocytes Absolute: 0.6 10*3/uL (ref 0.2–0.9)
NEUTROS ABS: 9.9 10*3/uL — AB (ref 1.4–6.5)
NEUTROS PCT: 87 %
Platelets: 229 10*3/uL (ref 150–440)
RBC: 4.84 MIL/uL (ref 3.80–5.20)
RDW: 19.1 % — AB (ref 11.5–14.5)
WBC: 11.5 10*3/uL — ABNORMAL HIGH (ref 3.6–11.0)

## 2014-06-09 LAB — BASIC METABOLIC PANEL
Anion gap: 12 (ref 5–15)
BUN: 26 mg/dL — AB (ref 6–20)
CALCIUM: 9.1 mg/dL (ref 8.9–10.3)
CO2: 29 mmol/L (ref 22–32)
Chloride: 94 mmol/L — ABNORMAL LOW (ref 101–111)
Creatinine, Ser: 0.65 mg/dL (ref 0.44–1.00)
GFR calc non Af Amer: >60 mL/min (ref >60)
Glucose, Bld: 147 mg/dL — ABNORMAL HIGH (ref 65–99)
Potassium: 3.3 mmol/L — ABNORMAL LOW (ref 3.5–5.1)
Sodium: 135 mmol/L (ref 135–145)

## 2014-06-09 LAB — HEPATIC FUNCTION PANEL
ALK PHOS: 64 U/L (ref 38–126)
ALT: 27 U/L (ref 14–54)
AST: 25 U/L (ref 15–41)
Albumin: 3.8 g/dL (ref 3.5–5.0)
BILIRUBIN TOTAL: 0.6 mg/dL (ref 0.3–1.2)
Bilirubin, Direct: <0.1 mg/dL — ABNORMAL LOW (ref 0.1–0.5)
TOTAL PROTEIN: 6.5 g/dL (ref 6.5–8.1)

## 2014-06-09 LAB — URINALYSIS COMPLETE WITH MICROSCOPIC (ARMC ONLY)
Bilirubin Urine: NEGATIVE
GLUCOSE, UA: NEGATIVE mg/dL
Hgb urine dipstick: NEGATIVE
Ketones, ur: NEGATIVE mg/dL
Nitrite: NEGATIVE
Protein, ur: NEGATIVE mg/dL
SPECIFIC GRAVITY, URINE: 1.021 (ref 1.005–1.030)
pH: 7 (ref 5.0–8.0)

## 2014-06-09 LAB — LIPASE, BLOOD: Lipase: 62 U/L — ABNORMAL HIGH (ref 22–51)

## 2014-06-09 LAB — TROPONIN I: Troponin I: <0.03 ng/mL (ref <0.031)

## 2014-06-09 MED ORDER — SODIUM CHLORIDE 0.9 % IV BOLUS (SEPSIS)
1000.0000 mL | Freq: Once | INTRAVENOUS | Status: AC
  Administered 2014-06-09: 1000 mL via INTRAVENOUS

## 2014-06-09 MED ORDER — IOHEXOL 350 MG/ML SOLN
100.0000 mL | Freq: Once | INTRAVENOUS | Status: AC | PRN
  Administered 2014-06-09: 100 mL via INTRAVENOUS

## 2014-06-09 NOTE — Discharge Instructions (Signed)

## 2014-06-09 NOTE — ED Provider Notes (Signed)
[facility] Emergency Department Provider Note  ____________________________________________  Time seen: 12:05 PM  I have reviewed the triage vital signs and the nursing notes.   HISTORY  Chief Complaint Shortness of Breath    HPI [name] is a 54 y.o. female who complains of shortness of breath, pleuritic type chest pain in the mid chest, and nonproductive cough since yesterday. The symptoms have been constant since then. She was easily treated for DVT in the left leg with Eliquis 3 weeks ago. She's been taking the Eliquis "faithfully", as well as all her other medicines. She denies any syncope abdominal pain vomiting or diarrhea. No fevers or chills. No exertional symptoms  Pain is nonradiating and apart from being worse with deep inspiration, no aggravating or alleviating factors.   Past Medical History  Diagnosis Date  . Pneumonia   . Lung cancer   . DVT (deep venous thrombosis)     Patient Active Problem List   Diagnosis Date Noted  . Pneumonia 05/16/2014  . Shortness of breath 05/16/2014  . Pleuritic chest pain 01/21/2013  . Pulmonary infiltrates 01/12/2013  . Adenocarcinoma of lung, stage 4 01/12/2013  . Pericardial tamponade 01/11/2013    Past Surgical History  Procedure Laterality Date  . Video bronchoscopy Bilateral 01/14/2013    Procedure: VIDEO BRONCHOSCOPY WITHOUT FLUORO;  Surgeon: Wilhelmina Mcardle, MD;  Location: [facility] ENDOSCOPY;  Service: Cardiopulmonary;  Laterality: Bilateral;  . Video bronchoscopy N/A 01/27/2013    Procedure: VIDEO BRONCHOSCOPY;  Surgeon: Ivin Poot, MD;  Location: [place];  Service: Thoracic;  Laterality: N/A;  . Video assisted thoracoscopy Right 01/27/2013    Procedure: VIDEO ASSISTED THORACOSCOPY;  Surgeon: Ivin Poot, MD;  Location: [place];  Service: Thoracic;  Laterality: Right;  . Lung biopsy Right 01/27/2013    Procedure: LUNG BIOPSY;  Surgeon: Ivin Poot, MD;  Location: [place];  Service:  Thoracic;  Laterality: Right;  . Pericardial tap N/A 01/11/2013    Procedure: PERICARDIAL TAP;  Surgeon: Blane Ohara, MD;  Location: [facility] CATH LAB;  Service: Cardiovascular;  Laterality: N/A;    Current Outpatient Rx  Name  Route  Sig  Dispense  Refill  . apixaban (ELIQUIS) 5 MG TABS tablet   Oral   Take 2 tablets (10 mg total) by mouth 2 (two) times daily.   14 tablet   0   . apixaban (ELIQUIS) 5 MG TABS tablet   Oral   Take 1 tablet (5 mg total) by mouth 2 (two) times daily.   60 tablet   0   . chlorpheniramine-HYDROcodone (TUSSIONEX) 10-8 MG/5ML LQCR   Oral   Take 5 mLs by mouth every 12 (twelve) hours as needed for cough.   115 mL   0   . conjugated estrogens (PREMARIN) vaginal cream      1gm pv qhs x 2 weeks, then two times per week x 2 weeks, then weekly for maintenance         . dexamethasone (DECADRON) 4 MG tablet   Oral   Take 4 mg by mouth every 12 (twelve) hours.         . diphenhydrAMINE (BENADRYL) 25 mg capsule   Oral   Take 50 mg by mouth daily as needed for allergies.         Marland Kitchen dronabinol (MARINOL) 5 MG capsule   Oral   Take 5 mg by mouth 2 (two) times daily before a meal.         .  fentaNYL (DURAGESIC - DOSED MCG/HR) 100 MCG/HR   Transdermal   Place 100 mcg onto the skin every 3 (three) days.         . fentaNYL (DURAGESIC - DOSED MCG/HR) 25 MCG/HR patch   Transdermal   Place 1 patch (25 mcg total) onto the skin every 3 (three) days.   10 patch   0   . furosemide (LASIX) 40 MG tablet   Oral   Take 1 tablet (40 mg total) by mouth 2 (two) times daily.   180 tablet   0   . gabapentin (NEURONTIN) 300 MG capsule   Oral   Take 1 capsule (300 mg total) by mouth 3 (three) times daily.   90 capsule   1   . haloperidol (HALDOL) 5 MG tablet   Oral   Take 1 tablet (5 mg total) by mouth every 8 (eight) hours as needed for agitation. As needed for vomiting not improved by nausea medications   20 tablet   0   . HYDROmorphone (DILAUDID)  4 MG tablet   Oral   Take by mouth every 4 (four) hours as needed for severe pain.         . Ipratropium-Albuterol (COMBIVENT) 20-100 MCG/ACT AERS respimat   Inhalation   Inhale 1 puff into the lungs 4 (four) times daily as needed. For shortness of breath and wheezing   1 Inhaler   1   . levofloxacin (LEVAQUIN) 500 MG tablet   Oral   Take 1 tablet (500 mg total) by mouth daily.   7 tablet   0   . lidocaine (LIDODERM) 5 %   Transdermal   Place 1 patch onto the skin daily. Remove & Discard patch within 12 hours or as directed by MD         . metoCLOPramide (REGLAN) 5 MG tablet   Oral   Take 1 tablet by mouth 4 (four) times daily.      0   . metoprolol tartrate (LOPRESSOR) 25 MG tablet   Oral   Take 1 tablet (25 mg total) by mouth 3 (three) times daily.   90 tablet   1   . omeprazole (PRILOSEC) 20 MG capsule   Oral   Take 20 mg by mouth daily.         . ondansetron (ZOFRAN) 8 MG tablet   Oral   Take 8 mg by mouth every 8 (eight) hours as needed for nausea or vomiting.         Marland Kitchen oxyCODONE-acetaminophen (PERCOCET/ROXICET) 5-325 MG per tablet   Oral   Take 1-2 tablets by mouth every 4 (four) hours as needed for severe pain. Patient not taking: Reported on 05/20/2014   30 tablet   0   . potassium chloride SA (K-DUR,KLOR-CON) 20 MEQ tablet   Oral   Take 1 tablet (20 mEq total) by mouth 2 (two) times daily.   60 tablet   1   . predniSONE (DELTASONE) 10 MG tablet      Take 4 tabs  daily with food x 4 days, then 3 tabs daily x 4 days, then 2 tabs daily x 4 days, then 1 tab daily x4 days then stop. #40 Patient not taking: Reported on 05/06/2014   40 tablet   0   . promethazine (PHENERGAN) 25 MG tablet   Oral   Take 1 tablet by mouth every 6 (six) hours as needed. For nausea and vomiting.      0   .  scopolamine (TRANSDERM-SCOP) 1 MG/3DAYS   Transdermal   Place 1 patch (1.5 mg total) onto the skin every 3 (three) days.   30 patch   0   . senna-docusate  (SENOKOT-S) 8.6-50 MG per tablet   Oral   Take 1 tablet by mouth at bedtime as needed for moderate constipation.         Marland Kitchen zolpidem (AMBIEN) 5 MG tablet   Oral   Take 1 tablet (5 mg total) by mouth at bedtime as needed for sleep (insomnia). Patient not taking: Reported on 05/16/2014   30 tablet   0     Allergies No known allergies  Family History  Problem Relation Age of Onset  . Coronary artery disease Mother 67  . Lung cancer Mother   . Prostate cancer Father     Social History History  Substance Use Topics  . Smoking status: Former Research scientist (life sciences)  . Smokeless tobacco: Never Used  . Alcohol Use: Yes     Comment: occasionally has a drink    Review of Systems  Constitutional: No fever or chills. No weight changes Eyes:No blurry vision or double vision.  ENT: No sore throat. Cardiovascular: Positive chest pain  Respiratory: Positive dyspnea and cough. Gastrointestinal: Negative for abdominal pain, vomiting and diarrhea.  No BRBPR or melena. Genitourinary: Negative for dysuria, urinary retention, bloody urine, or difficulty urinating. Musculoskeletal: Negative for back pain. No joint swelling or pain. Skin: Negative for rash. Neurological: Negative for headaches, focal weakness or numbness. Psychiatric:No anxiety or depression.   Endocrine:No hot/cold intolerance, changes in energy, or sleep difficulty.  10-point ROS otherwise negative.  ____________________________________________   PHYSICAL EXAM:  VITAL SIGNS: ED Triage Vitals  Enc Vitals Group     BP 06/09/14 1139 132/95 mmHg     Pulse Rate 06/09/14 1139 111     Resp 06/09/14 1139 20     Temp 06/09/14 1139 98.2 F (36.8 C)     Temp Source 06/09/14 1139 Oral     SpO2 06/09/14 1139 95 %     Weight 06/09/14 1139 150 lb (68.04 kg)     Height 06/09/14 1139 '5\' 6"'$  (1.676 m)     Head Cir --      Peak Flow --      Pain Score 06/09/14 1140 7     Pain Loc --      Pain Edu? --      Excl. in [place]? --       Constitutional: Alert and oriented. Well appearing and in no distress. Eyes: No scleral icterus. No conjunctival pallor. PERRL. EOMI ENT   Head: Normocephalic and atraumatic.   Nose: No congestion/rhinnorhea. No septal hematoma   Mouth/Throat: MMM, no pharyngeal erythema. No peritonsillar mass. No uvula shift.   Neck: No stridor. No SubQ emphysema. No meningismus. Hematological/Lymphatic/Immunilogical: No cervical lymphadenopathy. Cardiovascular: RRR. Normal and symmetric distal pulses are present in all extremities. No murmurs, rubs, or gallops. Respiratory: Normal respiratory effort without tachypnea nor retractions. Breath sounds are clear and equal bilaterally. No wheezes/rales/rhonchi. Gastrointestinal: Soft and nontender. No distention. There is no CVA tenderness.  No rebound, rigidity, or guarding. Genitourinary: deferred Musculoskeletal: Nontender with normal range of motion in all extremities. No joint effusions.  No lower extremity tenderness.  No edema. Neurologic:   Normal speech and language.  CN 2-10 normal. Motor grossly intact. No pronator drift.  Normal gait. No gross focal neurologic deficits are appreciated.  Skin:  Skin is warm, dry and intact. No rash noted.  No petechiae, purpura, or bullae. Psychiatric: Mood and affect are normal. Speech and behavior are normal. Patient exhibits appropriate insight and judgment.  ____________________________________________    LABS (pertinent positives/negatives) (all labs ordered are listed, but only abnormal results are displayed) Labs Reviewed  BASIC METABOLIC PANEL - Abnormal; Notable for the following:    Potassium 3.3 (*)    Chloride 94 (*)    Glucose, Bld 147 (*)    BUN 26 (*)    All other components within normal limits  HEPATIC FUNCTION PANEL - Abnormal; Notable for the following:    Bilirubin, Direct <0.1 (*)    All other components within normal limits  LIPASE, BLOOD - Abnormal; Notable for  the following:    Lipase 62 (*)    All other components within normal limits  CBC WITH DIFFERENTIAL/PLATELET - Abnormal; Notable for the following:    WBC 11.5 (*)    RDW 19.1 (*)    Neutro Abs 9.9 (*)    Lymphs Abs 0.7 (*)    Basophils Absolute 0.2 (*)    All other components within normal limits  TROPONIN I  URINALYSIS COMPLETEWITH MICROSCOPIC ([facility] ONLY)   ____________________________________________   EKG  Interpreted by me Sinus tachycardia rate 108, normal axis and intervals, incomplete right bundle-branch block, normal ST segments and T waves.  ____________________________________________    RADIOLOGY  CT angiography rules out PE, but there is interstitial prominence concerning for progression of lymphangitic tumor  ____________________________________________   PROCEDURES  ____________________________________________   INITIAL IMPRESSION / ASSESSMENT AND PLAN / ED COURSE  Pertinent labs & imaging results that were available during my care of the patient were reviewed by me and considered in my medical decision making (see chart for details).  Exam unremarkable. Initial tachycardia in triage and on EKG seems to have resolved at rest in the treatment room. Due to her lung cancer and prior DVT, we'll repeat a CT angiogram of the chest to rule out PE.  ----------------------------------------- 3:17 PM on 06/09/2014 -----------------------------------------  Workup unremarkable. CT scan does reveal evidence of progression of the patient's advanced lung cancer. I discussed her care with Dr. Levora Dredge from oncology, who notes that the patient has a follow-up appointment with Dr. Grayland Ormond tomorrow and would not change anything until then. We'll discharge the patient and have her follow up with oncology at her appointment tomorrow. She is medically stable, well-appearing, no acute distress at this time.  ____________________________________________   FINAL CLINICAL  IMPRESSION(S) / ED DIAGNOSES  Final diagnoses:  Shortness of breath  Pleuritic chest pain      Carrie Mew, MD 06/09/14 [phone]

## 2014-06-09 NOTE — ED Notes (Signed)
Pt reports since yesterday the pt has had shortness of breath and mild cough. Pt states she was dx with dvt three weeks ago, has been on Eliquis.

## 2014-06-10 ENCOUNTER — Encounter (INDEPENDENT_AMBULATORY_CARE_PROVIDER_SITE_OTHER): Payer: Self-pay

## 2014-06-10 ENCOUNTER — Inpatient Hospital Stay (HOSPITAL_BASED_OUTPATIENT_CLINIC_OR_DEPARTMENT_OTHER): Payer: Medicaid Other | Admitting: Oncology

## 2014-06-10 ENCOUNTER — Ambulatory Visit: Payer: Medicaid Other

## 2014-06-10 ENCOUNTER — Inpatient Hospital Stay: Payer: Medicaid Other | Attending: Oncology | Admitting: *Deleted

## 2014-06-10 VITALS — BP 106/64 | HR 92 | Temp 97.1°F | Wt 157.4 lb

## 2014-06-10 DIAGNOSIS — C3491 Malignant neoplasm of unspecified part of right bronchus or lung: Secondary | ICD-10-CM

## 2014-06-10 DIAGNOSIS — Z5111 Encounter for antineoplastic chemotherapy: Secondary | ICD-10-CM | POA: Diagnosis present

## 2014-06-10 DIAGNOSIS — R609 Edema, unspecified: Secondary | ICD-10-CM

## 2014-06-10 DIAGNOSIS — C7951 Secondary malignant neoplasm of bone: Secondary | ICD-10-CM | POA: Diagnosis not present

## 2014-06-10 DIAGNOSIS — C3490 Malignant neoplasm of unspecified part of unspecified bronchus or lung: Secondary | ICD-10-CM | POA: Insufficient documentation

## 2014-06-10 DIAGNOSIS — Z801 Family history of malignant neoplasm of trachea, bronchus and lung: Secondary | ICD-10-CM | POA: Diagnosis not present

## 2014-06-10 DIAGNOSIS — R0602 Shortness of breath: Secondary | ICD-10-CM | POA: Diagnosis not present

## 2014-06-10 DIAGNOSIS — J849 Interstitial pulmonary disease, unspecified: Secondary | ICD-10-CM | POA: Insufficient documentation

## 2014-06-10 DIAGNOSIS — Z86718 Personal history of other venous thrombosis and embolism: Secondary | ICD-10-CM | POA: Insufficient documentation

## 2014-06-10 DIAGNOSIS — Z87891 Personal history of nicotine dependence: Secondary | ICD-10-CM | POA: Diagnosis not present

## 2014-06-10 DIAGNOSIS — Z809 Family history of malignant neoplasm, unspecified: Secondary | ICD-10-CM

## 2014-06-10 DIAGNOSIS — Z79899 Other long term (current) drug therapy: Secondary | ICD-10-CM

## 2014-06-10 DIAGNOSIS — I959 Hypotension, unspecified: Secondary | ICD-10-CM | POA: Insufficient documentation

## 2014-06-10 LAB — CBC WITH DIFFERENTIAL/PLATELET
Basophils Absolute: 0.1 10*3/uL (ref 0–0.1)
Basophils Relative: 1 %
Eosinophils Absolute: 0 10*3/uL (ref 0–0.7)
Eosinophils Relative: 0 %
HCT: 36.9 % (ref 35.0–47.0)
HEMOGLOBIN: 12.1 g/dL (ref 12.0–16.0)
LYMPHS PCT: 8 %
Lymphs Abs: 0.7 10*3/uL — ABNORMAL LOW (ref 1.0–3.6)
MCH: 27.1 pg (ref 26.0–34.0)
MCHC: 32.7 g/dL (ref 32.0–36.0)
MCV: 83 fL (ref 80.0–100.0)
Monocytes Absolute: 0.5 10*3/uL (ref 0.2–0.9)
Monocytes Relative: 6 %
NEUTROS ABS: 7.6 10*3/uL — AB (ref 1.4–6.5)
Neutrophils Relative %: 85 %
Platelets: 205 10*3/uL (ref 150–440)
RBC: 4.45 MIL/uL (ref 3.80–5.20)
RDW: 19.1 % — ABNORMAL HIGH (ref 11.5–14.5)
WBC: 9 10*3/uL (ref 3.6–11.0)

## 2014-06-10 LAB — COMPREHENSIVE METABOLIC PANEL
ALBUMIN: 3.5 g/dL (ref 3.5–5.0)
ALK PHOS: 66 U/L (ref 38–126)
ALT: 26 U/L (ref 14–54)
AST: 22 U/L (ref 15–41)
Anion gap: 8 (ref 5–15)
BILIRUBIN TOTAL: 0.6 mg/dL (ref 0.3–1.2)
BUN: 30 mg/dL — ABNORMAL HIGH (ref 6–20)
CO2: 27 mmol/L (ref 22–32)
Calcium: 8 mg/dL — ABNORMAL LOW (ref 8.9–10.3)
Chloride: 97 mmol/L — ABNORMAL LOW (ref 101–111)
Creatinine, Ser: 0.94 mg/dL (ref 0.44–1.00)
GFR calc Af Amer: >60 mL/min (ref >60)
GFR calc non Af Amer: >60 mL/min (ref >60)
GLUCOSE: 175 mg/dL — AB (ref 65–99)
Potassium: 3.1 mmol/L — ABNORMAL LOW (ref 3.5–5.1)
SODIUM: 132 mmol/L — AB (ref 135–145)
Total Protein: 5.9 g/dL — ABNORMAL LOW (ref 6.5–8.1)

## 2014-06-10 LAB — MAGNESIUM: Magnesium: 2 mg/dL (ref 1.7–2.4)

## 2014-06-10 MED ORDER — SODIUM CHLORIDE 0.9 % IV SOLN
3.0000 mg/kg | Freq: Once | INTRAVENOUS | Status: AC
  Administered 2014-06-10: 220 mg via INTRAVENOUS
  Filled 2014-06-10: qty 16

## 2014-06-10 MED ORDER — HEPARIN SOD (PORK) LOCK FLUSH 100 UNIT/ML IV SOLN
500.0000 [IU] | Freq: Once | INTRAVENOUS | Status: AC | PRN
  Administered 2014-06-10: 500 [IU]

## 2014-06-10 MED ORDER — SODIUM CHLORIDE 0.9 % IV SOLN
Freq: Once | INTRAVENOUS | Status: AC
  Administered 2014-06-10: 15:00:00 via INTRAVENOUS
  Filled 2014-06-10: qty 250

## 2014-06-10 MED ORDER — HEPARIN SOD (PORK) LOCK FLUSH 100 UNIT/ML IV SOLN
250.0000 [IU] | Freq: Once | INTRAVENOUS | Status: DC | PRN
  Filled 2014-06-10: qty 5

## 2014-06-10 MED ORDER — HYDROMORPHONE HCL 4 MG PO TABS: 4.0000 mg | ORAL_TABLET | ORAL | Status: DC | PRN

## 2014-06-10 MED ORDER — FENTANYL 100 MCG/HR TD PT72: 100.0000 ug | MEDICATED_PATCH | TRANSDERMAL | Status: DC

## 2014-06-10 MED ORDER — FENTANYL 25 MCG/HR TD PT72: 25.0000 ug | MEDICATED_PATCH | TRANSDERMAL | Status: DC

## 2014-06-10 MED ORDER — SODIUM CHLORIDE 0.9 % IJ SOLN
10.0000 mL | INTRAMUSCULAR | Status: DC | PRN
  Administered 2014-06-10: 10 mL
  Filled 2014-06-10: qty 10

## 2014-06-10 MED ORDER — PROMETHAZINE HCL 25 MG PO TABS: 25.0000 mg | ORAL_TABLET | Freq: Four times a day (QID) | ORAL | Status: DC | PRN

## 2014-06-15 ENCOUNTER — Other Ambulatory Visit: Payer: Self-pay | Admitting: Oncology

## 2014-06-16 NOTE — Progress Notes (Signed)
[place]  [phone] [phone]) [phone]  ID: [name] OB: 08/23/1960  MR#: [id number]  GGE#:[id number]  Patient Care Team: Pcp Not In System as PCP - General  CHIEF COMPLAINT:  Chief Complaint  Patient presents with  . Follow-up    lung cancer    INTERVAL HISTORY: Patient returns to clinic today for further evaluation and consideration of her next infusion of nivolumab.  She recently was seen in the emergency room for increased shortness of breath as well as a variety of other medical complaints. Patient did not require admission at that time.  She continues to have mild bilateral peripheral edema, which is essentially unchanged. She otherwise feels well and is asymptomatic. She has no neurologic complaints.  Her pain is well-controlled on her current narcotic regimen. She does not complain of fevers today.  She denies any nausea, vomiting, constipation, or diarrhea. Patient offers no further specific complaints today.   REVIEW OF SYSTEMS:   Review of Systems  Respiratory: Positive for shortness of breath.   Gastrointestinal: Positive for nausea.  Neurological: Positive for weakness.    As per HPI. Otherwise, a complete review of systems is negatve.  PAST MEDICAL HISTORY: Past Medical History  Diagnosis Date  . Pneumonia   . Lung cancer   . DVT (deep venous thrombosis)     PAST SURGICAL HISTORY: Past Surgical History  Procedure Laterality Date  . Video bronchoscopy Bilateral 01/14/2013    Procedure: VIDEO BRONCHOSCOPY WITHOUT FLUORO;  Surgeon: Wilhelmina Mcardle, MD;  Location: [facility] ENDOSCOPY;  Service: Cardiopulmonary;  Laterality: Bilateral;  . Video bronchoscopy N/A 01/27/2013    Procedure: VIDEO BRONCHOSCOPY;  Surgeon: Ivin Poot, MD;  Location: [place];  Service: Thoracic;  Laterality: N/A;  . Video assisted thoracoscopy Right 01/27/2013    Procedure: VIDEO ASSISTED THORACOSCOPY;  Surgeon: Ivin Poot, MD;  Location: [place];   Service: Thoracic;  Laterality: Right;  . Lung biopsy Right 01/27/2013    Procedure: LUNG BIOPSY;  Surgeon: Ivin Poot, MD;  Location: [place];  Service: Thoracic;  Laterality: Right;  . Pericardial tap N/A 01/11/2013    Procedure: PERICARDIAL TAP;  Surgeon: Blane Ohara, MD;  Location: [facility] CATH LAB;  Service: Cardiovascular;  Laterality: N/A;    FAMILY HISTORY Family History  Problem Relation Age of Onset  . Coronary artery disease Mother 86  . Lung cancer Mother   . Prostate cancer Father        ADVANCED DIRECTIVES:    HEALTH MAINTENANCE: History  Substance Use Topics  . Smoking status: Former Research scientist (life sciences)  . Smokeless tobacco: Never Used  . Alcohol Use: Yes     Comment: occasionally has a drink     Colonoscopy:  PAP:  Bone density:  Lipid panel:  Allergies  Allergen Reactions  . No Known Allergies     Current Outpatient Prescriptions  Medication Sig Dispense Refill  . apixaban (ELIQUIS) 5 MG TABS tablet Take 1 tablet (5 mg total) by mouth 2 (two) times daily. 60 tablet 0  . conjugated estrogens (PREMARIN) vaginal cream 1gm pv qhs x 2 weeks, then two times per week x 2 weeks, then weekly for maintenance    . dexamethasone (DECADRON) 4 MG tablet Take 4 mg by mouth every 12 (twelve) hours.    . diphenhydrAMINE (BENADRYL) 25 mg capsule Take 50 mg by mouth daily as needed for allergies.    Marland Kitchen dronabinol (MARINOL) 5 MG capsule Take 5 mg by mouth 2 (two)  times daily before a meal.    . fentaNYL (DURAGESIC - DOSED MCG/HR) 100 MCG/HR Place 1 patch (100 mcg total) onto the skin every 3 (three) days. 10 patch 0  . fentaNYL (DURAGESIC - DOSED MCG/HR) 25 MCG/HR patch Place 1 patch (25 mcg total) onto the skin every 3 (three) days. 10 patch 0  . furosemide (LASIX) 40 MG tablet Take 1 tablet (40 mg total) by mouth 2 (two) times daily. 180 tablet 0  . gabapentin (NEURONTIN) 300 MG capsule Take 1 capsule (300 mg total) by mouth 3 (three) times daily. 90 capsule 1  . haloperidol  (HALDOL) 5 MG tablet Take 1 tablet (5 mg total) by mouth every 8 (eight) hours as needed for agitation. As needed for vomiting not improved by nausea medications 20 tablet 0  . HYDROmorphone (DILAUDID) 4 MG tablet Take 1 tablet (4 mg total) by mouth every 4 (four) hours as needed for severe pain. 90 tablet 0  . Ipratropium-Albuterol (COMBIVENT) 20-100 MCG/ACT AERS respimat Inhale 1 puff into the lungs 4 (four) times daily as needed. For shortness of breath and wheezing 1 Inhaler 1  . metoCLOPramide (REGLAN) 5 MG tablet Take 1 tablet by mouth 4 (four) times daily.  0  . metoprolol tartrate (LOPRESSOR) 25 MG tablet Take 1 tablet (25 mg total) by mouth 3 (three) times daily. 90 tablet 1  . omeprazole (PRILOSEC) 20 MG capsule Take 20 mg by mouth daily.    . ondansetron (ZOFRAN) 8 MG tablet Take 8 mg by mouth every 8 (eight) hours as needed for nausea or vomiting.    Marland Kitchen oxyCODONE-acetaminophen (PERCOCET/ROXICET) 5-325 MG per tablet Take 1-2 tablets by mouth every 4 (four) hours as needed for severe pain. 30 tablet 0  . potassium chloride SA (K-DUR,KLOR-CON) 20 MEQ tablet Take 1 tablet (20 mEq total) by mouth 2 (two) times daily. 60 tablet 1  . promethazine (PHENERGAN) 25 MG tablet Take 1 tablet (25 mg total) by mouth every 6 (six) hours as needed. For nausea and vomiting. 45 tablet 2  . scopolamine (TRANSDERM-SCOP) 1 MG/3DAYS Place 1 patch (1.5 mg total) onto the skin every 3 (three) days. 30 patch 0  . senna-docusate (SENOKOT-S) 8.6-50 MG per tablet Take 1 tablet by mouth at bedtime as needed for moderate constipation.     No current facility-administered medications for this visit.   Facility-Administered Medications Ordered in Other Visits  Medication Dose Route Frequency Provider Last Rate Last Dose  . sodium chloride 0.9 % injection 10 mL  10 mL Intracatheter PRN Lloyd Huger, MD   10 mL at 05/06/14 1435    OBJECTIVE: Filed Vitals:   06/10/14 1625  BP: 106/64  Pulse: 92  Temp: 97.1 F  (36.2 C)     Body mass index is 25.42 kg/(m^2).    ECOG FS:0 - Asymptomatic  General: Well-developed, well-nourished, no acute distress. Eyes: anicteric sclera. Lungs: Clear to auscultation bilaterally. Heart: Regular rate and rhythm. No rubs, murmurs, or gallops. Abdomen: Soft, nontender, nondistended. No organomegaly noted, normoactive bowel sounds. Musculoskeletal: 1+ bilateral lower extremity edema Neuro: Alert, answering all questions appropriately. Cranial nerves grossly intact. Skin: No rashes or petechiae noted. Psych: Normal affect.    LAB RESULTS:  Lab Results  Component Value Date   NA 132* 06/10/2014   K 3.1* 06/10/2014   CL 97* 06/10/2014   CO2 27 06/10/2014   GLUCOSE 175* 06/10/2014   BUN 30* 06/10/2014   CREATININE 0.94 06/10/2014   CALCIUM 8.0* 06/10/2014  PROT 5.9* 06/10/2014   ALBUMIN 3.5 06/10/2014   AST 22 06/10/2014   ALT 26 06/10/2014   ALKPHOS 66 06/10/2014   BILITOT 0.6 06/10/2014   GFRNONAA >60 06/10/2014   GFRAA >60 06/10/2014    Lab Results  Component Value Date   WBC 9.0 06/10/2014   NEUTROABS 7.6* 06/10/2014   HGB 12.1 06/10/2014   HCT 36.9 06/10/2014   MCV 83.0 06/10/2014   PLT 205 06/10/2014     STUDIES: Ct Angio Chest Pe W/cm &/or Wo Cm  06/09/2014   CLINICAL DATA:  Productive cough.  Short of breath.  EXAM: CT ANGIOGRAPHY CHEST WITH CONTRAST  TECHNIQUE: Multidetector CT imaging of the chest was performed using the standard protocol during bolus administration of intravenous contrast. Multiplanar CT image reconstructions and MIPs were obtained to evaluate the vascular anatomy.  CONTRAST:  176m OMNIPAQUE IOHEXOL 350 MG/ML SOLN  COMPARISON:  03/06/2014  FINDINGS: There are no filling defects in the pulmonary arterial tree to suggest acute pulmonary thromboembolism.  Normal appearance of the aorta. No evidence of dissection or intramural hematoma. Great vessels are patent within the confines of the examination including the vertebral  arteries bilaterally.  Right jugular Port-A-Cath device is in place. Tip is at the cavoatrial junction.  Peribronchovascular soft tissue thickening in the hilar regions is unchanged likely related to lymphangitic tumor. Interstitial lung disease characterize by nodular interlobular septal thickening has progressed. There probably is some associated ground-glass and micro nodules. Right upper lobe scarring with calcification.  No pneumothorax.  Bilateral pleural effusions have resolved.  Sclerotic bone lesions are stable. This includes the C6 vertebral body.  Review of the MIP images confirms the above findings.  IMPRESSION: No evidence acute pulmonary thromboembolism.  Bilateral pleural effusions have resolved.  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   Electronically Signed   By: AMarybelle KillingsM.D.   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  EGFR and ALK negative.  PLAN:    1.  Lung cancer: Pathology results from open lung biopsy revealed clear lymphangitic spread of tumor. CT scan results reviewed independently and reported as above with concern of progression of disease. Despite this, we will proceed with cycle 10 of 324mkg nivolumab. Patient last received Zometa on November 17, 2013. Return to clinic in 2 weeks for consideration of cycle 11. Will reimage in August or September 2016.   2. Pain: Continue current narcotic regimen, patient does not wish to change at this time. 3. Poor appetite/nausea/dehydration: Improved. Continue anti-emetic regimen. 4. Dizziness: Resolved. 5. Hypotension: Improved, monitor. 6  Thrombocytopenia: Resoved. Monitor. 7. Hypokalemia: Resolved. Continue oral potassium supplementation as ordered. 8. Peripheral edema: Continue Lasix to 40 mg twice a day. 9. Shortness of breath: Possibly secondary to progression of disease. Continue treatment as above.  Patient expressed understanding  and was in agreement with this plan. She also understands that She can call clinic at any time with any questions, concerns, or complaints.   Adenocarcinoma of lung, stage 4   Staging form: Lung, AJCC 7th Edition     Clinical stage from 04/26/2014: T4, N0, M1 - Signed by TiLloyd HugerMD on 04/26/2014   TiLloyd HugerMD   06/16/2014 2:27 PM

## 2014-06-22 ENCOUNTER — Encounter: Payer: Self-pay | Admitting: Urgent Care

## 2014-06-22 ENCOUNTER — Inpatient Hospital Stay
Admission: EM | Admit: 2014-06-22 | Discharge: 2014-06-28 | DRG: 871 | Disposition: A | Discharge status: Home / Self Care | Payer: Medicaid Other | Attending: Internal Medicine | Admitting: Internal Medicine

## 2014-06-22 ENCOUNTER — Emergency Department: Payer: Medicaid Other

## 2014-06-22 DIAGNOSIS — G894 Chronic pain syndrome: Secondary | ICD-10-CM | POA: Diagnosis present

## 2014-06-22 DIAGNOSIS — Z7901 Long term (current) use of anticoagulants: Secondary | ICD-10-CM

## 2014-06-22 DIAGNOSIS — I5032 Chronic diastolic (congestive) heart failure: Secondary | ICD-10-CM | POA: Diagnosis present

## 2014-06-22 DIAGNOSIS — Z79891 Long term (current) use of opiate analgesic: Secondary | ICD-10-CM

## 2014-06-22 DIAGNOSIS — Z803 Family history of malignant neoplasm of breast: Secondary | ICD-10-CM | POA: Diagnosis not present

## 2014-06-22 DIAGNOSIS — R112 Nausea with vomiting, unspecified: Secondary | ICD-10-CM

## 2014-06-22 DIAGNOSIS — Z87891 Personal history of nicotine dependence: Secondary | ICD-10-CM | POA: Diagnosis not present

## 2014-06-22 DIAGNOSIS — C3490 Malignant neoplasm of unspecified part of unspecified bronchus or lung: Secondary | ICD-10-CM | POA: Diagnosis not present

## 2014-06-22 DIAGNOSIS — C779 Secondary and unspecified malignant neoplasm of lymph node, unspecified: Secondary | ICD-10-CM | POA: Diagnosis present

## 2014-06-22 DIAGNOSIS — R0602 Shortness of breath: Secondary | ICD-10-CM

## 2014-06-22 DIAGNOSIS — Z9221 Personal history of antineoplastic chemotherapy: Secondary | ICD-10-CM

## 2014-06-22 DIAGNOSIS — Z801 Family history of malignant neoplasm of trachea, bronchus and lung: Secondary | ICD-10-CM | POA: Diagnosis not present

## 2014-06-22 DIAGNOSIS — E876 Hypokalemia: Secondary | ICD-10-CM

## 2014-06-22 DIAGNOSIS — Z66 Do not resuscitate: Secondary | ICD-10-CM | POA: Diagnosis not present

## 2014-06-22 DIAGNOSIS — R079 Chest pain, unspecified: Secondary | ICD-10-CM | POA: Diagnosis present

## 2014-06-22 DIAGNOSIS — Y95 Nosocomial condition: Secondary | ICD-10-CM | POA: Diagnosis present

## 2014-06-22 DIAGNOSIS — A419 Sepsis, unspecified organism: Secondary | ICD-10-CM | POA: Diagnosis present

## 2014-06-22 DIAGNOSIS — Z9981 Dependence on supplemental oxygen: Secondary | ICD-10-CM

## 2014-06-22 DIAGNOSIS — Z8249 Family history of ischemic heart disease and other diseases of the circulatory system: Secondary | ICD-10-CM

## 2014-06-22 DIAGNOSIS — Z79899 Other long term (current) drug therapy: Secondary | ICD-10-CM | POA: Diagnosis not present

## 2014-06-22 DIAGNOSIS — I82409 Acute embolism and thrombosis of unspecified deep veins of unspecified lower extremity: Secondary | ICD-10-CM | POA: Diagnosis present

## 2014-06-22 DIAGNOSIS — I10 Essential (primary) hypertension: Secondary | ICD-10-CM

## 2014-06-22 DIAGNOSIS — Z86718 Personal history of other venous thrombosis and embolism: Secondary | ICD-10-CM

## 2014-06-22 DIAGNOSIS — Z85118 Personal history of other malignant neoplasm of bronchus and lung: Secondary | ICD-10-CM

## 2014-06-22 DIAGNOSIS — J9601 Acute respiratory failure with hypoxia: Secondary | ICD-10-CM | POA: Diagnosis not present

## 2014-06-22 DIAGNOSIS — C7951 Secondary malignant neoplasm of bone: Secondary | ICD-10-CM

## 2014-06-22 DIAGNOSIS — J189 Pneumonia, unspecified organism: Secondary | ICD-10-CM | POA: Diagnosis not present

## 2014-06-22 DIAGNOSIS — K219 Gastro-esophageal reflux disease without esophagitis: Secondary | ICD-10-CM

## 2014-06-22 DIAGNOSIS — J810 Acute pulmonary edema: Secondary | ICD-10-CM

## 2014-06-22 DIAGNOSIS — Z515 Encounter for palliative care: Secondary | ICD-10-CM

## 2014-06-22 DIAGNOSIS — R0902 Hypoxemia: Secondary | ICD-10-CM

## 2014-06-22 DIAGNOSIS — I248 Other forms of acute ischemic heart disease: Secondary | ICD-10-CM | POA: Diagnosis present

## 2014-06-22 HISTORY — DX: Essential (primary) hypertension: I10

## 2014-06-22 HISTORY — DX: Unspecified urinary incontinence: R32

## 2014-06-22 HISTORY — DX: Chronic diastolic (congestive) heart failure: I50.32

## 2014-06-22 LAB — URINALYSIS COMPLETE WITH MICROSCOPIC (ARMC ONLY)
Bacteria, UA: NONE SEEN
Bilirubin Urine: NEGATIVE
Glucose, UA: NEGATIVE mg/dL
HGB URINE DIPSTICK: NEGATIVE
Nitrite: NEGATIVE
PH: 7 (ref 5.0–8.0)
Protein, ur: NEGATIVE mg/dL
Specific Gravity, Urine: 1.005 (ref 1.005–1.030)

## 2014-06-22 LAB — COMPREHENSIVE METABOLIC PANEL
ALBUMIN: 2.5 g/dL — AB (ref 3.5–5.0)
ALK PHOS: 65 U/L (ref 38–126)
ALT: 20 U/L (ref 14–54)
AST: 20 U/L (ref 15–41)
Anion gap: 11 (ref 5–15)
BUN: 13 mg/dL (ref 6–20)
CHLORIDE: 95 mmol/L — AB (ref 101–111)
CO2: 30 mmol/L (ref 22–32)
Calcium: 7.6 mg/dL — ABNORMAL LOW (ref 8.9–10.3)
Creatinine, Ser: 0.7 mg/dL (ref 0.44–1.00)
GFR calc Af Amer: >60 mL/min (ref >60)
GFR calc non Af Amer: >60 mL/min (ref >60)
Glucose, Bld: 108 mg/dL — ABNORMAL HIGH (ref 65–99)
POTASSIUM: 2.5 mmol/L — AB (ref 3.5–5.1)
SODIUM: 136 mmol/L (ref 135–145)
Total Bilirubin: 1.1 mg/dL (ref 0.3–1.2)
Total Protein: 5.3 g/dL — ABNORMAL LOW (ref 6.5–8.1)

## 2014-06-22 LAB — CBC
HCT: 32 % — ABNORMAL LOW (ref 35.0–47.0)
HCT: 32.9 % — ABNORMAL LOW (ref 35.0–47.0)
Hemoglobin: 10.3 g/dL — ABNORMAL LOW (ref 12.0–16.0)
Hemoglobin: 10.7 g/dL — ABNORMAL LOW (ref 12.0–16.0)
MCH: 27.4 pg (ref 26.0–34.0)
MCH: 27.5 pg (ref 26.0–34.0)
MCHC: 32.3 g/dL (ref 32.0–36.0)
MCHC: 32.5 g/dL (ref 32.0–36.0)
MCV: 85 fL (ref 80.0–100.0)
MCV: 84.8 fL (ref 80.0–100.0)
PLATELETS: 189 10*3/uL (ref 150–440)
Platelets: 163 10*3/uL (ref 150–440)
RBC: 3.77 MIL/uL — AB (ref 3.80–5.20)
RBC: 3.88 MIL/uL (ref 3.80–5.20)
RDW: 22.2 % — ABNORMAL HIGH (ref 11.5–14.5)
RDW: 22 % — ABNORMAL HIGH (ref 11.5–14.5)
WBC: 4.7 10*3/uL (ref 3.6–11.0)
WBC: 4.2 10*3/uL (ref 3.6–11.0)

## 2014-06-22 LAB — BASIC METABOLIC PANEL
Anion gap: 7 (ref 5–15)
BUN: 9 mg/dL (ref 6–20)
CALCIUM: 7.3 mg/dL — AB (ref 8.9–10.3)
CO2: 33 mmol/L — AB (ref 22–32)
Chloride: 94 mmol/L — ABNORMAL LOW (ref 101–111)
Creatinine, Ser: 0.66 mg/dL (ref 0.44–1.00)
GFR calc Af Amer: >60 mL/min (ref >60)
GFR calc non Af Amer: >60 mL/min (ref >60)
GLUCOSE: 117 mg/dL — AB (ref 65–99)
Potassium: 2.8 mmol/L — CL (ref 3.5–5.1)
Sodium: 134 mmol/L — ABNORMAL LOW (ref 135–145)

## 2014-06-22 LAB — TROPONIN I
Troponin I: <0.03 ng/mL (ref <0.031)
Troponin I: <0.03 ng/mL (ref <0.031)

## 2014-06-22 LAB — MAGNESIUM: Magnesium: 1.5 mg/dL — ABNORMAL LOW (ref 1.7–2.4)

## 2014-06-22 LAB — LACTIC ACID, PLASMA: LACTIC ACID, VENOUS: 0.9 mmol/L (ref 0.5–2.0)

## 2014-06-22 LAB — BRAIN NATRIURETIC PEPTIDE: B Natriuretic Peptide: 114 pg/mL — ABNORMAL HIGH (ref 0.0–100.0)

## 2014-06-22 MED ORDER — ONDANSETRON HCL 4 MG/2ML IJ SOLN
INTRAMUSCULAR | Status: AC
  Administered 2014-06-22: 4 mg via INTRAVENOUS
  Filled 2014-06-22: qty 2

## 2014-06-22 MED ORDER — OXYCODONE-ACETAMINOPHEN 5-325 MG PO TABS: 1.0000 | ORAL_TABLET | ORAL | Status: DC | PRN

## 2014-06-22 MED ORDER — IPRATROPIUM-ALBUTEROL 0.5-2.5 (3) MG/3ML IN SOLN
3.0000 mL | RESPIRATORY_TRACT | Status: DC
  Administered 2014-06-22 – 2014-06-28 (×29): 3 mL via RESPIRATORY_TRACT
  Filled 2014-06-22 (×34): qty 3

## 2014-06-22 MED ORDER — ONDANSETRON HCL 4 MG/2ML IJ SOLN
4.0000 mg | Freq: Four times a day (QID) | INTRAMUSCULAR | Status: DC | PRN
  Administered 2014-06-22 – 2014-06-28 (×18): 4 mg via INTRAVENOUS
  Filled 2014-06-22 (×21): qty 2

## 2014-06-22 MED ORDER — METOPROLOL TARTRATE 25 MG PO TABS
25.0000 mg | ORAL_TABLET | Freq: Two times a day (BID) | ORAL | Status: DC
  Administered 2014-06-22 – 2014-06-28 (×10): 25 mg via ORAL
  Filled 2014-06-22 (×11): qty 1

## 2014-06-22 MED ORDER — IPRATROPIUM-ALBUTEROL 20-100 MCG/ACT IN AERS: 1.0000 | INHALATION_SPRAY | Freq: Four times a day (QID) | RESPIRATORY_TRACT | Status: DC | PRN

## 2014-06-22 MED ORDER — POTASSIUM CHLORIDE CRYS ER 20 MEQ PO TBCR
40.0000 meq | EXTENDED_RELEASE_TABLET | Freq: Two times a day (BID) | ORAL | Status: AC
  Administered 2014-06-22 (×2): 40 meq via ORAL
  Filled 2014-06-22 (×2): qty 2

## 2014-06-22 MED ORDER — FUROSEMIDE 40 MG PO TABS
40.0000 mg | ORAL_TABLET | Freq: Two times a day (BID) | ORAL | Status: DC
  Administered 2014-06-22 – 2014-06-28 (×11): 40 mg via ORAL
  Filled 2014-06-22 (×12): qty 1

## 2014-06-22 MED ORDER — FUROSEMIDE 10 MG/ML IJ SOLN
INTRAMUSCULAR | Status: AC
  Administered 2014-06-22: 40 mg via INTRAVENOUS
  Filled 2014-06-22: qty 4

## 2014-06-22 MED ORDER — HYDROMORPHONE HCL 1 MG/ML IJ SOLN
INTRAMUSCULAR | Status: AC
  Administered 2014-06-22: 1 mg via INTRAVENOUS
  Filled 2014-06-22: qty 1

## 2014-06-22 MED ORDER — METHYLPREDNISOLONE SODIUM SUCC 40 MG IJ SOLR
40.0000 mg | Freq: Two times a day (BID) | INTRAMUSCULAR | Status: DC
  Administered 2014-06-22 – 2014-06-23 (×4): 40 mg via INTRAVENOUS
  Filled 2014-06-22 (×4): qty 1

## 2014-06-22 MED ORDER — ONDANSETRON HCL 4 MG PO TABS
8.0000 mg | ORAL_TABLET | Freq: Three times a day (TID) | ORAL | Status: DC | PRN
  Administered 2014-06-22: 8 mg via ORAL
  Filled 2014-06-22 (×3): qty 2

## 2014-06-22 MED ORDER — VANCOMYCIN HCL IN DEXTROSE 1-5 GM/200ML-% IV SOLN
1000.0000 mg | Freq: Two times a day (BID) | INTRAVENOUS | Status: DC
  Administered 2014-06-23 – 2014-06-24 (×4): 1000 mg via INTRAVENOUS
  Filled 2014-06-22 (×6): qty 200

## 2014-06-22 MED ORDER — HYDROMORPHONE HCL 1 MG/ML IJ SOLN
1.0000 mg | Freq: Once | INTRAMUSCULAR | Status: AC
  Administered 2014-06-22: 1 mg via INTRAVENOUS

## 2014-06-22 MED ORDER — MAGNESIUM SULFATE 2 GM/50ML IV SOLN
2.0000 g | Freq: Once | INTRAVENOUS | Status: AC
  Administered 2014-06-22: 2 g via INTRAVENOUS
  Filled 2014-06-22: qty 50

## 2014-06-22 MED ORDER — PANTOPRAZOLE SODIUM 40 MG PO TBEC
40.0000 mg | DELAYED_RELEASE_TABLET | Freq: Every day | ORAL | Status: DC
  Administered 2014-06-22 – 2014-06-28 (×7): 40 mg via ORAL
  Filled 2014-06-22 (×7): qty 1

## 2014-06-22 MED ORDER — SENNOSIDES-DOCUSATE SODIUM 8.6-50 MG PO TABS
1.0000 | ORAL_TABLET | Freq: Every evening | ORAL | Status: DC | PRN
  Filled 2014-06-22: qty 1

## 2014-06-22 MED ORDER — IPRATROPIUM-ALBUTEROL 0.5-2.5 (3) MG/3ML IN SOLN: 3.0000 mL | RESPIRATORY_TRACT | Status: DC | PRN

## 2014-06-22 MED ORDER — PIPERACILLIN-TAZOBACTAM 3.375 G IVPB
3.3750 g | Freq: Three times a day (TID) | INTRAVENOUS | Status: DC
  Administered 2014-06-22 – 2014-06-28 (×19): 3.375 g via INTRAVENOUS
  Filled 2014-06-22 (×22): qty 50

## 2014-06-22 MED ORDER — SODIUM CHLORIDE 0.9 % IJ SOLN
3.0000 mL | Freq: Two times a day (BID) | INTRAMUSCULAR | Status: DC
  Administered 2014-06-22 – 2014-06-28 (×9): 3 mL via INTRAVENOUS

## 2014-06-22 MED ORDER — SULFAMETHOXAZOLE-TRIMETHOPRIM 400-80 MG PO TABS
2.0000 | ORAL_TABLET | Freq: Two times a day (BID) | ORAL | Status: DC
  Administered 2014-06-22 – 2014-06-28 (×12): 2 via ORAL
  Filled 2014-06-22 (×13): qty 2

## 2014-06-22 MED ORDER — ACETAMINOPHEN 325 MG PO TABS
650.0000 mg | ORAL_TABLET | Freq: Four times a day (QID) | ORAL | Status: DC | PRN
  Administered 2014-06-22: 650 mg via ORAL
  Filled 2014-06-22: qty 2

## 2014-06-22 MED ORDER — FUROSEMIDE 10 MG/ML IJ SOLN
40.0000 mg | Freq: Once | INTRAMUSCULAR | Status: AC
  Administered 2014-06-22: 40 mg via INTRAVENOUS

## 2014-06-22 MED ORDER — PROMETHAZINE HCL 25 MG/ML IJ SOLN
12.5000 mg | Freq: Four times a day (QID) | INTRAMUSCULAR | Status: DC | PRN
  Administered 2014-06-22 (×2): 25 mg via INTRAVENOUS
  Administered 2014-06-22: 12.5 mg via INTRAVENOUS
  Administered 2014-06-23 – 2014-06-24 (×6): 25 mg via INTRAVENOUS
  Filled 2014-06-22 (×8): qty 1

## 2014-06-22 MED ORDER — POTASSIUM CHLORIDE 10 MEQ/100ML IV SOLN
10.0000 meq | INTRAVENOUS | Status: AC
  Administered 2014-06-22 (×4): 10 meq via INTRAVENOUS
  Filled 2014-06-22 (×4): qty 100

## 2014-06-22 MED ORDER — VANCOMYCIN HCL IN DEXTROSE 1-5 GM/200ML-% IV SOLN
1000.0000 mg | Freq: Once | INTRAVENOUS | Status: AC
  Administered 2014-06-22: 1000 mg via INTRAVENOUS
  Filled 2014-06-22: qty 200

## 2014-06-22 MED ORDER — FENTANYL 100 MCG/HR TD PT72
100.0000 ug | MEDICATED_PATCH | TRANSDERMAL | Status: DC
  Administered 2014-06-22 – 2014-06-28 (×3): 100 ug via TRANSDERMAL
  Filled 2014-06-22 (×4): qty 1

## 2014-06-22 MED ORDER — OXYCODONE HCL 5 MG PO TABS
5.0000 mg | ORAL_TABLET | ORAL | Status: DC | PRN
  Administered 2014-06-22 – 2014-06-25 (×8): 5 mg via ORAL
  Filled 2014-06-22 (×8): qty 1

## 2014-06-22 MED ORDER — SULFAMETHOXAZOLE-TRIMETHOPRIM 400-80 MG PO TABS
1.0000 | ORAL_TABLET | Freq: Two times a day (BID) | ORAL | Status: DC
  Filled 2014-06-22 (×2): qty 1

## 2014-06-22 MED ORDER — FENTANYL 25 MCG/HR TD PT72
25.0000 ug | MEDICATED_PATCH | TRANSDERMAL | Status: DC
  Administered 2014-06-22: 25 ug via TRANSDERMAL
  Filled 2014-06-22: qty 1

## 2014-06-22 MED ORDER — APIXABAN 5 MG PO TABS
5.0000 mg | ORAL_TABLET | Freq: Two times a day (BID) | ORAL | Status: DC
  Administered 2014-06-22 – 2014-06-28 (×13): 5 mg via ORAL
  Filled 2014-06-22 (×13): qty 1

## 2014-06-22 MED ORDER — PROMETHAZINE HCL 25 MG/ML IJ SOLN
INTRAMUSCULAR | Status: AC
  Administered 2014-06-22: 12.5 mg via INTRAVENOUS
  Filled 2014-06-22: qty 1

## 2014-06-22 MED ORDER — ONDANSETRON HCL 4 MG/2ML IJ SOLN
4.0000 mg | Freq: Once | INTRAMUSCULAR | Status: AC
  Administered 2014-06-22: 4 mg via INTRAVENOUS

## 2014-06-22 MED ORDER — BUDESONIDE 0.5 MG/2ML IN SUSP
0.5000 mg | Freq: Two times a day (BID) | RESPIRATORY_TRACT | Status: DC
  Administered 2014-06-22 – 2014-06-27 (×9): 0.5 mg via RESPIRATORY_TRACT
  Filled 2014-06-22 (×12): qty 2

## 2014-06-22 MED ORDER — PROMETHAZINE HCL 25 MG PO TABS: 25.0000 mg | ORAL_TABLET | Freq: Four times a day (QID) | ORAL | Status: DC | PRN

## 2014-06-22 NOTE — H&P (Addendum)
[facility] NAME: [name]    MR#:  [id number]  DATE OF BIRTH:  02-Jul-1960  DATE OF ADMISSION:  06/22/2014  PRIMARY CARE PHYSICIAN: Pcp Not In System   REQUESTING/REFERRING PHYSICIAN: Dahlia Client  CHIEF COMPLAINT:   Chief Complaint  Patient presents with  . Shortness of Breath    HISTORY OF PRESENT ILLNESS:  [name]  is a 54 y.o. female who presents with progressive shortness of breath for the last 3 days. Patient has known lung cancer and is being actively treated. She states that starting 2 days ago she began having shortness of breath which has progressed to shortness of breath at rest, this is associated with central sharp constant chest pain. She is also having some nausea without vomiting. She denies any diarrhea. She has noted significantly more bilateral lower extremity edema over these past few days as well. She denies any fever or chills, urinary symptoms, rash, joint pains. See full review of systems below. On evaluation in the ED she was found to have significant opacities on her chest x-ray which were interpreted by radiology as possible pneumonia, pulmonary edema, or lymphangitic spread of her lung cancer. She also meets sepsis criteria with tachypnea and tachycardia with a suspected pneumonia. Hospitalists were called for admission for all of the above. Of note, patient does have a history of a left lower extremity DVT and is on full anticoagulation for this.  PAST MEDICAL HISTORY:   Past Medical History  Diagnosis Date  . Pneumonia   . Lung cancer   . DVT (deep venous thrombosis)   . Urinary incontinence   . HTN (hypertension)   . Chronic diastolic congestive heart failure     PAST SURGICAL HISTORY:   Past Surgical History  Procedure Laterality Date  . Video bronchoscopy Bilateral 01/14/2013    Procedure: VIDEO BRONCHOSCOPY WITHOUT FLUORO;  Surgeon: Wilhelmina Mcardle, MD;  Location[facility] ENDOSCOPY;   Service: Cardiopulmonary;  Laterality: Bilateral;  . Video bronchoscopy N/A 01/27/2013    Procedure: VIDEO BRONCHOSCOPY;  Surgeon: Ivin Poot, MD;  Location: [place];  Service: Thoracic;  Laterality: N/A;  . Video assisted thoracoscopy Right 01/27/2013    Procedure: VIDEO ASSISTED THORACOSCOPY;  Surgeon: Ivin Poot, MD;  Location: [place];  Service: Thoracic;  Laterality: Right;  . Lung biopsy Right 01/27/2013    Procedure: LUNG BIOPSY;  Surgeon: Ivin Poot, MD;  Location: [place];  Service: Thoracic;  Laterality: Right;  . Pericardial tap N/A 01/11/2013    Procedure: PERICARDIAL TAP;  Surgeon: Blane Ohara, MD;  Location: [facility] CATH LAB;  Service: Cardiovascular;  Laterality: N/A;  . Bladder surgery      SOCIAL HISTORY:   History  Substance Use Topics  . Smoking status: Former Research scientist (life sciences)  . Smokeless tobacco: Never Used  . Alcohol Use: Yes     Comment: occasionally has a drink    FAMILY HISTORY:   Family History  Problem Relation Age of Onset  . Coronary artery disease Mother 13  . Lung cancer Mother   . Prostate cancer Father   . Breast cancer Mother   . Heart attack Mother     DRUG ALLERGIES:   Allergies  Allergen Reactions  . No Known Allergies     MEDICATIONS AT HOME:   Prior to Admission medications   Medication Sig Start Date End Date Taking? Authorizing Provider  apixaban (ELIQUIS) 5 MG TABS tablet Take 1 tablet (5  mg total) by mouth 2 (two) times daily. 05/25/14   Epifanio Lesches, MD  conjugated estrogens (PREMARIN) vaginal cream 1gm pv qhs x 2 weeks, then two times per week x 2 weeks, then weekly for maintenance 05/14/14   Historical Provider, MD  dexamethasone (DECADRON) 4 MG tablet Take 4 mg by mouth every 12 (twelve) hours. 01/28/14   Historical Provider, MD  diphenhydrAMINE (BENADRYL) 25 mg capsule Take 50 mg by mouth daily as needed for allergies.    Historical Provider, MD  dronabinol (MARINOL) 5 MG capsule Take 5 mg by mouth 2 (two) times daily  before a meal.    Historical Provider, MD  fentaNYL (DURAGESIC - DOSED MCG/HR) 100 MCG/HR Place 1 patch (100 mcg total) onto the skin every 3 (three) days. 06/10/14   Lloyd Huger, MD  fentaNYL (DURAGESIC - DOSED MCG/HR) 25 MCG/HR patch Place 1 patch (25 mcg total) onto the skin every 3 (three) days. 06/10/14   Lloyd Huger, MD  furosemide (LASIX) 40 MG tablet Take 1 tablet (40 mg total) by mouth 2 (two) times daily. 06/08/14   Lloyd Huger, MD  gabapentin (NEURONTIN) 300 MG capsule Take 1 capsule (300 mg total) by mouth 3 (three) times daily. 05/20/14   Lloyd Huger, MD  haloperidol (HALDOL) 5 MG tablet Take 1 tablet (5 mg total) by mouth every 8 (eight) hours as needed for agitation. As needed for vomiting not improved by nausea medications 06/03/14   Earleen Newport, MD  HYDROmorphone (DILAUDID) 4 MG tablet Take 1 tablet (4 mg total) by mouth every 4 (four) hours as needed for severe pain. 06/10/14   Lloyd Huger, MD  Ipratropium-Albuterol (COMBIVENT) 20-100 MCG/ACT AERS respimat Inhale 1 puff into the lungs 4 (four) times daily as needed. For shortness of breath and wheezing 05/20/14   Lloyd Huger, MD  metoCLOPramide (REGLAN) 5 MG tablet Take 1 tablet by mouth 4 (four) times daily. 04/17/14   Historical Provider, MD  metoprolol tartrate (LOPRESSOR) 25 MG tablet Take 1 tablet (25 mg total) by mouth 3 (three) times daily. 05/20/14   Lloyd Huger, MD  omeprazole (PRILOSEC) 20 MG capsule Take 20 mg by mouth daily.    Historical Provider, MD  ondansetron (ZOFRAN) 8 MG tablet Take 8 mg by mouth every 8 (eight) hours as needed for nausea or vomiting.    Historical Provider, MD  oxyCODONE-acetaminophen (PERCOCET/ROXICET) 5-325 MG per tablet Take 1-2 tablets by mouth every 4 (four) hours as needed for severe pain. 02/02/13   Erick Colace, NP  potassium chloride SA (K-DUR,KLOR-CON) 20 MEQ tablet Take 1 tablet (20 mEq total) by mouth 2 (two) times daily. 05/20/14   Lloyd Huger, MD  promethazine (PHENERGAN) 25 MG tablet Take 1 tablet (25 mg total) by mouth every 6 (six) hours as needed. For nausea and vomiting. 06/10/14   Lloyd Huger, MD  scopolamine (TRANSDERM-SCOP) 1 MG/3DAYS Place 1 patch (1.5 mg total) onto the skin every 3 (three) days. 06/08/14   Lloyd Huger, MD  senna-docusate (SENOKOT-S) 8.6-50 MG per tablet Take 1 tablet by mouth at bedtime as needed for moderate constipation. 02/02/13   Erick Colace, NP    REVIEW OF SYSTEMS:  Review of Systems  Constitutional: Negative for fever, chills, weight loss and malaise/fatigue.  HENT: Negative for ear pain, hearing loss and tinnitus.   Eyes: Negative for blurred vision, double vision, pain and redness.  Respiratory: Positive for shortness of breath. Negative for  cough and hemoptysis.   Cardiovascular: Positive for chest pain and leg swelling. Negative for palpitations and orthopnea.  Gastrointestinal: Positive for nausea. Negative for vomiting, abdominal pain, diarrhea and constipation.  Genitourinary: Negative for dysuria, frequency and hematuria.  Musculoskeletal: Negative for back pain, joint pain and neck pain.  Skin:       No acne, rash, or lesions  Neurological: Negative for dizziness, tremors, focal weakness and weakness.  Endo/Heme/Allergies: Negative for polydipsia. Does not bruise/bleed easily.  Psychiatric/Behavioral: Negative for depression. The patient is not nervous/anxious and does not have insomnia.      VITAL SIGNS:   Filed Vitals:   06/22/14 0117 06/22/14 0134 06/22/14 0303 06/22/14 0344  BP:  95/64 103/70 95/64  Pulse:  108 103 104  Temp:      TempSrc:      Resp:  '18 21 18  '$ Height:      Weight:      SpO2: 95% 100% 94% 96%   Wt Readings from Last 3 Encounters:  06/22/14 68.04 kg (150 lb)  06/10/14 71.399 kg (157 lb 6.5 oz)  06/09/14 68.04 kg (150 lb)    PHYSICAL EXAMINATION:  Physical Exam  Constitutional: She is oriented to person, place, and time. She  appears well-developed and well-nourished. No distress.  HENT:  Head: Normocephalic and atraumatic.  Mouth/Throat: Oropharynx is clear and moist.  Eyes: Conjunctivae and EOM are normal. Pupils are equal, round, and reactive to light. No scleral icterus.  Neck: Normal range of motion. Neck supple. No JVD present. No thyromegaly present.  Cardiovascular: Normal rate, regular rhythm and intact distal pulses.  Exam reveals no gallop and no friction rub.   No murmur heard. Respiratory: She is in respiratory distress. She has no wheezes. She has rales (bibasilar).  Left lower lobe rhonchi  GI: Soft. Bowel sounds are normal. She exhibits no distension. There is no tenderness.  Musculoskeletal: Normal range of motion. She exhibits edema (left greater than right bilateral lower extremity edema).  No arthritis, no gout  Lymphadenopathy:    She has no cervical adenopathy.  Neurological: She is alert and oriented to person, place, and time. No cranial nerve deficit.  No dysarthria, no aphasia  Skin: Skin is warm and dry. No rash noted. No erythema.  Psychiatric: She has a normal mood and affect. Her behavior is normal. Judgment and thought content normal.    LABORATORY PANEL:   CBC  Recent Labs Lab 06/22/14 0123  WBC 4.7  HGB 10.3*  HCT 32.0*  PLT 189   ------------------------------------------------------------------------------------------------------------------  Chemistries   Recent Labs Lab 06/22/14 0123  NA 136  K 2.5*  CL 95*  CO2 30  GLUCOSE 108*  BUN 13  CREATININE 0.70  CALCIUM 7.6*  AST 20  ALT 20  ALKPHOS 65  BILITOT 1.1   ------------------------------------------------------------------------------------------------------------------  Cardiac Enzymes  Recent Labs Lab 06/22/14 0123  TROPONINI <0.03   ------------------------------------------------------------------------------------------------------------------  RADIOLOGY:  Dg Chest Portable 1  View  06/22/2014   CLINICAL DATA:  Acute onset of shortness of breath and bilateral foot swelling. Generalized chest pain. Initial encounter.  EXAM: PORTABLE CHEST - 1 VIEW  COMPARISON:  Chest radiograph performed 05/16/2014, and CTA of the chest performed 06/09/2014  FINDINGS: The lungs are mildly hypoexpanded. Diffuse bilateral airspace opacification is mildly improved on the right and mildly worsened on the left. Given prior CT images, this is concerning for lymphangitic spread of tumor, though superimposed pneumonia or pulmonary edema cannot be excluded. No definite pleural effusion or  pneumothorax is seen.  The cardiomediastinal silhouette is borderline enlarged. A right-sided chest port is noted ending about the distal SVC. No acute osseous abnormalities are seen.  IMPRESSION: Lungs mildly hypoexpanded. Diffuse bilateral airspace opacification, mildly improved on the right and mildly worsened on the left. Given prior CT images, this remains concerning for lymphangitic spread of tumor, though superimposed pneumonia or pulmonary edema cannot be excluded. Borderline cardiomegaly noted.   Electronically Signed   By: Garald Balding M.D.   On: 06/22/2014 02:21    EKG:   Orders placed or performed during the hospital encounter of 06/22/14  . EKG 12-Lead  . EKG 12-Lead    IMPRESSION AND PLAN:  Principal Problem:   Acute respiratory failure with hypoxemia - patient's O2 sats on arrival in the ED on room air were in the 70s, they improved to the mid 80s with O2 via nasal cannula, but she required BiPAP in order to bring her sats up to more normal level in the 90s. She is currently on BiPAP this time with good O2 saturations and improvement in her respiratory distress. She was treated for pneumonia, given some Lasix for possible pulmonary edema although her BNP was only mildly elevated at 114. Given radiographic imaging interpretation, there is also some question as to whether or not this also due to spread  of her cancer. She does not have improvement in her respiratory status with initial treatments as listed above and below, would consider ruling out PE given her active left lower extremity DVT (this is less likely given that she is artery on full dose anticoagulation and the fact that she had a negative CTA earlier this month), see below. Active Problems:   Adenocarcinoma of lung, stage 4 - currently being treated, will get oncology consult for further opinion for inpatient treatment.   Pneumonia - started on broad antibiotics for HCAP as well as sepsis. We'll get a sputum culture, continue BiPAP for now.   DVT (deep venous thrombosis) - continue on full dose anticoagulation with Eliquis. Consideration for possible PE remains in the differential given her active DVT and her hypoxemia.   Chronic diastolic congestive heart failure - IV Lasix now and continue home dose by mouth Lasix.   Chest pain - trend cardiac enzymes, this is likely pleuritic chest pain from her pneumonia/lung process.   Sepsis - prospect of antibiotics for now, panculture, check lactic acid, hemodynamically stable at this time.   Hypokalemia - replete and monitor.   All the records are reviewed and case discussed with ED provider. Management plans discussed with the patient and/or family.  DVT PROPHYLAXIS: Systemic anticoagulation  ADMISSION STATUS: Inpatient  CODE STATUS: Full  TOTAL TIME TAKING CARE OF THIS PATIENT: 50 minutes.    Shakoya Gilmore Huey 06/22/2014, 4:48 AM  [facility]  Office  [phone]  CC: Primary care physician; Pcp Not In System

## 2014-06-22 NOTE — Progress Notes (Signed)
Patient continues to complain about nausea after dose of zofran and phenergan, Pt also refuses to wear high flow nasal cannula and would rather wear 100% NRM, and potassium resulted at 2.8.  Dr. Bridgett Larsson called and notified of all information via phone.  Per Dr. Bridgett Larsson orders to be placed.

## 2014-06-22 NOTE — Progress Notes (Signed)
Patient has been weaned off bipap. Patient with o2 sats of 100% upon arrival to icu on nrb. Patient is in no respiratory distress. Able to verbalize and sip water without difficulty. Weaned patient to 3liter nasal cannula. Remains tachycardic at this time. HR 120 RR 19. SpO2 on cannula noted at 91-95%. Reported findings to RN

## 2014-06-22 NOTE — Progress Notes (Signed)
John with respiratory called to assess patient for possibility of increased 02 delivery such as veni mask or high flow.

## 2014-06-22 NOTE — ED Notes (Signed)
Pt O2 sats continue to be <85; pt placed on 4L via [place].

## 2014-06-22 NOTE — Progress Notes (Signed)
Dr. Bridgett Larsson called and notified of patient change in status and increased oxygen needs.  Patient to be placed on high flow nasal cannula.

## 2014-06-22 NOTE — Progress Notes (Signed)
ANTIBIOTIC CONSULT NOTE - INITIAL  Pharmacy Consult for Vancomycin and Zosyn  Indication: pneumonia  Allergies  Allergen Reactions  . No Known Allergies     Patient Measurements: Height: '5\' 6"'$  (167.6 cm) Weight: 150 lb (68.04 kg) IBW/kg (Calculated) : 59.3 Adjusted Body Weight: 62.78kg  Vital Signs: Temp: 98.7 F (37.1 C) (06/21 0548) Temp Source: Oral (06/21 0548) BP: 104/68 mmHg (06/21 0600) Pulse Rate: 127 (06/21 0600) Intake/Output from previous day: 06/20 0701 - 06/21 0700 In: 240 [P.O.:240] Out: 400 [Urine:400] Intake/Output from this shift: Total I/O In: 240 [P.O.:240] Out: 400 [Urine:400]  Labs:  Recent Labs  06/22/14 0123  WBC 4.7  HGB 10.3*  PLT 189  CREATININE 0.70   Estimated Creatinine Clearance: 77.9 mL/min (by C-G formula based on Cr of 0.7). No results for input(s): VANCOTROUGH, VANCOPEAK, VANCORANDOM, GENTTROUGH, GENTPEAK, GENTRANDOM, TOBRATROUGH, TOBRAPEAK, TOBRARND, AMIKACINPEAK, AMIKACINTROU, AMIKACIN in the last 72 hours.   Microbiology: No results found for this or any previous visit (from the past 720 hour(s)).  Medical History: Past Medical History  Diagnosis Date  . Pneumonia   . Lung cancer   . DVT (deep venous thrombosis)   . Urinary incontinence   . HTN (hypertension)   . Chronic diastolic congestive heart failure     Medications:  Anti-infectives    Start     Dose/Rate Route Frequency Ordered Stop   06/23/14 0100  vancomycin (VANCOCIN) IVPB 1000 mg/200 mL premix     1,000 mg 200 mL/hr over 60 Minutes Intravenous Every 12 hours 06/22/14 0617     06/22/14 1300  vancomycin (VANCOCIN) IVPB 1000 mg/200 mL premix     1,000 mg 200 mL/hr over 60 Minutes Intravenous  Once 06/22/14 0617     06/22/14 0700  vancomycin (VANCOCIN) IVPB 1000 mg/200 mL premix     1,000 mg 200 mL/hr over 60 Minutes Intravenous  Once 06/22/14 0617     06/22/14 0700  piperacillin-tazobactam (ZOSYN) IVPB 3.375 g     3.375 g 12.5 mL/hr over 240 Minutes  Intravenous Every 8 hours 06/22/14 0618       Assessment: 54 y.o. female who presents with progressive shortness of breath for the last 3 days. Patient has known lung cancer.    Goal of Therapy:  Vancomycin trough level 15-20 mcg/ml. Trough ordered for 6/23 at 00:30.  Plan:  Ordered Vancomycin 1g IV at 07:00, followed by 1g at 13:00 for stacked dosing.  Continue with Vancomycin 1g IV Q12H to begin 6/22 at 01:00.    Ordered Zosyn 3.375g EI Q8H.    Pharmacy will follow and adjust per consult.     Micajah Dennin K, RPh 06/22/2014,6:22 AM

## 2014-06-22 NOTE — ED Notes (Signed)
Dahlia Client, MD at bedside.

## 2014-06-22 NOTE — Progress Notes (Signed)
Pt admitted from ED on 15L .  K infusing, pt A&Ox4.  Pt transitioned to 3L  with sats mid 90s.  Pt belongings placed in closet.

## 2014-06-22 NOTE — ED Provider Notes (Signed)
[facility] Emergency Department Provider Note  ____________________________________________  Time seen: Approximately 0055 AM  I have reviewed the triage vital signs and the nursing notes.   HISTORY  Chief Complaint Shortness of Breath    HPI [name] is a 54 y.o. female with a history of lung cancer who has been doing chemotherapy and is on 2 L of oxygen at home. The patient reports that she started feeling short of breath 2 days ago and has had some swelling in her legs for multiple weeks. The patient has been seeing her cancer doctor who did not recommend any treatment for the swelling in her legs. The patient has been using her inhalers as well as her oxygen at home but it has not been helping. The patient reports that she has some chest pain as well that started yesterday at approximately 4 PM. She reports sharp pain in the middle of her chest and hurts with deep breathing. The patient took some Tylenol but did not help. The patient does have a history of a clot in her left leg and she has been taking Eliquis for that. The patient reports that her last chemotherapy was 2 weeks ago. Patient reports that her pain is a 8 out of 10 in intensity. She's had some headache some nausea and vomiting. She's also had some leg swelling.   Past Medical History  Diagnosis Date  . Pneumonia   . Lung cancer   . DVT (deep venous thrombosis)   . Urinary incontinence   . HTN (hypertension)     Patient Active Problem List   Diagnosis Date Noted  . Pneumonia 05/16/2014  . Shortness of breath 05/16/2014  . Pleuritic chest pain 01/21/2013  . Pulmonary infiltrates 01/12/2013  . Adenocarcinoma of lung, stage 4 01/12/2013  . Pericardial tamponade 01/11/2013    Past Surgical History  Procedure Laterality Date  . Video bronchoscopy Bilateral 01/14/2013    Procedure: VIDEO BRONCHOSCOPY WITHOUT FLUORO;  Surgeon: Wilhelmina Mcardle, MD;  Location: [facility] ENDOSCOPY;  Service:  Cardiopulmonary;  Laterality: Bilateral;  . Video bronchoscopy N/A 01/27/2013    Procedure: VIDEO BRONCHOSCOPY;  Surgeon: Ivin Poot, MD;  Location: [place];  Service: Thoracic;  Laterality: N/A;  . Video assisted thoracoscopy Right 01/27/2013    Procedure: VIDEO ASSISTED THORACOSCOPY;  Surgeon: Ivin Poot, MD;  Location: [place];  Service: Thoracic;  Laterality: Right;  . Lung biopsy Right 01/27/2013    Procedure: LUNG BIOPSY;  Surgeon: Ivin Poot, MD;  Location: [place];  Service: Thoracic;  Laterality: Right;  . Pericardial tap N/A 01/11/2013    Procedure: PERICARDIAL TAP;  Surgeon: Blane Ohara, MD;  Location: [facility] CATH LAB;  Service: Cardiovascular;  Laterality: N/A;  . Bladder surgery      Current Outpatient Rx  Name  Route  Sig  Dispense  Refill  . apixaban (ELIQUIS) 5 MG TABS tablet   Oral   Take 1 tablet (5 mg total) by mouth 2 (two) times daily.   60 tablet   0   . conjugated estrogens (PREMARIN) vaginal cream      1gm pv qhs x 2 weeks, then two times per week x 2 weeks, then weekly for maintenance         . dexamethasone (DECADRON) 4 MG tablet   Oral   Take 4 mg by mouth every 12 (twelve) hours.         . diphenhydrAMINE (BENADRYL) 25 mg capsule   Oral  Take 50 mg by mouth daily as needed for allergies.         Marland Kitchen dronabinol (MARINOL) 5 MG capsule   Oral   Take 5 mg by mouth 2 (two) times daily before a meal.         . fentaNYL (DURAGESIC - DOSED MCG/HR) 100 MCG/HR   Transdermal   Place 1 patch (100 mcg total) onto the skin every 3 (three) days.   10 patch   0   . fentaNYL (DURAGESIC - DOSED MCG/HR) 25 MCG/HR patch   Transdermal   Place 1 patch (25 mcg total) onto the skin every 3 (three) days.   10 patch   0   . furosemide (LASIX) 40 MG tablet   Oral   Take 1 tablet (40 mg total) by mouth 2 (two) times daily.   180 tablet   0   . gabapentin (NEURONTIN) 300 MG capsule   Oral   Take 1 capsule (300 mg total) by mouth 3 (three) times  daily.   90 capsule   1   . haloperidol (HALDOL) 5 MG tablet   Oral   Take 1 tablet (5 mg total) by mouth every 8 (eight) hours as needed for agitation. As needed for vomiting not improved by nausea medications   20 tablet   0   . HYDROmorphone (DILAUDID) 4 MG tablet   Oral   Take 1 tablet (4 mg total) by mouth every 4 (four) hours as needed for severe pain.   90 tablet   0   . Ipratropium-Albuterol (COMBIVENT) 20-100 MCG/ACT AERS respimat   Inhalation   Inhale 1 puff into the lungs 4 (four) times daily as needed. For shortness of breath and wheezing   1 Inhaler   1   . metoCLOPramide (REGLAN) 5 MG tablet   Oral   Take 1 tablet by mouth 4 (four) times daily.      0   . metoprolol tartrate (LOPRESSOR) 25 MG tablet   Oral   Take 1 tablet (25 mg total) by mouth 3 (three) times daily.   90 tablet   1   . omeprazole (PRILOSEC) 20 MG capsule   Oral   Take 20 mg by mouth daily.         . ondansetron (ZOFRAN) 8 MG tablet   Oral   Take 8 mg by mouth every 8 (eight) hours as needed for nausea or vomiting.         Marland Kitchen oxyCODONE-acetaminophen (PERCOCET/ROXICET) 5-325 MG per tablet   Oral   Take 1-2 tablets by mouth every 4 (four) hours as needed for severe pain.   30 tablet   0   . potassium chloride SA (K-DUR,KLOR-CON) 20 MEQ tablet   Oral   Take 1 tablet (20 mEq total) by mouth 2 (two) times daily.   60 tablet   1   . promethazine (PHENERGAN) 25 MG tablet   Oral   Take 1 tablet (25 mg total) by mouth every 6 (six) hours as needed. For nausea and vomiting.   45 tablet   2   . scopolamine (TRANSDERM-SCOP) 1 MG/3DAYS   Transdermal   Place 1 patch (1.5 mg total) onto the skin every 3 (three) days.   30 patch   0   . senna-docusate (SENOKOT-S) 8.6-50 MG per tablet   Oral   Take 1 tablet by mouth at bedtime as needed for moderate constipation.           Allergies  No known allergies  Family History  Problem Relation Age of Onset  . Coronary artery  disease Mother 72  . Lung cancer Mother   . Prostate cancer Father   . Breast cancer Mother   . Heart attack Mother     Social History History  Substance Use Topics  . Smoking status: Former Research scientist (life sciences)  . Smokeless tobacco: Never Used  . Alcohol Use: Yes     Comment: occasionally has a drink    Review of Systems Constitutional: No fever/chills Eyes: No visual changes. ENT: No sore throat. Cardiovascular: Chest pain Respiratory: Shortness of breath Gastrointestinal: Abdominal pain, vomiting, nausea Genitourinary: Negative for dysuria. Musculoskeletal: Negative for back pain. Skin: Negative for rash. Neurological: Headache Hematological/Lymphatic:Bilateral lower extremity swelling  10-point ROS otherwise negative.  ____________________________________________   PHYSICAL EXAM:  VITAL SIGNS: ED Triage Vitals  Enc Vitals Group     BP 06/22/14 0043 100/63 mmHg     Pulse Rate 06/22/14 0043 115     Resp 06/22/14 0043 24     Temp 06/22/14 0043 98.7 F (37.1 C)     Temp Source 06/22/14 0043 Oral     SpO2 06/22/14 0043 78 %     Weight 06/22/14 0043 150 lb (68.04 kg)     Height 06/22/14 0043 '5\' 6"'$  (1.676 m)     Head Cir --      Peak Flow --      Pain Score 06/22/14 0044 6     Pain Loc --      Pain Edu? --      Excl. in [place]? --     Constitutional: Alert and oriented. Ill appearing and in moderate distress. Eyes: Conjunctivae are normal. PERRL. EOMI. Head: Atraumatic. Nose: No congestion/rhinnorhea. Mouth/Throat: Mucous membranes are moist.  Oropharynx non-erythematous. Cardiovascular: Normal rate, regular rhythm. Grossly normal heart sounds.  Good peripheral circulation. Respiratory: Increased work of breathing, crackles at bilateral bases Gastrointestinal: Soft and nontender. No distention. Positive bowel sounds Genitourinary: Deferred Musculoskeletal: Significant lower extremity edema Neurologic:  Normal speech and language. No gross focal neurologic deficits are  appreciated.  Skin:  Erythema to left lower extremity Psychiatric: Mood and affect are normal.   ____________________________________________   LABS (all labs ordered are listed, but only abnormal results are displayed)  Labs Reviewed  CBC - Abnormal; Notable for the following:    RBC 3.77 (*)    Hemoglobin 10.3 (*)    HCT 32.0 (*)    RDW 22.2 (*)    All other components within normal limits  COMPREHENSIVE METABOLIC PANEL - Abnormal; Notable for the following:    Potassium 2.5 (*)    Chloride 95 (*)    Glucose, Bld 108 (*)    Calcium 7.6 (*)    Total Protein 5.3 (*)    Albumin 2.5 (*)    All other components within normal limits  BRAIN NATRIURETIC PEPTIDE - Abnormal; Notable for the following:    B Natriuretic Peptide 114.0 (*)    All other components within normal limits  TROPONIN I   ____________________________________________  EKG  ED ECG REPORT I, Loney Hering, the attending physician, personally viewed and interpreted this ECG.   Date: 06/22/2014  EKG Time: 054  Rate: 112  Rhythm: sinus tachycardia  Axis: Normal  Intervals:Incomplete right bundle branch block  ST&T Change: None  ____________________________________________  RADIOLOGY  Chest x-ray: Lungs mildly hypoexpanded, diffuse bilateral airspace opacification mildly improved on the right and mildly worsened on the left given prior  CT images this remained concerning for lymphangitic spread of the tumor although superimposed pneumonia or pulmonary edema cannot be excluded. ____________________________________________   PROCEDURES  Procedure(s) performed: None  Critical Care performed: Yes, see critical care note(s)   CRITICAL CARE Performed by: Charlesetta Ivory P   Total critical care time: 45 min  Critical care time was exclusive of separately billable procedures and treating other patients.  Critical care was necessary to treat or prevent imminent or life-threatening  deterioration.  Critical care was time spent personally by me on the following activities: development of treatment plan with patient and/or surrogate as well as nursing, discussions with consultants, evaluation of patient's response to treatment, examination of patient, obtaining history from patient or surrogate, ordering and performing treatments and interventions, ordering and review of laboratory studies, ordering and review of radiographic studies, pulse oximetry and re-evaluation of patient's condition.  ____________________________________________   INITIAL IMPRESSION / ASSESSMENT AND PLAN / ED COURSE  Pertinent labs & imaging results that were available during my care of the patient were reviewed by me and considered in my medical decision making (see chart for details).  The patient is a 54 year old female who comes in with shortness of breath. On initial arrival the patient O2 saturations were 78%. The patient was put on BiPAP as she did have crackles in her bases with a concern that she may have fluid in her lungs. We will check the patient's chest x-ray as well as blood work and consider doing another CT scan to determine if the patient has a clot.  ----------------------------------------- 4:19 AM on 06/22/2014 -----------------------------------------  The patient does have some low potassiums I will give her 40 MEQ of potassium. The patient will also receive some Lasix 40 mg IV 1. The patient has been doing well on the BiPAP with improve sats and decreased work of breathing. Given the x-ray and the significant swelling I will admit the patient to the hospitalist service for further treatment and evaluation. ____________________________________________   FINAL CLINICAL IMPRESSION(S) / ED DIAGNOSES  Final diagnoses:  Shortness of breath  Hypoxia  Acute pulmonary edema  Hypokalemia      Loney Hering, MD 06/22/14 [phone]

## 2014-06-22 NOTE — Progress Notes (Signed)
Patient with dyspnea with minimal movement in bed and desats to 84-85%.  Patient insistent on getting out of bed to use bedside commode.  With getting out of bed patient desats to 76-79%. Patient recovery to baseline took 3-4 minutes.  Dr. Bridgett Larsson called and notified of issue.  Patient changed back to stepdown status and if needed place foley due to high doses of lasix.

## 2014-06-22 NOTE — ED Notes (Addendum)
Patient presents with c/o SOB x 2 days. Of note, patient is a lung cancer patient that is current on chemo. (+) generalized CP also reported. Patient presents with increased RR; face mask in place secondary to cancer diagnosis; SPO2 78% on RA. Patient on chronic supplemental oxygen at home; 2L/[employer].

## 2014-06-22 NOTE — ED Notes (Signed)
Admitting MD at bedside.

## 2014-06-22 NOTE — Progress Notes (Signed)
[place] [facility] [place] NAME: [name]    MR#:  [id number]  DATE OF BIRTH:  Nov 22, 1960  SUBJECTIVE:  CHIEF COMPLAINT:   Chief Complaint  Patient presents with  . Shortness of Breath   Still shortness of breath. O2 saturation decreased to 80 to 70s on oxygen by nasal cannular 3-4 L.  REVIEW OF SYSTEMS:  CONSTITUTIONAL: No fever,has generalized  weakness.  EYES: No blurred or double vision.  EARS, NOSE, AND THROAT: No tinnitus or ear pain.  RESPIRATORY: Positive  cough and shortness of breath, no  wheezing or hemoptysis.  CARDIOVASCULAR: No chest pain, orthopnea, edema.  GASTROINTESTINAL: No nausea, vomiting, diarrhea or abdominal pain.  GENITOURINARY: No dysuria, hematuria.  ENDOCRINE: No polyuria, nocturia,  HEMATOLOGY: No anemia, easy bruising or bleeding SKIN: No rash or lesion. MUSCULOSKELETAL: No joint pain or arthritis.   NEUROLOGIC: No tingling, numbness, weakness.  PSYCHIATRY: No anxiety or depression.   DRUG ALLERGIES:   Allergies  Allergen Reactions  . No Known Allergies     VITALS:  Blood pressure 98/73, pulse 102, temperature 98.1 F (36.7 C), temperature source Oral, resp. rate 19, height '5\' 6"'$  (1.676 m), weight 68.04 kg (150 lb), SpO2 99 %.  PHYSICAL EXAMINATION:  GENERAL:  54 y.o.-year-old patient lying in the bed with no acute distress.  EYES: Pupils equal, round, reactive to light and accommodation. No scleral icterus. Extraocular muscles intact.  HEENT: Head atraumatic, normocephalic. Oropharynx and nasopharynx clear.  NECK:  Supple, no jugular venous distention. No thyroid enlargement, no tenderness.  LUNGS: Normal breath sounds bilaterally, no wheezing,bilateral crackles. No use of accessory muscles of respiration.  CARDIOVASCULAR: S1, S2 normal. No murmurs, rubs, or gallops.  ABDOMEN: Soft, nontender, nondistended. Bowel sounds present. No organomegaly or mass.  EXTREMITIES: No pedal edema,  cyanosis, or clubbing.  NEUROLOGIC: Cranial nerves II through XII are intact. Muscle strength 5/5 in all extremities. Sensation intact. Gait not checked.  PSYCHIATRIC: The patient is alert and oriented x 3.  SKIN: No obvious rash, lesion, or ulcer.    LABORATORY PANEL:   CBC  Recent Labs Lab 06/22/14 0908  WBC 4.2  HGB 10.7*  HCT 32.9*  PLT 163   ------------------------------------------------------------------------------------------------------------------  Chemistries   Recent Labs Lab 06/22/14 0123 06/22/14 0908  NA 136 134*  K 2.5* 2.8*  CL 95* 94*  CO2 30 33*  GLUCOSE 108* 117*  BUN 13 9  CREATININE 0.70 0.66  CALCIUM 7.6* 7.3*  MG  --  1.5*  AST 20  --   ALT 20  --   ALKPHOS 65  --   BILITOT 1.1  --    ------------------------------------------------------------------------------------------------------------------  Cardiac Enzymes  Recent Labs Lab 06/22/14 0908  TROPONINI <0.03   ------------------------------------------------------------------------------------------------------------------  RADIOLOGY:  Dg Chest Portable 1 View  06/22/2014   CLINICAL DATA:  Acute onset of shortness of breath and bilateral foot swelling. Generalized chest pain. Initial encounter.  EXAM: PORTABLE CHEST - 1 VIEW  COMPARISON:  Chest radiograph performed 05/16/2014, and CTA of the chest performed 06/09/2014  FINDINGS: The lungs are mildly hypoexpanded. Diffuse bilateral airspace opacification is mildly improved on the right and mildly worsened on the left. Given prior CT images, this is concerning for lymphangitic spread of tumor, though superimposed pneumonia or pulmonary edema cannot be excluded. No definite pleural effusion or pneumothorax is seen.  The cardiomediastinal silhouette is borderline enlarged. A right-sided chest port is noted ending about the distal SVC. No acute osseous abnormalities  are seen.  IMPRESSION: Lungs mildly hypoexpanded. Diffuse bilateral  airspace opacification, mildly improved on the right and mildly worsened on the left. Given prior CT images, this remains concerning for lymphangitic spread of tumor, though superimposed pneumonia or pulmonary edema cannot be excluded. Borderline cardiomegaly noted.   Electronically Signed   By: Garald Balding M.D.   On: 06/22/2014 02:21    EKG:   Orders placed or performed during the hospital encounter of 06/22/14  . EKG 12-Lead  . EKG 12-Lead    ASSESSMENT AND PLAN:   Acute respiratory failure with hypoxemia. Off BIPAP, patient refused HF O2, put on O2 100%. Continue Solu-Medrol and  nebulizer treatment. Follow-up pulmonary consult.  Sepsis with Pneumonia  Continue Zosyn and vancomycin. Zosyn and vancomycin. Continue  nebulizer treatment.  Adenocarcinoma of lung, stage 4. I requested palliative care consult, however,  per Dr. Ermalinda Memos, she had been following this patient but the patient wants to manage herself.  Follow-up oncology consult for further opinion for inpatient treatment.   Hypokalemia - replete potassium and follow-up BMP  Hypomagnesemia.give supplement and follow-up magnesium level.   DVT (deep venous thrombosis) - continue on full dose anticoagulation with Eliquis.  Chronic diastolic congestive heart failure. continue home dose by mouth Lasix.      All the records are reviewed and case discussed with Care Management/Social Workerr. Management plans discussed with the patient, family and they are in agreement.  CODE STATUS: Full Code  TOTAL TIME TAKING CARE OF THIS PATIENT: 47 minutes.   POSSIBLE D/C IN 5 DAYS, DEPENDING ON CLINICAL CONDITION.   Demetrios Loll M.D on 06/22/2014 at 2:12 PM  Between 7am to 6pm - Pager - [phone]  After 6pm go to [email or web address] - password [email or web address] [facility]  Office  [phone]  CC: Primary care physician; Pcp Not In System

## 2014-06-23 LAB — BASIC METABOLIC PANEL
Anion gap: 9 (ref 5–15)
BUN: 10 mg/dL (ref 6–20)
CO2: 33 mmol/L — ABNORMAL HIGH (ref 22–32)
Calcium: 7.4 mg/dL — ABNORMAL LOW (ref 8.9–10.3)
Chloride: 95 mmol/L — ABNORMAL LOW (ref 101–111)
Creatinine, Ser: 0.63 mg/dL (ref 0.44–1.00)
GFR calc Af Amer: >60 mL/min (ref >60)
GFR calc non Af Amer: >60 mL/min (ref >60)
GLUCOSE: 162 mg/dL — AB (ref 65–99)
POTASSIUM: 3.7 mmol/L (ref 3.5–5.1)
SODIUM: 137 mmol/L (ref 135–145)

## 2014-06-23 LAB — CBC WITH DIFFERENTIAL/PLATELET
BASOS PCT: 0 %
Basophils Absolute: 0 10*3/uL (ref 0–0.1)
Eosinophils Absolute: 0 10*3/uL (ref 0–0.7)
Eosinophils Relative: 0 %
HCT: 31.6 % — ABNORMAL LOW (ref 35.0–47.0)
Hemoglobin: 10.2 g/dL — ABNORMAL LOW (ref 12.0–16.0)
Lymphocytes Relative: 5 %
Lymphs Abs: 0.2 10*3/uL — ABNORMAL LOW (ref 1.0–3.6)
MCH: 27.6 pg (ref 26.0–34.0)
MCHC: 32.2 g/dL (ref 32.0–36.0)
MCV: 85.6 fL (ref 80.0–100.0)
MONOS PCT: 2 %
Monocytes Absolute: 0.1 10*3/uL — ABNORMAL LOW (ref 0.2–0.9)
NEUTROS ABS: 3.4 10*3/uL (ref 1.4–6.5)
NEUTROS PCT: 93 %
Platelets: 174 10*3/uL (ref 150–440)
RBC: 3.69 MIL/uL — ABNORMAL LOW (ref 3.80–5.20)
RDW: 21.7 % — ABNORMAL HIGH (ref 11.5–14.5)
WBC: 3.7 10*3/uL (ref 3.6–11.0)

## 2014-06-23 LAB — EXPECTORATED SPUTUM ASSESSMENT W GRAM STAIN, RFLX TO RESP C

## 2014-06-23 LAB — EXPECTORATED SPUTUM ASSESSMENT W REFEX TO RESP CULTURE

## 2014-06-23 LAB — MAGNESIUM: Magnesium: 2.1 mg/dL (ref 1.7–2.4)

## 2014-06-23 LAB — TROPONIN I: Troponin I: 0.08 ng/mL — ABNORMAL HIGH (ref <0.031)

## 2014-06-23 MED ORDER — HYDROMORPHONE HCL 1 MG/ML IJ SOLN
2.0000 mg | INTRAMUSCULAR | Status: DC | PRN
  Administered 2014-06-23 – 2014-06-28 (×28): 2 mg via INTRAVENOUS
  Filled 2014-06-23 (×28): qty 2

## 2014-06-23 NOTE — Care Management Note (Signed)
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 Admitted with  hypoxic respiratory failure. Having respiratory distress on HFNC at 75%. Full code,  wants to be intubated if needed. Palliative Care to consult.   Action/Plan:   Expected Discharge Date:                  Expected Discharge Plan:     In-House Referral:     Discharge planning Services     Post Acute Care Choice:    Choice offered to:     DME Arranged:    DME Agency:     HH Arranged:    [place] Agency:     Status of Service:     Medicare Important Message Given:    Date Medicare IM Given:    Medicare IM give by:    Date Additional Medicare IM Given:    Additional Medicare Important Message give by:     If discussed at [place] of Stay Meetings, dates discussed:    Additional Comments:  Jolly Mango, RN 06/23/2014, 1:54 PM

## 2014-06-23 NOTE — Therapy (Signed)
Patient changed to [place] 6 lpm by nurse. Patient educated on splinting cough and deep breathing.

## 2014-06-23 NOTE — Progress Notes (Addendum)
[name] is alert and oriented. VSS. Pt is currently on HFNC. Mood pleasant and copperative. SOB during exertion. Paged Dr. Bridgett Larsson regarding pt daughter [name])  That would like to talk to him about her condition. Physician is aware and stated that he would call her. Dr. Mortimer Fries was also informed that pt sister Vaughan Basta) would like to talk to him as well

## 2014-06-23 NOTE — Consult Note (Signed)
Palliative Medicine Inpatient Consult Note   Name: [name] Date: 06/23/2014 MRN: [id number]  DOB: December 14, 1960  Referring Physician: Lance Coon, MD  Palliative Care consult requested for this 54 y.o. 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 Pt was admitted 06/22/14 with shortness of breath and hypoxia. CXR c/w progression of lymphangitic spread vs pneumonia vs pulmonary edema. At present, pt is in CCU on HFNC. Awake, oriented, denies discomfort at present.    REVIEW OF SYSTEMS:  Pain: Mild Dyspnea:  Yes Nausea/Vomiting:  Yes Diarrhea:  No Constipation:   No Depression:   No Anxiety:   No Fatigue:   Yes  SPIRITUAL SUPPORT SYSTEM: No.  SOCIAL HISTORY:Pt lives at home with her 3 dogs. She is divorced. She has two daughters and a son. Her mother and 2 of her brothers live across the street from her and are very involved in her care. 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   reports that she has quit smoking. She has never used smokeless tobacco. She reports that she drinks alcohol. She reports that she does not use illicit drugs.  LEGAL DOCUMENTS:  Health Care Power of Attorney:  Yes. Daughter, [name]  CODE STATUS: Full code  PAST MEDICAL HISTORY: Past Medical History  Diagnosis Date  . Pneumonia   . Lung cancer   . DVT (deep venous thrombosis)   . Urinary incontinence   . HTN (hypertension)   . Chronic diastolic congestive heart failure     PAST SURGICAL HISTORY:  Past Surgical History  Procedure Laterality Date  . Video bronchoscopy  Bilateral 01/14/2013    Procedure: VIDEO BRONCHOSCOPY WITHOUT FLUORO;  Surgeon: Wilhelmina Mcardle, MD;  Location: [facility] ENDOSCOPY;  Service: Cardiopulmonary;  Laterality: Bilateral;  . Video bronchoscopy N/A 01/27/2013    Procedure: VIDEO BRONCHOSCOPY;  Surgeon: Ivin Poot, MD;  Location: [place];  Service: Thoracic;  Laterality: N/A;  . Video assisted thoracoscopy Right 01/27/2013    Procedure: VIDEO ASSISTED THORACOSCOPY;  Surgeon: Ivin Poot, MD;  Location: [place];  Service: Thoracic;  Laterality: Right;  . Lung biopsy Right 01/27/2013    Procedure: LUNG BIOPSY;  Surgeon: Ivin Poot, MD;  Location: [place];  Service: Thoracic;  Laterality: Right;  . Pericardial tap N/A 01/11/2013    Procedure: PERICARDIAL TAP;  Surgeon: Blane Ohara, MD;  Location: [facility] CATH LAB;  Service: Cardiovascular;  Laterality: N/A;  . Bladder surgery      ALLERGIES:  has No Known Allergies.  MEDICATIONS:  Current Facility-Administered Medications  Medication Dose Route Frequency Provider Last Rate Last Dose  . acetaminophen (TYLENOL) tablet 650 mg  650 mg Oral Q6H PRN Demetrios Loll, MD   650 mg at 06/22/14 0818  . apixaban (ELIQUIS) tablet 5 mg  5 mg Oral BID Lance Coon, MD   5 mg at 06/23/14 1024  . budesonide (PULMICORT) nebulizer solution 0.5 mg  0.5 mg Nebulization BID Flora Lipps, MD   0.5 mg at 06/23/14 0817  . fentaNYL (DURAGESIC - dosed mcg/hr) 100 mcg  100 mcg Transdermal Q72H Lance Coon, MD  100 mcg at 06/22/14 0636  . fentaNYL (DURAGESIC - dosed mcg/hr) patch 25 mcg  25 mcg Transdermal Q72H Lance Coon, MD   25 mcg at 06/22/14 0636  . furosemide (LASIX) tablet 40 mg  40 mg Oral BID Lance Coon, MD   40 mg at 06/23/14 0759  . HYDROmorphone (DILAUDID) injection 2 mg  2 mg Intravenous Q4H PRN Flora Lipps, MD      . ipratropium-albuterol (DUONEB) 0.5-2.5 (3) MG/3ML nebulizer solution 3 mL  3 mL Nebulization Q4H Flora Lipps, MD   3 mL at 06/23/14 0817  . methylPREDNISolone sodium succinate  (SOLU-MEDROL) 40 mg/mL injection 40 mg  40 mg Intravenous Q12H Flora Lipps, MD   40 mg at 06/23/14 1024  . metoprolol tartrate (LOPRESSOR) tablet 25 mg  25 mg Oral BID Lance Coon, MD   25 mg at 06/22/14 2100  . ondansetron (ZOFRAN) injection 4 mg  4 mg Intravenous Q6H PRN Theodoro Grist, MD   4 mg at 06/23/14 1024  . ondansetron (ZOFRAN) tablet 8 mg  8 mg Oral Q8H PRN Lance Coon, MD   8 mg at 06/22/14 0949  . oxyCODONE (Oxy IR/ROXICODONE) immediate release tablet 5 mg  5 mg Oral Q4H PRN Demetrios Loll, MD   5 mg at 06/23/14 1024  . pantoprazole (PROTONIX) EC tablet 40 mg  40 mg Oral Daily Lance Coon, MD   40 mg at 06/23/14 1024  . piperacillin-tazobactam (ZOSYN) IVPB 3.375 g  3.375 g Intravenous Q8H Lance Coon, MD   3.375 g at 06/23/14 0759  . promethazine (PHENERGAN) injection 12.5-25 mg  12.5-25 mg Intravenous Q6H PRN Lance Coon, MD   25 mg at 06/23/14 0759  . senna-docusate (Senokot-S) tablet 1 tablet  1 tablet Oral QHS PRN Lance Coon, MD      . sodium chloride 0.9 % injection 3 mL  3 mL Intravenous Q12H Lance Coon, MD   3 mL at 06/23/14 1049  . sulfamethoxazole-trimethoprim (BACTRIM,SEPTRA) 400-80 MG per tablet 2 tablet  2 tablet Oral Q12H Flora Lipps, MD   2 tablet at 06/23/14 1024  . vancomycin (VANCOCIN) IVPB 1000 mg/200 mL premix  1,000 mg Intravenous Q12H Lance Coon, MD   1,000 mg at 06/23/14 0054   Facility-Administered Medications Ordered in Other Encounters  Medication Dose Route Frequency Provider Last Rate Last Dose  . sodium chloride 0.9 % injection 10 mL  10 mL Intracatheter PRN Lloyd Huger, MD   10 mL at 05/06/14 1435    Vital Signs: BP 99/68 mmHg  Pulse 113  Temp(Src) 97.8 F (36.6 C) (Oral)  Resp 12  Ht '5\' 6"'$  (1.676 m)  Wt 68.04 kg (150 lb)  BMI 24.22 kg/m2  SpO2 96% Filed Weights   06/22/14 0043  Weight: 68.04 kg (150 lb)    Estimated body mass index is 24.22 kg/(m^2) as calculated from the following:   Height as of this encounter: '5\' 6"'$  (1.676  m).   Weight as of this encounter: 68.04 kg (150 lb).  PERFORMANCE STATUS (ECOG) : 0 - Asymptomatic by Dr Gary Fleet note 06/16/14  PHYSICAL EXAM:  General: critically ill-appearing HEENT: OP clear, HFNC in place Neck: Trachea midline  Cardiovascular: regular rhythm, tachycardic Pulmonary/Chest: poor air movement ant fields without audible wheeze Abdominal: Soft, nontender, hypoactive bowel sounds GU: No SP tenderness Extremities: 1+ pitting edema LLE, trace RLE Neurological: alert, oriented, grossly nonfocal Skin: no rashes Psychiatric: calm   LABS: CBC:  Recent Labs Lab 06/22/14 0908 06/23/14 0102  WBC 4.2 3.7  HGB 10.7* 10.2*  HCT 32.9* 31.6*  PLT 163 174   Comprehensive Metabolic Panel:  Recent Labs Lab 06/22/14 0123 06/22/14 0908 06/23/14 0102  Garcia 136 134* 137  K 2.5* 2.8* 3.7  CL 95* 94* 95*  CO2 30 33* 33*  GLUCOSE 108* 117* 162*  BUN '13 9 10  '$ CREATININE 0.70 0.66 0.63  CALCIUM 7.6* 7.3* 7.4*  MG  --  1.5* 2.1  AST 20  --   --   ALT 20  --   --   ALKPHOS 65  --   --   BILITOT 1.1  --   --     IMPRESSION: [name] is a 54 yo woman with PMH of 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 Pt was just admitted 06/22/14 with shortness of breath and hypoxia. CXR c/w progression of lymphangitic spread vs pneumonia vs pulmonary edema. This is pt's 18 th admission in the past year.  Pt well-known to palliative medicine team (have seen her during 7 or her prior admissions). We have had multiple discussions with her about goals of therapy. She is very determined to continue tx for her cancer "if God allows it".   We discussed code status. Pt says that she has let her daughter, [name], know her wishes.  Pt says that she does want intubation but only so that she can see her loved ones to say good bye. Pt says that family is aware of her critical state and that they are "all coming here".  I asked if she would want intubation if she had a chance to see all of her family and say goodbye. She thought about this and replied "probably not". She made it very clear that intubation would only be short term and that she would never want to live on a vent, eg trach.   I will continue to follow pt during her hospitalization and help with decisions as possible. Pt gave me permission to discuss her end of life wishes with her daughter when she arrives from [place].  PLAN: As above   More than 50% of the visit was spent in counseling/coordination of care: YES  Time spent: 70 minutes

## 2014-06-23 NOTE — Consult Note (Signed)
[facility]  Inpatient day:  06/22/2014  Chief Complaint: [name] is a 54 y.o. female with metastatic adenocarcinoma of the lung who was admitted on 06/22/2014 to the intensive care unit with acute respiratory failure and hypoxia.  HPI: The patient has stage IV adenocarcinoma along with bone metastasis. He is followed by Dr. Delight Hoh.  She has known lymphangitic spread of tumor from an open lung biopsy.  Currently she receives Nivolumab (Opdivo).  She last received treatment on 06/10/2014.  Per conversations with Dr. Grayland Ormond, she has had some difficulty staying on track with her chemotherapy (typically given every 2 weeks) because of her recurrent hospitalizations. She has also been seen in the emergency room on multiple occasions.  She was last hospitalized from 05/16/2014 to 05/24/2014 with shortness of breath lateral lower extremity edema. She was felt to have pneumonia. She was diagnosed with a DVT of the left common femoral vein and started on Lovenox and then converted to Eliquis prior to discharge.  Chest CT angiogram on 05/16/2014 evidence of pulmonary embolism. She did have extensive nodularity and increased pulmonary interstitial throughout all lobes of the lungs known cancer. There was more confluent consolidation of the right lower lobe with superimposed pneumonia not excluded.  Chest CT angiogram on 06/09/2014 revealed no evidence of pulmonary embolism resolution of bilateral pleural effusions but interstitial lung disease worrisome for lymphangitic tumor progression.  The patient presented on 06/22/2014 with increasing shortness of breath over the past interval days. She is also had some sharp chest pain. She has nausea without vomiting. She has had no fever.  Chest x-rays diffuse bilateral air space opacification mildly improved from the right and mildly worsening on the left. Comparison with CT images remains concerning for lymphangitic spread of  tumor superimposed pneumonia or pulmonary edema cannot be excluded.  The patient was admitted to the intensive care unit.  While in the ER, oxygen saturations were in the 70s. She states that she is typically on oxygen 2 L via nasal cannula at home. She was placed on BiPAP. She has been started on broad spectrum antibiotics (vancomycin and Zosyn) for treatment of pneumonia as well as diuresis for pulmonary edema.  She has been started on steroids.  Past Medical History  Diagnosis Date  . Pneumonia   . Lung cancer   . DVT (deep venous thrombosis)   . Urinary incontinence   . HTN (hypertension)   . Chronic diastolic congestive heart failure     Past Surgical History  Procedure Laterality Date  . Video bronchoscopy Bilateral 01/14/2013    Procedure: VIDEO BRONCHOSCOPY WITHOUT FLUORO;  Surgeon: Wilhelmina Mcardle, MD;  Location: [facility] ENDOSCOPY;  Service: Cardiopulmonary;  Laterality: Bilateral;  . Video bronchoscopy N/A 01/27/2013    Procedure: VIDEO BRONCHOSCOPY;  Surgeon: Ivin Poot, MD;  Location: [place];  Service: Thoracic;  Laterality: N/A;  . Video assisted thoracoscopy Right 01/27/2013    Procedure: VIDEO ASSISTED THORACOSCOPY;  Surgeon: Ivin Poot, MD;  Location: [place];  Service: Thoracic;  Laterality: Right;  . Lung biopsy Right 01/27/2013    Procedure: LUNG BIOPSY;  Surgeon: Ivin Poot, MD;  Location: [place];  Service: Thoracic;  Laterality: Right;  . Pericardial tap N/A 01/11/2013    Procedure: PERICARDIAL TAP;  Surgeon: Blane Ohara, MD;  Location: [facility] CATH LAB;  Service: Cardiovascular;  Laterality: N/A;  . Bladder surgery      Family History  Problem Relation Age of Onset  . Coronary  artery disease Mother 57  . Lung cancer Mother   . Prostate cancer Father   . Breast cancer Mother   . Heart attack Mother     Social History:  reports that she has quit smoking. She has never used smokeless tobacco. She reports that she drinks alcohol. She reports that she does not  use illicit drugs.  The patient is alone today.  She states that her daughter has medical power of attorney.  Allergies: No Known Allergies  Medications Prior to Admission  Medication Sig Dispense Refill  . apixaban (ELIQUIS) 5 MG TABS tablet Take 1 tablet (5 mg total) by mouth 2 (two) times daily. 60 tablet 0  . conjugated estrogens (PREMARIN) vaginal cream Place 1 Applicatorful vaginally 3 (three) times a week. Pt uses on Monday, Wednesday, and Friday.    . dronabinol (MARINOL) 5 MG capsule Take 5 mg by mouth 2 (two) times daily.     . fentaNYL (DURAGESIC - DOSED MCG/HR) 100 MCG/HR Place 1 patch (100 mcg total) onto the skin every 3 (three) days. (Patient taking differently: Place 100 mcg onto the skin every 3 (three) days. Pt uses in addition to the 80mg patch.) 10 patch 0  . fentaNYL (DURAGESIC - DOSED MCG/HR) 25 MCG/HR patch Place 1 patch (25 mcg total) onto the skin every 3 (three) days. (Patient taking differently: Place 25 mcg onto the skin every 3 (three) days. Pt uses in addition to the 1044m patch.) 10 patch 0  . furosemide (LASIX) 40 MG tablet Take 1 tablet (40 mg total) by mouth 2 (two) times daily. 180 tablet 0  . gabapentin (NEURONTIN) 300 MG capsule Take 1 capsule (300 mg total) by mouth 3 (three) times daily. 90 capsule 1  . HYDROmorphone (DILAUDID) 4 MG tablet Take 1 tablet (4 mg total) by mouth every 4 (four) hours as needed for severe pain. 90 tablet 0  . Ipratropium-Albuterol (COMBIVENT) 20-100 MCG/ACT AERS respimat Inhale 1 puff into the lungs 4 (four) times daily as needed. For shortness of breath and wheezing (Patient taking differently: Inhale 1 puff into the lungs 4 (four) times daily as needed for wheezing or shortness of breath. ) 1 Inhaler 1  . metoCLOPramide (REGLAN) 5 MG tablet Take 5 mg by mouth 4 (four) times daily as needed for nausea.    . metoprolol tartrate (LOPRESSOR) 25 MG tablet Take 1 tablet (25 mg total) by mouth 3 (three) times daily. 90 tablet 1  .  omeprazole (PRILOSEC) 20 MG capsule Take 20 mg by mouth daily.    . ondansetron (ZOFRAN) 8 MG tablet Take 8 mg by mouth every 8 (eight) hours as needed for nausea or vomiting.    . potassium chloride SA (K-DUR,KLOR-CON) 20 MEQ tablet Take 1 tablet (20 mEq total) by mouth 2 (two) times daily. 60 tablet 1  . promethazine (PHENERGAN) 25 MG tablet Take 1 tablet (25 mg total) by mouth every 6 (six) hours as needed. For nausea and vomiting. (Patient taking differently: Take 25 mg by mouth every 6 (six) hours as needed for nausea or vomiting. ) 45 tablet 2  . scopolamine (TRANSDERM-SCOP) 1 MG/3DAYS Place 1 patch (1.5 mg total) onto the skin every 3 (three) days. 30 patch 0    Review of Systems: GENERAL:  Fatigue.  Generally weak.  "Feels yuck". Cares for herself at home. No fevers or sweats.  Unspecified weight loss. PERFORMANCE STATUS (ECOG):  2 HEENT:  No visual changes, runny nose, sore throat, mouth sores or tenderness.  Lungs: Chronic shortness of breath, worse.  Hemoptysis last week. Cardiac:  Chest pain.  No palpitations, orthopnea, or PND. GI:  Nausea without vomiting.  No diarrhea, constipation, melena or hematochezia. GU:  No urgency, frequency, dysuria, or hematuria. Musculoskeletal:  No back pain.  No joint pain.  No muscle tenderness. Extremities:  No pain or swelling. Skin:  No rashes or skin changes. Neuro:  General weakness.  No headache, numbness or weakness, balance or coordination issues. Endocrine:  No diabetes, thyroid issues, hot flashes or night sweats. Psych:  No mood changes, depression or anxiety. Pain:  No focal pain. Review of systems:  All other systems reviewed and found to be negative.   Physical Exam:  Blood pressure 97/73, pulse 102, temperature 98.2 F (36.8 C), temperature source Oral, resp. rate 15, height 5' 6"  (1.676 m), weight 150 lb (68.04 kg), SpO2 99 %.  GENERAL:  Chronically ill appearing woman sitting comfortably on the medical intensive care unit in no  acute distress. MENTAL STATUS:  Alert and oriented to person, place and time. HEAD:  Brown, slightly disheveled hair.  Normocephalic, atraumatic, face symmetric.  Cushingoid features. EYES: Pupils equal round and reactive to light and accomodation.  No conjunctivitis or scleral icterus. ENT:  Face mask in place.  Oropharynx clear without lesion.  Tongue normal. Mucous membranes moist.  RESPIRATORY:  Decreased respiratory excursion.  Coarse crackles at the bases (right > left).  No wheezes. CARDIOVASCULAR:  Regular rate and rhythm without murmur, rub or gallop. ABDOMEN:  Soft, non-tender, with active bowel sounds, and no hepatosplenomegaly.  No masses. SKIN:  No rashes, ulcers or lesions. EXTREMITIES: Tender bilateral lower extremity edema.  No palpable cords. LYMPH NODES: No palpable cervical, supraclavicular, axillary or inguinal adenopathy  NEUROLOGICAL: Unremarkable. PSYCH:  Appropriate.  Results for orders placed or performed during the hospital encounter of 06/22/14 (from the past 48 hour(s))  CBC     Status: Abnormal   Collection Time: 06/22/14  1:23 AM  Result Value Ref Range   WBC 4.7 3.6 - 11.0 K/uL   RBC 3.77 (L) 3.80 - 5.20 MIL/uL   Hemoglobin 10.3 (L) 12.0 - 16.0 g/dL   HCT 32.0 (L) 35.0 - 47.0 %   MCV 85.0 80.0 - 100.0 fL   MCH 27.4 26.0 - 34.0 pg   MCHC 32.3 32.0 - 36.0 g/dL   RDW 22.2 (H) 11.5 - 14.5 %   Platelets 189 150 - 440 K/uL  Comprehensive metabolic panel     Status: Abnormal   Collection Time: 06/22/14  1:23 AM  Result Value Ref Range   Sodium 136 135 - 145 mmol/L   Potassium 2.5 (LL) 3.5 - 5.1 mmol/L    Comment: CRITICAL RESULT CALLED TO, READ BACK BY AND VERIFIED WITH BUTCH WOODS @ 0216 6.21.16 MPG    Chloride 95 (L) 101 - 111 mmol/L   CO2 30 22 - 32 mmol/L   Glucose, Bld 108 (H) 65 - 99 mg/dL   BUN 13 6 - 20 mg/dL   Creatinine, Ser 0.70 0.44 - 1.00 mg/dL   Calcium 7.6 (L) 8.9 - 10.3 mg/dL   Total Protein 5.3 (L) 6.5 - 8.1 g/dL   Albumin 2.5 (L) 3.5  - 5.0 g/dL   AST 20 15 - 41 U/L   ALT 20 14 - 54 U/L   Alkaline Phosphatase 65 38 - 126 U/L   Total Bilirubin 1.1 0.3 - 1.2 mg/dL   GFR calc non Af Amer >60 >60 mL/min  GFR calc Af Amer >60 >60 mL/min    Comment: (NOTE) The eGFR has been calculated using the CKD EPI equation. This calculation has not been validated in all clinical situations. eGFR's persistently <60 mL/min signify possible Chronic Kidney Disease.    Anion gap 11 5 - 15  Troponin I     Status: None   Collection Time: 06/22/14  1:23 AM  Result Value Ref Range   Troponin I <0.03 <0.031 ng/mL    Comment:        NO INDICATION OF MYOCARDIAL INJURY.   Brain natriuretic peptide     Status: Abnormal   Collection Time: 06/22/14  1:36 AM  Result Value Ref Range   B Natriuretic Peptide 114.0 (H) 0.0 - 100.0 pg/mL  Lactic acid, plasma     Status: None   Collection Time: 06/22/14  5:10 AM  Result Value Ref Range   Lactic Acid, Venous 0.9 0.5 - 2.0 mmol/L  Urinalysis complete, with microscopic ([facility] only)     Status: Abnormal   Collection Time: 06/22/14  8:34 AM  Result Value Ref Range   Color, Urine STRAW (A) YELLOW   APPearance CLEAR (A) CLEAR   Glucose, UA NEGATIVE NEGATIVE mg/dL   Bilirubin Urine NEGATIVE NEGATIVE   Ketones, ur TRACE (A) NEGATIVE mg/dL   Specific Gravity, Urine 1.005 1.005 - 1.030   Hgb urine dipstick NEGATIVE NEGATIVE   pH 7.0 5.0 - 8.0   Protein, ur NEGATIVE NEGATIVE mg/dL   Nitrite NEGATIVE NEGATIVE   Leukocytes, UA TRACE (A) NEGATIVE   RBC / HPF 0-5 0 - 5 RBC/hpf   WBC, UA 0-5 0 - 5 WBC/hpf   Bacteria, UA NONE SEEN NONE SEEN   Squamous Epithelial / LPF 0-5 (A) NONE SEEN  Troponin I     Status: None   Collection Time: 06/22/14  9:08 AM  Result Value Ref Range   Troponin I <0.03 <0.031 ng/mL    Comment:        NO INDICATION OF MYOCARDIAL INJURY.   Basic metabolic panel     Status: Abnormal   Collection Time: 06/22/14  9:08 AM  Result Value Ref Range   Sodium 134 (L) 135 - 145 mmol/L    Potassium 2.8 (LL) 3.5 - 5.1 mmol/L    Comment: CRITICAL RESULT CALLED TO, READ BACK BY AND VERIFIED WITH RACHEL VERDI AT 9179 06/22/14 DAS    Chloride 94 (L) 101 - 111 mmol/L   CO2 33 (H) 22 - 32 mmol/L   Glucose, Bld 117 (H) 65 - 99 mg/dL   BUN 9 6 - 20 mg/dL   Creatinine, Ser 0.66 0.44 - 1.00 mg/dL   Calcium 7.3 (L) 8.9 - 10.3 mg/dL   GFR calc non Af Amer >60 >60 mL/min   GFR calc Af Amer >60 >60 mL/min    Comment: (NOTE) The eGFR has been calculated using the CKD EPI equation. This calculation has not been validated in all clinical situations. eGFR's persistently <60 mL/min signify possible Chronic Kidney Disease.    Anion gap 7 5 - 15  CBC     Status: Abnormal   Collection Time: 06/22/14  9:08 AM  Result Value Ref Range   WBC 4.2 3.6 - 11.0 K/uL   RBC 3.88 3.80 - 5.20 MIL/uL   Hemoglobin 10.7 (L) 12.0 - 16.0 g/dL   HCT 32.9 (L) 35.0 - 47.0 %   MCV 84.8 80.0 - 100.0 fL   MCH 27.5 26.0 - 34.0 pg  MCHC 32.5 32.0 - 36.0 g/dL   RDW 22.0 (H) 11.5 - 14.5 %   Platelets 163 150 - 440 K/uL  Magnesium     Status: Abnormal   Collection Time: 06/22/14  9:08 AM  Result Value Ref Range   Magnesium 1.5 (L) 1.7 - 2.4 mg/dL  Troponin I     Status: None   Collection Time: 06/22/14  3:27 PM  Result Value Ref Range   Troponin I <0.03 <0.031 ng/mL    Comment:        NO INDICATION OF MYOCARDIAL INJURY.   Troponin I     Status: Abnormal   Collection Time: 06/23/14  1:02 AM  Result Value Ref Range   Troponin I 0.08 (H) <0.031 ng/mL    Comment: READ BACK AND VERIFIED BY TESS THOMAS @0208  06/23/14.Marland KitchenMarland KitchenAJO        PERSISTENTLY INCREASED TROPONIN VALUES IN THE RANGE OF 0.04-0.49 ng/mL CAN BE SEEN IN:       -UNSTABLE ANGINA       -CONGESTIVE HEART FAILURE       -MYOCARDITIS       -CHEST TRAUMA       -ARRYHTHMIAS       -LATE PRESENTING MYOCARDIAL INFARCTION       -COPD   CLINICAL FOLLOW-UP RECOMMENDED.   Magnesium     Status: None   Collection Time: 06/23/14  1:02 AM  Result  Value Ref Range   Magnesium 2.1 1.7 - 2.4 mg/dL  Basic metabolic panel     Status: Abnormal   Collection Time: 06/23/14  1:02 AM  Result Value Ref Range   Sodium 137 135 - 145 mmol/L   Potassium 3.7 3.5 - 5.1 mmol/L   Chloride 95 (L) 101 - 111 mmol/L   CO2 33 (H) 22 - 32 mmol/L   Glucose, Bld 162 (H) 65 - 99 mg/dL   BUN 10 6 - 20 mg/dL   Creatinine, Ser 0.63 0.44 - 1.00 mg/dL   Calcium 7.4 (L) 8.9 - 10.3 mg/dL   GFR calc non Af Amer >60 >60 mL/min   GFR calc Af Amer >60 >60 mL/min    Comment: (NOTE) The eGFR has been calculated using the CKD EPI equation. This calculation has not been validated in all clinical situations. eGFR's persistently <60 mL/min signify possible Chronic Kidney Disease.    Anion gap 9 5 - 15  CBC with Differential/Platelet     Status: Abnormal   Collection Time: 06/23/14  1:02 AM  Result Value Ref Range   WBC 3.7 3.6 - 11.0 K/uL   RBC 3.69 (L) 3.80 - 5.20 MIL/uL   Hemoglobin 10.2 (L) 12.0 - 16.0 g/dL   HCT 31.6 (L) 35.0 - 47.0 %   MCV 85.6 80.0 - 100.0 fL   MCH 27.6 26.0 - 34.0 pg   MCHC 32.2 32.0 - 36.0 g/dL   RDW 21.7 (H) 11.5 - 14.5 %   Platelets 174 150 - 440 K/uL   Neutrophils Relative % 93 %   Neutro Abs 3.4 1.4 - 6.5 K/uL   Lymphocytes Relative 5 %   Lymphs Abs 0.2 (L) 1.0 - 3.6 K/uL   Monocytes Relative 2 %   Monocytes Absolute 0.1 (L) 0.2 - 0.9 K/uL   Eosinophils Relative 0 %   Eosinophils Absolute 0.0 0 - 0.7 K/uL   Basophils Relative 0 %   Basophils Absolute 0.0 0 - 0.1 K/uL   Dg Chest Portable 1 View  06/22/2014   CLINICAL DATA:  Acute onset of shortness of breath and bilateral foot swelling. Generalized chest pain. Initial encounter.  EXAM: PORTABLE CHEST - 1 VIEW  COMPARISON:  Chest radiograph performed 05/16/2014, and CTA of the chest performed 06/09/2014  FINDINGS: The lungs are mildly hypoexpanded. Diffuse bilateral airspace opacification is mildly improved on the right and mildly worsened on the left. Given prior CT images, this  is concerning for lymphangitic spread of tumor, though superimposed pneumonia or pulmonary edema cannot be excluded. No definite pleural effusion or pneumothorax is seen.  The cardiomediastinal silhouette is borderline enlarged. A right-sided chest port is noted ending about the distal SVC. No acute osseous abnormalities are seen.  IMPRESSION: Lungs mildly hypoexpanded. Diffuse bilateral airspace opacification, mildly improved on the right and mildly worsened on the left. Given prior CT images, this remains concerning for lymphangitic spread of tumor, though superimposed pneumonia or pulmonary edema cannot be excluded. Borderline cardiomegaly noted.   Electronically Signed   By: Garald Balding M.D.   On: 06/22/2014 02:21    Assessment:  The patient is a 54 y.o. woman with stage IV lung cancer with known lymphangitic spread who is admitted to the intensive care unit with progressive shortness of breath.  Chest x-ray reveals diffuse bilateral airspace opacification. Differential includes progressive lymphnagitic spread of tumor, pneumonia or pulmonary edema.   Plan:   1)  Hematology/Oncology-  patient has known lymphangitic spread of her tumor based on the open lung biopsy. Treatment with Nivolumab hsas been somewhat sporadic.  I will review with radiology her films. I doubt pneumonitis secondary to Opdivo. Patient was empirically started on steroids.  Per her report, she has been on steroids frequently both in an out of the hospital due to her pulmonary exacerbations.  I discussed at length with the patient CODE STATUS issues.  At this point, she would want to be intubated if necessary. Her daughter has medical power of attorney. I encouraged her to contact her daughter and make her aware of her current hospitalization. She stated that she would also contact her sister-in-law who has been very supportive.  I discussed consult with palliative care medicine. The patient was agreeable.  Continue Eliquis for  known left common femoral vein deep veinous thrombosis.  Thank you for allowing me to participate in [name]'s care. I will follow her closely with you during her hospitalization while her oncologist, Dr. Delight Hoh, is out of town.  Lequita Asal, MD  06/23/2014, 5:21 AM

## 2014-06-23 NOTE — Consult Note (Signed)
PULMONARY / CRITICAL CARE MEDICINE   Name: [name] MRN: [id number] DOB: [birth year]    ADMISSION DATE:  06/22/2014    CHIEF COMPLAINT:  SOB   HISTORY OF PRESENT ILLNESS   54 yo white female with end stage lung cancer admitted to ICU for acute hypoxic resp failure, patient alert and awake, but in resp distress, placed on high flow [place] 75%, patient placed on ABX and IV steroids and BD therapy, patient with progressive infiltrates on CT chest  Patient still with SOB, remains hypoxic, I have discussed with her regarding CODE status and she wishes to be placed on ventilator  Prognosis is very POOR        PAST MEDICAL HISTORY    :  Past Medical History  Diagnosis Date  . Pneumonia   . Lung cancer   . DVT (deep venous thrombosis)   . Urinary incontinence   . HTN (hypertension)   . Chronic diastolic congestive heart failure    Past Surgical History  Procedure Laterality Date  . Video bronchoscopy Bilateral 01/14/2013    Procedure: VIDEO BRONCHOSCOPY WITHOUT FLUORO;  Surgeon: Wilhelmina Mcardle, MD;  Location: [facility] ENDOSCOPY;  Service: Cardiopulmonary;  Laterality: Bilateral;  . Video bronchoscopy N/A 01/27/2013    Procedure: VIDEO BRONCHOSCOPY;  Surgeon: Ivin Poot, MD;  Location: [place];  Service: Thoracic;  Laterality: N/A;  . Video assisted thoracoscopy Right 01/27/2013    Procedure: VIDEO ASSISTED THORACOSCOPY;  Surgeon: Ivin Poot, MD;  Location: [place];  Service: Thoracic;  Laterality: Right;  . Lung biopsy Right 01/27/2013    Procedure: LUNG BIOPSY;  Surgeon: Ivin Poot, MD;  Location: [place];  Service: Thoracic;  Laterality: Right;  . Pericardial tap N/A 01/11/2013    Procedure: PERICARDIAL TAP;  Surgeon: Blane Ohara, MD;  Location: [facility] CATH LAB;  Service: Cardiovascular;  Laterality: N/A;  . Bladder surgery     Prior to Admission medications   Medication Sig Start Date End Date Taking? Authorizing Provider  apixaban (ELIQUIS) 5 MG TABS tablet  Take 1 tablet (5 mg total) by mouth 2 (two) times daily. 05/25/14  Yes Epifanio Lesches, MD  conjugated estrogens (PREMARIN) vaginal cream Place 1 Applicatorful vaginally 3 (three) times a week. Pt uses on Monday, Wednesday, and Friday.   Yes Historical Provider, MD  dronabinol (MARINOL) 5 MG capsule Take 5 mg by mouth 2 (two) times daily.    Yes Historical Provider, MD  fentaNYL (DURAGESIC - DOSED MCG/HR) 100 MCG/HR Place 1 patch (100 mcg total) onto the skin every 3 (three) days. Patient taking differently: Place 100 mcg onto the skin every 3 (three) days. Pt uses in addition to the 59mg patch. 06/10/14  Yes TLloyd Huger MD  fentaNYL (DURAGESIC - DOSED MCG/HR) 25 MCG/HR patch Place 1 patch (25 mcg total) onto the skin every 3 (three) days. Patient taking differently: Place 25 mcg onto the skin every 3 (three) days. Pt uses in addition to the 107m patch. 06/10/14  Yes TiLloyd HugerMD  furosemide (LASIX) 40 MG tablet Take 1 tablet (40 mg total) by mouth 2 (two) times daily. 06/08/14  Yes TiLloyd HugerMD  gabapentin (NEURONTIN) 300 MG capsule Take 1 capsule (300 mg total) by mouth 3 (three) times daily. 05/20/14  Yes TiLloyd HugerMD  HYDROmorphone (DILAUDID) 4 MG tablet Take 1 tablet (4 mg total) by mouth every 4 (four) hours as needed for severe pain. 06/10/14  Yes TiLloyd HugerMD  Ipratropium-Albuterol (COMBIVENT) 20-100 MCG/ACT AERS respimat Inhale 1 puff into the lungs 4 (four) times daily as needed. For shortness of breath and wheezing Patient taking differently: Inhale 1 puff into the lungs 4 (four) times daily as needed for wheezing or shortness of breath.  05/20/14  Yes Lloyd Huger, MD  metoCLOPramide (REGLAN) 5 MG tablet Take 5 mg by mouth 4 (four) times daily as needed for nausea.   Yes Historical Provider, MD  metoprolol tartrate (LOPRESSOR) 25 MG tablet Take 1 tablet (25 mg total) by mouth 3 (three) times daily. 05/20/14  Yes Lloyd Huger, MD   omeprazole (PRILOSEC) 20 MG capsule Take 20 mg by mouth daily.   Yes Historical Provider, MD  ondansetron (ZOFRAN) 8 MG tablet Take 8 mg by mouth every 8 (eight) hours as needed for nausea or vomiting.   Yes Historical Provider, MD  potassium chloride SA (K-DUR,KLOR-CON) 20 MEQ tablet Take 1 tablet (20 mEq total) by mouth 2 (two) times daily. 05/20/14  Yes Lloyd Huger, MD  promethazine (PHENERGAN) 25 MG tablet Take 1 tablet (25 mg total) by mouth every 6 (six) hours as needed. For nausea and vomiting. Patient taking differently: Take 25 mg by mouth every 6 (six) hours as needed for nausea or vomiting.  06/10/14  Yes Lloyd Huger, MD  scopolamine (TRANSDERM-SCOP) 1 MG/3DAYS Place 1 patch (1.5 mg total) onto the skin every 3 (three) days. 06/08/14  Yes Lloyd Huger, MD   No Known Allergies   FAMILY HISTORY   Family History  Problem Relation Age of Onset  . Coronary artery disease Mother 92  . Lung cancer Mother   . Prostate cancer Father   . Breast cancer Mother   . Heart attack Mother       SOCIAL HISTORY    reports that she has quit smoking. She has never used smokeless tobacco. She reports that she drinks alcohol. She reports that she does not use illicit drugs.  Review of Systems  Constitutional: Positive for malaise/fatigue and diaphoresis. Negative for fever, chills and weight loss.  HENT: Negative for congestion and hearing loss.   Eyes: Negative for blurred vision and double vision.  Respiratory: Positive for cough, shortness of breath and wheezing.   Cardiovascular: Negative for chest pain, palpitations and orthopnea.  Gastrointestinal: Positive for nausea and vomiting. Negative for heartburn, abdominal pain, diarrhea, constipation and blood in stool.  Genitourinary: Negative for dysuria and urgency.  Musculoskeletal: Negative for myalgias, back pain and neck pain.  Skin: Negative for rash.  Neurological: Negative for dizziness, tingling, tremors, weakness  and headaches.  Endo/Heme/Allergies: Does not bruise/bleed easily.  Psychiatric/Behavioral: Negative for depression, suicidal ideas and substance abuse.  All other systems reviewed and are negative.     VITAL SIGNS    Temp:  [97.8 F (36.6 C)-98.2 F (36.8 C)] 97.8 F (36.6 C) (06/22 0741) Pulse Rate:  [94-121] 112 (06/22 0900) Resp:  [15-27] 16 (06/22 0900) BP: (92-112)/(64-81) 95/72 mmHg (06/22 0900) SpO2:  [86 %-100 %] 95 % (06/22 0939) FiO2 (%):  [85 %] 85 % (06/22 0939) HEMODYNAMICS:   VENTILATOR SETTINGS: Vent Mode:  [-]  FiO2 (%):  [85 %] 85 % INTAKE / OUTPUT:  Intake/Output Summary (Last 24 hours) at 06/23/14 1046 Last data filed at 06/23/14 0600  Gross per 24 hour  Intake    840 ml  Output    870 ml  Net    -30 ml       PHYSICAL EXAM  Physical Exam  Constitutional: She is oriented to person, place, and time. She appears distressed.  HENT:  Head: Normocephalic and atraumatic.  Mouth/Throat: Oropharynx is clear and moist.  Eyes: Conjunctivae and EOM are normal. Pupils are equal, round, and reactive to light.  Neck: Normal range of motion. No tracheal deviation present.  Cardiovascular: Normal rate and regular rhythm.   No murmur heard. Pulmonary/Chest: She is in respiratory distress. She has rales.  Abdominal: Soft. Bowel sounds are normal.  Musculoskeletal: She exhibits no edema.  Neurological: She is alert and oriented to person, place, and time.  Skin: Skin is warm. No rash noted. She is diaphoretic.       LABS   LABS:  CBC  Recent Labs Lab 06/22/14 0123 06/22/14 0908 06/23/14 0102  WBC 4.7 4.2 3.7  HGB 10.3* 10.7* 10.2*  HCT 32.0* 32.9* 31.6*  PLT 189 163 174   Coag's No results for input(s): APTT, INR in the last 168 hours. BMET  Recent Labs Lab 06/22/14 0123 06/22/14 0908 06/23/14 0102  NA 136 134* 137  K 2.5* 2.8* 3.7  CL 95* 94* 95*  CO2 30 33* 33*  BUN 13 9 10   CREATININE 0.70 0.66 0.63  GLUCOSE 108* 117* 162*    Electrolytes  Recent Labs Lab 06/22/14 0123 06/22/14 0908 06/23/14 0102  CALCIUM 7.6* 7.3* 7.4*  MG  --  1.5* 2.1   Sepsis Markers  Recent Labs Lab 06/22/14 0510  LATICACIDVEN 0.9   ABG No results for input(s): PHART, PCO2ART, PO2ART in the last 168 hours. Liver Enzymes  Recent Labs Lab 06/22/14 0123  AST 20  ALT 20  ALKPHOS 65  BILITOT 1.1  ALBUMIN 2.5*   Cardiac Enzymes  Recent Labs Lab 06/22/14 0908 06/22/14 1527 06/23/14 0102  TROPONINI <0.03 <0.03 0.08*   Glucose No results for input(s): GLUCAP in the last 168 hours.   Recent Results (from the past 240 hour(s))  Urine culture     Status: None (Preliminary result)   Collection Time: 06/22/14  8:34 AM  Result Value Ref Range Status   Specimen Description URINE, CLEAN CATCH  Final   Special Requests NONE  Final   Culture NO GROWTH < 24 HOURS  Final   Report Status PENDING  Incomplete  Culture, blood (routine x 2)     Status: None (Preliminary result)   Collection Time: 06/22/14  1:04 PM  Result Value Ref Range Status   Specimen Description BLOOD  Final   Special Requests Immunocompromised  Final   Culture NO GROWTH < 24 HOURS  Final   Report Status PENDING  Incomplete  Culture, blood (routine x 2)     Status: None (Preliminary result)   Collection Time: 06/22/14  1:17 PM  Result Value Ref Range Status   Specimen Description BLOOD  Final   Special Requests Immunocompromised  Final   Culture NO GROWTH < 24 HOURS  Final   Report Status PENDING  Incomplete     Current facility-administered medications:  .  acetaminophen (TYLENOL) tablet 650 mg, 650 mg, Oral, Q6H PRN, Demetrios Loll, MD, 650 mg at 06/22/14 0818 .  apixaban (ELIQUIS) tablet 5 mg, 5 mg, Oral, BID, Lance Coon, MD, 5 mg at 06/23/14 1024 .  budesonide (PULMICORT) nebulizer solution 0.5 mg, 0.5 mg, Nebulization, BID, Flora Lipps, MD, 0.5 mg at 06/23/14 0817 .  fentaNYL (DURAGESIC - dosed mcg/hr) 100 mcg, 100 mcg, Transdermal, Q72H,  Lance Coon, MD, 100 mcg at 06/22/14 0636 .  fentaNYL (DURAGESIC -  dosed mcg/hr) patch 25 mcg, 25 mcg, Transdermal, Q72H, Lance Coon, MD, 25 mcg at 06/22/14 0636 .  furosemide (LASIX) tablet 40 mg, 40 mg, Oral, BID, Lance Coon, MD, 40 mg at 06/23/14 0759 .  ipratropium-albuterol (DUONEB) 0.5-2.5 (3) MG/3ML nebulizer solution 3 mL, 3 mL, Nebulization, Q4H, Flora Lipps, MD, 3 mL at 06/23/14 0817 .  methylPREDNISolone sodium succinate (SOLU-MEDROL) 40 mg/mL injection 40 mg, 40 mg, Intravenous, Q12H, Flora Lipps, MD, 40 mg at 06/23/14 1024 .  metoprolol tartrate (LOPRESSOR) tablet 25 mg, 25 mg, Oral, BID, Lance Coon, MD, 25 mg at 06/22/14 2100 .  ondansetron (ZOFRAN) injection 4 mg, 4 mg, Intravenous, Q6H PRN, Theodoro Grist, MD, 4 mg at 06/23/14 1024 .  ondansetron (ZOFRAN) tablet 8 mg, 8 mg, Oral, Q8H PRN, Lance Coon, MD, 8 mg at 06/22/14 0949 .  oxyCODONE (Oxy IR/ROXICODONE) immediate release tablet 5 mg, 5 mg, Oral, Q4H PRN, Demetrios Loll, MD, 5 mg at 06/23/14 1024 .  pantoprazole (PROTONIX) EC tablet 40 mg, 40 mg, Oral, Daily, Lance Coon, MD, 40 mg at 06/23/14 1024 .  piperacillin-tazobactam (ZOSYN) IVPB 3.375 g, 3.375 g, Intravenous, Q8H, Lance Coon, MD, 3.375 g at 06/23/14 0759 .  promethazine (PHENERGAN) injection 12.5-25 mg, 12.5-25 mg, Intravenous, Q6H PRN, Lance Coon, MD, 25 mg at 06/23/14 0759 .  senna-docusate (Senokot-S) tablet 1 tablet, 1 tablet, Oral, QHS PRN, Lance Coon, MD .  sodium chloride 0.9 % injection 3 mL, 3 mL, Intravenous, Q12H, Lance Coon, MD, 3 mL at 06/22/14 2200 .  sulfamethoxazole-trimethoprim (BACTRIM,SEPTRA) 400-80 MG per tablet 2 tablet, 2 tablet, Oral, Q12H, Flora Lipps, MD, 2 tablet at 06/23/14 1024 .  vancomycin (VANCOCIN) IVPB 1000 mg/200 mL premix, 1,000 mg, Intravenous, Q12H, Lance Coon, MD, 1,000 mg at 06/23/14 0054  Facility-Administered Medications Ordered in Other Encounters:  .  sodium chloride 0.9 % injection 10 mL, 10 mL, Intracatheter,  PRN, Lloyd Huger, MD, 10 mL at 05/06/14 1435  IMAGING    No results found.                      ASSESSMENT/PLAN   54 yo white female with end stage Lung cancer admitted to ICU for progressive hypoxic resp failure likely due to progressive lymphangitic spread of lung cancer with possible underlying interstitial pneumonia/pneumonitis    PULMONARY -.Respiratory Failure with met. cancer and possible pneumonia/pneumonitis -high flow/ NRB/bipap as needed -continue Bronchodilator Therapy -high risk for intubation -continue IV abx(day 3) and IV steroids(day 2) -obtain sputum and blood cultures   CARDIOVASCULAR -continue ICU monitoring -vasopressors if neded  RENAL -follow chem 7 -follow UO -continue Foley Catheter-assess need  GASTROINTESTINAL -Nausea and vomiting -supportive care -IV pain meds as needed  HEMATOLOGIC -follow oncology recs -follow h/h  INFECTIOUS -emperic abx therapy Await cultures  ENDOCRINE - ICU hypoglycemic\Hyperglycemia protocol  Prognosis is very poor. Recommend DNR status    I have personally obtained a history, examined the patient, evaluated laboratory and imaging results, formulated the assessment and plan and placed orders.  The Patient requires high complexity decision making for assessment and support, frequent evaluation and titration of therapies, application of advanced monitoring technologies and extensive interpretation of multiple databases. Critical Care Time devoted to patient care services described in this note is 65 minutes.   Overall, patient is critically ill, prognosis is guarded. Patient at high risk for cardiac arrest and death.   Corrin Parker, M.D. Pulmonary & [place] Director Intensive Care Unit  06/23/2014, 10:46 AM

## 2014-06-23 NOTE — Progress Notes (Signed)
Pt is a x o x3. C/o nausea and pain PRN meds given as ordered.VSS. Getting o2 via 100% NRB. Sao2 96%. Resting in bed without any distress.

## 2014-06-23 NOTE — Progress Notes (Addendum)
[facility] [facility] [place] NAME: [name]    MR#:  [id number]  DATE OF BIRTH:  [birth year]  SUBJECTIVE:  CHIEF COMPLAINT:   Chief Complaint  Patient presents with  . Shortness of Breath   Still cough and shortness of breath.  On O2 HF.  REVIEW OF SYSTEMS:  CONSTITUTIONAL: No fever, has generalized  weakness.  EYES: No blurred or double vision.  EARS, NOSE, AND THROAT: No tinnitus or ear pain.  RESPIRATORY: Positive  cough and shortness of breath, no  wheezing or hemoptysis.  CARDIOVASCULAR: No chest pain, orthopnea, edema.  GASTROINTESTINAL: No nausea, vomiting, diarrhea or abdominal pain.  GENITOURINARY: No dysuria, hematuria.  ENDOCRINE: No polyuria, nocturia,  HEMATOLOGY: No anemia, easy bruising or bleeding SKIN: No rash or lesion. MUSCULOSKELETAL: No joint pain or arthritis.   NEUROLOGIC: No tingling, numbness, weakness.  PSYCHIATRY: No anxiety or depression.   DRUG ALLERGIES:   No Known Allergies  VITALS:  Blood pressure 95/72, pulse 112, temperature 97.8 F (36.6 C), temperature source Oral, resp. rate 16, height '5\' 6"'$  (1.676 m), weight 68.04 kg (150 lb), SpO2 95 %.  PHYSICAL EXAMINATION:  GENERAL:  54 y.o.-year-old patient lying in the bed with no acute distress.  EYES: Pupils equal, round, reactive to light and accommodation. No scleral icterus. Extraocular muscles intact.  HEENT: Head atraumatic, normocephalic. Oropharynx and nasopharynx clear.  NECK:  Supple, no jugular venous distention. No thyroid enlargement, no tenderness.  LUNGS: Normal breath sounds bilaterally, no wheezing, bilateral crackles. No use of accessory muscles of respiration.  CARDIOVASCULAR: S1, S2 normal. No murmurs, rubs, or gallops.  ABDOMEN: Soft, nontender, nondistended. Bowel sounds present. No organomegaly or mass.  EXTREMITIES: Bilateral lower extremity trace edema, no cyanosis, or clubbing.  NEUROLOGIC: Cranial nerves II through XII are  intact. Muscle strength 5/5 in all extremities. Sensation intact. Gait not checked.  PSYCHIATRIC: The patient is alert and oriented x 3.  SKIN: No obvious rash, lesion, or ulcer.    LABORATORY PANEL:   CBC  Recent Labs Lab 06/23/14 0102  WBC 3.7  HGB 10.2*  HCT 31.6*  PLT 174   ------------------------------------------------------------------------------------------------------------------  Chemistries   Recent Labs Lab 06/22/14 0123  06/23/14 0102  NA 136  < > 137  K 2.5*  < > 3.7  CL 95*  < > 95*  CO2 30  < > 33*  GLUCOSE 108*  < > 162*  BUN 13  < > 10  CREATININE 0.70  < > 0.63  CALCIUM 7.6*  < > 7.4*  MG  --   < > 2.1  AST 20  --   --   ALT 20  --   --   ALKPHOS 65  --   --   BILITOT 1.1  --   --   < > = values in this interval not displayed. ------------------------------------------------------------------------------------------------------------------  Cardiac Enzymes  Recent Labs Lab 06/23/14 0102  TROPONINI 0.08*   ------------------------------------------------------------------------------------------------------------------  RADIOLOGY:  Dg Chest Portable 1 View  06/22/2014   CLINICAL DATA:  Acute onset of shortness of breath and bilateral foot swelling. Generalized chest pain. Initial encounter.  EXAM: PORTABLE CHEST - 1 VIEW  COMPARISON:  Chest radiograph performed 05/16/2014, and CTA of the chest performed 06/09/2014  FINDINGS: The lungs are mildly hypoexpanded. Diffuse bilateral airspace opacification is mildly improved on the right and mildly worsened on the left. Given prior CT images, this is concerning for lymphangitic spread of tumor, though superimposed pneumonia  or pulmonary edema cannot be excluded. No definite pleural effusion or pneumothorax is seen.  The cardiomediastinal silhouette is borderline enlarged. A right-sided chest port is noted ending about the distal SVC. No acute osseous abnormalities are seen.  IMPRESSION: Lungs mildly  hypoexpanded. Diffuse bilateral airspace opacification, mildly improved on the right and mildly worsened on the left. Given prior CT images, this remains concerning for lymphangitic spread of tumor, though superimposed pneumonia or pulmonary edema cannot be excluded. Borderline cardiomegaly noted.   Electronically Signed   By: Garald Balding M.D.   On: 06/22/2014 02:21    EKG:   Orders placed or performed during the hospital encounter of 06/22/14  . EKG 12-Lead  . EKG 12-Lead    ASSESSMENT AND PLAN:   Acute respiratory failure with hypoxemia. Continue HF O2, Solu-Medrol and  nebulizer treatment. Follow-up pulmonary consult.  Sepsis with Pneumonia  Continue Zosyn and vancomycin. Continue  nebulizer treatment.  Elevated troponin. Due to demand ischemia. Continue eliquis.  Adenocarcinoma of lung, stage 4. I will request palliative care consult again since since oncologist recommended and the patient is agreeable.    Hypokalemia -improved after repleting potassium.  Hypomagnesemia.improved after giving supplement.   DVT (deep venous thrombosis) - continue on full dose anticoagulation with Eliquis.  Chronic diastolic congestive heart failure. continue home dose by mouth Lasix.     All the records are reviewed and case discussed with Care Management/Social Workerr. Management plans discussed with the patient, the patient's daughter and they are in agreement.  CODE STATUS: Full Code  TOTAL CRITICAL TIME TAKING CARE OF THIS PATIENT: 48 minutes.   POSSIBLE D/C IN 5 DAYS, DEPENDING ON CLINICAL CONDITION.   Demetrios Loll M.D on 06/23/2014 at 10:46 AM  Between 7am to 6pm - Pager - [phone]  After 6pm go to [email or web address] - password [email or web address] [facility]  Office  [phone]  CC: Primary care physician; Pcp Not In System

## 2014-06-24 ENCOUNTER — Inpatient Hospital Stay: Payer: Medicaid Other

## 2014-06-24 ENCOUNTER — Inpatient Hospital Stay: Payer: Medicaid Other | Admitting: Hematology and Oncology

## 2014-06-24 DIAGNOSIS — R079 Chest pain, unspecified: Secondary | ICD-10-CM

## 2014-06-24 DIAGNOSIS — R0602 Shortness of breath: Secondary | ICD-10-CM

## 2014-06-24 DIAGNOSIS — R32 Unspecified urinary incontinence: Secondary | ICD-10-CM

## 2014-06-24 DIAGNOSIS — I2699 Other pulmonary embolism without acute cor pulmonale: Secondary | ICD-10-CM

## 2014-06-24 DIAGNOSIS — J189 Pneumonia, unspecified organism: Secondary | ICD-10-CM

## 2014-06-24 DIAGNOSIS — I82412 Acute embolism and thrombosis of left femoral vein: Secondary | ICD-10-CM

## 2014-06-24 LAB — VANCOMYCIN, TROUGH: VANCOMYCIN TR: 18 ug/mL (ref 10–20)

## 2014-06-24 LAB — CBC WITH DIFFERENTIAL/PLATELET
Basophils Absolute: 0 10*3/uL (ref 0–0.1)
Basophils Relative: 0 %
Eosinophils Absolute: 0 10*3/uL (ref 0–0.7)
Eosinophils Relative: 0 %
HCT: 32.6 % — ABNORMAL LOW (ref 35.0–47.0)
Hemoglobin: 10.3 g/dL — ABNORMAL LOW (ref 12.0–16.0)
LYMPHS PCT: 5 %
Lymphs Abs: 0.3 10*3/uL — ABNORMAL LOW (ref 1.0–3.6)
MCH: 27.4 pg (ref 26.0–34.0)
MCHC: 31.6 g/dL — AB (ref 32.0–36.0)
MCV: 86.7 fL (ref 80.0–100.0)
MONO ABS: 0.3 10*3/uL (ref 0.2–0.9)
MONOS PCT: 5 %
Neutro Abs: 5.3 10*3/uL (ref 1.4–6.5)
Neutrophils Relative %: 90 %
Platelets: 200 10*3/uL (ref 150–440)
RBC: 3.76 MIL/uL — ABNORMAL LOW (ref 3.80–5.20)
RDW: 23.2 % — ABNORMAL HIGH (ref 11.5–14.5)
WBC: 5.9 10*3/uL (ref 3.6–11.0)

## 2014-06-24 LAB — BASIC METABOLIC PANEL
Anion gap: 10 (ref 5–15)
BUN: 15 mg/dL (ref 6–20)
CHLORIDE: 95 mmol/L — AB (ref 101–111)
CO2: 32 mmol/L (ref 22–32)
Calcium: 7.8 mg/dL — ABNORMAL LOW (ref 8.9–10.3)
Creatinine, Ser: 0.74 mg/dL (ref 0.44–1.00)
GFR calc Af Amer: >60 mL/min (ref >60)
GFR calc non Af Amer: >60 mL/min (ref >60)
GLUCOSE: 140 mg/dL — AB (ref 65–99)
POTASSIUM: 3.9 mmol/L (ref 3.5–5.1)
SODIUM: 137 mmol/L (ref 135–145)

## 2014-06-24 LAB — MAGNESIUM: MAGNESIUM: 2 mg/dL (ref 1.7–2.4)

## 2014-06-24 LAB — URINE CULTURE

## 2014-06-24 LAB — GLUCOSE, CAPILLARY: Glucose-Capillary: 90 mg/dL (ref 65–99)

## 2014-06-24 LAB — PHOSPHORUS: Phosphorus: 2.6 mg/dL (ref 2.5–4.6)

## 2014-06-24 MED ORDER — METHYLPREDNISOLONE SODIUM SUCC 40 MG IJ SOLR
40.0000 mg | Freq: Every day | INTRAMUSCULAR | Status: DC
  Administered 2014-06-25 – 2014-06-27 (×3): 40 mg via INTRAVENOUS
  Filled 2014-06-24 (×3): qty 1

## 2014-06-24 MED ORDER — DRONABINOL 2.5 MG PO CAPS: 2.5000 mg | ORAL_CAPSULE | Freq: Two times a day (BID) | ORAL | Status: DC

## 2014-06-24 MED ORDER — FENTANYL 12 MCG/HR TD PT72
50.0000 ug | MEDICATED_PATCH | TRANSDERMAL | Status: DC
  Administered 2014-06-24: 50 ug via TRANSDERMAL
  Filled 2014-06-24: qty 1

## 2014-06-24 MED ORDER — SODIUM CHLORIDE 0.9 % IJ SOLN
INTRAMUSCULAR | Status: AC
  Filled 2014-06-24: qty 10

## 2014-06-24 MED ORDER — PROMETHAZINE HCL 25 MG/ML IJ SOLN
12.5000 mg | INTRAMUSCULAR | Status: DC | PRN
  Administered 2014-06-24 – 2014-06-28 (×23): 25 mg via INTRAVENOUS
  Filled 2014-06-24 (×23): qty 1

## 2014-06-24 NOTE — Progress Notes (Signed)
ANTIBIOTIC CONSULT NOTE - INITIAL  Pharmacy Consult for Vancomycin and Zosyn  Indication: pneumonia  No Known Allergies  Patient Measurements: Height: '5\' 6"'$  (167.6 cm) Weight: 150 lb (68.04 kg) IBW/kg (Calculated) : 59.3 Adjusted Body Weight: 62.78kg  Vital Signs: Temp: 98.7 F (37.1 C) (06/22 1900) Temp Source: Oral (06/22 1900) BP: 87/70 mmHg (06/23 0200) Pulse Rate: 91 (06/23 0200) Intake/Output from previous day: 06/22 0701 - 06/23 0700 In: 150 [IV Piggyback:150] Out: 700 [Urine:700] Intake/Output from this shift: Total I/O In: 150 [IV Piggyback:150] Out: -   Labs:  Recent Labs  06/22/14 0908 06/23/14 0102 06/24/14 0120  WBC 4.2 3.7 5.9  HGB 10.7* 10.2* 10.3*  PLT 163 174 200  CREATININE 0.66 0.63 0.74   Estimated Creatinine Clearance: 77.9 mL/min (by C-G formula based on Cr of 0.74).  Recent Labs  06/24/14 0120  [place] [facility]     Microbiology: Recent Results (from the past 720 hour(s))  Urine culture     Status: None (Preliminary result)   Collection Time: 06/22/14  8:34 AM  Result Value Ref Range Status   Specimen Description URINE, CLEAN CATCH  Final   Special Requests NONE  Final   Culture NO GROWTH < 24 HOURS  Final   Report Status PENDING  Incomplete  Culture, blood (routine x 2)     Status: None (Preliminary result)   Collection Time: 06/22/14  1:04 PM  Result Value Ref Range Status   Specimen Description BLOOD  Final   Special Requests Immunocompromised  Final   Culture NO GROWTH < 24 HOURS  Final   Report Status PENDING  Incomplete  Culture, blood (routine x 2)     Status: None (Preliminary result)   Collection Time: 06/22/14  1:17 PM  Result Value Ref Range Status   Specimen Description BLOOD  Final   Special Requests Immunocompromised  Final   Culture NO GROWTH < 24 HOURS  Final   Report Status PENDING  Incomplete  Culture, expectorated sputum-assessment     Status: None   Collection Time: 06/23/14  8:00 AM  Result Value Ref  Range Status   Specimen Description SPU  Final   Special Requests Immunocompromised  Final   Sputum evaluation   Final    Sputum specimen not acceptable for testing.  Please recollect.   CTJ TO [facility] AT 9924 6.22.16    Report Status 06/23/2014 FINAL  Final    Medical History: Past Medical History  Diagnosis Date  . Pneumonia   . Lung cancer   . DVT (deep venous thrombosis)   . Urinary incontinence   . HTN (hypertension)   . Chronic diastolic congestive heart failure     Medications:  Anti-infectives    Start     Dose/Rate Route Frequency Ordered Stop   06/23/14 0100  vancomycin (VANCOCIN) IVPB 1000 mg/200 mL premix     1,000 mg 200 mL/hr over 60 Minutes Intravenous Every 12 hours 06/22/14 0617     06/22/14 2200  sulfamethoxazole-trimethoprim (BACTRIM,SEPTRA) 400-80 MG per tablet 2 tablet     2 tablet Oral Every 12 hours 06/22/14 1307     06/22/14 1300  vancomycin (VANCOCIN) IVPB 1000 mg/200 mL premix     1,000 mg 200 mL/hr over 60 Minutes Intravenous  Once 06/22/14 0617 06/22/14 1428   06/22/14 1230  sulfamethoxazole-trimethoprim (BACTRIM,SEPTRA) 400-80 MG per tablet 1 tablet  Status:  Discontinued     1 tablet Oral Every 12 hours 06/22/14 1216 06/22/14 1307   06/22/14  0700  vancomycin (VANCOCIN) IVPB 1000 mg/200 mL premix     1,000 mg 200 mL/hr over 60 Minutes Intravenous  Once 06/22/14 0617 06/22/14 0749   06/22/14 0700  piperacillin-tazobactam (ZOSYN) IVPB 3.375 g     3.375 g 12.5 mL/hr over 240 Minutes Intravenous Every 8 hours 06/22/14 0618       Assessment: 54 y.o. female who presents with progressive shortness of breath for the last 3 days. Patient has known lung cancer.    Goal of Therapy:  Vancomycin trough level 15-20 mcg/ml. Trough ordered for 6/23 at 00:30.  Plan:  Ordered Vancomycin 1g IV at 07:00, followed by 1g at 13:00 for stacked dosing.  Continue with Vancomycin 1g IV Q12H to begin 6/22 at 01:00.    Ordered Zosyn 3.375g EI Q8H.    6/23  @ 01:20 Trough = 18 mcg/ml.  Continue current dosing.   Pharmacy will follow and adjust per consult.     Maela Takeda K, RPh 06/24/2014,2:39 AM

## 2014-06-24 NOTE — Progress Notes (Signed)
Right side-lung

## 2014-06-24 NOTE — Progress Notes (Signed)
[facility] NAME: [name]    MR#:  [id number]  DATE OF BIRTH:  [birth year]  SUBJECTIVE:  CHIEF COMPLAINT:   Chief Complaint  Patient presents with  . Shortness of Breath   Nausea, weakness, cough and shortness of breath.  On O2 HF.  REVIEW OF SYSTEMS:  CONSTITUTIONAL: No fever, has generalized  weakness.  EYES: No blurred or double vision.  EARS, NOSE, AND THROAT: No tinnitus or ear pain.  RESPIRATORY: Positive  cough and shortness of breath, no  wheezing or hemoptysis.  CARDIOVASCULAR: No chest pain, orthopnea, edema.  GASTROINTESTINAL: No nausea, vomiting, diarrhea or abdominal pain.  GENITOURINARY: No dysuria, hematuria.  ENDOCRINE: No polyuria, nocturia,  HEMATOLOGY: No anemia, easy bruising or bleeding SKIN: No rash or lesion. MUSCULOSKELETAL: No joint pain or arthritis.   NEUROLOGIC: No tingling, numbness, weakness.  PSYCHIATRY: No anxiety or depression.   DRUG ALLERGIES:   No Known Allergies  VITALS:  Blood pressure 89/49, pulse 99, temperature 97.6 F (36.4 C), temperature source Oral, resp. rate 20, height '5\' 6"'$  (1.676 m), weight 68.04 kg (150 lb), SpO2 100 %.  PHYSICAL EXAMINATION:  GENERAL:  54 y.o.-year-old patient lying in the bed with no acute distress.  EYES: Pupils equal, round, reactive to light and accommodation. No scleral icterus. Extraocular muscles intact.  HEENT: Head atraumatic, normocephalic. Oropharynx and nasopharynx clear.  NECK:  Supple, no jugular venous distention. No thyroid enlargement, no tenderness.  LUNGS: Normal breath sounds bilaterally, no wheezing, bilateral mild crackles. No use of accessory muscles of respiration.  CARDIOVASCULAR: S1, S2 normal. No murmurs, rubs, or gallops.  ABDOMEN: Soft, nontender, nondistended. Bowel sounds present. No organomegaly or mass.  EXTREMITIES: no edema, no cyanosis, or clubbing.  NEUROLOGIC: Cranial nerves II through XII are intact.  Muscle strength 5/5 in all extremities. Sensation intact. Gait not checked.  PSYCHIATRIC: The patient is alert and oriented x 3.  SKIN: No obvious rash, lesion, or ulcer.    LABORATORY PANEL:   CBC  Recent Labs Lab 06/24/14 0120  WBC 5.9  HGB 10.3*  HCT 32.6*  PLT 200   ------------------------------------------------------------------------------------------------------------------  Chemistries   Recent Labs Lab 06/22/14 0123  06/24/14 0120  NA 136  < > 137  K 2.5*  < > 3.9  CL 95*  < > 95*  CO2 30  < > 32  GLUCOSE 108*  < > 140*  BUN 13  < > 15  CREATININE 0.70  < > 0.74  CALCIUM 7.6*  < > 7.8*  MG  --   < > 2.0  AST 20  --   --   ALT 20  --   --   ALKPHOS 65  --   --   BILITOT 1.1  --   --   < > = values in this interval not displayed. ------------------------------------------------------------------------------------------------------------------  Cardiac Enzymes  Recent Labs Lab 06/23/14 0102  TROPONINI 0.08*   ------------------------------------------------------------------------------------------------------------------  RADIOLOGY:  No results found.  EKG:   Orders placed or performed during the hospital encounter of 06/22/14  . EKG 12-Lead  . EKG 12-Lead    ASSESSMENT AND PLAN:   Acute respiratory failure with hypoxemia. Try to wean off HF O2,  Cont Solu-Medrol and  nebulizer treatment. Follow-up Dr. Mortimer Fries.  Sepsis with Pneumonia  Continue Zosyn and vancomycin. Continue  nebulizer treatment.  Elevated troponin. Due to demand ischemia. Continue eliquis.  Adenocarcinoma of lung, stage 4. Per Dr. Ermalinda Memos, palliative care  consult,  The patient made it very clear that intubation would only be short term and that she would never want to live on a vent, eg trach.   Hypokalemia -improved after repleting potassium.  Hypomagnesemia.improved after giving supplement.   DVT (deep venous thrombosis) - continue on full dose anticoagulation with  Eliquis.  Chronic diastolic congestive heart failure. continue home dose by mouth Lasix.     All the records are reviewed and case discussed with Care Management/Social Workerr. Management plans discussed with the patient, the patient's sister and they are in agreement.  CODE STATUS: Full Code  TOTAL CRITICAL TIME TAKING CARE OF THIS PATIENT: 42 minutes.   POSSIBLE D/C IN 5 DAYS, DEPENDING ON CLINICAL CONDITION.   Demetrios Loll M.D on 06/24/2014 at 11:51 AM  Between 7am to 6pm - Pager - [phone]  After 6pm go to [email or web address] - password [email or web address] [facility]  Office  [phone]  CC: Primary care physician; Pcp Not In System

## 2014-06-24 NOTE — Progress Notes (Signed)
Pt is a x o x3. C/o pain and nausea. PRn meds given as ordered. VSS. O2 on HF. Resting comfortably in bed without any distress.

## 2014-06-24 NOTE — Consult Note (Signed)
PULMONARY / CRITICAL CARE MEDICINE   Name: [name] MRN: [id number] DOB: [birth year]    ADMISSION DATE:  06/22/2014    CHIEF COMPLAINT:  SOB   HISTORY OF PRESENT ILLNESS   54 yo white female with end stage lung cancer admitted to ICU for acute hypoxic resp failure, patient alert and awake, but in resp distress,patient placed on ABX and IV steroids and BD therapy, patient with progressive infiltrates on CT chest  Patient still with SOB, remains hypoxic, I have discussed with her regarding CODE status and she wishes to be placed on ventilator  Prognosis is very POOR, remains SOB, fio2 at 60%        PAST MEDICAL HISTORY    :  Past Medical History  Diagnosis Date  . Pneumonia   . Lung cancer   . DVT (deep venous thrombosis)   . Urinary incontinence   . HTN (hypertension)   . Chronic diastolic congestive heart failure    Past Surgical History  Procedure Laterality Date  . Video bronchoscopy Bilateral 01/14/2013    Procedure: VIDEO BRONCHOSCOPY WITHOUT FLUORO;  Surgeon: Wilhelmina Mcardle, MD;  Location: [facility] ENDOSCOPY;  Service: Cardiopulmonary;  Laterality: Bilateral;  . Video bronchoscopy N/A 01/27/2013    Procedure: VIDEO BRONCHOSCOPY;  Surgeon: Ivin Poot, MD;  Location: [place];  Service: Thoracic;  Laterality: N/A;  . Video assisted thoracoscopy Right 01/27/2013    Procedure: VIDEO ASSISTED THORACOSCOPY;  Surgeon: Ivin Poot, MD;  Location: [place];  Service: Thoracic;  Laterality: Right;  . Lung biopsy Right 01/27/2013    Procedure: LUNG BIOPSY;  Surgeon: Ivin Poot, MD;  Location: [place];  Service: Thoracic;  Laterality: Right;  . Pericardial tap N/A 01/11/2013    Procedure: PERICARDIAL TAP;  Surgeon: Blane Ohara, MD;  Location[facility] CATH LAB;  Service: Cardiovascular;  Laterality: N/A;  . Bladder surgery     Prior to Admission medications   Medication Sig Start Date End Date Taking? Authorizing Provider  apixaban (ELIQUIS) 5 MG TABS tablet Take 1  tablet (5 mg total) by mouth 2 (two) times daily. 05/25/14  Yes Epifanio Lesches, MD  conjugated estrogens (PREMARIN) vaginal cream Place 1 Applicatorful vaginally 3 (three) times a week. Pt uses on Monday, Wednesday, and Friday.   Yes Historical Provider, MD  dronabinol (MARINOL) 5 MG capsule Take 5 mg by mouth 2 (two) times daily.    Yes Historical Provider, MD  fentaNYL (DURAGESIC - DOSED MCG/HR) 100 MCG/HR Place 1 patch (100 mcg total) onto the skin every 3 (three) days. Patient taking differently: Place 100 mcg onto the skin every 3 (three) days. Pt uses in addition to the 31mg patch. 06/10/14  Yes TLloyd Huger MD  fentaNYL (DURAGESIC - DOSED MCG/HR) 25 MCG/HR patch Place 1 patch (25 mcg total) onto the skin every 3 (three) days. Patient taking differently: Place 25 mcg onto the skin every 3 (three) days. Pt uses in addition to the 1035m patch. 06/10/14  Yes TiLloyd HugerMD  furosemide (LASIX) 40 MG tablet Take 1 tablet (40 mg total) by mouth 2 (two) times daily. 06/08/14  Yes TiLloyd HugerMD  gabapentin (NEURONTIN) 300 MG capsule Take 1 capsule (300 mg total) by mouth 3 (three) times daily. 05/20/14  Yes TiLloyd HugerMD  HYDROmorphone (DILAUDID) 4 MG tablet Take 1 tablet (4 mg total) by mouth every 4 (four) hours as needed for severe pain. 06/10/14  Yes TiLloyd HugerMD  Ipratropium-Albuterol (  COMBIVENT) 20-100 MCG/ACT AERS respimat Inhale 1 puff into the lungs 4 (four) times daily as needed. For shortness of breath and wheezing Patient taking differently: Inhale 1 puff into the lungs 4 (four) times daily as needed for wheezing or shortness of breath.  05/20/14  Yes Lloyd Huger, MD  metoCLOPramide (REGLAN) 5 MG tablet Take 5 mg by mouth 4 (four) times daily as needed for nausea.   Yes Historical Provider, MD  metoprolol tartrate (LOPRESSOR) 25 MG tablet Take 1 tablet (25 mg total) by mouth 3 (three) times daily. 05/20/14  Yes Lloyd Huger, MD  omeprazole  (PRILOSEC) 20 MG capsule Take 20 mg by mouth daily.   Yes Historical Provider, MD  ondansetron (ZOFRAN) 8 MG tablet Take 8 mg by mouth every 8 (eight) hours as needed for nausea or vomiting.   Yes Historical Provider, MD  potassium chloride SA (K-DUR,KLOR-CON) 20 MEQ tablet Take 1 tablet (20 mEq total) by mouth 2 (two) times daily. 05/20/14  Yes Lloyd Huger, MD  promethazine (PHENERGAN) 25 MG tablet Take 1 tablet (25 mg total) by mouth every 6 (six) hours as needed. For nausea and vomiting. Patient taking differently: Take 25 mg by mouth every 6 (six) hours as needed for nausea or vomiting.  06/10/14  Yes Lloyd Huger, MD  scopolamine (TRANSDERM-SCOP) 1 MG/3DAYS Place 1 patch (1.5 mg total) onto the skin every 3 (three) days. 06/08/14  Yes Lloyd Huger, MD   No Known Allergies   FAMILY HISTORY   Family History  Problem Relation Age of Onset  . Coronary artery disease Mother 81  . Lung cancer Mother   . Prostate cancer Father   . Breast cancer Mother   . Heart attack Mother       SOCIAL HISTORY    reports that she has quit smoking. She has never used smokeless tobacco. She reports that she drinks alcohol. She reports that she does not use illicit drugs.  Review of Systems  Constitutional: Positive for malaise/fatigue and diaphoresis. Negative for fever, chills and weight loss.  HENT: Negative for congestion and hearing loss.   Eyes: Negative for blurred vision and double vision.  Respiratory: Positive for cough, shortness of breath and wheezing.   Cardiovascular: Negative for chest pain, palpitations and orthopnea.  Gastrointestinal: Positive for nausea and vomiting. Negative for heartburn, abdominal pain, diarrhea, constipation and blood in stool.  Genitourinary: Negative for dysuria and urgency.  Musculoskeletal: Negative for myalgias, back pain and neck pain.  Skin: Negative for rash.  Neurological: Negative for dizziness, tingling, tremors, weakness and  headaches.  Endo/Heme/Allergies: Does not bruise/bleed easily.  Psychiatric/Behavioral: Negative for depression, suicidal ideas and substance abuse.  All other systems reviewed and are negative.     VITAL SIGNS    Temp:  [97.6 F (36.4 C)-98.7 F (37.1 C)] 97.6 F (36.4 C) (06/23 0800) Pulse Rate:  [67-167] 109 (06/23 0800) Resp:  [8-27] 22 (06/23 0800) BP: (83-113)/(60-89) 92/65 mmHg (06/23 0800) SpO2:  [83 %-100 %] 97 % (06/23 0800) FiO2 (%):  [60 %-85 %] 69 % (06/23 0800) HEMODYNAMICS:   VENTILATOR SETTINGS: Vent Mode:  [-]  FiO2 (%):  [60 %-85 %] 69 % INTAKE / OUTPUT:  Intake/Output Summary (Last 24 hours) at 06/24/14 0928 Last data filed at 06/24/14 0825  Gross per 24 hour  Intake    700 ml  Output    700 ml  Net      0 ml  PHYSICAL EXAM   Physical Exam  Constitutional: She is oriented to person, place, and time. She appears distressed.  HENT:  Head: Normocephalic and atraumatic.  Mouth/Throat: Oropharynx is clear and moist.  Eyes: Conjunctivae and EOM are normal. Pupils are equal, round, and reactive to light.  Neck: Normal range of motion. No tracheal deviation present.  Cardiovascular: Normal rate and regular rhythm.   No murmur heard. Pulmonary/Chest: She is in respiratory distress. She has rales.  Abdominal: Soft. Bowel sounds are normal.  Musculoskeletal: She exhibits no edema.  Neurological: She is alert and oriented to person, place, and time.  Skin: Skin is warm. No rash noted. She is diaphoretic.       LABS   LABS:  CBC  Recent Labs Lab 06/22/14 0908 06/23/14 0102 06/24/14 0120  WBC 4.2 3.7 5.9  HGB 10.7* 10.2* 10.3*  HCT 32.9* 31.6* 32.6*  PLT 163 174 200   Coag's No results for input(s): APTT, INR in the last 168 hours. BMET  Recent Labs Lab 06/22/14 0908 06/23/14 0102 06/24/14 0120  NA 134* 137 137  K 2.8* 3.7 3.9  CL 94* 95* 95*  CO2 33* 33* 32  BUN 9 10 15   CREATININE 0.66 0.63 0.74  GLUCOSE 117* 162*  140*   Electrolytes  Recent Labs Lab 06/22/14 0908 06/23/14 0102 06/24/14 0120  CALCIUM 7.3* 7.4* 7.8*  MG 1.5* 2.1 2.0  PHOS  --   --  2.6   Sepsis Markers  Recent Labs Lab 06/22/14 0510  LATICACIDVEN 0.9   ABG No results for input(s): PHART, PCO2ART, PO2ART in the last 168 hours. Liver Enzymes  Recent Labs Lab 06/22/14 0123  AST 20  ALT 20  ALKPHOS 65  BILITOT 1.1  ALBUMIN 2.5*   Cardiac Enzymes  Recent Labs Lab 06/22/14 0908 06/22/14 1527 06/23/14 0102  TROPONINI <0.03 <0.03 0.08*   Glucose No results for input(s): GLUCAP in the last 168 hours.   Recent Results (from the past 240 hour(s))  Urine culture     Status: None (Preliminary result)   Collection Time: 06/22/14  8:34 AM  Result Value Ref Range Status   Specimen Description URINE, CLEAN CATCH  Final   Special Requests NONE  Final   Culture NO GROWTH < 24 HOURS  Final   Report Status PENDING  Incomplete  Culture, blood (routine x 2)     Status: None (Preliminary result)   Collection Time: 06/22/14  1:04 PM  Result Value Ref Range Status   Specimen Description BLOOD  Final   Special Requests Immunocompromised  Final   Culture NO GROWTH < 24 HOURS  Final   Report Status PENDING  Incomplete  Culture, blood (routine x 2)     Status: None (Preliminary result)   Collection Time: 06/22/14  1:17 PM  Result Value Ref Range Status   Specimen Description BLOOD  Final   Special Requests Immunocompromised  Final   Culture NO GROWTH < 24 HOURS  Final   Report Status PENDING  Incomplete  Culture, expectorated sputum-assessment     Status: None   Collection Time: 06/23/14  8:00 AM  Result Value Ref Range Status   Specimen Description SPU  Final   Special Requests Immunocompromised  Final   Sputum evaluation   Final    Sputum specimen not acceptable for testing.  Please recollect.   CTJ TO [facility] AT [phone]    Report Status 06/23/2014 FINAL  Final     Current facility-administered  medications:  .  acetaminophen (TYLENOL) tablet 650 mg, 650 mg, Oral, Q6H PRN, Demetrios Loll, MD, 650 mg at 06/22/14 0818 .  apixaban (ELIQUIS) tablet 5 mg, 5 mg, Oral, BID, Lance Coon, MD, 5 mg at 06/23/14 2140 .  budesonide (PULMICORT) nebulizer solution 0.5 mg, 0.5 mg, Nebulization, BID, Flora Lipps, MD, 0.5 mg at 06/24/14 0736 .  fentaNYL (DURAGESIC - dosed mcg/hr) 100 mcg, 100 mcg, Transdermal, Q72H, Lance Coon, MD, 100 mcg at 06/22/14 0636 .  fentaNYL (DURAGESIC - dosed mcg/hr) patch 25 mcg, 25 mcg, Transdermal, Q72H, Lance Coon, MD, 25 mcg at 06/22/14 0636 .  furosemide (LASIX) tablet 40 mg, 40 mg, Oral, BID, Lance Coon, MD, 40 mg at 06/24/14 0803 .  HYDROmorphone (DILAUDID) injection 2 mg, 2 mg, Intravenous, Q4H PRN, Flora Lipps, MD, 2 mg at 06/24/14 0804 .  ipratropium-albuterol (DUONEB) 0.5-2.5 (3) MG/3ML nebulizer solution 3 mL, 3 mL, Nebulization, Q4H, Flora Lipps, MD, 3 mL at 06/24/14 0735 .  methylPREDNISolone sodium succinate (SOLU-MEDROL) 40 mg/mL injection 40 mg, 40 mg, Intravenous, Q12H, Flora Lipps, MD, 40 mg at 06/23/14 2140 .  metoprolol tartrate (LOPRESSOR) tablet 25 mg, 25 mg, Oral, BID, Lance Coon, MD, 25 mg at 06/23/14 2140 .  ondansetron (ZOFRAN) injection 4 mg, 4 mg, Intravenous, Q6H PRN, Theodoro Grist, MD, 4 mg at 06/24/14 0803 .  ondansetron (ZOFRAN) tablet 8 mg, 8 mg, Oral, Q8H PRN, Lance Coon, MD, 8 mg at 06/22/14 0949 .  oxyCODONE (Oxy IR/ROXICODONE) immediate release tablet 5 mg, 5 mg, Oral, Q4H PRN, Demetrios Loll, MD, 5 mg at 06/23/14 1506 .  pantoprazole (PROTONIX) EC tablet 40 mg, 40 mg, Oral, Daily, Lance Coon, MD, 40 mg at 06/23/14 1024 .  piperacillin-tazobactam (ZOSYN) IVPB 3.375 g, 3.375 g, Intravenous, Q8H, Lance Coon, MD, 3.375 g at 06/24/14 0825 .  promethazine (PHENERGAN) injection 12.5-25 mg, 12.5-25 mg, Intravenous, Q6H PRN, Lance Coon, MD, 25 mg at 06/24/14 0644 .  senna-docusate (Senokot-S) tablet 1 tablet, 1 tablet, Oral, QHS PRN, Lance Coon, MD .  sodium chloride 0.9 % injection 3 mL, 3 mL, Intravenous, Q12H, Lance Coon, MD, 3 mL at 06/23/14 2140 .  sulfamethoxazole-trimethoprim (BACTRIM,SEPTRA) 400-80 MG per tablet 2 tablet, 2 tablet, Oral, Q12H, Flora Lipps, MD, 2 tablet at 06/23/14 2140 .  vancomycin (VANCOCIN) IVPB 1000 mg/200 mL premix, 1,000 mg, Intravenous, Q12H, Lance Coon, MD, 1,000 mg at 06/24/14 0241  Facility-Administered Medications Ordered in Other Encounters:  .  sodium chloride 0.9 % injection 10 mL, 10 mL, Intracatheter, PRN, Lloyd Huger, MD, 10 mL at 05/06/14 1435  IMAGING    No results found.                      ASSESSMENT/PLAN   54 yo white female with end stage Lung cancer admitted to ICU for progressive hypoxic resp failure likely due to progressive lymphangitic spread of lung cancer with possible underlying interstitial pneumonia/pneumonitis    PULMONARY -.Respiratory Failure with met. cancer and possible pneumonia/pneumonitis -high flow/ NRB/bipap as needed -continue Bronchodilator Therapy -high risk for intubation -continue IV abx(day 4) and IV steroids(day 3) -obtain sputum and blood cultures pending   CARDIOVASCULAR -continue ICU monitoring -vasopressors if neded  RENAL -follow chem 7 -follow UO -continue Foley Catheter-assess need  GASTROINTESTINAL -Nausea and vomiting -supportive care -IV pain meds as needed  HEMATOLOGIC -follow oncology recs -follow h/h  INFECTIOUS -emperic abx therapy Await cultures  ENDOCRINE - ICU hypoglycemic\Hyperglycemia protocol  Prognosis is very poor. Recommend  DNR status    I have personally obtained a history, examined the patient, evaluated laboratory and imaging results, formulated the assessment and plan and placed orders.  The Patient requires high complexity decision making for assessment and support, frequent evaluation and titration of therapies, application of advanced monitoring technologies and  extensive interpretation of multiple databases. Critical Care Time devoted to patient care services described in this note is 40 minutes.   Overall, patient is critically ill, prognosis is guarded. Patient at high risk for cardiac arrest and death.   Corrin Parker, M.D. Pulmonary & [place] Director Intensive Care Unit   06/24/2014, 9:28 AM

## 2014-06-24 NOTE — Progress Notes (Signed)
Pt alert and oriented. Vitals stable- NSR on monitor. Receiving pain and nausea medicine around the clock- Dr. Mortimer Fries and Dr. Bridgett Larsson made aware.  On 45% fi02 of high flow nasal cannula- tolerating well.

## 2014-06-24 NOTE — Progress Notes (Signed)
I asked Dr. Mortimer Fries if he would like a ABG since pt is on high flow at 69% fi02- he refused.

## 2014-06-25 LAB — CBC WITH DIFFERENTIAL/PLATELET
BASOS ABS: 0 10*3/uL (ref 0–0.1)
BASOS PCT: 1 %
EOS ABS: 0.1 10*3/uL (ref 0–0.7)
EOS PCT: 2 %
HEMATOCRIT: 30.2 % — AB (ref 35.0–47.0)
HEMOGLOBIN: 9.8 g/dL — AB (ref 12.0–16.0)
Lymphocytes Relative: 16 %
Lymphs Abs: 0.6 10*3/uL — ABNORMAL LOW (ref 1.0–3.6)
MCH: 28.3 pg (ref 26.0–34.0)
MCHC: 32.6 g/dL (ref 32.0–36.0)
MCV: 86.7 fL (ref 80.0–100.0)
MONOS PCT: 10 %
Monocytes Absolute: 0.4 10*3/uL (ref 0.2–0.9)
Neutro Abs: 2.8 10*3/uL (ref 1.4–6.5)
Neutrophils Relative %: 71 %
Platelets: 200 10*3/uL (ref 150–440)
RBC: 3.48 MIL/uL — ABNORMAL LOW (ref 3.80–5.20)
RDW: 23.5 % — ABNORMAL HIGH (ref 11.5–14.5)
WBC: 3.8 10*3/uL (ref 3.6–11.0)

## 2014-06-25 LAB — BASIC METABOLIC PANEL
Anion gap: 9 (ref 5–15)
BUN: 14 mg/dL (ref 6–20)
CO2: 31 mmol/L (ref 22–32)
Calcium: 8.2 mg/dL — ABNORMAL LOW (ref 8.9–10.3)
Chloride: 98 mmol/L — ABNORMAL LOW (ref 101–111)
Creatinine, Ser: 0.76 mg/dL (ref 0.44–1.00)
GLUCOSE: 90 mg/dL (ref 65–99)
POTASSIUM: 3.3 mmol/L — AB (ref 3.5–5.1)
SODIUM: 138 mmol/L (ref 135–145)

## 2014-06-25 LAB — MAGNESIUM: MAGNESIUM: 1.9 mg/dL (ref 1.7–2.4)

## 2014-06-25 MED ORDER — ENSURE ENLIVE PO LIQD
237.0000 mL | Freq: Two times a day (BID) | ORAL | Status: DC
  Administered 2014-06-26 – 2014-06-27 (×4): 237 mL via ORAL

## 2014-06-25 MED ORDER — SENNOSIDES-DOCUSATE SODIUM 8.6-50 MG PO TABS
1.0000 | ORAL_TABLET | Freq: Two times a day (BID) | ORAL | Status: DC
  Administered 2014-06-25 – 2014-06-28 (×7): 1 via ORAL
  Filled 2014-06-25 (×6): qty 1

## 2014-06-25 MED ORDER — METOCLOPRAMIDE HCL 5 MG/ML IJ SOLN
10.0000 mg | Freq: Once | INTRAMUSCULAR | Status: AC
  Administered 2014-06-25: 10 mg via INTRAVENOUS
  Filled 2014-06-25: qty 2

## 2014-06-25 MED ORDER — DRONABINOL 2.5 MG PO CAPS
5.0000 mg | ORAL_CAPSULE | Freq: Two times a day (BID) | ORAL | Status: DC
  Administered 2014-06-25 – 2014-06-28 (×7): 5 mg via ORAL
  Filled 2014-06-25 (×7): qty 2

## 2014-06-25 MED ORDER — MORPHINE SULFATE 4 MG/ML IJ SOLN
4.0000 mg | INTRAMUSCULAR | Status: DC | PRN
  Administered 2014-06-25 – 2014-06-28 (×18): 4 mg via INTRAVENOUS
  Filled 2014-06-25 (×18): qty 1

## 2014-06-25 MED ORDER — CETYLPYRIDINIUM CHLORIDE 0.05 % MT LIQD
7.0000 mL | Freq: Two times a day (BID) | OROMUCOSAL | Status: DC
  Administered 2014-06-25 – 2014-06-27 (×5): 7 mL via OROMUCOSAL

## 2014-06-25 MED ORDER — POTASSIUM CHLORIDE 10 MEQ/100ML IV SOLN
10.0000 meq | INTRAVENOUS | Status: AC
Start: 2014-06-25 — End: 2014-06-26
  Administered 2014-06-25 (×4): 10 meq via INTRAVENOUS
  Filled 2014-06-25 (×4): qty 100

## 2014-06-25 NOTE — Progress Notes (Signed)
Initial Nutrition Assessment    INTERVENTION: [employer] Food Supplement: Ensure Enlive (each supplement provides 350kcal and 20 grams of protein) BID between meals, made as a milkshake with icecream per pt request Meals/Snacks: cater to pt preferences, if po intake/appetite does not improve, may benefit from liberalizing diet Nutrition related medication management: pt may benefit from further intervention regarding bowel regimen if continues without BM, no BM since 6/20 Coordination of Care: weight on admission same as stated weight, unsure if this admission weight is stated or measured; RN to obtain new weight  NUTRITION DIAGNOSIS:  Inadequate oral intake related to acute illness as evidenced by meal completion < 25%, per patient/family report.   GOAL:  Patient will meet greater than or equal to 90% of their needs   MONITOR:   (Energy Intake, Anthropometrics, Pulmonary, Electrolyte/Renal Profile)  REASON FOR ASSESSMENT:  Consult Poor PO  ASSESSMENT:  Pt admitted with acute respiratory failure, currently on HFNC; pt with endstage lung cancer, possible pneumonia  PMHx:  Past Medical History  Diagnosis Date  . Pneumonia   . Lung cancer   . DVT (deep venous thrombosis)   . Urinary incontinence   . HTN (hypertension)   . Chronic diastolic congestive heart failure     Diet Order: Heart Healthy  Current Nutrition: pt drank ice water this AM, did not eat breakfast, did not want anything to eat on visit this AM. Pt reports poor appetite since admission  Food/Nutrition-Related History: Pt reports eating well prior to admission   Medications: reglan, phenergan, zofran, marinol, lasix, senokot  Electrolyte/Renal Profile and Glucose Profile:   Recent Labs Lab 06/23/14 0102 06/24/14 0120 06/25/14 1124  NA 137 137 138  K 3.7 3.9 3.3*  CL 95* 95* 98*  CO2 33* 32 31  BUN '10 15 14  '$ CREATININE 0.63 0.74 0.76  CALCIUM 7.4* 7.8* 8.2*  MG 2.1 2.0 1.9  PHOS  --  2.6  --    GLUCOSE 162* 140* 90   Protein Profile:  Recent Labs Lab 06/22/14 0123  ALBUMIN 2.5*    Gastrointestinal Profile: reports nausea at times Last BM: 6/20   Weight Change: pt reports weight has been stable; reports UBW around 150 pounds Anthropometrics: Height:  Ht Readings from Last 1 Encounters:  06/22/14 '5\' 6"'$  (1.676 m)    Weight: unsure if weight stated or measured; Sarah RN to obtain new weight  Wt Readings from Last 1 Encounters:  06/22/14 150 lb (68.04 kg)       Wt Readings from Last 10 Encounters:  06/22/14 150 lb (68.04 kg)  06/10/14 157 lb 6.5 oz (71.399 kg)  06/09/14 150 lb (68.04 kg)  06/03/14 150 lb (68.04 kg)  05/20/14 159 lb 9.8 oz (72.4 kg)  05/16/14 150 lb (68.04 kg)  05/06/14 158 lb 1.1 oz (71.7 kg)  04/03/14 160 lb 0.9 oz (72.6 kg)  01/25/13 186 lb 14.4 oz (84.777 kg)    BMI:  Body mass index is 24.22 kg/(m^2).     Intake/Output Summary (Last 24 hours) at 06/25/14 1234 Last data filed at 06/25/14 1130  Gross per 24 hour  Intake    870 ml  Output   2050 ml  Net  -1180 ml   LOW Care Level  Kerman Passey MS, RD, LDN [phone] Pager

## 2014-06-25 NOTE — Progress Notes (Signed)
Pt is a x o 3. C/o pain and nausea PRN meds given x2.S tach on Cm.   UOP 351m. .VSS. Resting comfortably in bed .

## 2014-06-25 NOTE — Progress Notes (Signed)
[facility] Hematology/Oncology Progress Note  Date of admission: 06/22/2014  Hospital day:  06/24/2014  Chief Complaint: [name] is a 54 y.o. female with metastatic adenocarcinoma who was admitted to the intensive care unit with acute respiratory failure.  Subjective: Patient feels a little better.  She states that her daughter is aware that she is hospitalized and will be arriving soon.  Social History: The patient is alone today.  Allergies: No Known Allergies  Scheduled Medications: . apixaban  5 mg Oral BID  . budesonide (PULMICORT) nebulizer solution  0.5 mg Nebulization BID  . dronabinol  2.5 mg Oral BID AC  . fentaNYL  100 mcg Transdermal Q72H  . fentaNYL  50 mcg Transdermal Q72H  . furosemide  40 mg Oral BID  . ipratropium-albuterol  3 mL Nebulization Q4H  . methylPREDNISolone (SOLU-MEDROL) injection  40 mg Intravenous Daily  . metoprolol tartrate  25 mg Oral BID  . pantoprazole  40 mg Oral Daily  . piperacillin-tazobactam (ZOSYN)  IV  3.375 g Intravenous Q8H  . sodium chloride  3 mL Intravenous Q12H  . sodium chloride      . sulfamethoxazole-trimethoprim  2 tablet Oral Q12H    Review of Systems: GENERAL: Fatigue. Weak, but a little better. No fevers or sweats. Unspecified weight loss. PERFORMANCE STATUS (ECOG): 2 HEENT: No visual changes, runny nose, sore throat, mouth sores or tenderness. Lungs: Shortness of breath.  Cough. Hemoptysis last week. Cardiac: Chest pain. No palpitations, orthopnea, or PND. GI: Poor appetite.  Nausea without vomiting. No diarrhea, constipation, melena or hematochezia. GU: No urgency, frequency, dysuria, or hematuria. Musculoskeletal: No back pain. No joint pain. No muscle tenderness. Extremities: No pain or swelling. Skin: No rashes or skin changes. Neuro: General weakness. No headache, numbness or weakness, balance or coordination issues. Endocrine: No diabetes, thyroid issues, hot  flashes or night sweats. Psych: No mood changes, depression or anxiety. Pain: No focal pain. Review of systems: All other systems reviewed and found to be negative.  Physical Exam: Blood pressure 81/56, pulse 95, temperature 97.9 F (36.6 C), temperature source Oral, resp. rate 10, height 5' 6" (1.676 m), weight 150 lb (68.04 kg), SpO2 96 %.  GENERAL: Chronically ill appearing woman lying comfortably on the medical intensive care unit in no acute distress. MENTAL STATUS: Alert and oriented to person, place and time. HEAD: Brown hair. Normocephalic, atraumatic, face symmetric. Cushingoid features. EYES: Pupils equal round and reactive to light and accomodation. No conjunctivitis or scleral icterus. ENT: Oropharynx clear without lesion. Tongue normal. Mucous membranes moist.  RESPIRATORY: Decreased respiratory excursion. Faint dry crackles at the bases.  No wheezes. CARDIOVASCULAR: Regular rate and rhythm without murmur, rub or gallop. ABDOMEN: Soft, non-tender, with active bowel sounds, and no hepatosplenomegaly. No masses. SKIN: No rashes, ulcers or lesions. EXTREMITIES: Tender bilateral lower extremity edema. No palpable cords. LYMPH NODES: No palpable cervical, supraclavicular, axillary or inguinal adenopathy  NEUROLOGICAL: Unremarkable. PSYCH: Appropriate.  Results for orders placed or performed during the hospital encounter of 06/22/14 (from the past 48 hour(s))  Culture, expectorated sputum-assessment     Status: None   Collection Time: 06/23/14  8:00 AM  Result Value Ref Range   Specimen Description SPU    Special Requests Immunocompromised    Sputum evaluation      Sputum specimen not acceptable for testing.  Please recollect.   CTJ TO JACKIE VICKERS AT [phone] 6.22.16    Report Status 06/23/2014 FINAL   Vancomycin, trough  Status: None   Collection Time: 06/24/14  1:20 AM  Result Value Ref Range   Vancomycin Tr 18 10 - 20 ug/mL  CBC with  Differential/Platelet     Status: Abnormal   Collection Time: 06/24/14  1:20 AM  Result Value Ref Range   WBC 5.9 3.6 - 11.0 K/uL   RBC 3.76 (L) 3.80 - 5.20 MIL/uL   Hemoglobin 10.3 (L) 12.0 - 16.0 g/dL   HCT 32.6 (L) 35.0 - 47.0 %   MCV 86.7 80.0 - 100.0 fL   MCH 27.4 26.0 - 34.0 pg   MCHC 31.6 (L) 32.0 - 36.0 g/dL   RDW 23.2 (H) 11.5 - 14.5 %   Platelets 200 150 - 440 K/uL   Neutrophils Relative % 90 %   Neutro Abs 5.3 1.4 - 6.5 K/uL   Lymphocytes Relative 5 %   Lymphs Abs 0.3 (L) 1.0 - 3.6 K/uL   Monocytes Relative 5 %   Monocytes Absolute 0.3 0.2 - 0.9 K/uL   Eosinophils Relative 0 %   Eosinophils Absolute 0.0 0 - 0.7 K/uL   Basophils Relative 0 %   Basophils Absolute 0.0 0 - 0.1 K/uL  Basic metabolic panel     Status: Abnormal   Collection Time: 06/24/14  1:20 AM  Result Value Ref Range   Sodium 137 135 - 145 mmol/L   Potassium 3.9 3.5 - 5.1 mmol/L   Chloride 95 (L) 101 - 111 mmol/L   CO2 32 22 - 32 mmol/L   Glucose, Bld 140 (H) 65 - 99 mg/dL   BUN 15 6 - 20 mg/dL   Creatinine, Ser 0.74 0.44 - 1.00 mg/dL   Calcium 7.8 (L) 8.9 - 10.3 mg/dL   GFR calc non Af Amer >60 >60 mL/min   GFR calc Af Amer >60 >60 mL/min    Comment: (NOTE) The eGFR has been calculated using the CKD EPI equation. This calculation has not been validated in all clinical situations. eGFR's persistently <60 mL/min signify possible Chronic Kidney Disease.    Anion gap 10 5 - 15  Magnesium     Status: None   Collection Time: 06/24/14  1:20 AM  Result Value Ref Range   Magnesium 2.0 1.7 - 2.4 mg/dL  Phosphorus     Status: None   Collection Time: 06/24/14  1:20 AM  Result Value Ref Range   Phosphorus 2.6 2.5 - 4.6 mg/dL  Glucose, capillary     Status: None   Collection Time: 06/24/14 12:58 PM  Result Value Ref Range   Glucose-Capillary 90 65 - 99 mg/dL   No results found.  Assessment:  [name] is a 54 y.o. female with stage IV lung cancer with known lymphangitic spread who is  admitted to the intensive care unit with progressive shortness of breath. Chest x-ray reveals diffuse bilateral airspace opacification. Differential includes progressive lymphnagitic spread of tumor, pneumonia or pulmonary edema.   With aggressive supportive care (antibiotics, diuretics, steroids), she feels slightly better.    Plan: 1. Hematology/Oncology:  Discussed with patient review of imaging studies with radiology.  Non specific ground glass appearance unable to classify exact etiology of respiratory deterioration.  Continue with aggressive supportive care.  Patient missed chemotherapy this week.  If out of hospital next week, will reinstitute Opdivo.  Patient's daughter aware of current clinical condition.  Appreciate palliative care consult.   Lequita Asal, MD

## 2014-06-25 NOTE — Care Management Note (Signed)
Case Management Note  Patient Details  Name: Denesia Donelan MRN: [id number] Date of Birth: 1960-12-16  Subjective/Objective:   Patient up to chair. Possible transfer to the floor.  Remains short of breath with 4L/[place]. Just weaned from 35% HFNC. Palliative has consulted and pt continues to want to remain a full code. Daughter aware that patient is at end of life. Would benefit from [facility] vs. Hospice at discharge.                 Action/Plan:   Expected Discharge Date:                  Expected Discharge Plan:     In-House Referral:     Discharge planning Services     Post Acute Care Choice:    Choice offered to:     DME Arranged:    DME Agency:     HH Arranged:    Daniel Agency:     Status of Service:     Medicare Important Message Given:    Date Medicare IM Given:    Medicare IM give by:    Date Additional Medicare IM Given:    Additional Medicare Important Message give by:     If discussed at [place] of Stay Meetings, dates discussed:    Additional Comments:  Jolly Mango, RN 06/25/2014, 1:32 PM

## 2014-06-25 NOTE — Consult Note (Signed)
Palliative Medicine Inpatient Consult Follow Up Note   Name: [name] Date: 06/25/2014 MRN: [id number]  DOB: 1960/07/03  Referring Physician: Demetrios Loll, MD  Palliative Care consult requested for this 54 y.o. female for goals of medical therapy in patient with stage IV lung cancer admitted with shortness of breath  [name] is lying in bed in CCU. Off HFNC, on 4L [place]. Family at bedside.    REVIEW OF SYSTEMS:  Pain: None Dyspnea:  Yes Nausea/Vomiting:  No Diarrhea:  No Constipation:   No Depression:   Yes Anxiety:   No Fatigue:   Yes  CODE STATUS: Full code   PAST MEDICAL HISTORY: Past Medical History  Diagnosis Date  . Pneumonia   . Lung cancer   . DVT (deep venous thrombosis)   . Urinary incontinence   . HTN (hypertension)   . Chronic diastolic congestive heart failure     PAST SURGICAL HISTORY:  Past Surgical History  Procedure Laterality Date  . Video bronchoscopy Bilateral 01/14/2013    Procedure: VIDEO BRONCHOSCOPY WITHOUT FLUORO;  Surgeon: Wilhelmina Mcardle, MD;  Location: [facility] ENDOSCOPY;  Service: Cardiopulmonary;  Laterality: Bilateral;  . Video bronchoscopy N/A 01/27/2013    Procedure: VIDEO BRONCHOSCOPY;  Surgeon: Ivin Poot, MD;  Location: [place];  Service: Thoracic;  Laterality: N/A;  . Video assisted thoracoscopy Right 01/27/2013    Procedure: VIDEO ASSISTED THORACOSCOPY;  Surgeon: Ivin Poot, MD;  Location: [place];  Service: Thoracic;  Laterality: Right;  . Lung biopsy Right 01/27/2013    Procedure: LUNG BIOPSY;  Surgeon: Ivin Poot, MD;  Location: [place];  Service: Thoracic;  Laterality: Right;  . Pericardial tap N/A 01/11/2013    Procedure: PERICARDIAL TAP;  Surgeon: Blane Ohara, MD;  Location: [facility] CATH LAB;  Service: Cardiovascular;  Laterality: N/A;  . Bladder surgery      Vital Signs: BP 102/74 mmHg  Pulse 107  Temp(Src) 97.9 F (36.6 C) (Oral)  Resp 20  Ht '5\' 6"'$  (1.676 m)  Wt 68.04 kg (150 lb)  BMI 24.22 kg/m2  SpO2  95% Filed Weights   06/22/14 0043  Weight: 68.04 kg (150 lb)    Estimated body mass index is 24.22 kg/(m^2) as calculated from the following:   Height as of this encounter: '5\' 6"'$  (1.676 m).   Weight as of this encounter: 68.04 kg (150 lb).  PHYSICAL EXAM: General: critically ill-appearing HEENT: OP clear, [place] in place Neck: Trachea midline  Cardiovascular: regular rhythm, tachycardic Pulmonary/Chest: poor air movement ant fields without audible wheeze Abdominal: Soft, nontender, hypoactive bowel sounds GU: No SP tenderness Extremities: 1+ pitting edema LLE, trace RLE Neurological: alert, oriented, grossly nonfocal Skin: no rashes Psychiatric: calm  LABS: CBC:    Component Value Date/Time   WBC 3.8 06/25/2014 0535   WBC 9.6 04/25/2014 0645   HGB 9.8* 06/25/2014 0535   HGB 10.7* 04/25/2014 0645   HCT 30.2* 06/25/2014 0535   HCT 32.6* 04/25/2014 0645   PLT 200 06/25/2014 0535   PLT 175 04/25/2014 0645   MCV 86.7 06/25/2014 0535   MCV 90 04/25/2014 0645   NEUTROABS 2.8 06/25/2014 0535   NEUTROABS 8.3* 04/20/2014 1736   LYMPHSABS 0.6* 06/25/2014 0535   LYMPHSABS 0.4* 04/20/2014 1736   MONOABS 0.4 06/25/2014 0535   MONOABS 0.4 04/20/2014 1736   EOSABS 0.1 06/25/2014 0535   EOSABS 0.0 04/20/2014 1736   BASOSABS 0.0 06/25/2014 0535   BASOSABS 0.0 04/20/2014 1736   Comprehensive Metabolic Panel:  Component Value Date/Time   NA 137 06/24/2014 0120   NA 139 04/25/2014 0645   K 3.9 06/24/2014 0120   K 3.6 04/25/2014 0645   CL 95* 06/24/2014 0120   CL 105 04/25/2014 0645   CO2 32 06/24/2014 0120   CO2 27 04/25/2014 0645   BUN 15 06/24/2014 0120   BUN 34* 04/25/2014 0645   CREATININE 0.74 06/24/2014 0120   CREATININE 0.64 04/25/2014 0645   GLUCOSE 140* 06/24/2014 0120   GLUCOSE 99 04/25/2014 0645   CALCIUM 7.8* 06/24/2014 0120   CALCIUM 8.6* 04/25/2014 0645   AST 20 06/22/2014 0123   AST 14* 04/25/2014 0645   ALT 20 06/22/2014 0123   ALT 14 04/25/2014 0645    ALKPHOS 65 06/22/2014 0123   ALKPHOS 49 04/25/2014 0645   BILITOT 1.1 06/22/2014 0123   PROT 5.3* 06/22/2014 0123   PROT 5.5* 04/25/2014 0645   ALBUMIN 2.5* 06/22/2014 0123   ALBUMIN 3.2* 04/25/2014 0645    IMPRESSION: [name] is a 54 yo woman with PMH of 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 Pt was admitted 06/22/14 with shortness of breath and hypoxia. CXR c/w progression of lymphangitic spread vs pneumonia vs pulmonary edema. This is pt's 18 th admission in the past year.  I had a lengthy meeting with pt's daughter, Milana Na, who is pt's HCPOA. Daughter is visiting from [place]. Daughter understands that pt is approaching end of life and wants her mother to be comfortable. However, daughter is going to respect pt's wishes even if she disagrees with them. Pt's goal is to live long enough to see her brother and sister. They are coming next Friday from [place] and [place] respectively.   Will continue to work with pt on goals of therapy. For now, pt is full code.     PLAN: As above  REFERRALS TO BE ORDERED:  Chaplain   More than 50% of the visit was spent in counseling/coordination of care: YES  Time spent: 40 minutes

## 2014-06-25 NOTE — Progress Notes (Signed)
[place] NOTE  Pharmacy Consult for Zosyn Dosing and Electrolyte Management   Indication: ?HCAP/ Electrolyte Monitoring   No Known Allergies  Patient Measurements: Height: '5\' 6"'$  (167.6 cm) Weight: 154 lb 12.2 oz (70.2 kg) IBW/kg (Calculated) : 59.3  Vital Signs: Temp: 98.2 F (36.8 C) (06/24 1600) Temp Source: Oral (06/24 1600) BP: 99/77 mmHg (06/24 1800) Pulse Rate: 122 (06/24 1800) Intake/Output from previous day: 06/23 0701 - 06/24 0700 In: 560 [P.O.:160; IV Piggyback:400] Out: 900 [Urine:900] Intake/Output from this shift:   Vent settings for last 24 hours: Vent Mode:  [-]  FiO2 (%):  [40 %] 40 %  Labs:  Recent Labs  06/23/14 0102 06/24/14 0120 06/25/14 0535 06/25/14 1124  WBC 3.7 5.9 3.8  --   HGB 10.2* 10.3* 9.8*  --   HCT 31.6* 32.6* 30.2*  --   PLT 174 200 200  --   CREATININE 0.63 0.74  --  0.76  MG 2.1 2.0  --  1.9  PHOS  --  2.6  --   --    Estimated Creatinine Clearance: 77.9 mL/min (by C-G formula based on Cr of 0.76).   Recent Labs  06/24/14 1258  [place]    Microbiology: Recent Results (from the past 720 hour(s))  Urine culture     Status: None   Collection Time: 06/22/14  8:34 AM  Result Value Ref Range Status   Specimen Description URINE, CLEAN CATCH  Final   Special Requests NONE  Final   Culture   Final    MULTIPLE SPECIES PRESENT, SUGGEST RECOLLECTION IF CLINICALLY INDICATED   Report Status 06/24/2014 FINAL  Final  Culture, blood (routine x 2)     Status: None (Preliminary result)   Collection Time: 06/22/14  1:04 PM  Result Value Ref Range Status   Specimen Description BLOOD  Final   Special Requests Immunocompromised  Final   Culture NO GROWTH 3 DAYS  Final   Report Status PENDING  Incomplete  Culture, blood (routine x 2)     Status: None (Preliminary result)   Collection Time: 06/22/14  1:17 PM  Result Value Ref Range Status   Specimen Description BLOOD  Final   Special Requests  Immunocompromised  Final   Culture NO GROWTH 3 DAYS  Final   Report Status PENDING  Incomplete  Culture, expectorated sputum-assessment     Status: None   Collection Time: 06/23/14  8:00 AM  Result Value Ref Range Status   Specimen Description SPU  Final   Special Requests Immunocompromised  Final   Sputum evaluation   Final    Sputum specimen not acceptable for testing.  Please recollect.   CTJ TO JACKIE VICKERS AT 3299 6.22.16    Report Status 06/23/2014 FINAL  Final    Medications:  Scheduled:  . antiseptic oral rinse  7 mL Mouth Rinse BID  . apixaban  5 mg Oral BID  . budesonide (PULMICORT) nebulizer solution  0.5 mg Nebulization BID  . dronabinol  5 mg Oral BID AC  . feeding supplement (ENSURE ENLIVE)  237 mL Oral BID BM  . fentaNYL  100 mcg Transdermal Q72H  . fentaNYL  50 mcg Transdermal Q72H  . furosemide  40 mg Oral BID  . ipratropium-albuterol  3 mL Nebulization Q4H  . methylPREDNISolone (SOLU-MEDROL) injection  40 mg Intravenous Daily  . metoprolol tartrate  25 mg Oral BID  . pantoprazole  40 mg Oral Daily  . piperacillin-tazobactam (ZOSYN)  IV  3.375 g Intravenous Q8H  . potassium chloride  10 mEq Intravenous Q1 Hr x 4  . senna-docusate  1 tablet Oral BID  . sodium chloride  3 mL Intravenous Q12H  . sulfamethoxazole-trimethoprim  2 tablet Oral Q12H   Infusions:    Assessment: 54 yo female with stage IV adenocarcinoma of the lung with mets being treated for possible HCAP. Patient is currently on day 4 of Zosyn EI and day 4 of Bactrim. Patient is also receiving methylprednisolone '40mg'$  IV daily. Patient previously received 4 days of vancomycin.    Plan:  1. Electrolytes: Patient received potassium 26mq IV x 1. Will recheck all electrolytes with am labs.    2. Zosyn: Will continue patient on Zosyn EI 3.375g IV Q8hr.    Pharmacy will continue to monitor and adjust per consult.    Tobechukwu Emmick L 06/25/2014,8:40 PM

## 2014-06-25 NOTE — Progress Notes (Signed)
[place] NOTE  Pharmacy Consult for Electrolyte management Indication:    No Known Allergies  Patient Measurements: Height: '5\' 6"'$  (167.6 cm) Weight: 154 lb 12.2 oz (70.2 kg) IBW/kg (Calculated) : 59.3 Adjusted Body Weight:   Vital Signs: Temp: 98.2 F (36.8 C) (06/24 1600) Temp Source: Oral (06/24 1600) BP: 99/77 mmHg (06/24 1800) Pulse Rate: 122 (06/24 1800) Intake/Output from previous day: 06/23 0701 - 06/24 0700 In: 560 [P.O.:160; IV Piggyback:400] Out: 900 [Urine:900] Intake/Output from this shift:   Vent settings for last 24 hours: Vent Mode:  [-]  FiO2 (%):  [40 %] 40 %  Labs:  Recent Labs  06/23/14 0102 06/24/14 0120 06/25/14 0535 06/25/14 1124  WBC 3.7 5.9 3.8  --   HGB 10.2* 10.3* 9.8*  --   HCT 31.6* 32.6* 30.2*  --   PLT 174 200 200  --   CREATININE 0.63 0.74  --  0.76  MG 2.1 2.0  --  1.9  PHOS  --  2.6  --   --    Estimated Creatinine Clearance: 77.9 mL/min (by C-G formula based on Cr of 0.76).   Recent Labs  06/24/14 1258  [place]    Microbiology: Recent Results (from the past 720 hour(s))  Urine culture     Status: None   Collection Time: 06/22/14  8:34 AM  Result Value Ref Range Status   Specimen Description URINE, CLEAN CATCH  Final   Special Requests NONE  Final   Culture   Final    MULTIPLE SPECIES PRESENT, SUGGEST RECOLLECTION IF CLINICALLY INDICATED   Report Status 06/24/2014 FINAL  Final  Culture, blood (routine x 2)     Status: None (Preliminary result)   Collection Time: 06/22/14  1:04 PM  Result Value Ref Range Status   Specimen Description BLOOD  Final   Special Requests Immunocompromised  Final   Culture NO GROWTH 3 DAYS  Final   Report Status PENDING  Incomplete  Culture, blood (routine x 2)     Status: None (Preliminary result)   Collection Time: 06/22/14  1:17 PM  Result Value Ref Range Status   Specimen Description BLOOD  Final   Special Requests Immunocompromised  Final   Culture NO  GROWTH 3 DAYS  Final   Report Status PENDING  Incomplete  Culture, expectorated sputum-assessment     Status: None   Collection Time: 06/23/14  8:00 AM  Result Value Ref Range Status   Specimen Description SPU  Final   Special Requests Immunocompromised  Final   Sputum evaluation   Final    Sputum specimen not acceptable for testing.  Please recollect.   CTJ TO JACKIE VICKERS AT 5993 6.22.16    Report Status 06/23/2014 FINAL  Final    Medications:  Scheduled:  . antiseptic oral rinse  7 mL Mouth Rinse BID  . apixaban  5 mg Oral BID  . budesonide (PULMICORT) nebulizer solution  0.5 mg Nebulization BID  . dronabinol  5 mg Oral BID AC  . feeding supplement (ENSURE ENLIVE)  237 mL Oral BID BM  . fentaNYL  100 mcg Transdermal Q72H  . fentaNYL  50 mcg Transdermal Q72H  . furosemide  40 mg Oral BID  . ipratropium-albuterol  3 mL Nebulization Q4H  . methylPREDNISolone (SOLU-MEDROL) injection  40 mg Intravenous Daily  . metoprolol tartrate  25 mg Oral BID  . pantoprazole  40 mg Oral Daily  . piperacillin-tazobactam (ZOSYN)  IV  3.375 g Intravenous  Q8H  . potassium chloride  10 mEq Intravenous Q1 Hr x 4  . senna-docusate  1 tablet Oral BID  . sodium chloride  3 mL Intravenous Q12H  . sulfamethoxazole-trimethoprim  2 tablet Oral Q12H    Assessment: 6/24 :  K+ = 3.3   Goal of Therapy:  K  3.5 - 5.3   Plan:  Will order KCl 40 mEq X 1 and recheck K with 6/25 AM labs.   Waddell Iten D 06/25/2014,7:33 PM

## 2014-06-25 NOTE — Consult Note (Signed)
[facility] Infectious Disease     Reason for Consult: Progressive hypoxia    Referring Physician: Dr Mortimer Fries Date of Admission:  06/22/2014   Principal Problem:   Acute respiratory failure with hypoxemia Active Problems:   Adenocarcinoma of lung, stage 4   Pneumonia   DVT (deep venous thrombosis)   Chronic diastolic congestive heart failure   Chest pain   Sepsis   Hypokalemia  HPI: [name] is a 54 y.o. 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 Pt was admitted 06/22/14 with shortness of breath and hypoxia. CXR c/w progression of lymphangitic spread vs pneumonia vs pulmonary edema. She was persistently hypoxic but has now improved some to 4 L.  Has been on 2 L at home.  Her cough was initially dry but now somewhat productive. Has been on vanco, zosyn and bactrim, Sputum cx was not  Acceptable but she has produced another one today.   Past Medical History  Diagnosis Date  . Pneumonia   . Lung cancer   . DVT (deep venous thrombosis)   . Urinary incontinence   . HTN (hypertension)   . Chronic diastolic congestive heart failure    Past Surgical History  Procedure Laterality Date  . Video bronchoscopy Bilateral 01/14/2013    Procedure: VIDEO BRONCHOSCOPY WITHOUT FLUORO;  Surgeon: Wilhelmina Mcardle, MD;  Location: [facility] ENDOSCOPY;  Service: Cardiopulmonary;  Laterality: Bilateral;  . Video bronchoscopy N/A 01/27/2013    Procedure: VIDEO BRONCHOSCOPY;  Surgeon: Ivin Poot, MD;  Location: [place];  Service: Thoracic;  Laterality: N/A;  . Video assisted thoracoscopy Right 01/27/2013    Procedure: VIDEO ASSISTED THORACOSCOPY;  Surgeon: Ivin Poot, MD;  Location: [place];  Service: Thoracic;  Laterality: Right;  .  Lung biopsy Right 01/27/2013    Procedure: LUNG BIOPSY;  Surgeon: Ivin Poot, MD;  Location: [place];  Service: Thoracic;  Laterality: Right;  . Pericardial tap N/A 01/11/2013    Procedure: PERICARDIAL TAP;  Surgeon: Blane Ohara, MD;  Location: [facility] CATH LAB;  Service: Cardiovascular;  Laterality: N/A;  . Bladder surgery     History  Substance Use Topics  . Smoking status: Former Research scientist (life sciences)  . Smokeless tobacco: Never Used  . Alcohol Use: Yes     Comment: occasionally has a drink   Family History  Problem Relation Age of Onset  . Coronary artery disease Mother 58  . Lung cancer Mother   . Prostate cancer Father   . Breast cancer Mother   . Heart attack Mother     Allergies: No Known Allergies  Current antibiotics: Antibiotics Given (last 72 hours)    Date/Time Action Medication Dose Rate   06/22/14 1505 Given   piperacillin-tazobactam (ZOSYN) IVPB 3.375 g 3.375 g 12.5 mL/hr   06/22/14 2126 Given   sulfamethoxazole-trimethoprim (BACTRIM,SEPTRA) 400-80 MG per tablet 2 tablet 2 tablet    06/23/14 0038 Given   piperacillin-tazobactam (ZOSYN) IVPB 3.375 g 3.375 g 12.5 mL/hr   06/23/14 0054 Given   vancomycin (VANCOCIN) IVPB 1000 mg/200 mL premix 1,000 mg 200 mL/hr   06/23/14 0759 Given   piperacillin-tazobactam (ZOSYN) IVPB 3.375 g 3.375 g 12.5 mL/hr   06/23/14 1024 Given   sulfamethoxazole-trimethoprim (BACTRIM,SEPTRA) 400-80 MG per tablet 2 tablet 2 tablet  06/23/14 1227 Given   vancomycin (VANCOCIN) IVPB 1000 mg/200 mL premix 1,000 mg 200 mL/hr   06/23/14 1506 Given   piperacillin-tazobactam (ZOSYN) IVPB 3.375 g 3.375 g 12.5 mL/hr   06/23/14 2140 Given   sulfamethoxazole-trimethoprim (BACTRIM,SEPTRA) 400-80 MG per tablet 2 tablet 2 tablet    06/23/14 2317 Given   piperacillin-tazobactam (ZOSYN) IVPB 3.375 g 3.375 g 12.5 mL/hr   06/24/14 0241 Given   vancomycin (VANCOCIN) IVPB 1000 mg/200 mL premix 1,000 mg 200 mL/hr   06/24/14 0825 Given   piperacillin-tazobactam  (ZOSYN) IVPB 3.375 g 3.375 g 12.5 mL/hr   06/24/14 1031 Given   sulfamethoxazole-trimethoprim (BACTRIM,SEPTRA) 400-80 MG per tablet 2 tablet 2 tablet    06/24/14 1413 Given   vancomycin (VANCOCIN) IVPB 1000 mg/200 mL premix 1,000 mg 200 mL/hr   06/24/14 1602 Given   piperacillin-tazobactam (ZOSYN) IVPB 3.375 g 3.375 g 12.5 mL/hr   06/24/14 2118 Given   sulfamethoxazole-trimethoprim (BACTRIM,SEPTRA) 400-80 MG per tablet 2 tablet 2 tablet    06/24/14 2248 Given   piperacillin-tazobactam (ZOSYN) IVPB 3.375 g 3.375 g 12.5 mL/hr   06/25/14 3846 Given   piperacillin-tazobactam (ZOSYN) IVPB 3.375 g 3.375 g 12.5 mL/hr   06/25/14 1000 Given   sulfamethoxazole-trimethoprim (BACTRIM,SEPTRA) 400-80 MG per tablet 2 tablet 2 tablet       MEDICATIONS: . antiseptic oral rinse  7 mL Mouth Rinse BID  . apixaban  5 mg Oral BID  . budesonide (PULMICORT) nebulizer solution  0.5 mg Nebulization BID  . dronabinol  5 mg Oral BID AC  . feeding supplement (ENSURE ENLIVE)  237 mL Oral BID BM  . fentaNYL  100 mcg Transdermal Q72H  . fentaNYL  50 mcg Transdermal Q72H  . furosemide  40 mg Oral BID  . ipratropium-albuterol  3 mL Nebulization Q4H  . methylPREDNISolone (SOLU-MEDROL) injection  40 mg Intravenous Daily  . metoprolol tartrate  25 mg Oral BID  . pantoprazole  40 mg Oral Daily  . piperacillin-tazobactam (ZOSYN)  IV  3.375 g Intravenous Q8H  . sodium chloride  3 mL Intravenous Q12H  . sulfamethoxazole-trimethoprim  2 tablet Oral Q12H    Review of Systems - 11 systems reviewed and negative per HPI   OBJECTIVE: Temp:  [97.8 F (36.6 C)-98.4 F (36.9 C)] 97.9 F (36.6 C) (06/24 1219) Pulse Rate:  [87-115] 107 (06/24 1129) Resp:  [10-20] 20 (06/24 1129) BP: (74-102)/(52-74) 102/74 mmHg (06/24 1000) SpO2:  [89 %-97 %] 95 % (06/24 1129) FiO2 (%):  [40 %] 40 % (06/24 0356) Physical Exam  Constitutional:  oriented to person, place, and time. appears well-developed and well-nourished. No distress.  On 4 L HENT: [place]/AT, PERRLA, no scleral icterus Mouth/Throat: Oropharynx is clear and moist. No oropharyngeal exudate.  Cardiovascular: Normal rate, regular rhythm and normal heart sounds. Exam reveals no gallop and no friction rub.  No murmur heard.  Pulmonary/Chest: decent air movement, Rhonchi L base  Neck - supple, no nuchal rigidity Abdominal: Soft. Bowel sounds are normal.  exhibits no distension. There is no tenderness.  Lymphadenopathy: no cervical adenopathy. No axillary adenopathy Neurological: alert and oriented to person, place, and time.  Skin: Skin is warm and dry. No rash noted. No erythema.  Psychiatric: a normal mood and affect.  behavior is normal.    LABS: Results for orders placed or performed during the hospital encounter of 06/22/14 (from the past 48 hour(s))  Vancomycin, trough     Status: None   Collection Time: 06/24/14  1:20 AM  Result  Value Ref Range   Vancomycin Tr 18 10 - 20 ug/mL  CBC with Differential/Platelet     Status: Abnormal   Collection Time: 06/24/14  1:20 AM  Result Value Ref Range   WBC 5.9 3.6 - 11.0 K/uL   RBC 3.76 (L) 3.80 - 5.20 MIL/uL   Hemoglobin 10.3 (L) 12.0 - 16.0 g/dL   HCT 32.6 (L) 35.0 - 47.0 %   MCV 86.7 80.0 - 100.0 fL   MCH 27.4 26.0 - 34.0 pg   MCHC 31.6 (L) 32.0 - 36.0 g/dL   RDW 23.2 (H) 11.5 - 14.5 %   Platelets 200 150 - 440 K/uL   Neutrophils Relative % 90 %   Neutro Abs 5.3 1.4 - 6.5 K/uL   Lymphocytes Relative 5 %   Lymphs Abs 0.3 (L) 1.0 - 3.6 K/uL   Monocytes Relative 5 %   Monocytes Absolute 0.3 0.2 - 0.9 K/uL   Eosinophils Relative 0 %   Eosinophils Absolute 0.0 0 - 0.7 K/uL   Basophils Relative 0 %   Basophils Absolute 0.0 0 - 0.1 K/uL  Basic metabolic panel     Status: Abnormal   Collection Time: 06/24/14  1:20 AM  Result Value Ref Range   Sodium 137 135 - 145 mmol/L   Potassium 3.9 3.5 - 5.1 mmol/L   Chloride 95 (L) 101 - 111 mmol/L   CO2 32 22 - 32 mmol/L   Glucose, Bld 140 (H) 65 - 99 mg/dL   BUN  15 6 - 20 mg/dL   Creatinine, Ser 0.74 0.44 - 1.00 mg/dL   Calcium 7.8 (L) 8.9 - 10.3 mg/dL   GFR calc non Af Amer >60 >60 mL/min   GFR calc Af Amer >60 >60 mL/min    Comment: (NOTE) The eGFR has been calculated using the CKD EPI equation. This calculation has not been validated in all clinical situations. eGFR's persistently <60 mL/min signify possible Chronic Kidney Disease.    Anion gap 10 5 - 15  Magnesium     Status: None   Collection Time: 06/24/14  1:20 AM  Result Value Ref Range   Magnesium 2.0 1.7 - 2.4 mg/dL  Phosphorus     Status: None   Collection Time: 06/24/14  1:20 AM  Result Value Ref Range   Phosphorus 2.6 2.5 - 4.6 mg/dL  Glucose, capillary     Status: None   Collection Time: 06/24/14 12:58 PM  Result Value Ref Range   Glucose-Capillary 90 65 - 99 mg/dL  CBC with Differential/Platelet     Status: Abnormal   Collection Time: 06/25/14  5:35 AM  Result Value Ref Range   WBC 3.8 3.6 - 11.0 K/uL   RBC 3.48 (L) 3.80 - 5.20 MIL/uL   Hemoglobin 9.8 (L) 12.0 - 16.0 g/dL   HCT 30.2 (L) 35.0 - 47.0 %   MCV 86.7 80.0 - 100.0 fL   MCH 28.3 26.0 - 34.0 pg   MCHC 32.6 32.0 - 36.0 g/dL   RDW 23.5 (H) 11.5 - 14.5 %   Platelets 200 150 - 440 K/uL   Neutrophils Relative % 71 %   Neutro Abs 2.8 1.4 - 6.5 K/uL   Lymphocytes Relative 16 %   Lymphs Abs 0.6 (L) 1.0 - 3.6 K/uL   Monocytes Relative 10 %   Monocytes Absolute 0.4 0.2 - 0.9 K/uL   Eosinophils Relative 2 %   Eosinophils Absolute 0.1 0 - 0.7 K/uL   Basophils Relative 1 %  Basophils Absolute 0.0 0 - 0.1 K/uL  Basic metabolic panel     Status: Abnormal   Collection Time: 06/25/14 11:24 AM  Result Value Ref Range   Sodium 138 135 - 145 mmol/L   Potassium 3.3 (L) 3.5 - 5.1 mmol/L   Chloride 98 (L) 101 - 111 mmol/L   CO2 31 22 - 32 mmol/L   Glucose, Bld 90 65 - 99 mg/dL   BUN 14 6 - 20 mg/dL   Creatinine, Ser 0.76 0.44 - 1.00 mg/dL   Calcium 8.2 (L) 8.9 - 10.3 mg/dL   GFR calc non Af Amer >60 >60 mL/min   GFR  calc Af Amer >60 >60 mL/min    Comment: (NOTE) The eGFR has been calculated using the CKD EPI equation. This calculation has not been validated in all clinical situations. eGFR's persistently <60 mL/min signify possible Chronic Kidney Disease.    Anion gap 9 5 - 15  Magnesium     Status: None   Collection Time: 06/25/14 11:24 AM  Result Value Ref Range   Magnesium 1.9 1.7 - 2.4 mg/dL   No components found for: ESR, C REACTIVE PROTEIN MICRO: Recent Results (from the past 720 hour(s))  Urine culture     Status: None   Collection Time: 06/22/14  8:34 AM  Result Value Ref Range Status   Specimen Description URINE, CLEAN CATCH  Final   Special Requests NONE  Final   Culture   Final    MULTIPLE SPECIES PRESENT, SUGGEST RECOLLECTION IF CLINICALLY INDICATED   Report Status 06/24/2014 FINAL  Final  Culture, blood (routine x 2)     Status: None (Preliminary result)   Collection Time: 06/22/14  1:04 PM  Result Value Ref Range Status   Specimen Description BLOOD  Final   Special Requests Immunocompromised  Final   Culture NO GROWTH < 24 HOURS  Final   Report Status PENDING  Incomplete  Culture, blood (routine x 2)     Status: None (Preliminary result)   Collection Time: 06/22/14  1:17 PM  Result Value Ref Range Status   Specimen Description BLOOD  Final   Special Requests Immunocompromised  Final   Culture NO GROWTH < 24 HOURS  Final   Report Status PENDING  Incomplete  Culture, expectorated sputum-assessment     Status: None   Collection Time: 06/23/14  8:00 AM  Result Value Ref Range Status   Specimen Description SPU  Final   Special Requests Immunocompromised  Final   Sputum evaluation   Final    Sputum specimen not acceptable for testing.  Please recollect.   CTJ TO [facility] [place] AT [id number]    Report Status 06/23/2014 FINAL  Final    IMAGING: Ct Angio Chest Pe W/cm &/or Wo Cm  06/09/2014   CLINICAL DATA:  Productive cough.  Short of breath.  EXAM: CT ANGIOGRAPHY CHEST  WITH CONTRAST  TECHNIQUE: Multidetector CT imaging of the chest was performed using the standard protocol during bolus administration of intravenous contrast. Multiplanar CT image reconstructions and MIPs were obtained to evaluate the vascular anatomy.  CONTRAST:  181m OMNIPAQUE IOHEXOL 350 MG/ML SOLN  COMPARISON:  03/06/2014  FINDINGS: There are no filling defects in the pulmonary arterial tree to suggest acute pulmonary thromboembolism.  Normal appearance of the aorta. No evidence of dissection or intramural hematoma. Great vessels are patent within the confines of the examination including the vertebral arteries bilaterally.  Right jugular Port-A-Cath device is in place. Tip is at the  cavoatrial junction.  Peribronchovascular soft tissue thickening in the hilar regions is unchanged likely related to lymphangitic tumor. Interstitial lung disease characterize by nodular interlobular septal thickening has progressed. There probably is some associated ground-glass and micro nodules. Right upper lobe scarring with calcification.  No pneumothorax.  Bilateral pleural effusions have resolved.  Sclerotic bone lesions are stable. This includes the C6 vertebral body.  Review of the MIP images confirms the above findings.  IMPRESSION: No evidence acute pulmonary thromboembolism.  Bilateral pleural effusions have resolved.  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   Electronically Signed   By: Marybelle Killings M.D.   On: 06/09/2014 13:45   Dg Chest Portable 1 View  06/22/2014   CLINICAL DATA:  Acute onset of shortness of breath and bilateral foot swelling. Generalized chest pain. Initial encounter.  EXAM: PORTABLE CHEST - 1 VIEW  COMPARISON:  Chest radiograph performed 05/16/2014, and CTA of the chest performed 06/09/2014  FINDINGS: The lungs are mildly hypoexpanded. Diffuse bilateral airspace opacification is mildly improved on the right and mildly worsened on the left. Given prior CT  images, this is concerning for lymphangitic spread of tumor, though superimposed pneumonia or pulmonary edema cannot be excluded. No definite pleural effusion or pneumothorax is seen.  The cardiomediastinal silhouette is borderline enlarged. A right-sided chest port is noted ending about the distal SVC. No acute osseous abnormalities are seen.  IMPRESSION: Lungs mildly hypoexpanded. Diffuse bilateral airspace opacification, mildly improved on the right and mildly worsened on the left. Given prior CT images, this remains concerning for lymphangitic spread of tumor, though superimposed pneumonia or pulmonary edema cannot be excluded. Borderline cardiomegaly noted.   Electronically Signed   By: Garald Balding M.D.   On: 06/22/2014 02:21    Assessment:   [name] is a 54 y.o. 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 Pt was admitted 06/22/14 with shortness of breath and hypoxia. CXR c/w progression of lymphangitic spread vs pneumonia vs pulmonary edema.  Cx initialy was rejected- another sent today I think this is mainly progression of underlying lung cancer with lymphangtic spread.   She has responded to steroids mainly I think but has also received vanco, zosyn and bactrim.  Recommendations Dc vanco Cont zosyn and bactrim pending sputum cx If continues to improve could change to oral abx in 1-2 days and dc.  I will be available over the weekend and will follow Thank you very much for allowing me to participate in the care of this patient. Please call with questions.   Cheral Marker. Ola Spurr, MD

## 2014-06-25 NOTE — Consult Note (Signed)
PULMONARY / CRITICAL CARE MEDICINE   Name: [name] MRN: [id number] DOB: [birth year]    ADMISSION DATE:  06/22/2014    CHIEF COMPLAINT:  SOB   HISTORY OF PRESENT ILLNESS   54 yo white female with end stage lung cancer admitted to ICU for acute hypoxic resp failure, patient alert and awake, but in resp distress,patient placed on ABX and IV steroids and BD therapy, patient with progressive infiltrates on CT chest  Patient still with SOB, remains hypoxic  Prognosis is very POOR, remains SOB, fio2 at 35%        PAST MEDICAL HISTORY    :  Past Medical History  Diagnosis Date  . Pneumonia   . Lung cancer   . DVT (deep venous thrombosis)   . Urinary incontinence   . HTN (hypertension)   . Chronic diastolic congestive heart failure    Past Surgical History  Procedure Laterality Date  . Video bronchoscopy Bilateral 01/14/2013    Procedure: VIDEO BRONCHOSCOPY WITHOUT FLUORO;  Surgeon: Wilhelmina Mcardle, MD;  Location: [facility] ENDOSCOPY;  Service: Cardiopulmonary;  Laterality: Bilateral;  . Video bronchoscopy N/A 01/27/2013    Procedure: VIDEO BRONCHOSCOPY;  Surgeon: Ivin Poot, MD;  Location: [place];  Service: Thoracic;  Laterality: N/A;  . Video assisted thoracoscopy Right 01/27/2013    Procedure: VIDEO ASSISTED THORACOSCOPY;  Surgeon: Ivin Poot, MD;  Location: [place];  Service: Thoracic;  Laterality: Right;  . Lung biopsy Right 01/27/2013    Procedure: LUNG BIOPSY;  Surgeon: Ivin Poot, MD;  Location: [place];  Service: Thoracic;  Laterality: Right;  . Pericardial tap N/A 01/11/2013    Procedure: PERICARDIAL TAP;  Surgeon: Blane Ohara, MD;  Location: [facility] CATH LAB;  Service: Cardiovascular;  Laterality: N/A;  . Bladder surgery     Prior to Admission medications   Medication Sig Start Date End Date Taking? Authorizing Provider  apixaban (ELIQUIS) 5 MG TABS tablet Take 1 tablet (5 mg total) by mouth 2 (two) times daily. 05/25/14  Yes Epifanio Lesches, MD   conjugated estrogens (PREMARIN) vaginal cream Place 1 Applicatorful vaginally 3 (three) times a week. Pt uses on Monday, Wednesday, and Friday.   Yes Historical Provider, MD  dronabinol (MARINOL) 5 MG capsule Take 5 mg by mouth 2 (two) times daily.    Yes Historical Provider, MD  fentaNYL (DURAGESIC - DOSED MCG/HR) 100 MCG/HR Place 1 patch (100 mcg total) onto the skin every 3 (three) days. Patient taking differently: Place 100 mcg onto the skin every 3 (three) days. Pt uses in addition to the 26mg patch. 06/10/14  Yes TLloyd Huger MD  fentaNYL (DURAGESIC - DOSED MCG/HR) 25 MCG/HR patch Place 1 patch (25 mcg total) onto the skin every 3 (three) days. Patient taking differently: Place 25 mcg onto the skin every 3 (three) days. Pt uses in addition to the 1042m patch. 06/10/14  Yes TiLloyd HugerMD  furosemide (LASIX) 40 MG tablet Take 1 tablet (40 mg total) by mouth 2 (two) times daily. 06/08/14  Yes TiLloyd HugerMD  gabapentin (NEURONTIN) 300 MG capsule Take 1 capsule (300 mg total) by mouth 3 (three) times daily. 05/20/14  Yes TiLloyd HugerMD  HYDROmorphone (DILAUDID) 4 MG tablet Take 1 tablet (4 mg total) by mouth every 4 (four) hours as needed for severe pain. 06/10/14  Yes TiLloyd HugerMD  Ipratropium-Albuterol (COMBIVENT) 20-100 MCG/ACT AERS respimat Inhale 1 puff into the lungs 4 (four) times daily as  needed. For shortness of breath and wheezing Patient taking differently: Inhale 1 puff into the lungs 4 (four) times daily as needed for wheezing or shortness of breath.  05/20/14  Yes Lloyd Huger, MD  metoCLOPramide (REGLAN) 5 MG tablet Take 5 mg by mouth 4 (four) times daily as needed for nausea.   Yes Historical Provider, MD  metoprolol tartrate (LOPRESSOR) 25 MG tablet Take 1 tablet (25 mg total) by mouth 3 (three) times daily. 05/20/14  Yes Lloyd Huger, MD  omeprazole (PRILOSEC) 20 MG capsule Take 20 mg by mouth daily.   Yes Historical Provider, MD   ondansetron (ZOFRAN) 8 MG tablet Take 8 mg by mouth every 8 (eight) hours as needed for nausea or vomiting.   Yes Historical Provider, MD  potassium chloride SA (K-DUR,KLOR-CON) 20 MEQ tablet Take 1 tablet (20 mEq total) by mouth 2 (two) times daily. 05/20/14  Yes Lloyd Huger, MD  promethazine (PHENERGAN) 25 MG tablet Take 1 tablet (25 mg total) by mouth every 6 (six) hours as needed. For nausea and vomiting. Patient taking differently: Take 25 mg by mouth every 6 (six) hours as needed for nausea or vomiting.  06/10/14  Yes Lloyd Huger, MD  scopolamine (TRANSDERM-SCOP) 1 MG/3DAYS Place 1 patch (1.5 mg total) onto the skin every 3 (three) days. 06/08/14  Yes Lloyd Huger, MD   No Known Allergies   FAMILY HISTORY   Family History  Problem Relation Age of Onset  . Coronary artery disease Mother 65  . Lung cancer Mother   . Prostate cancer Father   . Breast cancer Mother   . Heart attack Mother       SOCIAL HISTORY    reports that she has quit smoking. She has never used smokeless tobacco. She reports that she drinks alcohol. She reports that she does not use illicit drugs.  Review of Systems  Constitutional: Positive for malaise/fatigue and diaphoresis. Negative for fever, chills and weight loss.  HENT: Negative for congestion and hearing loss.   Eyes: Negative for blurred vision and double vision.  Respiratory: Positive for cough, shortness of breath and wheezing.   Cardiovascular: Negative for chest pain, palpitations and orthopnea.  Gastrointestinal: Positive for nausea and vomiting. Negative for heartburn, abdominal pain, diarrhea, constipation and blood in stool.  Genitourinary: Negative for dysuria and urgency.  Musculoskeletal: Negative for myalgias, back pain and neck pain.  Skin: Negative for rash.  Neurological: Negative for dizziness, tingling, tremors, weakness and headaches.  Endo/Heme/Allergies: Does not bruise/bleed easily.  Psychiatric/Behavioral:  Negative for depression, suicidal ideas and substance abuse.  All other systems reviewed and are negative.     VITAL SIGNS    Temp:  [97.7 F (36.5 C)-98.4 F (36.9 C)] 97.9 F (36.6 C) (06/24 0745) Pulse Rate:  [87-115] 105 (06/24 0805) Resp:  [10-20] 10 (06/24 0805) BP: (74-93)/(49-67) 92/61 mmHg (06/24 0800) SpO2:  [89 %-100 %] 92 % (06/24 0805) FiO2 (%):  [40 %-45.5 %] 40 % (06/24 0356) HEMODYNAMICS:   VENTILATOR SETTINGS: Vent Mode:  [-]  FiO2 (%):  [40 %-45.5 %] 40 % INTAKE / OUTPUT:  Intake/Output Summary (Last 24 hours) at 06/25/14 0946 Last data filed at 06/25/14 3491  Gross per 24 hour  Intake    870 ml  Output    900 ml  Net    -30 ml       PHYSICAL EXAM   Physical Exam  Constitutional: She is oriented to person, place, and time. She  appears distressed.  HENT:  Head: Normocephalic and atraumatic.  Mouth/Throat: Oropharynx is clear and moist.  Eyes: Conjunctivae and EOM are normal. Pupils are equal, round, and reactive to light.  Neck: Normal range of motion. No tracheal deviation present.  Cardiovascular: Normal rate and regular rhythm.   No murmur heard. Pulmonary/Chest: She is in respiratory distress. She has rales.  Abdominal: Soft. Bowel sounds are normal.  Musculoskeletal: She exhibits no edema.  Neurological: She is alert and oriented to person, place, and time.  Skin: Skin is warm. No rash noted. She is diaphoretic.       LABS   LABS:  CBC  Recent Labs Lab 06/23/14 0102 06/24/14 0120 06/25/14 0535  WBC 3.7 5.9 3.8  HGB 10.2* 10.3* 9.8*  HCT 31.6* 32.6* 30.2*  PLT 174 200 200   Coag's No results for input(s): APTT, INR in the last 168 hours. BMET  Recent Labs Lab 06/22/14 0908 06/23/14 0102 06/24/14 0120  NA 134* 137 137  K 2.8* 3.7 3.9  CL 94* 95* 95*  CO2 33* 33* 32  BUN _0 CREATININE 0.66 0.63 0.74  GLUCOSE 117* 162* 140*   Electrolytes  Recent Labs Lab 06/22/14 0908 06/23/14 0102 06/24/14 0120   CALCIUM 7.3* 7.4* 7.8*  MG 1.5* 2.1 2.0  PHOS  --   --  2.6   Sepsis Markers  Recent Labs Lab 06/22/14 0510  LATICACIDVEN 0.9   ABG No results for input(s): PHART, PCO2ART, PO2ART in the last 168 hours. Liver Enzymes  Recent Labs Lab 06/22/14 0123  AST 20  ALT 20  ALKPHOS 65  BILITOT 1.1  ALBUMIN 2.5*   Cardiac Enzymes  Recent Labs Lab 06/22/14 0908 06/22/14 1527 06/23/14 0102  TROPONINI <0.03 <0.03 0.08*   Glucose  Recent Labs Lab 06/24/14 1258  GLUCAP 90     Recent Results (from the past 240 hour(s))  Urine culture     Status: None   Collection Time: 06/22/14  8:34 AM  Result Value Ref Range Status   Specimen Description URINE, CLEAN CATCH  Final   Special Requests NONE  Final   Culture   Final    MULTIPLE SPECIES PRESENT, SUGGEST RECOLLECTION IF CLINICALLY INDICATED   Report Status 06/24/2014 FINAL  Final  Culture, blood (routine x 2)     Status: None (Preliminary result)   Collection Time: 06/22/14  1:04 PM  Result Value Ref Range Status   Specimen Description BLOOD  Final   Special Requests Immunocompromised  Final   Culture NO GROWTH < 24 HOURS  Final   Report Status PENDING  Incomplete  Culture, blood (routine x 2)     Status: None (Preliminary result)   Collection Time: 06/22/14  1:17 PM  Result Value Ref Range Status   Specimen Description BLOOD  Final   Special Requests Immunocompromised  Final   Culture NO GROWTH < 24 HOURS  Final   Report Status PENDING  Incomplete  Culture, expectorated sputum-assessment     Status: None   Collection Time: 06/23/14  8:00 AM  Result Value Ref Range Status   Specimen Description SPU  Final   Special Requests Immunocompromised  Final   Sputum evaluation   Final    Sputum specimen not acceptable for testing.  Please recollect.   CTJ TO JACKIE VICKERS AT [id number] 6.22.16    Report Status 06/23/2014 FINAL  Final     Current facility-administered medications:  .  acetaminophen (TYLENOL) tablet 650 mg,  650  mg, Oral, Q6H PRN, Demetrios Loll, MD, 650 mg at 06/22/14 0818 .  antiseptic oral rinse (CPC / CETYLPYRIDINIUM CHLORIDE 0.05%) solution 7 mL, 7 mL, Mouth Rinse, BID, Demetrios Loll, MD .  apixaban [facility]) tablet 5 mg, 5 mg, Oral, BID, Lance Coon, MD, 5 mg at 06/24/14 2118 .  budesonide (PULMICORT) nebulizer solution 0.5 mg, 0.5 mg, Nebulization, BID, Flora Lipps, MD, 0.5 mg at 06/25/14 0719 .  dronabinol (MARINOL) capsule 2.5 mg, 2.5 mg, Oral, BID AC, Flora Lipps, MD .  fentaNYL ([place] - dosed mcg/hr) 100 mcg, 100 mcg, Transdermal, Q72H, Lance Coon, MD, 100 mcg at 06/25/14 0603 .  fentaNYL (DURAGESIC - dosed mcg/hr) 50 mcg, 50 mcg, Transdermal, Q72H, Flora Lipps, MD, 50 mcg at 06/24/14 1603 .  furosemide (LASIX) tablet 40 mg, 40 mg, Oral, BID, Lance Coon, MD, 40 mg at 06/25/14 5329 .  HYDROmorphone (DILAUDID) injection 2 mg, 2 mg, Intravenous, Q4H PRN, Flora Lipps, MD, 2 mg at 06/25/14 0738 .  ipratropium-albuterol (DUONEB) 0.5-2.5 (3) MG/3ML nebulizer solution 3 mL, 3 mL, Nebulization, Q4H, Flora Lipps, MD, 3 mL at 06/25/14 0719 .  methylPREDNISolone sodium succinate (SOLU-MEDROL) 40 mg/mL injection 40 mg, 40 mg, Intravenous, Daily, Flora Lipps, MD .  metoprolol tartrate (LOPRESSOR) tablet 25 mg, 25 mg, Oral, BID, Lance Coon, MD, 25 mg at 06/24/14 2118 .  morphine 4 MG/ML injection 4 mg, 4 mg, Intravenous, Q4H PRN, Demetrios Loll, MD .  ondansetron [facility]) injection 4 mg, 4 mg, Intravenous, Q6H PRN, Theodoro Grist, MD, 4 mg at 06/25/14 0821 .  ondansetron (ZOFRAN) tablet 8 mg, 8 mg, Oral, Q8H PRN, Lance Coon, MD, 8 mg at 06/22/14 0949 .  oxyCODONE (Oxy IR/ROXICODONE) immediate release tablet 5 mg, 5 mg, Oral, Q4H PRN, Demetrios Loll, MD, 5 mg at 06/25/14 9242 .  pantoprazole (PROTONIX) EC tablet 40 mg, 40 mg, Oral, Daily, Lance Coon, MD, 40 mg at 06/24/14 1031 .  piperacillin-tazobactam (ZOSYN) IVPB 3.375 g, 3.375 g, Intravenous, Q8H, Lance Coon, MD, 3.375 g at 06/25/14 6834 .  promethazine  (PHENERGAN) injection 12.5-25 mg, 12.5-25 mg, Intravenous, Q4H PRN, Demetrios Loll, MD, 25 mg at 06/25/14 0739 .  senna-docusate (Senokot-S) tablet 1 tablet, 1 tablet, Oral, QHS PRN, Lance Coon, MD .  sodium chloride 0.9 % injection 3 mL, 3 mL, Intravenous, Q12H, Lance Coon, MD, 3 mL at 06/24/14 2246 .  sulfamethoxazole-trimethoprim (BACTRIM,SEPTRA) 400-80 MG per tablet 2 tablet, 2 tablet, Oral, Q12H, Flora Lipps, MD, 2 tablet at 06/24/14 2118  Facility-Administered Medications Ordered in Other Encounters:  .  sodium chloride 0.9 % injection 10 mL, 10 mL, Intracatheter, PRN, Lloyd Huger, MD, 10 mL at 05/06/14 1435  IMAGING    No results found.                      ASSESSMENT/PLAN   54 yo white female with end stage Lung cancer admitted to ICU for progressive hypoxic resp failure likely due to progressive lymphangitic spread of lung cancer with possible underlying interstitial pneumonia/pneumonitis    PULMONARY -.Respiratory Failure with met. cancer and possible pneumonia/pneumonitis -high flow/ NRB/bipap as needed -continue Bronchodilator Therapy -continue IV abx(day 5) and IV steroids(day 4) -sputum and blood cultures pending   CARDIOVASCULAR -continue ICU monitoring -vasopressors if neded  RENAL -follow chem 7 -follow UO -continue Foley Catheter-assess need  GASTROINTESTINAL -Nausea and vomiting -supportive care -IV pain meds as needed  HEMATOLOGIC -follow oncology recs -follow h/h  INFECTIOUS -emperic abx therapy Await cultures  ENDOCRINE -  ICU hypoglycemic\Hyperglycemia protocol  Prognosis is very poor. Recommend DNR status    I have personally obtained a history, examined the patient, evaluated laboratory and imaging results, formulated the assessment and plan and placed orders.  The Patient requires high complexity decision making for assessment and support, frequent evaluation and titration of therapies, application of advanced  monitoring technologies and extensive interpretation of multiple databases. Critical Care Time devoted to patient care services described in this note is 40 minutes.   Overall, patient is critically ill, prognosis is guarded. Patient at high risk for cardiac arrest and death.   Corrin Parker, M.D. Pulmonary & [place] Director Intensive Care Unit   06/25/2014, 9:46 AM

## 2014-06-25 NOTE — Consult Note (Signed)
I spoke with pt's daughter, [name]. She has spoken with her mother and her mother now wants to be a DNR. She has also spoken to her mother about possible transfer to the [place] and pt will consider this. Pt's goal is to see her brother and sister who are trying to get to [place] from out of state. Family are making every effort to get here over the next several days.

## 2014-06-25 NOTE — Progress Notes (Addendum)
[facility] [place] NAME: [name]    MR#:  [id number]  DATE OF BIRTH:  October 15, 1960  SUBJECTIVE:  CHIEF COMPLAINT:   Chief Complaint  Patient presents with  . Shortness of Breath   Feels better but is still nausea, weakness, cough and shortness of breath. Right-sided chest pain will coughing.  On O2 HF 45%.  REVIEW OF SYSTEMS:  CONSTITUTIONAL: No fever, has generalized  weakness.  EYES: No blurred or double vision.  EARS, NOSE, AND THROAT: No tinnitus or ear pain.  RESPIRATORY: Positive  cough and shortness of breath, no  wheezing or hemoptysis.  CARDIOVASCULAR: Right-sided chest pain will coughing, no orthopnea, edema.  GASTROINTESTINAL: No nausea, vomiting, diarrhea or abdominal pain.  GENITOURINARY: No dysuria, hematuria.  ENDOCRINE: No polyuria, nocturia,  HEMATOLOGY: No anemia, easy bruising or bleeding SKIN: No rash or lesion. MUSCULOSKELETAL: No joint pain or arthritis.   NEUROLOGIC: No tingling, numbness, weakness.  PSYCHIATRY: No anxiety or depression.   DRUG ALLERGIES:   No Known Allergies  VITALS:  Blood pressure 102/74, pulse 107, temperature 97.9 F (36.6 C), temperature source Oral, resp. rate 20, height '5\' 6"'$  (1.676 m), weight 68.04 kg (150 lb), SpO2 95 %.  PHYSICAL EXAMINATION:  GENERAL:  54 y.o.-year-old patient lying in the bed with no acute distress.  EYES: Pupils equal, round, reactive to light and accommodation. No scleral icterus. Extraocular muscles intact.  HEENT: Head atraumatic, normocephalic. Oropharynx and nasopharynx clear.  NECK:  Supple, no jugular venous distention. No thyroid enlargement, no tenderness.  LUNGS: Normal breath sounds bilaterally, no wheezing, right side is clear but has mild crackles on left side. No use of accessory muscles of respiration.  CARDIOVASCULAR: S1, S2 normal. No murmurs, rubs, or gallops.  ABDOMEN: Soft, nontender, nondistended. Bowel sounds present. No  organomegaly or mass.  EXTREMITIES: no edema, no cyanosis, or clubbing.  NEUROLOGIC: Cranial nerves II through XII are intact. Muscle strength 4/5 in all extremities. Sensation intact. Gait not checked.  PSYCHIATRIC: The patient is alert and oriented x 3.  SKIN: No obvious rash, lesion, or ulcer.    LABORATORY PANEL:   CBC  Recent Labs Lab 06/25/14 0535  WBC 3.8  HGB 9.8*  HCT 30.2*  PLT 200   ------------------------------------------------------------------------------------------------------------------  Chemistries   Recent Labs Lab 06/22/14 0123  06/25/14 1124  NA 136  < > 138  K 2.5*  < > 3.3*  CL 95*  < > 98*  CO2 30  < > 31  GLUCOSE 108*  < > 90  BUN 13  < > 14  CREATININE 0.70  < > 0.76  CALCIUM 7.6*  < > 8.2*  MG  --   < > 1.9  AST 20  --   --   ALT 20  --   --   ALKPHOS 65  --   --   BILITOT 1.1  --   --   < > = values in this interval not displayed. ------------------------------------------------------------------------------------------------------------------  Cardiac Enzymes  Recent Labs Lab 06/23/14 0102  TROPONINI 0.08*   ------------------------------------------------------------------------------------------------------------------  RADIOLOGY:  No results found.  EKG:   Orders placed or performed during the hospital encounter of 06/22/14  . EKG 12-Lead  . EKG 12-Lead    ASSESSMENT AND PLAN:   Acute respiratory failure with hypoxemia. Try to wean off HF O2,  Cont Solu-Medrol and  nebulizer treatment. Follow-up Dr. Mortimer Fries.  Sepsis with Pneumonia  Continue Zosyn and discontinue vancomycin, start  bactrim and follow-up sputum culture per Dr. Ola Spurr. Continue  nebulizer treatment.  Elevated troponin. Due to demand ischemia. Continue eliquis.  Adenocarcinoma of lung, stage 4. Dr. Ermalinda Memos had a lengthy meeting with pt's daughter, Milana Na, who is pt's HCPOA.. Now the patient is in DNR status.  Hypokalemia -improved  after repleting potassium.  Hypomagnesemia.improved after giving supplement.   DVT (deep venous thrombosis) - continue on full dose anticoagulation with Eliquis.  Chronic diastolic congestive heart failure. continue home dose by mouth Lasix.     All the records are reviewed and case discussed with Care Management/Social Workerr. Management plans discussed with the patient, the patient's sister and they are in agreement.    CODE STATUS: DO NOT RESUSCITATE  TOTAL CRITICAL TIME TAKING CARE OF THIS PATIENT: 42 minutes.   POSSIBLE D/C IN 3-4 DAYS, DEPENDING ON CLINICAL CONDITION.   Demetrios Loll M.D on 06/25/2014 at 1:59 PM  Between 7am to 6pm - Pager - [phone]  After 6pm go to [email or web address] - password [email or web address] [facility]  Office  [phone]  CC: Primary care physician; Pcp Not In System

## 2014-06-26 LAB — BASIC METABOLIC PANEL
Anion gap: 8 (ref 5–15)
BUN: 13 mg/dL (ref 6–20)
CHLORIDE: 99 mmol/L — AB (ref 101–111)
CO2: 31 mmol/L (ref 22–32)
Calcium: 8.6 mg/dL — ABNORMAL LOW (ref 8.9–10.3)
Creatinine, Ser: 0.73 mg/dL (ref 0.44–1.00)
GFR calc Af Amer: >60 mL/min (ref >60)
GFR calc non Af Amer: >60 mL/min (ref >60)
GLUCOSE: 90 mg/dL (ref 65–99)
POTASSIUM: 3.5 mmol/L (ref 3.5–5.1)
Sodium: 138 mmol/L (ref 135–145)

## 2014-06-26 LAB — CBC WITH DIFFERENTIAL/PLATELET
BASOS ABS: 0.1 10*3/uL (ref 0–0.1)
Basophils Relative: 1 %
EOS ABS: 0 10*3/uL (ref 0–0.7)
Eosinophils Relative: 1 %
HEMATOCRIT: 32.2 % — AB (ref 35.0–47.0)
Hemoglobin: 10.6 g/dL — ABNORMAL LOW (ref 12.0–16.0)
LYMPHS ABS: 0.8 10*3/uL — AB (ref 1.0–3.6)
Lymphocytes Relative: 14 %
MCH: 28 pg (ref 26.0–34.0)
MCHC: 33.1 g/dL (ref 32.0–36.0)
MCV: 84.7 fL (ref 80.0–100.0)
MONO ABS: 0.5 10*3/uL (ref 0.2–0.9)
Monocytes Relative: 8 %
Neutro Abs: 4.2 10*3/uL (ref 1.4–6.5)
Neutrophils Relative %: 76 %
PLATELETS: 237 10*3/uL (ref 150–440)
RBC: 3.8 MIL/uL (ref 3.80–5.20)
RDW: 22.3 % — ABNORMAL HIGH (ref 11.5–14.5)
WBC: 5.6 10*3/uL (ref 3.6–11.0)

## 2014-06-26 LAB — MAGNESIUM: Magnesium: 1.7 mg/dL (ref 1.7–2.4)

## 2014-06-26 LAB — PHOSPHORUS: Phosphorus: 4.2 mg/dL (ref 2.5–4.6)

## 2014-06-26 MED ORDER — MAGNESIUM SULFATE 2 GM/50ML IV SOLN
2.0000 g | Freq: Once | INTRAVENOUS | Status: AC
  Administered 2014-06-26: 2 g via INTRAVENOUS
  Filled 2014-06-26: qty 50

## 2014-06-26 NOTE — Progress Notes (Signed)
[facility] Hematology/Oncology Progress Note  Date of admission: 06/22/2014  Hospital day:  06/25/2014  Chief Complaint: [name] is a 54 y.o. female with metastatic adenocarcinoma who was admitted to the intensive care unit with acute respiratory failure.  Subjective:  The patient feels better today.  She is off high flow and on nasal cannula.  She is looking forward to going to being transferred to a regular bed and additional family members arriving.  Social History: The patient is alone today.  Earlier today, her daughter, [name], from [place] was here.  She has medical power of attorney.  She has returned back to [place].  Several other family members are on their way in tomorrow.  Allergies: No Known Allergies  Scheduled Medications: . antiseptic oral rinse  7 mL Mouth Rinse BID  . apixaban  5 mg Oral BID  . budesonide (PULMICORT) nebulizer solution  0.5 mg Nebulization BID  . dronabinol  5 mg Oral BID AC  . feeding supplement (ENSURE ENLIVE)  237 mL Oral BID BM  . fentaNYL  100 mcg Transdermal Q72H  . fentaNYL  50 mcg Transdermal Q72H  . furosemide  40 mg Oral BID  . ipratropium-albuterol  3 mL Nebulization Q4H  . methylPREDNISolone (SOLU-MEDROL) injection  40 mg Intravenous Daily  . metoprolol tartrate  25 mg Oral BID  . pantoprazole  40 mg Oral Daily  . piperacillin-tazobactam (ZOSYN)  IV  3.375 g Intravenous Q8H  . senna-docusate  1 tablet Oral BID  . sodium chloride  3 mL Intravenous Q12H  . sulfamethoxazole-trimethoprim  2 tablet Oral Q12H    Review of Systems: GENERAL: Weak, but a little better. Outlook improved.  No fevers or sweats. Unspecified weight loss. PERFORMANCE STATUS (ECOG): 2 HEENT: No visual changes, runny nose, sore throat, mouth sores or tenderness. Lungs: Shortness of breath (improved). Cough. Hemoptysis last week. Cardiac: No chest pain. No palpitations, orthopnea, or PND. GI: Poor appetite. Nausea  without vomiting. No diarrhea, constipation, melena or hematochezia. GU: No urgency, frequency, dysuria, or hematuria. Musculoskeletal: No back pain. No joint pain. No muscle tenderness. Extremities: No pain or swelling. Skin: No rashes or skin changes. Neuro: General weakness. No headache, numbness or weakness, balance or coordination issues. Endocrine: No diabetes, thyroid issues, hot flashes or night sweats. Psych: Mood and affect improved.   Pain: Pain controlled. Review of systems: All other systems reviewed and found to be negative.  Physical Exam: Blood pressure 90/61, pulse 109, temperature 97.7 F (36.5 C), temperature source Oral, resp. rate 20, height 5' 6"  (1.676 m), weight 160 lb 11.2 oz (72.893 kg), SpO2 94 %.  GENERAL: Chronically ill appearing woman lying comfortably on the medical intensive care unit in no acute distress. MENTAL STATUS: Alert and oriented to person, place and time. HEAD: Brown hair. Normocephalic, atraumatic, face symmetric. Cushingoid features. EYES: Pupils equal round and reactive to light and accomodation. No conjunctivitis or scleral icterus. ENT: Nasal cannula in place.  Oropharynx clear without lesion. Tongue normal. Mucous membranes moist.  RESPIRATORY: Improved respiratory excursion. Faint dry crackles at the bases. No wheezes. CARDIOVASCULAR: Regular rate and rhythm without murmur, rub or gallop. ABDOMEN: Soft, non-tender, with active bowel sounds, and no hepatosplenomegaly. No masses. SKIN: No rashes, ulcers or lesions. EXTREMITIES: Tender bilateral lower extremity edema. No palpable cords. NEUROLOGICAL: Unremarkable. PSYCH: Appropriate.  Results for orders placed or performed during the hospital encounter of 06/22/14 (from the past 48 hour(s))  Glucose, capillary     Status: None  Collection Time: 06/24/14 12:58 PM  Result Value Ref Range   Glucose-Capillary 90 65 - 99 mg/dL  CBC with Differential/Platelet      Status: Abnormal   Collection Time: 06/25/14  5:35 AM  Result Value Ref Range   WBC 3.8 3.6 - 11.0 K/uL   RBC 3.48 (L) 3.80 - 5.20 MIL/uL   Hemoglobin 9.8 (L) 12.0 - 16.0 g/dL   HCT 30.2 (L) 35.0 - 47.0 %   MCV 86.7 80.0 - 100.0 fL   MCH 28.3 26.0 - 34.0 pg   MCHC 32.6 32.0 - 36.0 g/dL   RDW 23.5 (H) 11.5 - 14.5 %   Platelets 200 150 - 440 K/uL   Neutrophils Relative % 71 %   Neutro Abs 2.8 1.4 - 6.5 K/uL   Lymphocytes Relative 16 %   Lymphs Abs 0.6 (L) 1.0 - 3.6 K/uL   Monocytes Relative 10 %   Monocytes Absolute 0.4 0.2 - 0.9 K/uL   Eosinophils Relative 2 %   Eosinophils Absolute 0.1 0 - 0.7 K/uL   Basophils Relative 1 %   Basophils Absolute 0.0 0 - 0.1 K/uL  Basic metabolic panel     Status: Abnormal   Collection Time: 06/25/14 11:24 AM  Result Value Ref Range   Sodium 138 135 - 145 mmol/L   Potassium 3.3 (L) 3.5 - 5.1 mmol/L   Chloride 98 (L) 101 - 111 mmol/L   CO2 31 22 - 32 mmol/L   Glucose, Bld 90 65 - 99 mg/dL   BUN 14 6 - 20 mg/dL   Creatinine, Ser 0.76 0.44 - 1.00 mg/dL   Calcium 8.2 (L) 8.9 - 10.3 mg/dL   GFR calc non Af Amer >60 >60 mL/min   GFR calc Af Amer >60 >60 mL/min    Comment: (NOTE) The eGFR has been calculated using the CKD EPI equation. This calculation has not been validated in all clinical situations. eGFR's persistently <60 mL/min signify possible Chronic Kidney Disease.    Anion gap 9 5 - 15  Magnesium     Status: None   Collection Time: 06/25/14 11:24 AM  Result Value Ref Range   Magnesium 1.9 1.7 - 2.4 mg/dL  CBC with Differential/Platelet     Status: Abnormal   Collection Time: 06/26/14  4:53 AM  Result Value Ref Range   WBC 5.6 3.6 - 11.0 K/uL   RBC 3.80 3.80 - 5.20 MIL/uL   Hemoglobin 10.6 (L) 12.0 - 16.0 g/dL   HCT 32.2 (L) 35.0 - 47.0 %   MCV 84.7 80.0 - 100.0 fL   MCH 28.0 26.0 - 34.0 pg   MCHC 33.1 32.0 - 36.0 g/dL   RDW 22.3 (H) 11.5 - 14.5 %   Platelets 237 150 - 440 K/uL   Neutrophils Relative % 76 %   Neutro Abs 4.2  1.4 - 6.5 K/uL   Lymphocytes Relative 14 %   Lymphs Abs 0.8 (L) 1.0 - 3.6 K/uL   Monocytes Relative 8 %   Monocytes Absolute 0.5 0.2 - 0.9 K/uL   Eosinophils Relative 1 %   Eosinophils Absolute 0.0 0 - 0.7 K/uL   Basophils Relative 1 %   Basophils Absolute 0.1 0 - 0.1 K/uL  Basic metabolic panel     Status: Abnormal   Collection Time: 06/26/14  4:53 AM  Result Value Ref Range   Sodium 138 135 - 145 mmol/L   Potassium 3.5 3.5 - 5.1 mmol/L   Chloride 99 (L) 101 - 111 mmol/L  CO2 31 22 - 32 mmol/L   Glucose, Bld 90 65 - 99 mg/dL   BUN 13 6 - 20 mg/dL   Creatinine, Ser 0.73 0.44 - 1.00 mg/dL   Calcium 8.6 (L) 8.9 - 10.3 mg/dL   GFR calc non Af Amer >60 >60 mL/min   GFR calc Af Amer >60 >60 mL/min    Comment: (NOTE) The eGFR has been calculated using the CKD EPI equation. This calculation has not been validated in all clinical situations. eGFR's persistently <60 mL/min signify possible Chronic Kidney Disease.    Anion gap 8 5 - 15  Magnesium     Status: None   Collection Time: 06/26/14  4:53 AM  Result Value Ref Range   Magnesium 1.7 1.7 - 2.4 mg/dL  Phosphorus     Status: None   Collection Time: 06/26/14  4:53 AM  Result Value Ref Range   Phosphorus 4.2 2.5 - 4.6 mg/dL   No results found.  Assessment:  Valta Dillon is a 54 y.o. female with stage IV lung cancer with known lymphangitic spread who is admitted to the intensive care unit with progressive shortness of breath. Chest x-ray reveals diffuse bilateral airspace opacification. Differential includes progressive lymphnagitic spread of tumor, pneumonia or pulmonary edema.   With aggressive supportive care (antibiotics, diuretics, steroids), she is clinically improving.  Plan: 1. Hematology/Oncology: Patient aware of tenuous clinical status.  She has had a long discussion with her daughter.  She has agreed to a DNR code status.  She remains hopeful about potential discharge from the hospital next week.  She is  looking forward to her family's arrival this weekend.  Anticipate patient's further discussions about treatment with Dr. Grayland Ormond on his return next week. 2. Infectious disease:  Antibiotics adjusted per infectious disease.  Sputum cultures pending.  Anticipate possible switch to oral antibiotics in a couple of days. 3. Pulmonary:  Continued slow improvement.  Possible transfer out of ICU soon.   Lequita Asal, MD

## 2014-06-26 NOTE — Progress Notes (Signed)
[name] [facility] [place] NAME: [name]    MR#:  [id number]  DATE OF BIRTH:  Mar 14, 1960  SUBJECTIVE:  CHIEF COMPLAINT:   Chief Complaint  Patient presents with  . Shortness of Breath   Feels much better, mil cough and shortness of breath. On O2 [place] 4L.  REVIEW OF SYSTEMS:  CONSTITUTIONAL: No fever, has generalized  weakness.  EYES: No blurred or double vision.  EARS, NOSE, AND THROAT: No tinnitus or ear pain.  RESPIRATORY: Positive  cough and shortness of breath, no  wheezing or hemoptysis.  CARDIOVASCULAR: Right-sided chest pain will coughing, no orthopnea, edema.  GASTROINTESTINAL: No nausea, vomiting, diarrhea or abdominal pain.  GENITOURINARY: No dysuria, hematuria.  ENDOCRINE: No polyuria, nocturia,  HEMATOLOGY: No anemia, easy bruising or bleeding SKIN: No rash or lesion. MUSCULOSKELETAL: No joint pain or arthritis.   NEUROLOGIC: No tingling, numbness, weakness.  PSYCHIATRY: No anxiety or depression.   DRUG ALLERGIES:   No Known Allergies  VITALS:  Blood pressure 106/57, pulse 134, temperature 97.5 F (36.4 C), temperature source Oral, resp. rate 20, height '5\' 6"'$  (1.676 m), weight 72.893 kg (160 lb 11.2 oz), SpO2 92 %.  PHYSICAL EXAMINATION:  GENERAL:  54 y.o.-year-old patient lying in the bed with no acute distress.  EYES: Pupils equal, round, reactive to light and accommodation. No scleral icterus. Extraocular muscles intact.  HEENT: Head atraumatic, normocephalic. Oropharynx and nasopharynx clear.  NECK:  Supple, no jugular venous distention. No thyroid enlargement, no tenderness.  LUNGS: Normal breath sounds bilaterally, no wheezing, right side is clear but has mild crackles on left side. No use of accessory muscles of respiration.  CARDIOVASCULAR: S1, S2 normal. No murmurs, rubs, or gallops.  ABDOMEN: Soft, nontender, nondistended. Bowel sounds present. No organomegaly or mass.  EXTREMITIES: no edema, no  cyanosis, or clubbing.  NEUROLOGIC: Cranial nerves II through XII are intact. Muscle strength 4/5 in all extremities. Sensation intact. Gait not checked.  PSYCHIATRIC: The patient is alert and oriented x 3.  SKIN: No obvious rash, lesion, or ulcer.    LABORATORY PANEL:   CBC  Recent Labs Lab 06/26/14 0453  WBC 5.6  HGB 10.6*  HCT 32.2*  PLT 237   ------------------------------------------------------------------------------------------------------------------  Chemistries   Recent Labs Lab 06/22/14 0123  06/26/14 0453  NA 136  < > 138  K 2.5*  < > 3.5  CL 95*  < > 99*  CO2 30  < > 31  GLUCOSE 108*  < > 90  BUN 13  < > 13  CREATININE 0.70  < > 0.73  CALCIUM 7.6*  < > 8.6*  MG  --   < > 1.7  AST 20  --   --   ALT 20  --   --   ALKPHOS 65  --   --   BILITOT 1.1  --   --   < > = values in this interval not displayed. ------------------------------------------------------------------------------------------------------------------  Cardiac Enzymes  Recent Labs Lab 06/23/14 0102  TROPONINI 0.08*   ------------------------------------------------------------------------------------------------------------------  RADIOLOGY:  No results found.  EKG:   Orders placed or performed during the hospital encounter of 06/22/14  . EKG 12-Lead  . EKG 12-Lead    ASSESSMENT AND PLAN:   Acute respiratory failure with hypoxemia. off HF O2,  On O2 [place] 4 L, Cont Solu-Medrol and  nebulizer treatment.  Sepsis with Pneumonia  Continue Zosyn and bactrim and follow-up sputum culture per Dr. Ola Spurr. Continue  nebulizer  treatment.  Elevated troponin. Due to demand ischemia. Continue eliquis.  Adenocarcinoma of lung, stage 4. Follow-up with oncologist as outpatient.  Hypokalemia -improved after repleting potassium.  Hypomagnesemia.improved after giving supplement.   DVT (deep venous thrombosis) - continue on full dose anticoagulation with Eliquis.  Chronic diastolic  congestive heart failure. continue home dose by mouth Lasix.       All the records are reviewed and case discussed with Care Management/Social Workerr. Management plans discussed with the patient, the patient's sister and they are in agreement.    CODE STATUS: DO NOT RESUSCITATE  TOTALTIME TAKING CARE OF THIS PATIENT: 37 minutes.   POSSIBLE D/C IN 2-3 DAYS, DEPENDING ON CLINICAL CONDITION.   Demetrios Loll M.D on 06/26/2014 at 12:04 PM  Between 7am to 6pm - Pager - [phone]  After 6pm go to [email or web address] - password [email or web address] [facility]  Office  [phone]  CC: Primary care physician; Pcp Not In System

## 2014-06-27 LAB — CBC WITH DIFFERENTIAL/PLATELET
BASOS ABS: 0 10*3/uL (ref 0–0.1)
BASOS PCT: 1 %
EOS PCT: 0 %
Eosinophils Absolute: 0 10*3/uL (ref 0–0.7)
HEMATOCRIT: 30.5 % — AB (ref 35.0–47.0)
HEMOGLOBIN: 9.9 g/dL — AB (ref 12.0–16.0)
LYMPHS ABS: 0.6 10*3/uL — AB (ref 1.0–3.6)
Lymphocytes Relative: 12 %
MCH: 27.8 pg (ref 26.0–34.0)
MCHC: 32.3 g/dL (ref 32.0–36.0)
MCV: 86.2 fL (ref 80.0–100.0)
Monocytes Absolute: 0.4 10*3/uL (ref 0.2–0.9)
Monocytes Relative: 8 %
Neutro Abs: 4 10*3/uL (ref 1.4–6.5)
Neutrophils Relative %: 79 %
Platelets: 265 10*3/uL (ref 150–440)
RBC: 3.54 MIL/uL — AB (ref 3.80–5.20)
RDW: 22.8 % — AB (ref 11.5–14.5)
WBC: 5.1 10*3/uL (ref 3.6–11.0)

## 2014-06-27 LAB — BASIC METABOLIC PANEL
Anion gap: 7 (ref 5–15)
BUN: 17 mg/dL (ref 6–20)
CHLORIDE: 101 mmol/L (ref 101–111)
CO2: 31 mmol/L (ref 22–32)
Calcium: 8.6 mg/dL — ABNORMAL LOW (ref 8.9–10.3)
Creatinine, Ser: 0.75 mg/dL (ref 0.44–1.00)
GFR calc non Af Amer: >60 mL/min (ref >60)
GLUCOSE: 95 mg/dL (ref 65–99)
Potassium: 3.8 mmol/L (ref 3.5–5.1)
SODIUM: 139 mmol/L (ref 135–145)

## 2014-06-27 LAB — CULTURE, BLOOD (ROUTINE X 2)
Culture: NO GROWTH
Culture: NO GROWTH

## 2014-06-27 LAB — MAGNESIUM: MAGNESIUM: 2 mg/dL (ref 1.7–2.4)

## 2014-06-27 MED ORDER — FENTANYL 50 MCG/HR TD PT72
50.0000 ug | MEDICATED_PATCH | TRANSDERMAL | Status: DC
  Administered 2014-06-27: 50 ug via TRANSDERMAL
  Filled 2014-06-27: qty 1

## 2014-06-27 MED ORDER — PREDNISONE 20 MG PO TABS
20.0000 mg | ORAL_TABLET | Freq: Every day | ORAL | Status: DC
  Administered 2014-06-28: 20 mg via ORAL
  Filled 2014-06-27: qty 1

## 2014-06-27 NOTE — Progress Notes (Signed)
Blood collected through port line for Labs. This morning per hospital protocol. Pt tolerated well.

## 2014-06-27 NOTE — Progress Notes (Signed)
Pt had quiet day. Pain well controlled with multi meds. Remains a/o. vss tele nsr. Family at bedside interacting convivially. Plan for possible discharge tomorrow

## 2014-06-27 NOTE — Progress Notes (Signed)
[place] at [place] NAME: [name]    MR#:  [id number]  DATE OF BIRTH:  1960-11-20  SUBJECTIVE:  CHIEF COMPLAINT:   Chief Complaint  Patient presents with  . Shortness of Breath   mild cough and shortness of breath. On O2 [place] 3 L.  REVIEW OF SYSTEMS:  CONSTITUTIONAL: No fever, has generalized  weakness.  EYES: No blurred or double vision.  EARS, NOSE, AND THROAT: No tinnitus or ear pain.  RESPIRATORY: Positive  cough and shortness of breath, no  wheezing or hemoptysis.  CARDIOVASCULAR: Right-sided chest pain will coughing, no orthopnea, edema.  GASTROINTESTINAL:  nausea, but no vomiting, diarrhea or abdominal pain.  GENITOURINARY: No dysuria, hematuria.  ENDOCRINE: No polyuria, nocturia,  HEMATOLOGY: No anemia, easy bruising or bleeding SKIN: No rash or lesion. MUSCULOSKELETAL: No joint pain or arthritis.   NEUROLOGIC: No tingling, numbness, weakness.  PSYCHIATRY: No anxiety or depression.   DRUG ALLERGIES:   No Known Allergies  VITALS:  Blood pressure 101/89, pulse 102, temperature 97.9 F (36.6 C), temperature source Oral, resp. rate 18, height '5\' 6"'$  (1.676 m), weight 71.351 kg (157 lb 4.8 oz), SpO2 93 %.  PHYSICAL EXAMINATION:  GENERAL:  54 y.o.-year-old patient lying in the bed with no acute distress.  EYES: Pupils equal, round, reactive to light and accommodation. No scleral icterus. Extraocular muscles intact.  HEENT: Head atraumatic, normocephalic. Oropharynx and nasopharynx clear.  NECK:  Supple, no jugular venous distention. No thyroid enlargement, no tenderness.  LUNGS: Normal breath sounds bilaterally, no wheezing, right side is clear but has mild crackles on left side. No use of accessory muscles of respiration.  CARDIOVASCULAR: S1, S2 normal. No murmurs, rubs, or gallops.  ABDOMEN: Soft, nontender, nondistended. Bowel sounds present. No organomegaly or mass.  EXTREMITIES: no edema, no cyanosis, or  clubbing.  NEUROLOGIC: Cranial nerves II through XII are intact. Muscle strength 4/5 in all extremities. Sensation intact. Gait not checked.  PSYCHIATRIC: The patient is alert and oriented x 3.  SKIN: No obvious rash, lesion, or ulcer.    LABORATORY PANEL:   CBC  Recent Labs Lab 06/27/14 0530  WBC 5.1  HGB 9.9*  HCT 30.5*  PLT 265   ------------------------------------------------------------------------------------------------------------------  Chemistries   Recent Labs Lab 06/22/14 0123  06/27/14 0530  NA 136  < > 139  K 2.5*  < > 3.8  CL 95*  < > 101  CO2 30  < > 31  GLUCOSE 108*  < > 95  BUN 13  < > 17  CREATININE 0.70  < > 0.75  CALCIUM 7.6*  < > 8.6*  MG  --   < > 2.0  AST 20  --   --   ALT 20  --   --   ALKPHOS 65  --   --   BILITOT 1.1  --   --   < > = values in this interval not displayed. ------------------------------------------------------------------------------------------------------------------  Cardiac Enzymes  Recent Labs Lab 06/23/14 0102  TROPONINI 0.08*   ------------------------------------------------------------------------------------------------------------------  RADIOLOGY:  No results found.  EKG:   Orders placed or performed during the hospital encounter of 06/22/14  . EKG 12-Lead  . EKG 12-Lead    ASSESSMENT AND PLAN:   Acute respiratory failure with hypoxemia.  On O2 [place] 3 L, discontinue Solu-Medrol, change to po prednisone, continue nebulizer treatment.  Sepsis with Pneumonia  Continue Zosyn and bactrim.  Elevated troponin. Due to demand ischemia. Continue eliquis.  Adenocarcinoma of lung, stage 4. Follow-up with oncologist as outpatient.  Hypokalemia -improved after repleting potassium.  Hypomagnesemia.improved after giving supplement.   DVT (deep venous thrombosis) - continue on full dose anticoagulation with Eliquis.  Chronic diastolic congestive heart failure. continue home dose by mouth Lasix.        All the records are reviewed and case discussed with Care Management/Social Workerr. Management plans discussed with the patient, the patient's sister and brother,  and they are in agreement.    CODE STATUS: DO NOT RESUSCITATE  TOTALTIME TAKING CARE OF THIS PATIENT: 42 minutes.   POSSIBLE D/C IN 2-3 DAYS, DEPENDING ON CLINICAL CONDITION.   Demetrios Loll M.D on 06/27/2014 at 11:59 AM  Between 7am to 6pm - Pager - [phone]  After 6pm go to [email or web address] - password [email or web address] [facility]  Office  [phone]  CC: Primary care physician; Pcp Not In System

## 2014-06-28 LAB — CBC WITH DIFFERENTIAL/PLATELET
Basophils Absolute: 0 10*3/uL (ref 0–0.1)
Basophils Relative: 1 %
Eosinophils Absolute: 0.1 10*3/uL (ref 0–0.7)
Eosinophils Relative: 2 %
HCT: 32.1 % — ABNORMAL LOW (ref 35.0–47.0)
Hemoglobin: 10.3 g/dL — ABNORMAL LOW (ref 12.0–16.0)
LYMPHS PCT: 16 %
Lymphs Abs: 0.9 10*3/uL — ABNORMAL LOW (ref 1.0–3.6)
MCH: 27.5 pg (ref 26.0–34.0)
MCHC: 32 g/dL (ref 32.0–36.0)
MCV: 85.9 fL (ref 80.0–100.0)
MONO ABS: 0.6 10*3/uL (ref 0.2–0.9)
Monocytes Relative: 10 %
NEUTROS ABS: 4.1 10*3/uL (ref 1.4–6.5)
Neutrophils Relative %: 71 %
PLATELETS: 313 10*3/uL (ref 150–440)
RBC: 3.73 MIL/uL — ABNORMAL LOW (ref 3.80–5.20)
RDW: 22.2 % — ABNORMAL HIGH (ref 11.5–14.5)
WBC: 5.8 10*3/uL (ref 3.6–11.0)

## 2014-06-28 MED ORDER — FENTANYL 50 MCG/HR TD PT72: 50.0000 ug | MEDICATED_PATCH | TRANSDERMAL | Status: DC

## 2014-06-28 MED ORDER — SULFAMETHOXAZOLE-TRIMETHOPRIM 400-80 MG PO TABS: 2.0000 | ORAL_TABLET | Freq: Two times a day (BID) | ORAL | Status: DC

## 2014-06-28 MED ORDER — HEPARIN SOD (PORK) LOCK FLUSH 100 UNIT/ML IV SOLN
500.0000 [IU] | INTRAVENOUS | Status: AC
  Administered 2014-06-28: 500 [IU] via INTRAVENOUS
  Filled 2014-06-28: qty 5

## 2014-06-28 MED ORDER — METOPROLOL TARTRATE 25 MG PO TABS: 12.5000 mg | ORAL_TABLET | Freq: Two times a day (BID) | ORAL | Status: AC

## 2014-06-28 MED ORDER — PREDNISONE 5 MG PO TABS: 10.0000 mg | ORAL_TABLET | Freq: Every day | ORAL | Status: AC

## 2014-06-28 MED ORDER — FENTANYL 100 MCG/HR TD PT72: 100.0000 ug | MEDICATED_PATCH | TRANSDERMAL | Status: DC

## 2014-06-28 NOTE — Discharge Summary (Signed)
[facility] [place] NAME: Allyce Bochicchio    MR#:  [id number]  DATE OF BIRTH:  [birth year]  DATE OF ADMISSION:  06/22/2014 ADMITTING PHYSICIAN: Lance Coon, MD  DATE OF DISCHARGE: 06/28/2014  PRIMARY CARE PHYSICIAN: Pcp Not In System    ADMISSION DIAGNOSIS:  Shortness of breath [R06.02] Hypokalemia [E87.6] Acute pulmonary edema [J81.0] Hypoxia [R09.02]   DISCHARGE DIAGNOSIS:  Acute respiratory failure with hypoxemia. Sepsis with Pneumonia  Elevated troponin.  SECONDARY DIAGNOSIS:   Past Medical History  Diagnosis Date  . Pneumonia   . Lung cancer   . DVT (deep venous thrombosis)   . Urinary incontinence   . HTN (hypertension)   . Chronic diastolic congestive heart failure     HOSPITAL COURSE:   Acute respiratory failure with hypoxemia. After admission, the patient was treated Solu-Medrol, which was changed to po prednisone. In addition, she has been treated with continue nebulizer. Initially she was on BiPAP, then changed to high flow oxygen, now she ison O2 [place] 3 L. her symptoms has much improved.  Sepsis with Pneumonia  She was treated with vancomycin, Zosyn and bactrim. According to Dr. Ola Spurr, vancomycin was discontinued. She needs to continue Bactrim after discharge.  Elevated troponin. Possibly due to demand ischemia. She has been treated with eliquis.  Adenocarcinoma of lung, stage 4. Dr. Ermalinda Memos discussed with the patient and patient's family member, who decided on DO NOT RESUSCITATE status. She will be discharged to home with hospice care. She needs follow-up with oncologist as outpatient.  Hypokalemia and Hypomagnesemia improved after repleting.   History of DVT. She is being on full dose anticoagulation with Eliquis.  Chronic diastolic congestive heart failure. Stable. She has been treated with Lasix.   DISCHARGE CONDITIONS:   Stable.  CONSULTS OBTAINED:  Treatment Team:  Lequita Asal,  MD Adrian Prows, MD Flora Lipps, MD  DRUG ALLERGIES:  No Known Allergies  DISCHARGE MEDICATIONS:   Current Discharge Medication List    START taking these medications   Details  fentaNYL (DURAGESIC - DOSED MCG/HR) 50 MCG/HR Place 1 patch (50 mcg total) onto the skin every 3 (three) days. Qty: 5 patch, Refills: 0    predniSONE (DELTASONE) 5 MG tablet Take 2 tablets (10 mg total) by mouth daily with breakfast. 10 mg po daily for 2 days, 5 mg po daily for 2 days. Qty: 6 tablet, Refills: 0    sulfamethoxazole-trimethoprim (BACTRIM,SEPTRA) 400-80 MG per tablet Take 2 tablets by mouth every 12 (twelve) hours. Qty: 10 tablet, Refills: 0      CONTINUE these medications which have CHANGED   Details  fentaNYL (DURAGESIC - DOSED MCG/HR) 100 MCG/HR Place 1 patch (100 mcg total) onto the skin every 3 (three) days. Qty: 5 patch, Refills: 0    metoprolol tartrate (LOPRESSOR) 25 MG tablet Take 0.5 tablets (12.5 mg total) by mouth 2 (two) times daily. Qty: 30 tablet, Refills: 0      CONTINUE these medications which have NOT CHANGED   Details  apixaban (ELIQUIS) 5 MG TABS tablet Take 1 tablet (5 mg total) by mouth 2 (two) times daily. Qty: 60 tablet, Refills: 0    conjugated estrogens (PREMARIN) vaginal cream Place 1 Applicatorful vaginally 3 (three) times a week. Pt uses on Monday, Wednesday, and Friday.    dronabinol (MARINOL) 5 MG capsule Take 5 mg by mouth 2 (two) times daily.     furosemide (LASIX) 40 MG tablet Take 1 tablet (40 mg total)  by mouth 2 (two) times daily. Qty: 180 tablet, Refills: 0    gabapentin (NEURONTIN) 300 MG capsule Take 1 capsule (300 mg total) by mouth 3 (three) times daily. Qty: 90 capsule, Refills: 1    HYDROmorphone (DILAUDID) 4 MG tablet Take 1 tablet (4 mg total) by mouth every 4 (four) hours as needed for severe pain. Qty: 90 tablet, Refills: 0    Ipratropium-Albuterol (COMBIVENT) 20-100 MCG/ACT AERS respimat Inhale 1 puff into the lungs 4 (four)  times daily as needed. For shortness of breath and wheezing Qty: 1 Inhaler, Refills: 1    metoCLOPramide (REGLAN) 5 MG tablet Take 5 mg by mouth 4 (four) times daily as needed for nausea.    omeprazole (PRILOSEC) 20 MG capsule Take 20 mg by mouth daily.    ondansetron (ZOFRAN) 8 MG tablet Take 8 mg by mouth every 8 (eight) hours as needed for nausea or vomiting.    potassium chloride SA (K-DUR,KLOR-CON) 20 MEQ tablet Take 1 tablet (20 mEq total) by mouth 2 (two) times daily. Qty: 60 tablet, Refills: 1    promethazine (PHENERGAN) 25 MG tablet Take 1 tablet (25 mg total) by mouth every 6 (six) hours as needed. For nausea and vomiting. Qty: 45 tablet, Refills: 2    scopolamine (TRANSDERM-SCOP) 1 MG/3DAYS Place 1 patch (1.5 mg total) onto the skin every 3 (three) days. Qty: 30 patch, Refills: 0      STOP taking these medications     fentaNYL (DURAGESIC - DOSED MCG/HR) 25 MCG/HR patch      senna-docusate (SENOKOT-S) 8.6-50 MG per tablet          DISCHARGE INSTRUCTIONS:     If you experience worsening of your admission symptoms, develop shortness of breath, life threatening emergency, suicidal or homicidal thoughts you must seek medical attention immediately by calling 911 or calling your MD immediately  if symptoms less severe.  You Must read complete instructions/literature along with all the possible adverse reactions/side effects for all the Medicines you take and that have been prescribed to you. Take any new Medicines after you have completely understood and accept all the possible adverse reactions/side effects.   Please note  You were cared for by a hospitalist during your hospital stay. If you have any questions about your discharge medications or the care you received while you were in the hospital after you are discharged, you can call the unit and asked to speak with the hospitalist on call if the hospitalist that took care of you is not available. Once you are  discharged, your primary care physician will handle any further medical issues. Please note that NO REFILLS for any discharge medications will be authorized once you are discharged, as it is imperative that you return to your primary care physician (or establish a relationship with a primary care physician if you do not have one) for your aftercare needs so that they can reassess your need for medications and monitor your lab values.    Today   SUBJECTIVE     No complaint. On oxygen by nasal cannular 3 L.  VITAL SIGNS:  Blood pressure 101/70, pulse 107, temperature 98 F (36.7 C), temperature source Oral, resp. rate 20, height '5\' 6"'$  (1.676 m), weight 70.988 kg (156 lb 8 oz), SpO2 100 %.  I/O:   Intake/Output Summary (Last 24 hours) at 06/28/14 1210 Last data filed at 06/28/14 0818  Gross per 24 hour  Intake    723 ml  Output   2350  ml  Net  -1627 ml    PHYSICAL EXAMINATION:  GENERAL:  54 y.o.-year-old patient lying in the bed with no acute distress.  EYES: Pupils equal, round, reactive to light and accommodation. No scleral icterus. Extraocular muscles intact.  HEENT: Head atraumatic, normocephalic. Oropharynx and nasopharynx clear.  NECK:  Supple, no jugular venous distention. No thyroid enlargement, no tenderness.  LUNGS: Normal breath sounds bilaterally, no wheezing, mild crackles on the left side. No use of accessory muscles of respiration.  CARDIOVASCULAR: S1, S2 normal. No murmurs, rubs, or gallops.  ABDOMEN: Soft, non-tender, non-distended. Bowel sounds present. No organomegaly or mass.  EXTREMITIES: No pedal edema, cyanosis, or clubbing.  NEUROLOGIC: Cranial nerves II through XII are intact. Muscle strength 4/5 in all extremities. Sensation intact. Gait not checked.  PSYCHIATRIC: The patient is alert and oriented x 3.  SKIN: No obvious rash, lesion, or ulcer.   DATA REVIEW:   CBC  Recent Labs Lab 06/28/14 0512  WBC 5.8  HGB 10.3*  HCT 32.1*  PLT 313     Chemistries   Recent Labs Lab 06/22/14 0123  06/27/14 0530  NA 136  < > 139  K 2.5*  < > 3.8  CL 95*  < > 101  CO2 30  < > 31  GLUCOSE 108*  < > 95  BUN 13  < > 17  CREATININE 0.70  < > 0.75  CALCIUM 7.6*  < > 8.6*  MG  --   < > 2.0  AST 20  --   --   ALT 20  --   --   ALKPHOS 65  --   --   BILITOT 1.1  --   --   < > = values in this interval not displayed.  Cardiac Enzymes  Recent Labs Lab 06/23/14 0102  TROPONINI 0.08*    Microbiology Results  Results for orders placed or performed during the hospital encounter of 06/22/14  Urine culture     Status: None   Collection Time: 06/22/14  8:34 AM  Result Value Ref Range Status   Specimen Description URINE, CLEAN CATCH  Final   Special Requests NONE  Final   Culture   Final    MULTIPLE SPECIES PRESENT, SUGGEST RECOLLECTION IF CLINICALLY INDICATED   Report Status 06/24/2014 FINAL  Final  Culture, blood (routine x 2)     Status: None   Collection Time: 06/22/14  1:04 PM  Result Value Ref Range Status   Specimen Description BLOOD  Final   Special Requests Immunocompromised  Final   Culture NO GROWTH 5 DAYS  Final   Report Status 06/27/2014 FINAL  Final  Culture, blood (routine x 2)     Status: None   Collection Time: 06/22/14  1:17 PM  Result Value Ref Range Status   Specimen Description BLOOD  Final   Special Requests Immunocompromised  Final   Culture NO GROWTH 5 DAYS  Final   Report Status 06/27/2014 FINAL  Final  Culture, expectorated sputum-assessment     Status: None   Collection Time: 06/23/14  8:00 AM  Result Value Ref Range Status   Specimen Description SPU  Final   Special Requests Immunocompromised  Final   Sputum evaluation   Final    Sputum specimen not acceptable for testing.  Please recollect.   CTJ TO [facility] AT 9528 6.22.16    Report Status 06/23/2014 FINAL  Final    RADIOLOGY:  No results found.      Discharge  plans discussed with the patient, family and they are in  agreement. Discussed with the Dr. Ermalinda Memos, hospice staff, RN and case manager.  CODE STATUS:     Code Status Orders        Start     Ordered   06/25/14 1233  Do not attempt resuscitation (DNR)   Continuous    Question Answer Comment  In the event of cardiac or respiratory ARREST Do not call a "code blue"   In the event of cardiac or respiratory ARREST Do not perform Intubation, CPR, defibrillation or ACLS   In the event of cardiac or respiratory ARREST Use medication by any route, position, wound care, and other measures to relive pain and suffering. May use oxygen, suction and manual treatment of airway obstruction as needed for comfort.      06/25/14 1232    Advance Directive Documentation        Most Recent Value   Type of Advance Directive  Healthcare Power of Attorney, Living will   Pre-existing out of facility DNR order (yellow form or pink MOST form)     "MOST" Form in Place?        TOTAL TIME TAKING CARE OF THIS PATIENT: 42 minutes.    Demetrios Loll M.D on 06/28/2014 at 12:10 PM  Between 7am to 6pm - Pager - [phone]  After 6pm go to [email or web address] - password [email or web address] [facility]  Office  [phone]  CC: Primary care physician; Pcp Not In System

## 2014-06-28 NOTE — Care Management (Signed)
Patient for discharge home today with home hospice through  [facility].  Referral was  made to nurse liaison.

## 2014-06-28 NOTE — Progress Notes (Signed)
Pt discharged to home.  Education done on new medication.  Pt voiced understanding.  Tele removed and Port deaccessed.  No other questions or concerns at this time.  Lynnda Shields, RN

## 2014-06-28 NOTE — Consult Note (Signed)
Palliative Medicine Inpatient Consult Follow Up Note   Name: [name] Date: 06/28/2014 MRN: [id number]  DOB: 06-19-1960  Referring Physician: Demetrios Loll, MD  Palliative Care consult requested for this 54 y.o. female for goals of medical therapy in patient with stage IV lung cancer admitted with shortness of breath  [name] is sitting up in bed. On 5L [place] 02. Denies dyspnea at rest. Family at bedside.   REVIEW OF SYSTEMS:  Pain: Moderate Dyspnea:  Yes Nausea/Vomiting:  Yes Diarrhea:  No Constipation:   No Depression:   No Anxiety:   No Fatigue:   Yes  CODE STATUS: DNR   PAST MEDICAL HISTORY: Past Medical History  Diagnosis Date  . Pneumonia   . Lung cancer   . DVT (deep venous thrombosis)   . Urinary incontinence   . HTN (hypertension)   . Chronic diastolic congestive heart failure     PAST SURGICAL HISTORY:  Past Surgical History  Procedure Laterality Date  . Video bronchoscopy Bilateral 01/14/2013    Procedure: VIDEO BRONCHOSCOPY WITHOUT FLUORO;  Surgeon: Wilhelmina Mcardle, MD;  Location: [facility] ENDOSCOPY;  Service: Cardiopulmonary;  Laterality: Bilateral;  . Video bronchoscopy N/A 01/27/2013    Procedure: VIDEO BRONCHOSCOPY;  Surgeon: Ivin Poot, MD;  Location: [place];  Service: Thoracic;  Laterality: N/A;  . Video assisted thoracoscopy Right 01/27/2013    Procedure: VIDEO ASSISTED THORACOSCOPY;  Surgeon: Ivin Poot, MD;  Location: [place];  Service: Thoracic;  Laterality: Right;  . Lung biopsy Right 01/27/2013    Procedure: LUNG BIOPSY;  Surgeon: Ivin Poot, MD;  Location: [place];  Service: Thoracic;  Laterality: Right;  . Pericardial tap N/A 01/11/2013    Procedure: PERICARDIAL TAP;  Surgeon: Blane Ohara, MD;  Location: [facility] CATH LAB;  Service: Cardiovascular;  Laterality: N/A;  . Bladder surgery      Vital Signs: BP 98/65 mmHg  Pulse 96  Temp(Src) 97.5 F (36.4 C) (Oral)  Resp 18  Ht '5\' 6"'$  (1.676 m)  Wt 70.988 kg (156 lb 8 oz)  BMI 25.27  kg/m2  SpO2 95% Filed Weights   06/26/14 0513 06/27/14 0500 06/28/14 0454  Weight: 72.893 kg (160 lb 11.2 oz) 71.351 kg (157 lb 4.8 oz) 70.988 kg (156 lb 8 oz)    Estimated body mass index is 25.27 kg/(m^2) as calculated from the following:   Height as of this encounter: '5\' 6"'$  (1.676 m).   Weight as of this encounter: 70.988 kg (156 lb 8 oz).  PHYSICAL EXAM: General: critically ill-appearing HEENT: OP clear, [place] in place Neck: Trachea midline  Cardiovascular: regular rhythm Pulmonary/Chest: poor air movement ant fields, no audible wheeze Abdominal: Soft, nontender, hypoactive bowel sounds GU: No SP tenderness Extremities: 1+ pitting edema LLE, trace RLE Neurological: alert, oriented, grossly nonfocal Skin: no rashes Psychiatric: calm  LABS: CBC:    Component Value Date/Time   WBC 5.8 06/28/2014 0512   WBC 9.6 04/25/2014 0645   HGB 10.3* 06/28/2014 0512   HGB 10.7* 04/25/2014 0645   HCT 32.1* 06/28/2014 0512   HCT 32.6* 04/25/2014 0645   PLT 313 06/28/2014 0512   PLT 175 04/25/2014 0645   MCV 85.9 06/28/2014 0512   MCV 90 04/25/2014 0645   NEUTROABS 4.1 06/28/2014 0512   NEUTROABS 8.3* 04/20/2014 1736   LYMPHSABS 0.9* 06/28/2014 0512   LYMPHSABS 0.4* 04/20/2014 1736   MONOABS 0.6 06/28/2014 0512   MONOABS 0.4 04/20/2014 1736   EOSABS 0.1 06/28/2014 0512   EOSABS  0.0 04/20/2014 1736   BASOSABS 0.0 06/28/2014 0512   BASOSABS 0.0 04/20/2014 1736   Comprehensive Metabolic Panel:    Component Value Date/Time   NA 139 06/27/2014 0530   NA 139 04/25/2014 0645   K 3.8 06/27/2014 0530   K 3.6 04/25/2014 0645   CL 101 06/27/2014 0530   CL 105 04/25/2014 0645   CO2 31 06/27/2014 0530   CO2 27 04/25/2014 0645   BUN 17 06/27/2014 0530   BUN 34* 04/25/2014 0645   CREATININE 0.75 06/27/2014 0530   CREATININE 0.64 04/25/2014 0645   GLUCOSE 95 06/27/2014 0530   GLUCOSE 99 04/25/2014 0645   CALCIUM 8.6* 06/27/2014 0530   CALCIUM 8.6* 04/25/2014 0645   AST 20 06/22/2014  0123   AST 14* 04/25/2014 0645   ALT 20 06/22/2014 0123   ALT 14 04/25/2014 0645   ALKPHOS 65 06/22/2014 0123   ALKPHOS 49 04/25/2014 0645   BILITOT 1.1 06/22/2014 0123   PROT 5.3* 06/22/2014 0123   PROT 5.5* 04/25/2014 0645   ALBUMIN 2.5* 06/22/2014 0123   ALBUMIN 3.2* 04/25/2014 0645    IMPRESSION: [name] is a 54 yo woman with PMH of 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 Pt was admitted 06/22/14 with shortness of breath and hypoxia. CXR c/w progression of lymphangitic spread vs pneumonia vs pulmonary edema. This is pt's 18 th admission in the past year.  Pt appears stable on 5L [place] 02. Unsure if she wants to go home with hospice vs Hospice Home. Waiting for daughter to arrive from [place] to discuss. Discussed with family and they want to wait on daughter to arrive as well so that all of family can discuss with pt.   Will follow up.   PLAN: As above  REFERRALS TO BE ORDERED:  Social work CM   More than 50% of the visit was spent in counseling/coordination of care: YES  Time spent: 35 minutes

## 2014-06-28 NOTE — Progress Notes (Signed)
New referral for [facility] at home after discharge. 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 Pt was admitted 06/22/14 with shortness of breath and hypoxia. CXR concerning for progression of lymphangitic spread vs pneumonia vs pulmonary edema. This is pt's 5 th admission in the past 6 months. Patient and her family met with Dr. Ermalinda Memos and requested that liaison speak with them about services at home and at the hospice home. Writer met with patient, her sisters Vaughan Basta and Blain Pais, her son Quita Skye and daughter [name] and brother Clair Gulling in the room. Education initiated regarding hospice services, team approach and philosophy of care with good understanding voiced by all.  Services at the hospice home were also discussed, an over view of end of life care, respite and symptom management given. [place] and her famiy chose at this time for her to go home with hospice services.  She currently has in the home: oxygen with [facility], rolling walker with a seat, shower bench and a over the toilet seat. No DME needs/requests prior to discharge. Current plan is for patient to go home today via [facility]. [name], who is visiting from [place] is to be the contact for hospice admission [phone]).  Patient has a right chest port a cath to be flushed prior to discharge. Fentanyl 177mg patch in place, changed this morning 6/27, fentanyl patch 540m changed yesterday 6/26. Patient currently used dilaudid 4 mg po Q 4 hrs for break through. She is currently on oxygen at 3 liters nasal cannula. Staff RN IrCorky SingAttending physician Dr. ChBridgett Larssonnd CM Nann all made aware of discharge plan.  Prescriptions and portable DNR in place. Thank you KaFlo ShanksN, BSN, CHLa Verniaf AlTazewellzHMontanaNebraskaiaison [phone]

## 2014-06-28 NOTE — Progress Notes (Signed)
Pain controlled w/ PRN meds. Rested quietly between care. Right chest porta-cath patent. Plan poss d/c today. Will cont to monitor.

## 2014-06-28 NOTE — Evaluation (Signed)
Physical Therapy Evaluation Patient Details Name: [name] MRN: [id number] DOB: 10/30/1960 Today's Date: 06/28/2014   History of Present Illness  Mattilynn Forrer is a 54 y.o. female who presents with progressive shortness of breath for the last 3 days. Patient has known lung cancer and is being actively treated. She states that starting 2 days ago she began having shortness of breath which has progressed to shortness of breath at rest, this is associated with central sharp constant chest pain. She is also having some nausea without vomiting. She denies any diarrhea. She has noted significantly more bilateral lower extremity edema over these past few days as well. She denies any fever or chills, urinary symptoms, rash, joint pains. See full review of systems below. On evaluation in the ED she was found to have significant opacities on her chest x-ray which were interpreted by radiology as possible pneumonia, pulmonary edema, or lymphangitic spread of her lung cancer. She also meets sepsis criteria with tachypnea and tachycardia with a suspected pneumonia. Hospitalists were called for admission for all of the above. Of note, patient does have a history of a left lower extremity DVT and is on full anticoagulation for this.  Clinical Impression  Pt is received semirecumbent in bed upon entry, family in room, awake, alert, and willing to participate. No acute distress noted. Of note: pt s [place] donned, reporting she is 'taking a break from her oxygen,' and found to be at 86% SaO2.Pt is A&Ox3 and mildly irritable, but making attempts to remain congenial. Pt reports zero falls in the last 6 months, and explains that balance has been off last few days due to inactivity at hospital. Pt strength as assessed during functional mobility assessment presents s impairment. Pt falls risk is high as evidenced by multiple LOB during balance testing and ambulation in room. Pt is on 5L O2 at entry, which adjusted to 3L as  documented previosuly in chart, remaining on 2L O2 throughout assessment of activity tolerance, desaturating with activity (89%), and returned to 3L after evaluation. Pt is on 2L O2 at baseline at home. Patient presenting with impairment of balance, oxygen perfusion, and activity tolerance, limiting ability to perform ADL and mobility tasks at  baseline level of function. Patient will benefit from skilled intervention to address the above impairments and limitations, in order to restore to prior level of function, improve patient safety upon discharge, and to decrease falls risk.       Follow Up Recommendations Home health PT (Pt unsure if she is interested. )    Engineer, technical sales    Recommendations for Other Services       Precautions / Restrictions Precautions Precautions: None Restrictions Weight Bearing Restrictions: No      Mobility  Bed Mobility Overal bed mobility: Independent                Transfers Overall transfer level: Independent                  Ambulation/Gait Ambulation/Gait assistance: Supervision Ambulation Distance (Feet): 180 Feet Assistive device:  (Using IV pole for stability. )   Gait velocity: 0.90 m/s  Gait velocity interpretation: Below normal speed for age/gender General Gait Details: a little unsteady, but able to remain safe c IV pole.   Stairs            Wheelchair Mobility    Modified Rankin (Stroke Patients Only)       Balance Overall balance assessment: Needs assistance Sitting-balance  support: Feet supported;Feet unsupported;No upper extremity supported Sitting balance-Leahy Scale: Good     Standing balance support: No upper extremity supported Standing balance-Leahy Scale: Poor Standing balance comment: Pt c LOB stepping fwd, feet together + eyes closed.     Tandem Stance - Right Leg: 5 Tandem Stance - Left Leg: 1   Rhomberg - Eyes Closed: 6                 Pertinent Vitals/Pain  Pain Assessment:  (feeling generlly poor, but denies pain at this time. )    Home Living Family/patient expects to be discharged to:: Private residence Living Arrangements: Alone Available Help at Discharge: Family Type of Home: Mobile home Home Access: Ramped entrance     Home Layout: One level Home Equipment: Saline - single point (om 2L O2 at baseline)      Prior Function Level of Independence: Independent               Hand Dominance        Extremity/Trunk Assessment   Upper Extremity Assessment: Overall WFL for tasks assessed           Lower Extremity Assessment: Overall WFL for tasks assessed      Cervical / Trunk Assessment: Normal  Communication   Communication: No difficulties  Cognition Arousal/Alertness: Awake/alert Behavior During Therapy: WFL for tasks assessed/performed Overall Cognitive Status: Within Functional Limits for tasks assessed                      General Comments      Exercises        Assessment/Plan    PT Assessment Patient needs continued PT services  PT Diagnosis Abnormality of gait;Other (comment) (Unsteadiness on feet)   PT Problem List Decreased activity tolerance;Decreased balance;Decreased knowledge of precautions;Cardiopulmonary status limiting activity  PT Treatment Interventions Gait training;Balance training;Therapeutic activities   PT Goals (Current goals can be found in the Care Plan section) Acute Rehab PT Goals Patient Stated Goal: Leave hospital and return home ASAP.  PT Goal Formulation: With patient Time For Goal Achievement: 07/12/14 Potential to Achieve Goals: Good    Frequency Min 2X/week   Barriers to discharge        Co-evaluation               End of Session Equipment Utilized During Treatment: Oxygen (2L for AMB, 3L for all other. ) Activity Tolerance: Treatment limited secondary to agitation (Pt reporting that she 'needs space,' and limited also by DOE. ) Patient left: in  bed;with family/visitor present;Other (comment) (O2 donned at 3L. ) Nurse Communication: Mobility status;Precautions         Time: 6712-4580 PT Time Calculation (min) (ACUTE ONLY): 13 min   Charges:   PT Evaluation $Initial PT Evaluation Tier I: 1 Procedure     PT G Codes:        Aurie Harroun C Jul 18, 2014, 11:34 AM  11:40 AM  Etta Grandchild, PT, DPT [place] License # [id number]

## 2014-06-28 NOTE — Discharge Instructions (Signed)
Low sodium diet. Activity as tolerated. O2 [place] 2-3 L, hospice care.

## 2014-07-02 ENCOUNTER — Other Ambulatory Visit: Payer: Self-pay | Admitting: *Deleted

## 2014-07-02 MED ORDER — HYDROMORPHONE HCL 4 MG PO TABS: 4.0000 mg | ORAL_TABLET | ORAL | Status: DC | PRN

## 2014-07-02 NOTE — Telephone Encounter (Signed)
rx faxed adn [facility] notified

## 2014-07-06 ENCOUNTER — Inpatient Hospital Stay: Payer: Medicaid Other | Admitting: Oncology

## 2014-07-12 ENCOUNTER — Telehealth: Payer: Self-pay | Admitting: *Deleted

## 2014-07-12 MED ORDER — FENTANYL 50 MCG/HR TD PT72: 50.0000 ug | MEDICATED_PATCH | TRANSDERMAL | Status: DC

## 2014-07-12 NOTE — Telephone Encounter (Signed)
Faxed rx

## 2014-07-21 ENCOUNTER — Telehealth: Payer: Self-pay | Admitting: *Deleted

## 2014-07-21 MED ORDER — HYDROMORPHONE HCL 4 MG PO TABS: 4.0000 mg | ORAL_TABLET | ORAL | Status: DC | PRN

## 2014-07-21 MED ORDER — FENTANYL 100 MCG/HR TD PT72: 200.0000 ug | MEDICATED_PATCH | TRANSDERMAL | Status: DC

## 2014-07-21 NOTE — Telephone Encounter (Signed)
[facility] notified of increase of fentanyl to 247mg

## 2014-08-04 ENCOUNTER — Telehealth: Payer: Self-pay | Admitting: *Deleted

## 2014-08-04 MED ORDER — FENTANYL 100 MCG/HR TD PT72: 200.0000 ug | MEDICATED_PATCH | TRANSDERMAL | Status: DC

## 2014-08-04 MED ORDER — LIDODERM 5 % EX PTCH: 1.0000 | MEDICATED_PATCH | Freq: Every day | CUTANEOUS | Status: AC

## 2014-08-04 MED ORDER — SCOPOLAMINE 1 MG/3DAYS TD PT72: 1.0000 | MEDICATED_PATCH | TRANSDERMAL | Status: AC

## 2014-08-04 NOTE — Telephone Encounter (Signed)
Faxed and e scribed

## 2014-08-10 ENCOUNTER — Telehealth: Payer: Self-pay | Admitting: *Deleted

## 2014-08-10 MED ORDER — HYDROMORPHONE HCL 4 MG PO TABS: 4.0000 mg | ORAL_TABLET | ORAL | Status: AC | PRN

## 2014-08-10 NOTE — Telephone Encounter (Signed)
faxed

## 2014-08-12 ENCOUNTER — Inpatient Hospital Stay
Admission: EM | Admit: 2014-08-12 | Discharge: 2014-08-17 | DRG: 872 | Disposition: A | Discharge status: Home / Self Care | Attending: Internal Medicine | Admitting: Internal Medicine

## 2014-08-12 ENCOUNTER — Emergency Department

## 2014-08-12 ENCOUNTER — Encounter: Payer: Self-pay | Admitting: Emergency Medicine

## 2014-08-12 DIAGNOSIS — J9610 Chronic respiratory failure, unspecified whether with hypoxia or hypercapnia: Secondary | ICD-10-CM | POA: Diagnosis present

## 2014-08-12 DIAGNOSIS — R55 Syncope and collapse: Secondary | ICD-10-CM

## 2014-08-12 DIAGNOSIS — A411 Sepsis due to other specified staphylococcus: Principal | ICD-10-CM | POA: Diagnosis present

## 2014-08-12 DIAGNOSIS — Z9889 Other specified postprocedural states: Secondary | ICD-10-CM

## 2014-08-12 DIAGNOSIS — Z888 Allergy status to other drugs, medicaments and biological substances status: Secondary | ICD-10-CM

## 2014-08-12 DIAGNOSIS — Z66 Do not resuscitate: Secondary | ICD-10-CM | POA: Diagnosis present

## 2014-08-12 DIAGNOSIS — Z885 Allergy status to narcotic agent status: Secondary | ICD-10-CM

## 2014-08-12 DIAGNOSIS — I10 Essential (primary) hypertension: Secondary | ICD-10-CM | POA: Diagnosis present

## 2014-08-12 DIAGNOSIS — Z515 Encounter for palliative care: Secondary | ICD-10-CM

## 2014-08-12 DIAGNOSIS — Z85118 Personal history of other malignant neoplasm of bronchus and lung: Secondary | ICD-10-CM

## 2014-08-12 DIAGNOSIS — Z801 Family history of malignant neoplasm of trachea, bronchus and lung: Secondary | ICD-10-CM

## 2014-08-12 DIAGNOSIS — A419 Sepsis, unspecified organism: Secondary | ICD-10-CM | POA: Diagnosis present

## 2014-08-12 DIAGNOSIS — Z882 Allergy status to sulfonamides status: Secondary | ICD-10-CM

## 2014-08-12 DIAGNOSIS — Z8249 Family history of ischemic heart disease and other diseases of the circulatory system: Secondary | ICD-10-CM

## 2014-08-12 DIAGNOSIS — G894 Chronic pain syndrome: Secondary | ICD-10-CM | POA: Diagnosis present

## 2014-08-12 DIAGNOSIS — Z87891 Personal history of nicotine dependence: Secondary | ICD-10-CM

## 2014-08-12 DIAGNOSIS — C3490 Malignant neoplasm of unspecified part of unspecified bronchus or lung: Secondary | ICD-10-CM | POA: Diagnosis present

## 2014-08-12 DIAGNOSIS — K2970 Gastritis, unspecified, without bleeding: Secondary | ICD-10-CM | POA: Diagnosis present

## 2014-08-12 DIAGNOSIS — Z803 Family history of malignant neoplasm of breast: Secondary | ICD-10-CM

## 2014-08-12 DIAGNOSIS — E876 Hypokalemia: Secondary | ICD-10-CM

## 2014-08-12 DIAGNOSIS — R404 Transient alteration of awareness: Secondary | ICD-10-CM

## 2014-08-12 DIAGNOSIS — E860 Dehydration: Secondary | ICD-10-CM | POA: Diagnosis present

## 2014-08-12 DIAGNOSIS — R4189 Other symptoms and signs involving cognitive functions and awareness: Secondary | ICD-10-CM

## 2014-08-12 DIAGNOSIS — R531 Weakness: Secondary | ICD-10-CM

## 2014-08-12 DIAGNOSIS — I5032 Chronic diastolic (congestive) heart failure: Secondary | ICD-10-CM | POA: Diagnosis present

## 2014-08-12 DIAGNOSIS — T451X5A Adverse effect of antineoplastic and immunosuppressive drugs, initial encounter: Secondary | ICD-10-CM | POA: Diagnosis present

## 2014-08-12 NOTE — ED Notes (Signed)
Pt was found unresponsive at the home, pt awake and alert upon arrival

## 2014-08-12 NOTE — ED Notes (Signed)
Pt arrived per EMS from home pt is hospice patient due to lung ca, pt was found unresponsive with snoring resp face down at the house.  Per ems the house was very hot, pt was diaphoretic.  Upon coming out of the house started awaken.  She became cooler and had no complaints.  Upon arrival to Ed pt is alert and awake and able to answer questions appropriately.  She has no complaints of pain or shob at this time but she makes inappropriate comments at times.  She had 236mg on fentanyl on chest and also takes dilaudid at home q 4 hrs and was increased about a month ago.

## 2014-08-12 NOTE — ED Notes (Signed)
Pt falls asleep easily but is easily awaken when spoken to, fentanyl patch removed.

## 2014-08-13 DIAGNOSIS — Z888 Allergy status to other drugs, medicaments and biological substances status: Secondary | ICD-10-CM | POA: Diagnosis not present

## 2014-08-13 DIAGNOSIS — E876 Hypokalemia: Secondary | ICD-10-CM | POA: Diagnosis present

## 2014-08-13 DIAGNOSIS — Z8249 Family history of ischemic heart disease and other diseases of the circulatory system: Secondary | ICD-10-CM | POA: Diagnosis not present

## 2014-08-13 DIAGNOSIS — R55 Syncope and collapse: Secondary | ICD-10-CM | POA: Diagnosis present

## 2014-08-13 DIAGNOSIS — Z801 Family history of malignant neoplasm of trachea, bronchus and lung: Secondary | ICD-10-CM | POA: Diagnosis not present

## 2014-08-13 DIAGNOSIS — T451X5A Adverse effect of antineoplastic and immunosuppressive drugs, initial encounter: Secondary | ICD-10-CM | POA: Diagnosis present

## 2014-08-13 DIAGNOSIS — Z803 Family history of malignant neoplasm of breast: Secondary | ICD-10-CM | POA: Diagnosis not present

## 2014-08-13 DIAGNOSIS — I10 Essential (primary) hypertension: Secondary | ICD-10-CM | POA: Diagnosis present

## 2014-08-13 DIAGNOSIS — J9610 Chronic respiratory failure, unspecified whether with hypoxia or hypercapnia: Secondary | ICD-10-CM | POA: Diagnosis present

## 2014-08-13 DIAGNOSIS — Z87891 Personal history of nicotine dependence: Secondary | ICD-10-CM | POA: Diagnosis not present

## 2014-08-13 DIAGNOSIS — K2970 Gastritis, unspecified, without bleeding: Secondary | ICD-10-CM | POA: Diagnosis present

## 2014-08-13 DIAGNOSIS — Z85118 Personal history of other malignant neoplasm of bronchus and lung: Secondary | ICD-10-CM | POA: Diagnosis not present

## 2014-08-13 DIAGNOSIS — Z66 Do not resuscitate: Secondary | ICD-10-CM | POA: Diagnosis present

## 2014-08-13 DIAGNOSIS — Z882 Allergy status to sulfonamides status: Secondary | ICD-10-CM | POA: Diagnosis not present

## 2014-08-13 DIAGNOSIS — Z9889 Other specified postprocedural states: Secondary | ICD-10-CM | POA: Diagnosis not present

## 2014-08-13 DIAGNOSIS — Z515 Encounter for palliative care: Secondary | ICD-10-CM | POA: Diagnosis not present

## 2014-08-13 DIAGNOSIS — E860 Dehydration: Secondary | ICD-10-CM | POA: Diagnosis present

## 2014-08-13 DIAGNOSIS — Z885 Allergy status to narcotic agent status: Secondary | ICD-10-CM | POA: Diagnosis not present

## 2014-08-13 DIAGNOSIS — C3490 Malignant neoplasm of unspecified part of unspecified bronchus or lung: Secondary | ICD-10-CM | POA: Diagnosis present

## 2014-08-13 DIAGNOSIS — A411 Sepsis due to other specified staphylococcus: Secondary | ICD-10-CM | POA: Diagnosis present

## 2014-08-13 DIAGNOSIS — I5032 Chronic diastolic (congestive) heart failure: Secondary | ICD-10-CM | POA: Diagnosis present

## 2014-08-13 DIAGNOSIS — G894 Chronic pain syndrome: Secondary | ICD-10-CM | POA: Diagnosis present

## 2014-08-13 LAB — URINALYSIS COMPLETE WITH MICROSCOPIC (ARMC ONLY)
Bacteria, UA: NONE SEEN
Bilirubin Urine: NEGATIVE
Glucose, UA: NEGATIVE mg/dL
Ketones, ur: NEGATIVE mg/dL
Nitrite: NEGATIVE
PROTEIN: NEGATIVE mg/dL
RBC / HPF: NONE SEEN RBC/hpf (ref 0–5)
Specific Gravity, Urine: 1.006 (ref 1.005–1.030)
pH: 6 (ref 5.0–8.0)

## 2014-08-13 LAB — CBC
HCT: 40.5 % (ref 35.0–47.0)
Hemoglobin: 12.9 g/dL (ref 12.0–16.0)
MCH: 26.1 pg (ref 26.0–34.0)
MCHC: 31.9 g/dL — AB (ref 32.0–36.0)
MCV: 82 fL (ref 80.0–100.0)
PLATELETS: 208 10*3/uL (ref 150–440)
RBC: 4.94 MIL/uL (ref 3.80–5.20)
RDW: 19 % — ABNORMAL HIGH (ref 11.5–14.5)
WBC: 17.4 10*3/uL — AB (ref 3.6–11.0)

## 2014-08-13 LAB — GLUCOSE, CAPILLARY
Glucose-Capillary: 171 mg/dL — ABNORMAL HIGH (ref 65–99)
Glucose-Capillary: 175 mg/dL — ABNORMAL HIGH (ref 65–99)
Glucose-Capillary: 280 mg/dL — ABNORMAL HIGH (ref 65–99)
Glucose-Capillary: 161 mg/dL — ABNORMAL HIGH (ref 65–99)

## 2014-08-13 LAB — BASIC METABOLIC PANEL
ANION GAP: 15 (ref 5–15)
BUN: 29 mg/dL — ABNORMAL HIGH (ref 6–20)
CO2: 32 mmol/L (ref 22–32)
CREATININE: 1.32 mg/dL — AB (ref 0.44–1.00)
Calcium: 9 mg/dL (ref 8.9–10.3)
Chloride: 94 mmol/L — ABNORMAL LOW (ref 101–111)
GFR calc Af Amer: 53 mL/min — ABNORMAL LOW (ref >60)
GFR calc non Af Amer: 46 mL/min — ABNORMAL LOW (ref >60)
Glucose, Bld: 171 mg/dL — ABNORMAL HIGH (ref 65–99)
Potassium: 2.3 mmol/L — CL (ref 3.5–5.1)
SODIUM: 141 mmol/L (ref 135–145)

## 2014-08-13 LAB — HEMOGLOBIN A1C: Hgb A1c MFr Bld: 5.7 % (ref 4.0–6.0)

## 2014-08-13 LAB — TSH: TSH: 2.855 u[IU]/mL (ref 0.350–4.500)

## 2014-08-13 LAB — PROTIME-INR
INR: 1.19
Prothrombin Time: 15.3 seconds — ABNORMAL HIGH (ref 11.4–15.0)

## 2014-08-13 LAB — POTASSIUM: Potassium: 2.9 mmol/L — CL (ref 3.5–5.1)

## 2014-08-13 LAB — TROPONIN I

## 2014-08-13 LAB — CK: CK TOTAL: 462 U/L — AB (ref 38–234)

## 2014-08-13 MED ORDER — SODIUM CHLORIDE 0.9 % IV BOLUS (SEPSIS)
1000.0000 mL | Freq: Once | INTRAVENOUS | Status: AC
  Administered 2014-08-13: 1000 mL via INTRAVENOUS

## 2014-08-13 MED ORDER — APIXABAN 5 MG PO TABS
5.0000 mg | ORAL_TABLET | Freq: Two times a day (BID) | ORAL | Status: DC
  Administered 2014-08-13 – 2014-08-17 (×9): 5 mg via ORAL
  Filled 2014-08-13 (×9): qty 1

## 2014-08-13 MED ORDER — HYDROMORPHONE HCL 2 MG/ML IJ SOLN: 4.0000 mg | INTRAMUSCULAR | Status: DC | PRN

## 2014-08-13 MED ORDER — SODIUM CHLORIDE 0.9 % IJ SOLN
10.0000 mL | INTRAMUSCULAR | Status: DC | PRN
  Administered 2014-08-13 – 2014-08-14 (×2): 10 mL via INTRAVENOUS
  Filled 2014-08-13 (×2): qty 10

## 2014-08-13 MED ORDER — INSULIN ASPART 100 UNIT/ML ~~LOC~~ SOLN
0.0000 [IU] | Freq: Three times a day (TID) | SUBCUTANEOUS | Status: DC
Start: 2014-08-13 — End: 2014-08-17
  Administered 2014-08-13: 5 [IU] via SUBCUTANEOUS
  Administered 2014-08-13 (×2): 2 [IU] via SUBCUTANEOUS
  Administered 2014-08-14: 5 [IU] via SUBCUTANEOUS
  Administered 2014-08-14: 2 [IU] via SUBCUTANEOUS
  Administered 2014-08-14: 5 [IU] via SUBCUTANEOUS
  Administered 2014-08-15 – 2014-08-16 (×4): 3 [IU] via SUBCUTANEOUS
  Administered 2014-08-16: 1 [IU] via SUBCUTANEOUS
  Administered 2014-08-17: 08:00:00 2 [IU] via SUBCUTANEOUS
  Filled 2014-08-13 (×2): qty 5
  Filled 2014-08-13: qty 1
  Filled 2014-08-13: qty 3
  Filled 2014-08-13: qty 5
  Filled 2014-08-13: qty 3
  Filled 2014-08-13 (×2): qty 2
  Filled 2014-08-13: qty 3
  Filled 2014-08-13: qty 2
  Filled 2014-08-13: qty 3

## 2014-08-13 MED ORDER — ACETAMINOPHEN 325 MG PO TABS: 650.0000 mg | ORAL_TABLET | Freq: Four times a day (QID) | ORAL | Status: DC | PRN

## 2014-08-13 MED ORDER — POTASSIUM CHLORIDE CRYS ER 20 MEQ PO TBCR
40.0000 meq | EXTENDED_RELEASE_TABLET | Freq: Once | ORAL | Status: AC
  Administered 2014-08-13: 40 meq via ORAL
  Filled 2014-08-13: qty 2

## 2014-08-13 MED ORDER — SODIUM CHLORIDE 0.9 % IJ SOLN: 3.0000 mL | Freq: Two times a day (BID) | INTRAMUSCULAR | Status: DC

## 2014-08-13 MED ORDER — DOCUSATE SODIUM 100 MG PO CAPS
100.0000 mg | ORAL_CAPSULE | Freq: Two times a day (BID) | ORAL | Status: DC
  Administered 2014-08-13 – 2014-08-17 (×9): 100 mg via ORAL
  Filled 2014-08-13 (×9): qty 1

## 2014-08-13 MED ORDER — INSULIN ASPART 100 UNIT/ML ~~LOC~~ SOLN
0.0000 [IU] | Freq: Every day | SUBCUTANEOUS | Status: DC
  Filled 2014-08-13: qty 2

## 2014-08-13 MED ORDER — FENTANYL 100 MCG/HR TD PT72
200.0000 ug | MEDICATED_PATCH | TRANSDERMAL | Status: DC
  Administered 2014-08-13 – 2014-08-16 (×2): 200 ug via TRANSDERMAL
  Filled 2014-08-13: qty 2
  Filled 2014-08-13: qty 1

## 2014-08-13 MED ORDER — ONDANSETRON HCL 4 MG PO TABS
8.0000 mg | ORAL_TABLET | Freq: Three times a day (TID) | ORAL | Status: DC | PRN
  Administered 2014-08-16: 14:00:00 8 mg via ORAL
  Filled 2014-08-13: qty 2

## 2014-08-13 MED ORDER — HYDROMORPHONE HCL 2 MG PO TABS
4.0000 mg | ORAL_TABLET | ORAL | Status: DC | PRN
  Administered 2014-08-13 (×2): 4 mg via ORAL
  Filled 2014-08-13 (×3): qty 2

## 2014-08-13 MED ORDER — VANCOMYCIN HCL IN DEXTROSE 1-5 GM/200ML-% IV SOLN
1000.0000 mg | Freq: Once | INTRAVENOUS | Status: AC
  Administered 2014-08-13: 1000 mg via INTRAVENOUS
  Filled 2014-08-13: qty 200

## 2014-08-13 MED ORDER — PROMETHAZINE HCL 25 MG/ML IJ SOLN
25.0000 mg | Freq: Four times a day (QID) | INTRAMUSCULAR | Status: DC | PRN
  Administered 2014-08-13 – 2014-08-17 (×13): 25 mg via INTRAVENOUS
  Filled 2014-08-13 (×14): qty 1

## 2014-08-13 MED ORDER — PANTOPRAZOLE SODIUM 40 MG PO TBEC
40.0000 mg | DELAYED_RELEASE_TABLET | Freq: Every day | ORAL | Status: DC
  Administered 2014-08-13 – 2014-08-17 (×5): 40 mg via ORAL
  Filled 2014-08-13 (×5): qty 1

## 2014-08-13 MED ORDER — LIDOCAINE 5 % EX PTCH
1.0000 | MEDICATED_PATCH | Freq: Every day | CUTANEOUS | Status: DC
  Administered 2014-08-13 – 2014-08-17 (×5): 1 via TRANSDERMAL
  Filled 2014-08-13 (×7): qty 1

## 2014-08-13 MED ORDER — DEXAMETHASONE 4 MG PO TABS
4.0000 mg | ORAL_TABLET | Freq: Two times a day (BID) | ORAL | Status: DC
  Administered 2014-08-13 – 2014-08-17 (×9): 4 mg via ORAL
  Filled 2014-08-13 (×12): qty 1

## 2014-08-13 MED ORDER — IPRATROPIUM-ALBUTEROL 20-100 MCG/ACT IN AERS: 1.0000 | INHALATION_SPRAY | Freq: Four times a day (QID) | RESPIRATORY_TRACT | Status: DC | PRN

## 2014-08-13 MED ORDER — PIPERACILLIN-TAZOBACTAM 3.375 G IVPB
3.3750 g | Freq: Three times a day (TID) | INTRAVENOUS | Status: DC
  Administered 2014-08-13 – 2014-08-14 (×4): 3.375 g via INTRAVENOUS
  Filled 2014-08-13 (×8): qty 50

## 2014-08-13 MED ORDER — SODIUM CHLORIDE 0.9 % IJ SOLN: 3.0000 mL | INTRAMUSCULAR | Status: DC | PRN

## 2014-08-13 MED ORDER — GABAPENTIN 300 MG PO CAPS
300.0000 mg | ORAL_CAPSULE | Freq: Three times a day (TID) | ORAL | Status: DC
  Administered 2014-08-13 – 2014-08-17 (×13): 300 mg via ORAL
  Filled 2014-08-13 (×13): qty 1

## 2014-08-13 MED ORDER — IPRATROPIUM-ALBUTEROL 0.5-2.5 (3) MG/3ML IN SOLN: 3.0000 mL | Freq: Four times a day (QID) | RESPIRATORY_TRACT | Status: DC | PRN

## 2014-08-13 MED ORDER — PROMETHAZINE HCL 25 MG PO TABS
25.0000 mg | ORAL_TABLET | Freq: Four times a day (QID) | ORAL | Status: DC | PRN
  Administered 2014-08-13: 25 mg via ORAL
  Filled 2014-08-13: qty 1

## 2014-08-13 MED ORDER — POTASSIUM CHLORIDE IN NACL 20-0.9 MEQ/L-% IV SOLN
Freq: Once | INTRAVENOUS | Status: AC
  Administered 2014-08-13: 01:00:00 via INTRAVENOUS
  Filled 2014-08-13: qty 1000

## 2014-08-13 MED ORDER — HYDROMORPHONE HCL 1 MG/ML IJ SOLN
4.0000 mg | INTRAMUSCULAR | Status: DC | PRN
  Administered 2014-08-13 – 2014-08-17 (×18): 4 mg via INTRAVENOUS
  Filled 2014-08-13 (×19): qty 4

## 2014-08-13 MED ORDER — ACETAMINOPHEN 650 MG RE SUPP: 650.0000 mg | Freq: Four times a day (QID) | RECTAL | Status: DC | PRN

## 2014-08-13 MED ORDER — SCOPOLAMINE 1 MG/3DAYS TD PT72
1.0000 | MEDICATED_PATCH | TRANSDERMAL | Status: DC
  Administered 2014-08-13 – 2014-08-16 (×2): 1.5 mg via TRANSDERMAL
  Filled 2014-08-13 (×2): qty 1

## 2014-08-13 MED ORDER — POTASSIUM CHLORIDE CRYS ER 20 MEQ PO TBCR
20.0000 meq | EXTENDED_RELEASE_TABLET | Freq: Two times a day (BID) | ORAL | Status: DC
  Administered 2014-08-13 – 2014-08-17 (×9): 20 meq via ORAL
  Filled 2014-08-13 (×9): qty 1

## 2014-08-13 MED ORDER — METOPROLOL TARTRATE 25 MG PO TABS
12.5000 mg | ORAL_TABLET | Freq: Two times a day (BID) | ORAL | Status: DC
  Administered 2014-08-13 – 2014-08-17 (×8): 12.5 mg via ORAL
  Filled 2014-08-13 (×9): qty 1

## 2014-08-13 MED ORDER — METOCLOPRAMIDE HCL 5 MG PO TABS: 5.0000 mg | ORAL_TABLET | Freq: Four times a day (QID) | ORAL | Status: DC | PRN

## 2014-08-13 MED ORDER — ESTROGENS, CONJUGATED 0.625 MG/GM VA CREA
1.0000 | TOPICAL_CREAM | VAGINAL | Status: DC
  Administered 2014-08-16: 09:00:00 1 via VAGINAL
  Filled 2014-08-13: qty 30

## 2014-08-13 MED ORDER — FUROSEMIDE 20 MG PO TABS
40.0000 mg | ORAL_TABLET | Freq: Two times a day (BID) | ORAL | Status: DC
  Administered 2014-08-13 – 2014-08-17 (×9): 40 mg via ORAL
  Filled 2014-08-13: qty 1
  Filled 2014-08-13: qty 2
  Filled 2014-08-13: qty 1
  Filled 2014-08-13: qty 2
  Filled 2014-08-13 (×3): qty 1
  Filled 2014-08-13: qty 2
  Filled 2014-08-13: qty 1

## 2014-08-13 MED ORDER — HALOPERIDOL 1 MG PO TABS
5.0000 mg | ORAL_TABLET | Freq: Three times a day (TID) | ORAL | Status: DC | PRN
  Filled 2014-08-13: qty 1

## 2014-08-13 MED ORDER — VANCOMYCIN HCL IN DEXTROSE 1-5 GM/200ML-% IV SOLN
1000.0000 mg | INTRAVENOUS | Status: DC
Start: 2014-08-13 — End: 2014-08-14
  Administered 2014-08-13: 1000 mg via INTRAVENOUS
  Filled 2014-08-13 (×3): qty 200

## 2014-08-13 MED ORDER — DRONABINOL 2.5 MG PO CAPS
5.0000 mg | ORAL_CAPSULE | Freq: Two times a day (BID) | ORAL | Status: DC
  Administered 2014-08-13 – 2014-08-17 (×9): 5 mg via ORAL
  Filled 2014-08-13 (×9): qty 2

## 2014-08-13 MED ORDER — FENTANYL 100 MCG/HR TD PT72
100.0000 ug | MEDICATED_PATCH | TRANSDERMAL | Status: DC
  Administered 2014-08-13: 100 ug via TRANSDERMAL
  Filled 2014-08-13: qty 1
  Filled 2014-08-13: qty 4

## 2014-08-13 NOTE — ED Provider Notes (Signed)
[facility] Emergency Department Provider Note  ____________________________________________  Time seen: Approximately 12:25 AM  I have reviewed the triage vital signs and the nursing notes.   HISTORY  Chief Complaint Loss of Consciousness    HPI [name] is a 54 y.o. female who presents to the ED via EMS from home for an unresponsive episode. Patient is hospice secondary to metastatic lung cancer who reports she was visiting her mother who "always keeps her house to warm". Patient states she felt hot. EMS reports the house was very hot and patient was diaphoretic upon their arrival. After patient was brought outdoors patient woke up. She arrives to the ED alert, oriented and answering questions appropriately. Denies any pain complaints currently. Does report generalized weakness. She is on opiates secondary to lung cancer; fentanyl patch as well as PO Dilaudid. Denies fever, chills, cough, chest pain, shortness of breath increased from baseline, abdominal pain, nausea, vomiting, diarrhea.   Past Medical History  Diagnosis Date  . Pneumonia   . Lung cancer   . DVT (deep venous thrombosis)   . Urinary incontinence   . HTN (hypertension)   . Chronic diastolic congestive heart failure     Patient Active Problem List   Diagnosis Date Noted  . DVT (deep venous thrombosis) 06/22/2014  . Chronic diastolic congestive heart failure 06/22/2014  . Chest pain 06/22/2014  . Acute respiratory failure with hypoxemia 06/22/2014  . Sepsis 06/22/2014  . Hypokalemia 06/22/2014  . Pneumonia 05/16/2014  . Shortness of breath 05/16/2014  . Pleuritic chest pain 01/21/2013  . Pulmonary infiltrates 01/12/2013  . Adenocarcinoma of lung, stage 4 01/12/2013  . Pericardial tamponade 01/11/2013    Past Surgical History  Procedure Laterality Date  . Video bronchoscopy Bilateral 01/14/2013    Procedure: VIDEO BRONCHOSCOPY WITHOUT FLUORO;  Surgeon: Wilhelmina Mcardle,  MD;  Location: [facility] ENDOSCOPY;  Service: Cardiopulmonary;  Laterality: Bilateral;  . Video bronchoscopy N/A 01/27/2013    Procedure: VIDEO BRONCHOSCOPY;  Surgeon: Ivin Poot, MD;  Location: [place];  Service: Thoracic;  Laterality: N/A;  . Video assisted thoracoscopy Right 01/27/2013    Procedure: VIDEO ASSISTED THORACOSCOPY;  Surgeon: Ivin Poot, MD;  Location: [place];  Service: Thoracic;  Laterality: Right;  . Lung biopsy Right 01/27/2013    Procedure: LUNG BIOPSY;  Surgeon: Ivin Poot, MD;  Location: [place];  Service: Thoracic;  Laterality: Right;  . Pericardial tap N/A 01/11/2013    Procedure: PERICARDIAL TAP;  Surgeon: Blane Ohara, MD;  Location: [facility] CATH LAB;  Service: Cardiovascular;  Laterality: N/A;  . Bladder surgery      Current Outpatient Rx  Name  Route  Sig  Dispense  Refill  . apixaban (ELIQUIS) 5 MG TABS tablet   Oral   Take 1 tablet (5 mg total) by mouth 2 (two) times daily.   60 tablet   0   . conjugated estrogens (PREMARIN) vaginal cream   Vaginal   Place 1 Applicatorful vaginally 3 (three) times a week. Pt uses on Monday, Wednesday, and Friday.         Marland Kitchen dexamethasone (DECADRON) 4 MG tablet   Oral   Take 4 mg by mouth 2 (two) times daily.      4   . fentaNYL (DURAGESIC - DOSED MCG/HR) 100 MCG/HR   Transdermal   Place 2 patches (200 mcg total) onto the skin every 3 (three) days.   10 patch   0     HOSPICE  PATIENT   . furosemide (LASIX) 40 MG tablet   Oral   Take 1 tablet (40 mg total) by mouth 2 (two) times daily.   180 tablet   0   . gabapentin (NEURONTIN) 300 MG capsule   Oral   Take 1 capsule (300 mg total) by mouth 3 (three) times daily.   90 capsule   1   . HYDROmorphone (DILAUDID) 4 MG tablet   Oral   Take 1 tablet (4 mg total) by mouth every 4 (four) hours as needed for severe pain.   90 tablet   0     HOSPICE PATIENT   . Ipratropium-Albuterol (COMBIVENT) 20-100 MCG/ACT AERS respimat   Inhalation   Inhale 1 puff into the  lungs 4 (four) times daily as needed. For shortness of breath and wheezing Patient taking differently: Inhale 1 puff into the lungs 4 (four) times daily as needed for wheezing or shortness of breath.    1 Inhaler   1   . LIDODERM 5 %   Transdermal   Place 1 patch onto the skin daily.   30 patch   0     Dispense as written.   . metoCLOPramide (REGLAN) 5 MG tablet   Oral   Take 5 mg by mouth 4 (four) times daily as needed for nausea.         . metoprolol tartrate (LOPRESSOR) 25 MG tablet   Oral   Take 0.5 tablets (12.5 mg total) by mouth 2 (two) times daily.   30 tablet   0     Hold if SBP<100 or DBP<60   . omeprazole (PRILOSEC) 20 MG capsule   Oral   Take 20 mg by mouth daily.         . ondansetron (ZOFRAN) 8 MG tablet   Oral   Take 8 mg by mouth every 8 (eight) hours as needed for nausea or vomiting.         . potassium chloride SA (K-DUR,KLOR-CON) 20 MEQ tablet   Oral   Take 1 tablet (20 mEq total) by mouth 2 (two) times daily.   60 tablet   1   . promethazine (PHENERGAN) 25 MG tablet   Oral   Take 1 tablet (25 mg total) by mouth every 6 (six) hours as needed. For nausea and vomiting. Patient taking differently: Take 25 mg by mouth every 6 (six) hours as needed for nausea or vomiting.    45 tablet   2   . scopolamine (TRANSDERM-SCOP) 1 MG/3DAYS   Transdermal   Place 1 patch (1.5 mg total) onto the skin every 3 (three) days.   30 patch   0   . dronabinol (MARINOL) 5 MG capsule   Oral   Take 5 mg by mouth 2 (two) times daily.          . haloperidol (HALDOL) 5 MG tablet   Oral   Take 1 tablet by mouth every 8 (eight) hours as needed.      0   . predniSONE (DELTASONE) 5 MG tablet   Oral   Take 2 tablets (10 mg total) by mouth daily with breakfast. 10 mg po daily for 2 days, 5 mg po daily for 2 days. Patient not taking: Reported on 08/13/2014   6 tablet   0   . sulfamethoxazole-trimethoprim (BACTRIM,SEPTRA) 400-80 MG per tablet   Oral   Take 2  tablets by mouth every 12 (twelve) hours. Patient not taking: Reported on 08/13/2014  10 tablet   0     Allergies Review of patient's allergies indicates no known allergies.  Family History  Problem Relation Age of Onset  . Coronary artery disease Mother 30  . Lung cancer Mother   . Prostate cancer Father   . Breast cancer Mother   . Heart attack Mother     Social History Social History  Substance Use Topics  . Smoking status: Former Research scientist (life sciences)  . Smokeless tobacco: Never Used  . Alcohol Use: Yes     Comment: occasionally has a drink    Review of Systems Constitutional: No fever/chills Eyes: No visual changes. ENT: No sore throat. Cardiovascular: Denies chest pain. Respiratory: Denies shortness of breath. Gastrointestinal: No abdominal pain.  No nausea, no vomiting.  No diarrhea.  No constipation. Genitourinary: Negative for dysuria. Musculoskeletal: Negative for back pain. Skin: Negative for rash. Neurological: Negative for headaches, focal weakness or numbness.  10-point ROS otherwise negative.  ____________________________________________   PHYSICAL EXAM:  VITAL SIGNS: ED Triage Vitals  Enc Vitals Group     BP 08/12/14 2331 120/88 mmHg     Pulse Rate 08/12/14 2331 133     Resp 08/12/14 2331 18     Temp 08/12/14 2331 100.2 F (37.9 C)     Temp Source 08/12/14 2331 Oral     SpO2 08/12/14 2331 85 %     Weight 08/12/14 2331 155 lb (70.308 kg)     Height 08/12/14 2331 '5\' 6"'$  (1.676 m)     Head Cir --      Peak Flow --      Pain Score --      Pain Loc --      Pain Edu? --      Excl. in [place]? --     Constitutional: Alert and oriented. Well appearing and in no acute distress. Eyes: Conjunctivae are normal. PERRL. EOMI. Pupils are not pinpoint. Head: Atraumatic. Nose: No congestion/rhinnorhea. Mouth/Throat: Mucous membranes are mildly dry.  Oropharynx non-erythematous. Neck: No stridor.   Cardiovascular: Tachycardic, regular rhythm. Grossly normal heart  sounds.  Good peripheral circulation. Respiratory: Normal respiratory effort.  No retractions. Lungs with scattered rhonchi. Gastrointestinal: Soft and nontender. No distention. No abdominal bruits. No CVA tenderness. Musculoskeletal: No lower extremity tenderness nor edema.  No joint effusions. Neurologic:  Normal speech and language. No gross focal neurologic deficits are appreciated. No gait instability. Skin:  Skin is hot, and intact. No rash noted. Psychiatric: Mood and affect are normal. Speech and behavior are normal.  ____________________________________________   LABS (all labs ordered are listed, but only abnormal results are displayed)  Labs Reviewed  CBC - Abnormal; Notable for the following:    WBC 17.4 (*)    MCHC 31.9 (*)    RDW 19.0 (*)    All other components within normal limits  BASIC METABOLIC PANEL - Abnormal; Notable for the following:    Potassium 2.3 (*)    Chloride 94 (*)    Glucose, Bld 171 (*)    BUN 29 (*)    Creatinine, Ser 1.32 (*)    GFR calc non Af Amer 46 (*)    GFR calc Af Amer 53 (*)    All other components within normal limits  PROTIME-INR - Abnormal; Notable for the following:    Prothrombin Time 15.3 (*)    All other components within normal limits  TROPONIN I  URINALYSIS COMPLETEWITH MICROSCOPIC ([facility] ONLY)  CK   ____________________________________________  EKG  ED ECG REPORT I,  Satara Virella J, the attending physician, personally viewed and interpreted this ECG.   Date: 08/13/2014  EKG Time: 0040  Rate: 120  Rhythm: sinus tachycardia  Axis: Normal  Intervals:none  ST&T Change: Inferior lateral T-wave depression  ____________________________________________  RADIOLOGY  CT head without contrast interpreted per Dr. Radene Knee: 1. No acute intracranial pathology seen on CT. 2. Mild small vessel ischemic microangiopathy.  Chest 2 view (viewed by me, interpreted per Dr. Jasmine December): Widespread reticulonodular interstitial disease  throughout the lungs. Differential considerations include lymphangitic spread of tumor; atypical infection; and noncardiogenic pulmonary edema. No adenopathy appreciable. Heart size within normal limits. ____________________________________________   PROCEDURES  Procedure(s) performed: None  Critical Care performed: No  ____________________________________________   INITIAL IMPRESSION / ASSESSMENT AND PLAN / ED COURSE  Pertinent labs & imaging results that were available during my care of the patient were reviewed by me and considered in my medical decision making (see chart for details).  54 year old female with metastatic lung cancer with syncopal episode at home. She is currently awake and oriented. Labs notable for leukocytosis and hypokalemia. Will replete with PO and IV potassium. Will initiate IV fluid resuscitation.  ----------------------------------------- 1:40 AM on 08/13/2014 -----------------------------------------  Tachycardia improving; currently heart rate 113. Updated patient and family of laboratory and imaging results. Will discuss with hospitalist for ED evaluation for hospital admission. ____________________________________________   FINAL CLINICAL IMPRESSION(S) / ED DIAGNOSES  Final diagnoses:  Unresponsive episode  Hypokalemia  Syncope, unspecified syncope type  Weakness      Paulette Blanch, MD 08/13/14 [phone]

## 2014-08-13 NOTE — ED Notes (Signed)
md into eval .

## 2014-08-13 NOTE — ED Notes (Signed)
Pt awaiting to be seen by hospitalist.

## 2014-08-13 NOTE — ED Notes (Signed)
Report called to [employer], pt admitted to 234

## 2014-08-13 NOTE — Progress Notes (Signed)
Pt requested to be a full code. Dr. Earleen Newport acknowledged the order.

## 2014-08-13 NOTE — H&P (Signed)
[name] is an 54 y.o. female.   Chief Complaint: Syncope HPI: The patient presents emergency department complaining of loss of consciousness. She denies lightheadedness or dizziness but admits to remembering the feeling of being excessively hot and fatigued. She states that the room which she was preparing for bed this evening is extremely hot and that she can recall sweat dripping from her nose prior to awakening in the back of the ambulance. She denies shortness of breath or chest pain. In the emergency department the patient appeared to be very somnolent but it was discovered that she had a 200 g fentanyl patch in place. The patch was removed and the patient promptly became more alert. Narcan was not given as IV access was difficult to establish prior to arrival. In the emergency department the patient was found to have cytosis, fever and tachycardia which prompted the emergency department to call for admission.  Past Medical History  Diagnosis Date  . Pneumonia   . Lung cancer   . DVT (deep venous thrombosis)   . Urinary incontinence   . HTN (hypertension)   . Chronic diastolic congestive heart failure     Past Surgical History  Procedure Laterality Date  . Video bronchoscopy Bilateral 01/14/2013    Procedure: VIDEO BRONCHOSCOPY WITHOUT FLUORO;  Surgeon: Wilhelmina Mcardle, MD;  Location: [facility] ENDOSCOPY;  Service: Cardiopulmonary;  Laterality: Bilateral;  . Video bronchoscopy N/A 01/27/2013    Procedure: VIDEO BRONCHOSCOPY;  Surgeon: Ivin Poot, MD;  Location: [place];  Service: Thoracic;  Laterality: N/A;  . Video assisted thoracoscopy Right 01/27/2013    Procedure: VIDEO ASSISTED THORACOSCOPY;  Surgeon: Ivin Poot, MD;  Location: [place];  Service: Thoracic;  Laterality: Right;  . Lung biopsy Right 01/27/2013    Procedure: LUNG BIOPSY;  Surgeon: Ivin Poot, MD;  Location: [place];  Service: Thoracic;  Laterality: Right;  . Pericardial tap N/A 01/11/2013    Procedure:  PERICARDIAL TAP;  Surgeon: Blane Ohara, MD;  Location: [facility] CATH LAB;  Service: Cardiovascular;  Laterality: N/A;  . Bladder surgery      Family History  Problem Relation Age of Onset  . Coronary artery disease Mother 69  . Lung cancer Mother   . Prostate cancer Father   . Breast cancer Mother   . Heart attack Mother    Social History:  reports that she has quit smoking. She has never used smokeless tobacco. She reports that she drinks alcohol. She reports that she does not use illicit drugs.  Allergies: No Known Allergies  Medications Prior to Admission  Medication Sig Dispense Refill  . apixaban (ELIQUIS) 5 MG TABS tablet Take 1 tablet (5 mg total) by mouth 2 (two) times daily. 60 tablet 0  . conjugated estrogens (PREMARIN) vaginal cream Place 1 Applicatorful vaginally 3 (three) times a week. Pt uses on Monday, Wednesday, and Friday.    Marland Kitchen dexamethasone (DECADRON) 4 MG tablet Take 4 mg by mouth 2 (two) times daily.  4  . fentaNYL (DURAGESIC - DOSED MCG/HR) 100 MCG/HR Place 2 patches (200 mcg total) onto the skin every 3 (three) days. 10 patch 0  . furosemide (LASIX) 40 MG tablet Take 1 tablet (40 mg total) by mouth 2 (two) times daily. 180 tablet 0  . gabapentin (NEURONTIN) 300 MG capsule Take 1 capsule (300 mg total) by mouth 3 (three) times daily. 90 capsule 1  . HYDROmorphone (DILAUDID) 4 MG tablet Take 1 tablet (4 mg total) by mouth every  4 (four) hours as needed for severe pain. 90 tablet 0  . Ipratropium-Albuterol (COMBIVENT) 20-100 MCG/ACT AERS respimat Inhale 1 puff into the lungs 4 (four) times daily as needed. For shortness of breath and wheezing (Patient taking differently: Inhale 1 puff into the lungs 4 (four) times daily as needed for wheezing or shortness of breath. ) 1 Inhaler 1  . LIDODERM 5 % Place 1 patch onto the skin daily. 30 patch 0  . metoCLOPramide (REGLAN) 5 MG tablet Take 5 mg by mouth 4 (four) times daily as needed for nausea.    . metoprolol tartrate  (LOPRESSOR) 25 MG tablet Take 0.5 tablets (12.5 mg total) by mouth 2 (two) times daily. 30 tablet 0  . omeprazole (PRILOSEC) 20 MG capsule Take 20 mg by mouth daily.    . ondansetron (ZOFRAN) 8 MG tablet Take 8 mg by mouth every 8 (eight) hours as needed for nausea or vomiting.    . potassium chloride SA (K-DUR,KLOR-CON) 20 MEQ tablet Take 1 tablet (20 mEq total) by mouth 2 (two) times daily. 60 tablet 1  . promethazine (PHENERGAN) 25 MG tablet Take 1 tablet (25 mg total) by mouth every 6 (six) hours as needed. For nausea and vomiting. (Patient taking differently: Take 25 mg by mouth every 6 (six) hours as needed for nausea or vomiting. ) 45 tablet 2  . scopolamine (TRANSDERM-SCOP) 1 MG/3DAYS Place 1 patch (1.5 mg total) onto the skin every 3 (three) days. 30 patch 0  . dronabinol (MARINOL) 5 MG capsule Take 5 mg by mouth 2 (two) times daily.     . haloperidol (HALDOL) 5 MG tablet Take 1 tablet by mouth every 8 (eight) hours as needed.  0  . predniSONE (DELTASONE) 5 MG tablet Take 2 tablets (10 mg total) by mouth daily with breakfast. 10 mg po daily for 2 days, 5 mg po daily for 2 days. (Patient not taking: Reported on 08/13/2014) 6 tablet 0  . sulfamethoxazole-trimethoprim (BACTRIM,SEPTRA) 400-80 MG per tablet Take 2 tablets by mouth every 12 (twelve) hours. (Patient not taking: Reported on 08/13/2014) 10 tablet 0    Results for orders placed or performed during the hospital encounter of 08/12/14 (from the past 48 hour(s))  CBC     Status: Abnormal   Collection Time: 08/12/14 11:55 PM  Result Value Ref Range   WBC 17.4 (H) 3.6 - 11.0 K/uL   RBC 4.94 3.80 - 5.20 MIL/uL   Hemoglobin 12.9 12.0 - 16.0 g/dL   HCT 40.5 35.0 - 47.0 %   MCV 82.0 80.0 - 100.0 fL   MCH 26.1 26.0 - 34.0 pg   MCHC 31.9 (L) 32.0 - 36.0 g/dL   RDW 19.0 (H) 11.5 - 14.5 %   Platelets 208 150 - 440 K/uL  Basic metabolic panel     Status: Abnormal   Collection Time: 08/12/14 11:55 PM  Result Value Ref Range   Sodium 141 135  - 145 mmol/L   Potassium 2.3 (LL) 3.5 - 5.1 mmol/L    Comment: CRITICAL RESULT CALLED TO, READ BACK BY AND VERIFIED WITH RAQUEL DAVID @0041  08/13/14 BY AJO    Chloride 94 (L) 101 - 111 mmol/L   CO2 32 22 - 32 mmol/L   Glucose, Bld 171 (H) 65 - 99 mg/dL   BUN 29 (H) 6 - 20 mg/dL   Creatinine, Ser 1.32 (H) 0.44 - 1.00 mg/dL   Calcium 9.0 8.9 - 10.3 mg/dL   GFR calc non Af Amer 46 (L) >  60 mL/min   GFR calc Af Amer 53 (L) >60 mL/min    Comment: (NOTE) The eGFR has been calculated using the CKD EPI equation. This calculation has not been validated in all clinical situations. eGFR's persistently <60 mL/min signify possible Chronic Kidney Disease.    Anion gap 15 5 - 15  Troponin I     Status: None   Collection Time: 08/12/14 11:55 PM  Result Value Ref Range   Troponin I <0.03 <0.031 ng/mL    Comment:        NO INDICATION OF MYOCARDIAL INJURY.   Protime-INR     Status: Abnormal   Collection Time: 08/12/14 11:55 PM  Result Value Ref Range   Prothrombin Time 15.3 (H) 11.4 - 15.0 seconds   INR 1.19   CK     Status: Abnormal   Collection Time: 08/12/14 11:55 PM  Result Value Ref Range   Total CK 462 (H) 38 - 234 U/L  TSH     Status: None   Collection Time: 08/12/14 11:55 PM  Result Value Ref Range   TSH 2.855 0.350 - 4.500 uIU/mL  Urinalysis complete, with microscopic ([facility] only)     Status: Abnormal   Collection Time: 08/13/14  1:22 AM  Result Value Ref Range   Color, Urine STRAW (A) YELLOW   APPearance CLEAR (A) CLEAR   Glucose, UA NEGATIVE NEGATIVE mg/dL   Bilirubin Urine NEGATIVE NEGATIVE   Ketones, ur NEGATIVE NEGATIVE mg/dL   Specific Gravity, Urine 1.006 1.005 - 1.030   Hgb urine dipstick 2+ (A) NEGATIVE   pH 6.0 5.0 - 8.0   Protein, ur NEGATIVE NEGATIVE mg/dL   Nitrite NEGATIVE NEGATIVE   Leukocytes, UA TRACE (A) NEGATIVE   RBC / HPF NONE SEEN 0 - 5 RBC/hpf   WBC, UA 0-5 0 - 5 WBC/hpf   Bacteria, UA NONE SEEN NONE SEEN   Squamous Epithelial / LPF 0-5 (A) NONE  SEEN   Mucous PRESENT    Hyaline Casts, UA PRESENT    Dg Chest 2 View  08/13/2014   CLINICAL DATA:  Shortness of breath.  History of lung carcinoma  EXAM: CHEST  2 VIEW  COMPARISON:  June 22, 2014  FINDINGS: There is widespread reticulonodular interstitial disease throughout the lungs. There is no airspace consolidation. The heart size and pulmonary vascularity are normal. No adenopathy. Port-A-Cath tip is in the superior vena cava. No pneumothorax. No bone lesions.  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 Differential considerations include lymphangitic spread of tumor; atypical infection; and noncardiogenic pulmonary edema. No adenopathy appreciable. Heart size within normal limits.   Electronically Signed   By: Lowella Grip III M.D.   On: 08/13/2014 00:22   Ct Head Wo Contrast  08/13/2014   CLINICAL DATA:  Patient found unresponsive. Diaphoresis. Initial encounter.  EXAM: CT HEAD WITHOUT CONTRAST  TECHNIQUE: Contiguous axial images were obtained from the base of the skull through the vertex without intravenous contrast.  COMPARISON:  CT of the head performed 08/20/2013, and MRI of the brain performed 02/02/2014  FINDINGS: There is no evidence of acute infarction, mass lesion, or intra- or extra-axial hemorrhage on CT.  Mild subcortical white matter change likely reflects small vessel ischemic microangiopathy.  The posterior fossa, including the cerebellum, brainstem and fourth ventricle, is within normal limits. The third and lateral ventricles, and basal ganglia are unremarkable in appearance. The cerebral hemispheres are symmetric in appearance, with normal gray-white differentiation. No mass effect or midline shift is  seen.  There is no evidence of fracture; visualized osseous structures are unremarkable in appearance. The visualized portions of the orbits are within normal limits. The paranasal sinuses and mastoid air cells are well-aerated. No  significant soft tissue abnormalities are seen.  IMPRESSION: 1. No acute intracranial pathology seen on CT. 2. Mild small vessel ischemic microangiopathy.   Electronically Signed   By: Garald Balding M.D.   On: 08/13/2014 00:37    Review of Systems  Constitutional: Negative for fever and chills.  HENT: Negative for sore throat and tinnitus.   Eyes: Negative for blurred vision and redness.  Respiratory: Negative for cough and shortness of breath.   Cardiovascular: Negative for chest pain, palpitations, orthopnea and PND.  Gastrointestinal: Negative for nausea, vomiting, abdominal pain and diarrhea.  Genitourinary: Negative for dysuria, urgency and frequency.  Musculoskeletal: Negative for myalgias and joint pain.  Skin: Negative for rash.       No lesions  Neurological: Negative for speech change, focal weakness and weakness.  Endo/Heme/Allergies: Does not bruise/bleed easily.       No temperature intolerance  Psychiatric/Behavioral: Negative for depression and suicidal ideas.    Blood pressure 139/78, pulse 111, temperature 97.9 F (36.6 C), temperature source Oral, resp. rate 18, height 5' 6"  (1.676 m), weight 70.444 kg (155 lb 4.8 oz), SpO2 90 %. Physical Exam  Vitals reviewed. Constitutional: She is oriented to person, place, and time. She appears well-developed and well-nourished. No distress.  HENT:  Head: Normocephalic and atraumatic.  Mouth/Throat: Oropharynx is clear and moist.  Eyes: Conjunctivae and EOM are normal. Pupils are equal, round, and reactive to light. No scleral icterus.  Neck: Normal range of motion. Neck supple. No JVD present. No tracheal deviation present. No thyromegaly present.  Cardiovascular: Normal rate, regular rhythm and normal heart sounds.  Exam reveals no gallop and no friction rub.   No murmur heard. Respiratory: Effort normal and breath sounds normal.  GI: Soft. Bowel sounds are normal. She exhibits no distension. There is no tenderness.   Genitourinary:  Deferred  Musculoskeletal: She exhibits edema (2+).  Lymphadenopathy:    She has no cervical adenopathy.  Neurological: She is alert and oriented to person, place, and time. No cranial nerve deficit. She exhibits normal muscle tone.  Skin: Skin is warm and dry. No rash noted. No erythema.  Psychiatric: She has a normal mood and affect. Her behavior is normal. Judgment and thought content normal.     Assessment/Plan This is a 54 year old Caucasian female admitted for sepsis and syncope secondary to dehydration. 1. Sepsis: The patient meets criteria via temperature, heart rate and leukocytosis. Infection source is unclear at this time. Blood cultures have been obtained and I have started the patient on broad-spectrum antibiotics. We'll follow blood cultures for sensitivities. 2. Syncope: Likely secondary to orthostasis, dehydration, and narcotic medication. The patient's temperature may be environmental or could be a combination of sepsis and high environmental temperatures in a poorly ventilated room. I have decreased her fentanyl dose to 100 g patch. No Narcan is required at this time.  Continue to fluid resuscitate. 3. Hypokalemia: Potassium repleted via IV and oral intake in the emergency department. Continue to follow electrolytes 4. Adenocarcinoma of the lung: Stage IV; the patient has declined further chemotherapy. She is on hospice care at home. 5. DVT prophylaxis: Eliquis 6. GI prophylaxis: Pantoprazole due to chemotherapy-induced gastritis 7. Disposition: Palliative care consult for help with living will. She intends to edit her advanced directives and limit her  resuscitation status in the morning once she is able to talk with her daughter, [name], who is healthcare power of attorney. The patient is a full code. Time spent on admission orders and patient care approximately 45 minutes.   Harrie Foreman 08/13/2014, 5:51 AM

## 2014-08-13 NOTE — Progress Notes (Signed)
ANTIBIOTIC CONSULT NOTE - INITIAL  Pharmacy Consult for vancomycin and zosyn dosing Indication: sepsis  No Known Allergies  Patient Measurements: Height: '5\' 6"'$  (167.6 cm) Weight: 155 lb 4.8 oz (70.444 kg) IBW/kg (Calculated) : 59.3 Adjusted Body Weight: 70.3kg  Vital Signs: Temp: 97.9 F (36.6 C) (08/12 0411) Temp Source: Oral (08/12 0411) BP: 139/78 mmHg (08/12 0411) Pulse Rate: 111 (08/12 0411) Intake/Output from previous day: 08/11 0701 - 08/12 0700 In: -  Out: 1 [Urine:1] Intake/Output from this shift: Total I/O In: -  Out: 1 [Urine:1]  Labs:  Recent Labs  08/12/14 2355  WBC 17.4*  HGB 12.9  PLT 208  CREATININE 1.32*   Estimated Creatinine Clearance: 47.2 mL/min (by C-G formula based on Cr of 1.32). No results for input(s): VANCOTROUGH, VANCOPEAK, VANCORANDOM, GENTTROUGH, GENTPEAK, GENTRANDOM, TOBRATROUGH, TOBRAPEAK, TOBRARND, AMIKACINPEAK, AMIKACINTROU, AMIKACIN in the last 72 hours.   Microbiology: No results found for this or any previous visit (from the past 720 hour(s)).  Medical History: Past Medical History  Diagnosis Date  . Pneumonia   . Lung cancer   . DVT (deep venous thrombosis)   . Urinary incontinence   . HTN (hypertension)   . Chronic diastolic congestive heart failure     Medications:   Assessment: Blood cx pending UA: LE(tr) NO2(-) WBC 0-5  Goal of Therapy:  Vancomycin trough level 15-20 mcg/ml  Plan:  TBW70.3kg  IBW 59.3kg  DW 70.3kg  Vd 49L kei 0.043 hr-1  T1/2 16 hours 1 gram q 18 hours ordered with stacked dosing. Level before 5th dose.   Zosyn 3.375 grams q 8 hours ordered.   Sim Boast, PharmD, BCPS  08/13/2014

## 2014-08-13 NOTE — Progress Notes (Signed)
Notified physician of positive blood cultures, no new orders.

## 2014-08-13 NOTE — ED Notes (Signed)
HOSPITALIST IN TO ADMIT

## 2014-08-13 NOTE — ED Notes (Signed)
Pt to ct and cxr
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
## 2014-08-14 LAB — GLUCOSE, CAPILLARY
Glucose-Capillary: 162 mg/dL — ABNORMAL HIGH (ref 65–99)
Glucose-Capillary: 254 mg/dL — ABNORMAL HIGH (ref 65–99)
Glucose-Capillary: 290 mg/dL — ABNORMAL HIGH (ref 65–99)

## 2014-08-14 LAB — BASIC METABOLIC PANEL
Anion gap: 10 (ref 5–15)
BUN: 20 mg/dL (ref 6–20)
CALCIUM: 8.3 mg/dL — AB (ref 8.9–10.3)
CO2: 33 mmol/L — ABNORMAL HIGH (ref 22–32)
Chloride: 95 mmol/L — ABNORMAL LOW (ref 101–111)
Creatinine, Ser: 0.75 mg/dL (ref 0.44–1.00)
GFR calc Af Amer: >60 mL/min (ref >60)
GFR calc non Af Amer: >60 mL/min (ref >60)
GLUCOSE: 172 mg/dL — AB (ref 65–99)
Potassium: 3.4 mmol/L — ABNORMAL LOW (ref 3.5–5.1)
SODIUM: 138 mmol/L (ref 135–145)

## 2014-08-14 LAB — CBC
HEMATOCRIT: 34.9 % — AB (ref 35.0–47.0)
HEMOGLOBIN: 11.2 g/dL — AB (ref 12.0–16.0)
MCH: 26 pg (ref 26.0–34.0)
MCHC: 32.1 g/dL (ref 32.0–36.0)
MCV: 80.8 fL (ref 80.0–100.0)
Platelets: 164 10*3/uL (ref 150–440)
RBC: 4.32 MIL/uL (ref 3.80–5.20)
RDW: 19.1 % — ABNORMAL HIGH (ref 11.5–14.5)
WBC: 6.8 10*3/uL (ref 3.6–11.0)

## 2014-08-14 MED ORDER — VANCOMYCIN HCL IN DEXTROSE 1-5 GM/200ML-% IV SOLN
1000.0000 mg | Freq: Two times a day (BID) | INTRAVENOUS | Status: DC
  Filled 2014-08-14 (×3): qty 200

## 2014-08-14 MED ORDER — VANCOMYCIN HCL IN DEXTROSE 1-5 GM/200ML-% IV SOLN
1000.0000 mg | Freq: Two times a day (BID) | INTRAVENOUS | Status: DC
  Administered 2014-08-14 – 2014-08-17 (×7): 1000 mg via INTRAVENOUS
  Filled 2014-08-14 (×10): qty 200

## 2014-08-14 NOTE — Progress Notes (Signed)
ANTIBIOTIC CONSULT NOTE - FOLLOW UP   Pharmacy Consult for vancomycin  Indication: sepsis  No Known Allergies  Patient Measurements: Height: '5\' 6"'$  (167.6 cm) Weight: 156 lb 11.2 oz (71.079 kg) IBW/kg (Calculated) : 59.3 Adjusted Body Weight: 70.3kg  Vital Signs: Temp: 97.8 F (36.6 C) (08/13 0820) Temp Source: Oral (08/13 0820) BP: 102/64 mmHg (08/13 0820) Pulse Rate: 77 (08/13 0820) Intake/Output from previous day: 08/12 0701 - 08/13 0700 In: 780 [P.O.:480; IV Piggyback:300] Out: 1000 [Urine:1000] Intake/Output from this shift:    Labs:  Recent Labs  08/12/14 2355 08/14/14 0438  WBC 17.4* 6.8  HGB 12.9 11.2*  PLT 208 164  CREATININE 1.32* 0.75   Estimated Creatinine Clearance: 77.9 mL/min (by C-G formula based on Cr of 0.75). No results for input(s): VANCOTROUGH, VANCOPEAK, VANCORANDOM, GENTTROUGH, GENTPEAK, GENTRANDOM, TOBRATROUGH, TOBRAPEAK, TOBRARND, AMIKACINPEAK, AMIKACINTROU, AMIKACIN in the last 72 hours.   Microbiology: Recent Results (from the past 720 hour(s))  Culture, blood (routine x 2)     Status: None (Preliminary result)   Collection Time: 08/13/14  3:17 AM  Result Value Ref Range Status   Specimen Description PORTA CATH  Final   Special Requests BOTTLES DRAWN AEROBIC AND ANAEROBIC 5ML  Final   Culture  Setup Time   Final    GRAM POSITIVE COCCI IN BOTH AEROBIC AND ANAEROBIC BOTTLES CRITICAL RESULT CALLED TO, READ BACK BY AND VERIFIED WITH: MARCEL TURNER AT 2040 08/13/14.PMH CONFIRMED BY HS    Culture   Final    GRAM POSITIVE COCCI IN BOTH AEROBIC AND ANAEROBIC BOTTLES IDENTIFICATION TO FOLLOW    Report Status PENDING  Incomplete  Culture, blood (routine x 2)     Status: None (Preliminary result)   Collection Time: 08/13/14  3:17 AM  Result Value Ref Range Status   Specimen Description PORTA CATH  Final   Special Requests BOTTLES DRAWN AEROBIC AND ANAEROBIC 6ML  Final   Culture  Setup Time   Final    GRAM POSITIVE COCCI IN BOTH AEROBIC  AND ANAEROBIC BOTTLES CRITICAL RESULT CALLED TO, READ BACK BY AND VERIFIED WITH: MARCEL TURNER AT 2040 08/13/14.PMH CONFIRMED BY HS    Culture   Final    GRAM POSITIVE COCCI IN BOTH AEROBIC AND ANAEROBIC BOTTLES IDENTIFICATION TO FOLLOW    Report Status PENDING  Incomplete    Medical History: Past Medical History  Diagnosis Date  . Pneumonia   . Lung cancer   . DVT (deep venous thrombosis)   . Urinary incontinence   . HTN (hypertension)   . Chronic diastolic congestive heart failure     Medications:   Assessment: Blood cx pending UA: LE(tr) NO2(-) WBC 0-5  Patient renal function has improved.  PK parameters:   Kel (hr-1): 0.073 Half-life (hrs): 9.50 Vd (liters): 49.77 (factor used: 0.7 L/kg)  Goal of Therapy:  Vancomycin trough level 15-20 mcg/ml  Plan:  Will change to Vancomycin 1 g IV q12 hours starting @ 10:00. Will order Vancomycin trough level prior to the 2200 dose on 8/14.   Larene Beach, PharmD 08/14/2014

## 2014-08-14 NOTE — Progress Notes (Signed)
[place] at [place] NAME: [name]    MR#:  [id number]  DATE OF BIRTH:  04-13-1960  SUBJECTIVE:  CHIEF COMPLAINT:   Chief Complaint  Patient presents with  . Loss of Consciousness   SOB same and chronic. Chronic pain same. Afebrile today REVIEW OF SYSTEMS:    Review of Systems  Constitutional: Positive for malaise/fatigue. Negative for fever and chills.  HENT: Negative for sore throat.   Eyes: Negative for blurred vision, double vision and pain.  Respiratory: Positive for cough. Negative for hemoptysis, shortness of breath and wheezing.   Cardiovascular: Negative for chest pain, palpitations, orthopnea and leg swelling.  Gastrointestinal: Negative for heartburn, nausea, vomiting, abdominal pain, diarrhea and constipation.  Genitourinary: Negative for dysuria and hematuria.  Musculoskeletal: Positive for back pain, joint pain and neck pain.  Skin: Negative for rash.  Neurological: Positive for dizziness and weakness. Negative for sensory change, speech change, focal weakness and headaches.  Endo/Heme/Allergies: Does not bruise/bleed easily.  Psychiatric/Behavioral: Positive for depression. The patient is not nervous/anxious.       DRUG ALLERGIES:  No Known Allergies  VITALS:  Blood pressure 102/64, pulse 77, temperature 97.8 F (36.6 C), temperature source Oral, resp. rate 17, height '5\' 6"'$  (1.676 m), weight 71.079 kg (156 lb 11.2 oz), SpO2 96 %.  PHYSICAL EXAMINATION:   Physical Exam  GENERAL:  54 y.o.-year-old patient lying in the bed with no acute distress.  EYES: Pupils equal, round, reactive to light and accommodation. No scleral icterus. Extraocular muscles intact.  HEENT: Head atraumatic, normocephalic. Oropharynx and nasopharynx clear.  NECK:  Supple, no jugular venous distention. No thyroid enlargement, no tenderness.  LUNGS: Normal breath sounds bilaterally, no wheezing, rales, rhonchi. No use of  accessory muscles of respiration.  CARDIOVASCULAR: S1, S2 normal. No murmurs, rubs, or gallops.  ABDOMEN: Soft, nontender, nondistended. Bowel sounds present. No organomegaly or mass.  EXTREMITIES: No cyanosis, clubbing or edema b/l.    NEUROLOGIC: Cranial nerves II through XII are intact. No focal Motor or sensory deficits b/l.   PSYCHIATRIC: The patient is alert and oriented x 3.  SKIN: No obvious rash, lesion, or ulcer.    LABORATORY PANEL:   CBC  Recent Labs Lab 08/14/14 0438  WBC 6.8  HGB 11.2*  HCT 34.9*  PLT 164   ------------------------------------------------------------------------------------------------------------------  Chemistries   Recent Labs Lab 08/14/14 0438  NA 138  K 3.4*  CL 95*  CO2 33*  GLUCOSE 172*  BUN 20  CREATININE 0.75  CALCIUM 8.3*   ------------------------------------------------------------------------------------------------------------------  Cardiac Enzymes  Recent Labs Lab 08/12/14 2355  TROPONINI <0.03   ------------------------------------------------------------------------------------------------------------------  RADIOLOGY:  Dg Chest 2 View  08/13/2014   CLINICAL DATA:  Shortness of breath.  History of lung carcinoma  EXAM: CHEST  2 VIEW  COMPARISON:  June 22, 2014  FINDINGS: There is widespread reticulonodular interstitial disease throughout the lungs. There is no airspace consolidation. The heart size and pulmonary vascularity are normal. No adenopathy. Port-A-Cath tip is in the superior vena cava. No pneumothorax. No bone lesions.  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 Differential considerations include lymphangitic spread of tumor; atypical infection; and noncardiogenic pulmonary edema. No adenopathy appreciable. Heart size within normal limits.   Electronically Signed   By: Lowella Grip III M.D.   On: 08/13/2014 00:22   Ct Head Wo Contrast  08/13/2014   CLINICAL  DATA:  Patient found unresponsive. Diaphoresis. Initial encounter.  EXAM: CT HEAD WITHOUT  CONTRAST  TECHNIQUE: Contiguous axial images were obtained from the base of the skull through the vertex without intravenous contrast.  COMPARISON:  CT of the head performed 08/20/2013, and MRI of the brain performed 02/02/2014  FINDINGS: There is no evidence of acute infarction, mass lesion, or intra- or extra-axial hemorrhage on CT.  Mild subcortical white matter change likely reflects small vessel ischemic microangiopathy.  The posterior fossa, including the cerebellum, brainstem and fourth ventricle, is within normal limits. The third and lateral ventricles, and basal ganglia are unremarkable in appearance. The cerebral hemispheres are symmetric in appearance, with normal gray-white differentiation. No mass effect or midline shift is seen.  There is no evidence of fracture; visualized osseous structures are unremarkable in appearance. The visualized portions of the orbits are within normal limits. The paranasal sinuses and mastoid air cells are well-aerated. No significant soft tissue abnormalities are seen.  IMPRESSION: 1. No acute intracranial pathology seen on CT. 2. Mild small vessel ischemic microangiopathy.   Electronically Signed   By: Garald Balding M.D.   On: 08/13/2014 00:37     ASSESSMENT AND PLAN:   This is a 54 year old Caucasian female admitted female admitted for sepsis and syncope secondary to dehydration.  * Sepsis due to [place] bacteremia Source unknown. Port? Repeat Bcx with 1 from port. Start vancomycin. Consult ID. May need echo depending on ID and Sen.  * Syncope due to hypotension  * Hypokalemia: Potassium repleted and now normal  * Adenocarcinoma of the lung: Stage IV; the patient has declined further chemotherapy. She is on hospice care at home.  * Chronic pain syndrome On fentanyl patch and PRN pain medications  * Chronic respiratory failure Stable   All the records are reviewed and case  discussed with Care Management/Social Workerr. Management plans discussed with the patient, family and they are in agreement.  DVT Prophylaxis: SCDs  TOTAL TIME TAKING CARE OF THIS PATIENT: 40 minutes.   POSSIBLE D/C IN 2-3 DAYS, DEPENDING ON CLINICAL CONDITION.   Hillary Bow R M.D on 08/14/2014 at 10:21 AM  Between 7am to 6pm - Pager - [phone]  After 6pm go to [email or web address] - password [email or web address] [facility]  Office  [phone]  CC: Primary care physician; Pcp Not In System

## 2014-08-15 LAB — GLUCOSE, CAPILLARY
Glucose-Capillary: 148 mg/dL — ABNORMAL HIGH (ref 65–99)
Glucose-Capillary: 223 mg/dL — ABNORMAL HIGH (ref 65–99)
Glucose-Capillary: 214 mg/dL — ABNORMAL HIGH (ref 65–99)
Glucose-Capillary: 176 mg/dL — ABNORMAL HIGH (ref 65–99)

## 2014-08-15 LAB — CULTURE, BLOOD (ROUTINE X 2)

## 2014-08-15 LAB — VANCOMYCIN, TROUGH: VANCOMYCIN TR: 18 ug/mL (ref 10–20)

## 2014-08-15 MED ORDER — SODIUM CHLORIDE 0.9 % IJ SOLN
INTRAMUSCULAR | Status: AC
  Administered 2014-08-15: 12:00:00
  Filled 2014-08-15: qty 20

## 2014-08-15 NOTE — Progress Notes (Signed)
Patient demanding IV Dilaudid be administered to her every four hours, on the hour, exactly at the due time. Patient stated she had the same issue with me last night and with the day time nurse today. Speaking for myself, the patient has received PRN pain medication upon each request, within 30 minutes of her request. Taking into consideration unpredictable circumstances that have occurred with one of my other five patients and/or charge nurse responsibilities. I explained to Ms. Gonia that I would do my very best to be at her bedside with her requested medications (Dilaudid and Phenergan) as quick as I could, given that I wasn't with another patient, assisting a fellow nurse or addressing issues that may arise on the nursing unit. Upon her second calling the nurses station, although my [place] number was on her assigned room information board (which she did not call), I was in the medication room pulling her medications from the [facility]. After, collecting patient medications, I went directly to her room. Patient was sitting on the SOB, clearly agitated, face red and on her cell phone. She expressed her frustrations to me. After receptively listening to the patient's complaints, I explained that I was not intentionally holding per pain medications from her. I was assisting another nurse with family/ customer service issue. I reassured her that I would do my very best to be prompt with her PRN pain medications, as I possibly could. Patient was quick to return with harsh, verbal frustration. At that time, I explained that I would be happy to notify the nursing supervisor of the situation, showed her where the numbers were for complaints and explained that the unit director would perform patient rounding tomorrow and she was welcome to express her concerns at that time. I continued to complete my assessment, lab draw and other medication administration while in the room, with patient's consent to all tasks performed. Will  ask a fellow nurse to switch assignment to avoid further confrontations between myself and the patient.

## 2014-08-15 NOTE — Progress Notes (Signed)
[facility] at [place] NAME: [name]    MR#:  [id number]  DATE OF BIRTH:  [birth year]  SUBJECTIVE:  CHIEF COMPLAINT:   Chief Complaint  Patient presents with  . Loss of Consciousness   Chronic SOB and pain are same  REVIEW OF SYSTEMS:    Review of Systems  Constitutional: Positive for malaise/fatigue. Negative for fever and chills.  HENT: Negative for sore throat.   Eyes: Negative for blurred vision, double vision and pain.  Respiratory: Positive for cough. Negative for hemoptysis, shortness of breath and wheezing.   Cardiovascular: Negative for chest pain, palpitations, orthopnea and leg swelling.  Gastrointestinal: Negative for heartburn, nausea, vomiting, abdominal pain, diarrhea and constipation.  Genitourinary: Negative for dysuria and hematuria.  Musculoskeletal: Positive for back pain, joint pain and neck pain.  Skin: Negative for rash.  Neurological: Positive for dizziness and weakness. Negative for sensory change, speech change, focal weakness and headaches.  Endo/Heme/Allergies: Does not bruise/bleed easily.  Psychiatric/Behavioral: Positive for depression. The patient is not nervous/anxious.       DRUG ALLERGIES:  No Known Allergies  VITALS:  Blood pressure 118/77, pulse 103, temperature 98.3 F (36.8 C), temperature source Oral, resp. rate 16, height '5\' 6"'$  (1.676 m), weight 71.804 kg (158 lb 4.8 oz), SpO2 98 %.  PHYSICAL EXAMINATION:   Physical Exam  GENERAL:  54 y.o.-year-old patient lying in the bed with no acute distress.  EYES: Pupils equal, round, reactive to light and accommodation. No scleral icterus. Extraocular muscles intact.  HEENT: Head atraumatic, normocephalic. Oropharynx and nasopharynx clear.  NECK:  Supple, no jugular venous distention. No thyroid enlargement, no tenderness.  LUNGS: Normal breath sounds bilaterally, no wheezing, rales, rhonchi. No use of accessory muscles of respiration.   CARDIOVASCULAR: S1, S2 normal. No murmurs, rubs, or gallops.  ABDOMEN: Soft, nontender, nondistended. Bowel sounds present. No organomegaly or mass.  EXTREMITIES: No cyanosis, clubbing or edema b/l.    NEUROLOGIC: Cranial nerves II through XII are intact. No focal Motor or sensory deficits b/l.   PSYCHIATRIC: The patient is alert and oriented x 3.  SKIN: No obvious rash, lesion, or ulcer.    LABORATORY PANEL:   CBC  Recent Labs Lab 08/14/14 0438  WBC 6.8  HGB 11.2*  HCT 34.9*  PLT 164   ------------------------------------------------------------------------------------------------------------------  Chemistries   Recent Labs Lab 08/14/14 0438  NA 138  K 3.4*  CL 95*  CO2 33*  GLUCOSE 172*  BUN 20  CREATININE 0.75  CALCIUM 8.3*   ------------------------------------------------------------------------------------------------------------------  Cardiac Enzymes  Recent Labs Lab 08/12/14 2355  TROPONINI <0.03   ------------------------------------------------------------------------------------------------------------------  RADIOLOGY:  No results found.   ASSESSMENT AND PLAN:   This is a 54 year old Caucasian female admitted for sepsis and syncope secondary to dehydration.  * Sepsis due to St[place] epidermidis bacteremia Source unknown. Port? Repeat Bcx with 1 from port - NGTD Start vancomycin. Consult ID. Will await input.  * Syncope due to hypotension  * Hypokalemia: Potassium repleted and now normal  * Adenocarcinoma of the lung: Stage IV; the patient has declined further chemotherapy. She is on hospice care at home.  * Chronic pain syndrome On fentanyl patch and PRN pain medications  * Chronic respiratory failure Stable   All the records are reviewed and case discussed with Care Management/Social Workerr. Management plans discussed with the patient, family and they are in agreement.  DVT Prophylaxis: SCDs  TOTAL TIME TAKING CARE OF THIS  PATIENT: 40 minutes.  POSSIBLE D/C IN 2-3 DAYS, DEPENDING ON CLINICAL CONDITION.   Hillary Bow R M.D on 08/15/2014 at 12:03 PM  Between 7am to 6pm - Pager - [phone]  After 6pm go to [email or web address] - password [email or web address] [facility]  Office  [phone]  CC: Primary care physician; Pcp Not In System

## 2014-08-15 NOTE — Progress Notes (Signed)
ANTIBIOTIC CONSULT NOTE - FOLLOW UP   Pharmacy Consult for vancomycin  Indication: sepsis  No Known Allergies  Patient Measurements: Height: '5\' 6"'$  (167.6 cm) Weight: 158 lb 4.8 oz (71.804 kg) IBW/kg (Calculated) : 59.3 Adjusted Body Weight: 70.3kg  Vital Signs: Temp: 97.6 F (36.4 C) (08/14 2008) Temp Source: Oral (08/14 2008) BP: 128/77 mmHg (08/14 2008) Pulse Rate: 101 (08/14 2008) Intake/Output from previous day: 08/13 0701 - 08/14 0700 In: 840 [P.O.:840] Out: 2550 [Urine:2550] Intake/Output from this shift:    Labs:  Recent Labs  08/12/14 2355 08/14/14 0438  WBC 17.4* 6.8  HGB 12.9 11.2*  PLT 208 164  CREATININE 1.32* 0.75   Estimated Creatinine Clearance: 84.4 mL/min (by C-G formula based on Cr of 0.75).  Recent Labs  08/15/14 2130  [facility] 22     Microbiology: Recent Results (from the past 720 hour(s))  Culture, blood (routine x 2)     Status: None   Collection Time: 08/13/14  3:17 AM  Result Value Ref Range Status   Specimen Description PORTA CATH  Final   Special Requests BOTTLES DRAWN AEROBIC AND ANAEROBIC 5ML  Final   Culture  Setup Time   Final    GRAM POSITIVE COCCI IN BOTH AEROBIC AND ANAEROBIC BOTTLES CRITICAL RESULT CALLED TO, READ BACK BY AND VERIFIED WITH: MARCEL TURNER AT 2040 08/13/14.PMH CONFIRMED BY HS    Culture   Final    STAPHYLOCOCCUS EPIDERMIDIS IN BOTH AEROBIC AND ANAEROBIC BOTTLES    Report Status 08/15/2014 FINAL  Final   Organism ID, Bacteria STAPHYLOCOCCUS EPIDERMIDIS  Final      Susceptibility   Staphylococcus epidermidis - MIC*    CIPROFLOXACIN >=8 RESISTANT Resistant     ERYTHROMYCIN >=8 RESISTANT Resistant     GENTAMICIN <=0.5 SENSITIVE Sensitive     OXACILLIN Value in next row Resistant      >=4 RESISTANTWARNING: For oxacillin-resistant S.aureus and coagulase-negative staphylococci (MRS), other beta-lactam agents, ie, penicillins, beta-lactam/beta-lactamase inhibitor combinations, cephems (with the exception  of the cephalosporins with anti-MRSA activity), and carbapenems, may appear active in vitro, but are not effective clinically.  --CLSI, Vol.32 No.3, January 2012, pg 70.    TETRACYCLINE Value in next row Sensitive      >=4 RESISTANTWARNING: For oxacillin-resistant S.aureus and coagulase-negative staphylococci (MRS), other beta-lactam agents, ie, penicillins, beta-lactam/beta-lactamase inhibitor combinations, cephems (with the exception of the cephalosporins with anti-MRSA activity), and carbapenems, may appear active in vitro, but are not effective clinically.  --CLSI, Vol.32 No.3, January 2012, pg 70.    VANCOMYCIN Value in next row Sensitive      >=4 RESISTANTWARNING: For oxacillin-resistant S.aureus and coagulase-negative staphylococci (MRS), other beta-lactam agents, ie, penicillins, beta-lactam/beta-lactamase inhibitor combinations, cephems (with the exception of the cephalosporins with anti-MRSA activity), and carbapenems, may appear active in vitro, but are not effective clinically.  --CLSI, Vol.32 No.3, January 2012, pg 70.    CLINDAMYCIN Value in next row Resistant      >=4 RESISTANTWARNING: For oxacillin-resistant S.aureus and coagulase-negative staphylococci (MRS), other beta-lactam agents, ie, penicillins, beta-lactam/beta-lactamase inhibitor combinations, cephems (with the exception of the cephalosporins with anti-MRSA activity), and carbapenems, may appear active in vitro, but are not effective clinically.  --CLSI, Vol.32 No.3, January 2012, pg 70.    * STAPHYLOCOCCUS EPIDERMIDIS  Culture, blood (routine x 2)     Status: None   Collection Time: 08/13/14  3:17 AM  Result Value Ref Range Status   Specimen Description PORTA CATH  Final   Special Requests BOTTLES DRAWN AEROBIC AND  ANAEROBIC 6ML  Final   Culture  Setup Time   Final    GRAM POSITIVE COCCI IN BOTH AEROBIC AND ANAEROBIC BOTTLES CRITICAL RESULT CALLED TO, READ BACK BY AND VERIFIED WITH: MARCEL TURNER AT 2040  08/13/14.PMH CONFIRMED BY HS    Culture   Final    STAPHYLOCOCCUS EPIDERMIDIS IN BOTH AEROBIC AND ANAEROBIC BOTTLES    Report Status 08/15/2014 FINAL  Final   Organism ID, Bacteria STAPHYLOCOCCUS EPIDERMIDIS  Final      Susceptibility   Staphylococcus epidermidis - MIC*    CIPROFLOXACIN >=8 RESISTANT Resistant     ERYTHROMYCIN >=8 RESISTANT Resistant     GENTAMICIN <=0.5 SENSITIVE Sensitive     OXACILLIN Value in next row Resistant      >=4 RESISTANTWARNING: For oxacillin-resistant S.aureus and coagulase-negative staphylococci (MRS), other beta-lactam agents, ie, penicillins, beta-lactam/beta-lactamase inhibitor combinations, cephems (with the exception of the cephalosporins with anti-MRSA activity), and carbapenems, may appear active in vitro, but are not effective clinically.  --CLSI, Vol.32 No.3, January 2012, pg 70.    TETRACYCLINE Value in next row Sensitive      >=4 RESISTANTWARNING: For oxacillin-resistant S.aureus and coagulase-negative staphylococci (MRS), other beta-lactam agents, ie, penicillins, beta-lactam/beta-lactamase inhibitor combinations, cephems (with the exception of the cephalosporins with anti-MRSA activity), and carbapenems, may appear active in vitro, but are not effective clinically.  --CLSI, Vol.32 No.3, January 2012, pg 70.    VANCOMYCIN Value in next row Sensitive      >=4 RESISTANTWARNING: For oxacillin-resistant S.aureus and coagulase-negative staphylococci (MRS), other beta-lactam agents, ie, penicillins, beta-lactam/beta-lactamase inhibitor combinations, cephems (with the exception of the cephalosporins with anti-MRSA activity), and carbapenems, may appear active in vitro, but are not effective clinically.  --CLSI, Vol.32 No.3, January 2012, pg 70.    CLINDAMYCIN Value in next row Resistant      >=4 RESISTANTWARNING: For oxacillin-resistant S.aureus and coagulase-negative staphylococci (MRS), other beta-lactam agents, ie, penicillins, beta-lactam/beta-lactamase  inhibitor combinations, cephems (with the exception of the cephalosporins with anti-MRSA activity), and carbapenems, may appear active in vitro, but are not effective clinically.  --CLSI, Vol.32 No.3, January 2012, pg 70.    * STAPHYLOCOCCUS EPIDERMIDIS  Culture, blood (routine x 2)     Status: None (Preliminary result)   Collection Time: 08/14/14  7:46 AM  Result Value Ref Range Status   Specimen Description BLOOD RIGHT HAND  Final   Special Requests   Final    BOTTLES DRAWN AEROBIC AND ANAEROBIC  AER 2CC ANA 4CC   Culture NO GROWTH < 24 HOURS  Final   Report Status PENDING  Incomplete  Culture, blood (routine x 2)     Status: None (Preliminary result)   Collection Time: 08/14/14  8:49 AM  Result Value Ref Range Status   Specimen Description BLOOD RIGHT PORTA CATH  Final   Special Requests BOTTLES DRAWN AEROBIC AND ANAEROBIC  4CC  Final   Culture NO GROWTH < 24 HOURS  Final   Report Status PENDING  Incomplete    Medical History: Past Medical History  Diagnosis Date  . Pneumonia   . Lung cancer   . DVT (deep venous thrombosis)   . Urinary incontinence   . HTN (hypertension)   . Chronic diastolic congestive heart failure     Medications:   Assessment: Blood cx pending UA: LE(tr) NO2(-) WBC 0-5  Patient renal function has improved.  PK parameters:   Kel (hr-1): 0.073 Half-life (hrs): 9.50 Vd (liters): 49.77 (factor used: 0.7 L/kg)  Goal of Therapy:  Vancomycin trough  level 15-20 mcg/ml  Plan:  Will change to Vancomycin 1 g IV q12 hours starting @ 10:00. Will order Vancomycin trough level prior to the 2200 dose on 8/14.   8/14 PM vanc trough 18. Continue current regimen.  Sim Boast, PharmD, BCPS  08/15/2014

## 2014-08-16 LAB — GLUCOSE, CAPILLARY
Glucose-Capillary: 243 mg/dL — ABNORMAL HIGH (ref 65–99)
Glucose-Capillary: 137 mg/dL — ABNORMAL HIGH (ref 65–99)
Glucose-Capillary: 198 mg/dL — ABNORMAL HIGH (ref 65–99)
Glucose-Capillary: 190 mg/dL — ABNORMAL HIGH (ref 65–99)

## 2014-08-16 NOTE — Plan of Care (Signed)
Problem: Discharge Progression Outcomes Goal: Other Discharge Outcomes/Goals Plan of care progress to goal; - A transfer from 2a. - Pain control via PRN pain med. - Ambulates in room, on high fall precautions, refuses bed alarm. - Nausea controlled by PRN  IV phenergan. Will continue to monitor.

## 2014-08-16 NOTE — Progress Notes (Signed)
Infectious Disease Long Term IV Antibiotic Orders  Diagnosis Culture results CNS blood cultures  Allergies: No Known Allergies  Discharge antibiotics  Vancomycin         1000 mg  every      12     hours .     Goal vancomycin trough 15-20.    Pharmacy to adjust dosing based on levels  Portacath  Care per protocol Labs weekly while on IV antibiotics (circle)      CBC w diff   Comprehensive met panel Vancomycin Trough    DRAW BCX 2 days after stopping antibiotics  Stop date 8/27 Follow up clinic dateTBD - at 2 weeks - at end of treatment  FAX weekly labs to [phone]  Leonel Ramsay, MD

## 2014-08-16 NOTE — Progress Notes (Signed)
[facility] [place] NAME: [name]    MR#:  [id number]  DATE OF BIRTH:  1960/09/08  SUBJECTIVE:  CHIEF COMPLAINT:   Chief Complaint  Patient presents with  . Loss of Consciousness   Chronic SOB and pain are same . No acute changes.  REVIEW OF SYSTEMS:    Review of Systems  Constitutional: Positive for malaise/fatigue. Negative for fever and chills.  HENT: Negative for sore throat.   Eyes: Negative for blurred vision, double vision and pain.  Respiratory: Positive for cough. Negative for hemoptysis, shortness of breath and wheezing.   Cardiovascular: Negative for chest pain, palpitations, orthopnea and leg swelling.  Gastrointestinal: Negative for heartburn, nausea, vomiting, abdominal pain, diarrhea and constipation.  Genitourinary: Negative for dysuria and hematuria.  Musculoskeletal: Positive for back pain, joint pain and neck pain.  Skin: Negative for rash.  Neurological: Positive for dizziness and weakness. Negative for sensory change, speech change, focal weakness and headaches.  Endo/Heme/Allergies: Does not bruise/bleed easily.  Psychiatric/Behavioral: Positive for depression. The patient is not nervous/anxious.       DRUG ALLERGIES:  No Known Allergies  VITALS:  Blood pressure 127/93, pulse 88, temperature 98.2 F (36.8 C), temperature source Oral, resp. rate 22, height '5\' 6"'$  (1.676 m), weight 71.623 kg (157 lb 14.4 oz), SpO2 97 %.  PHYSICAL EXAMINATION:   Physical Exam  GENERAL:  54 y.o.-year-old patient lying in the bed with no acute distress.  EYES: Pupils equal, round, reactive to light and accommodation. No scleral icterus. Extraocular muscles intact.  HEENT: Head atraumatic, normocephalic. Oropharynx and nasopharynx clear.  NECK:  Supple, no jugular venous distention. No thyroid enlargement, no tenderness.  LUNGS: Normal breath sounds bilaterally, no wheezing, rales, rhonchi. No use of accessory  muscles of respiration.  CARDIOVASCULAR: S1, S2 normal. No murmurs, rubs, or gallops.  ABDOMEN: Soft, nontender, nondistended. Bowel sounds present. No organomegaly or mass.  EXTREMITIES: No cyanosis, clubbing or edema b/l.    NEUROLOGIC: Cranial nerves II through XII are intact. No focal Motor or sensory deficits b/l.   PSYCHIATRIC: The patient is alert and oriented x 3.  SKIN: No obvious rash, lesion, or ulcer.  Port-a-cath site clean  LABORATORY PANEL:   CBC  Recent Labs Lab 08/14/14 0438  WBC 6.8  HGB 11.2*  HCT 34.9*  PLT 164   ------------------------------------------------------------------------------------------------------------------  Chemistries   Recent Labs Lab 08/14/14 0438  NA 138  K 3.4*  CL 95*  CO2 33*  GLUCOSE 172*  BUN 20  CREATININE 0.75  CALCIUM 8.3*   ------------------------------------------------------------------------------------------------------------------  Cardiac Enzymes  Recent Labs Lab 08/12/14 2355  TROPONINI <0.03   ------------------------------------------------------------------------------------------------------------------  RADIOLOGY:  No results found.   ASSESSMENT AND PLAN:   This is a 54 year old Caucasian female admitted for sepsis and syncope secondary to dehydration.  * Sepsis due to Staph epidermidis bacteremia Source unknown. Port? Repeat Bcx with 1 from port - NGTD Start vancomycin. Discussed with Dr. Ola Spurr. Will d/c on IV vancomycin. Repeat bcx still pending but NGTD.  * Syncope due to hypotension  * Hypokalemia: Potassium repleted and now normal  * Adenocarcinoma of the lung: Stage IV; the patient has declined further chemotherapy. She is on hospice care at home.  * Chronic pain syndrome On fentanyl patch and PRN pain medications  * Chronic respiratory failure Stable   All the records are reviewed and case discussed with Care Management/Social Workerr. Management plans discussed  with the patient, family and they are  in agreement.  DVT Prophylaxis: SCDs  TOTAL TIME TAKING CARE OF THIS PATIENT: 40 minutes.   D/C tomorrow   Hillary Bow R M.D on 08/16/2014 at 3:36 PM  Between 7am to 6pm - Pager - [phone]  After 6pm go to [email or web address] - password [email or web address] [facility]  Office  [phone]  CC: Primary care physician; Pcp Not In System

## 2014-08-16 NOTE — Progress Notes (Signed)
Nursing Supervisor came to follow-up/ service recovery with patient. However, per nursing supervisor, she knocked on patient's room door and walked around the bed with the patient never waking up. Patient breathing, head laid back against pillow, eyes closed and mouth hanging open (appeared to be sleeping well). Will continue two hour rounding on patient and when awake, will notify nursing supervisor.

## 2014-08-16 NOTE — Consult Note (Signed)
[facility] Infectious Disease     Reason for Consult:Bacteremia    Referring Physician: Sudini Date of Admission:  08/12/2014   Active Problems:   Sepsis   HPI: [name] is a 54 y.o. female with stage 4 lung cancer admitted with syncope and sepsis, 8.11. At that time had temp 100.2 and WBC 17.4.  She was treated with IV abx and fluids. [place] 2/2 + for coag neg staph.  Clinically she is much imrpoved No port tenderness, no cp, fevers, ns  Past Medical History  Diagnosis Date  . Pneumonia   . Lung cancer   . DVT (deep venous thrombosis)   . Urinary incontinence   . HTN (hypertension)   . Chronic diastolic congestive heart failure    Past Surgical History  Procedure Laterality Date  . Video bronchoscopy Bilateral 01/14/2013    Procedure: VIDEO BRONCHOSCOPY WITHOUT FLUORO;  Surgeon: Wilhelmina Mcardle, MD;  Location: [facility] ENDOSCOPY;  Service: Cardiopulmonary;  Laterality: Bilateral;  . Video bronchoscopy N/A 01/27/2013    Procedure: VIDEO BRONCHOSCOPY;  Surgeon: Ivin Poot, MD;  Location: [place];  Service: Thoracic;  Laterality: N/A;  . Video assisted thoracoscopy Right 01/27/2013    Procedure: VIDEO ASSISTED THORACOSCOPY;  Surgeon: Ivin Poot, MD;  Location: [place];  Service: Thoracic;  Laterality: Right;  . Lung biopsy Right 01/27/2013    Procedure: LUNG BIOPSY;  Surgeon: Ivin Poot, MD;  Location: [place];  Service: Thoracic;  Laterality: Right;  . Pericardial tap N/A 01/11/2013    Procedure: PERICARDIAL TAP;  Surgeon: Blane Ohara, MD;  Location: [facility] CATH LAB;  Service: Cardiovascular;  Laterality: N/A;  . Bladder surgery     Social History  Substance Use Topics  . Smoking status: Former Research scientist (life sciences)  . Smokeless tobacco: Never Used  . Alcohol Use: Yes     Comment: occasionally has a drink   Family History  Problem Relation Age of Onset  . Coronary artery disease Mother 43  . Lung cancer Mother   . Prostate cancer Father   . Breast cancer Mother   . Heart  attack Mother     Allergies: No Known Allergies  Current antibiotics: Antibiotics Given (last 72 hours)    Date/Time Action Medication Dose Rate   08/13/14 2154 Given   piperacillin-tazobactam (ZOSYN) IVPB 3.375 g 3.375 g 12.5 mL/hr   08/14/14 0443 Given   piperacillin-tazobactam (ZOSYN) IVPB 3.375 g 3.375 g 12.5 mL/hr   08/14/14 1110 Given   vancomycin (VANCOCIN) IVPB 1000 mg/200 mL premix 1,000 mg 200 mL/hr   08/14/14 2205 Given   vancomycin (VANCOCIN) IVPB 1000 mg/200 mL premix 1,000 mg 200 mL/hr   08/15/14 1045 Given   vancomycin (VANCOCIN) IVPB 1000 mg/200 mL premix 1,000 mg 200 mL/hr   08/15/14 2050 Given   vancomycin (VANCOCIN) IVPB 1000 mg/200 mL premix 1,000 mg 200 mL/hr   08/16/14 0926 Given   vancomycin (VANCOCIN) IVPB 1000 mg/200 mL premix 1,000 mg 200 mL/hr      MEDICATIONS: . apixaban  5 mg Oral BID  . conjugated estrogens  1 Applicatorful Vaginal Once per day on Mon Wed Fri  . dexamethasone  4 mg Oral BID  . docusate sodium  100 mg Oral BID  . dronabinol  5 mg Oral BID  . fentaNYL  200 mcg Transdermal Q72H  . furosemide  40 mg Oral BID  . gabapentin  300 mg Oral TID  . insulin aspart  0-5 Units Subcutaneous QHS  . insulin  aspart  0-9 Units Subcutaneous TID WC  . lidocaine  1 patch Transdermal Daily  . metoprolol tartrate  12.5 mg Oral BID  . pantoprazole  40 mg Oral Daily  . potassium chloride SA  20 mEq Oral BID  . scopolamine  1 patch Transdermal Q72H  . vancomycin  1,000 mg Intravenous Q12H    Review of Systems - 11 systems reviewed and negative per HPI   OBJECTIVE: Temp:  [97.6 F (36.4 C)-98.2 F (36.8 C)] 98.2 F (36.8 C) (08/15 0759) Pulse Rate:  [83-101] 88 (08/15 0759) Resp:  [20-22] 22 (08/15 0759) BP: (104-128)/(77-93) 127/93 mmHg (08/15 0759) SpO2:  [96 %-97 %] 97 % (08/15 0759) Weight:  [71.623 kg (157 lb 14.4 oz)] 71.623 kg (157 lb 14.4 oz) (08/15 0500) Physical Exam  Constitutional:  oriented to person, place, and time. appears  well-developed and well-nourished. No distress. On O2,2L HENT: X[place]/AT, PERRLA, no scleral icterus Mouth/Throat: Oropharynx is clear and moist. No oropharyngeal exudate.  Cardiovascular: Normal rate, regular rhythm and normal heart sounds. Exam reveals no gallop and no friction rub.  No murmur heard.  Portacath R chest wall wnl Pulmonary/Chest: Effort normal and breath sounds normal. No respiratory distress.  has no wheezes.  Neck = supple, no nuchal rigidity Abdominal: Soft. Bowel sounds are normal.  exhibits no distension. There is no tenderness.  Lymphadenopathy: no cervical adenopathy. No axillary adenopathy Neurological: alert and oriented to person, place, and time.  Skin: Skin is warm and dry. No rash noted. No erythema.  Psychiatric: a normal mood and affect.  behavior is normal.    LABS: Results for orders placed or performed during the hospital encounter of 08/12/14 (from the past 48 hour(s))  Glucose, capillary     Status: Abnormal   Collection Time: 08/14/14  4:30 PM  Result Value Ref Range   Glucose-Capillary 290 (H) 65 - 99 mg/dL  Glucose, capillary     Status: Abnormal   Collection Time: 08/15/14  7:47 AM  Result Value Ref Range   Glucose-Capillary 148 (H) 65 - 99 mg/dL  Glucose, capillary     Status: Abnormal   Collection Time: 08/15/14 11:15 AM  Result Value Ref Range   Glucose-Capillary 223 (H) 65 - 99 mg/dL  Glucose, capillary     Status: Abnormal   Collection Time: 08/15/14  4:11 PM  Result Value Ref Range   Glucose-Capillary 214 (H) 65 - 99 mg/dL  Glucose, capillary     Status: Abnormal   Collection Time: 08/15/14  8:33 PM  Result Value Ref Range   Glucose-Capillary 176 (H) 65 - 99 mg/dL  Vancomycin, trough     Status: None   Collection Time: 08/15/14  9:30 PM  Result Value Ref Range   Vancomycin Tr 18 10 - 20 ug/mL  Glucose, capillary     Status: Abnormal   Collection Time: 08/16/14  9:11 AM  Result Value Ref Range   Glucose-Capillary 243 (H) 65 - 99  mg/dL  Glucose, capillary     Status: Abnormal   Collection Time: 08/16/14 11:35 AM  Result Value Ref Range   Glucose-Capillary 137 (H) 65 - 99 mg/dL   No components found for: ESR, C REACTIVE PROTEIN MICRO: Recent Results (from the past 720 hour(s))  Culture, blood (routine x 2)     Status: None   Collection Time: 08/13/14  3:17 AM  Result Value Ref Range Status   Specimen Description PORTA CATH  Final   Special Requests BOTTLES DRAWN AEROBIC AND  ANAEROBIC 5ML  Final   Culture  Setup Time   Final    GRAM POSITIVE COCCI IN BOTH AEROBIC AND ANAEROBIC BOTTLES CRITICAL RESULT CALLED TO, READ BACK BY AND VERIFIED WITH: MARCEL TURNER AT 2040 08/13/14.PMH CONFIRMED BY HS    Culture   Final    STAPHYLOCOCCUS EPIDERMIDIS IN BOTH AEROBIC AND ANAEROBIC BOTTLES    Report Status 08/15/2014 FINAL  Final   Organism ID, Bacteria STAPHYLOCOCCUS EPIDERMIDIS  Final      Susceptibility   Staphylococcus epidermidis - MIC*    CIPROFLOXACIN >=8 RESISTANT Resistant     ERYTHROMYCIN >=8 RESISTANT Resistant     GENTAMICIN <=0.5 SENSITIVE Sensitive     OXACILLIN Value in next row Resistant      >=4 RESISTANTWARNING: For oxacillin-resistant S.aureus and coagulase-negative staphylococci (MRS), other beta-lactam agents, ie, penicillins, beta-lactam/beta-lactamase inhibitor combinations, cephems (with the exception of the cephalosporins with anti-MRSA activity), and carbapenems, may appear active in vitro, but are not effective clinically.  --CLSI, Vol.32 No.3, January 2012, pg 70.    TETRACYCLINE Value in next row Sensitive      >=4 RESISTANTWARNING: For oxacillin-resistant S.aureus and coagulase-negative staphylococci (MRS), other beta-lactam agents, ie, penicillins, beta-lactam/beta-lactamase inhibitor combinations, cephems (with the exception of the cephalosporins with anti-MRSA activity), and carbapenems, may appear active in vitro, but are not effective clinically.  --CLSI, Vol.32 No.3, January 2012, pg 70.     VANCOMYCIN Value in next row Sensitive      >=4 RESISTANTWARNING: For oxacillin-resistant S.aureus and coagulase-negative staphylococci (MRS), other beta-lactam agents, ie, penicillins, beta-lactam/beta-lactamase inhibitor combinations, cephems (with the exception of the cephalosporins with anti-MRSA activity), and carbapenems, may appear active in vitro, but are not effective clinically.  --CLSI, Vol.32 No.3, January 2012, pg 70.    CLINDAMYCIN Value in next row Resistant      >=4 RESISTANTWARNING: For oxacillin-resistant S.aureus and coagulase-negative staphylococci (MRS), other beta-lactam agents, ie, penicillins, beta-lactam/beta-lactamase inhibitor combinations, cephems (with the exception of the cephalosporins with anti-MRSA activity), and carbapenems, may appear active in vitro, but are not effective clinically.  --CLSI, Vol.32 No.3, January 2012, pg 70.    * STAPHYLOCOCCUS EPIDERMIDIS  Culture, blood (routine x 2)     Status: None   Collection Time: 08/13/14  3:17 AM  Result Value Ref Range Status   Specimen Description PORTA CATH  Final   Special Requests BOTTLES DRAWN AEROBIC AND ANAEROBIC 6ML  Final   Culture  Setup Time   Final    GRAM POSITIVE COCCI IN BOTH AEROBIC AND ANAEROBIC BOTTLES CRITICAL RESULT CALLED TO, READ BACK BY AND VERIFIED WITH: MARCEL TURNER AT 2040 08/13/14.PMH CONFIRMED BY HS    Culture   Final    STAPHYLOCOCCUS EPIDERMIDIS IN BOTH AEROBIC AND ANAEROBIC BOTTLES    Report Status 08/15/2014 FINAL  Final   Organism ID, Bacteria STAPHYLOCOCCUS EPIDERMIDIS  Final      Susceptibility   Staphylococcus epidermidis - MIC*    CIPROFLOXACIN >=8 RESISTANT Resistant     ERYTHROMYCIN >=8 RESISTANT Resistant     GENTAMICIN <=0.5 SENSITIVE Sensitive     OXACILLIN Value in next row Resistant      >=4 RESISTANTWARNING: For oxacillin-resistant S.aureus and coagulase-negative staphylococci (MRS), other beta-lactam agents, ie, penicillins, beta-lactam/beta-lactamase  inhibitor combinations, cephems (with the exception of the cephalosporins with anti-MRSA activity), and carbapenems, may appear active in vitro, but are not effective clinically.  --CLSI, Vol.32 No.3, January 2012, pg 70.    TETRACYCLINE Value in next row Sensitive      >=4  RESISTANTWARNING: For oxacillin-resistant S.aureus and coagulase-negative staphylococci (MRS), other beta-lactam agents, ie, penicillins, beta-lactam/beta-lactamase inhibitor combinations, cephems (with the exception of the cephalosporins with anti-MRSA activity), and carbapenems, may appear active in vitro, but are not effective clinically.  --CLSI, Vol.32 No.3, January 2012, pg 70.    VANCOMYCIN Value in next row Sensitive      >=4 RESISTANTWARNING: For oxacillin-resistant S.aureus and coagulase-negative staphylococci (MRS), other beta-lactam agents, ie, penicillins, beta-lactam/beta-lactamase inhibitor combinations, cephems (with the exception of the cephalosporins with anti-MRSA activity), and carbapenems, may appear active in vitro, but are not effective clinically.  --CLSI, Vol.32 No.3, January 2012, pg 70.    CLINDAMYCIN Value in next row Resistant      >=4 RESISTANTWARNING: For oxacillin-resistant S.aureus and coagulase-negative staphylococci (MRS), other beta-lactam agents, ie, penicillins, beta-lactam/beta-lactamase inhibitor combinations, cephems (with the exception of the cephalosporins with anti-MRSA activity), and carbapenems, may appear active in vitro, but are not effective clinically.  --CLSI, Vol.32 No.3, January 2012, pg 70.    * STAPHYLOCOCCUS EPIDERMIDIS  Culture, blood (routine x 2)     Status: None (Preliminary result)   Collection Time: 08/14/14  7:46 AM  Result Value Ref Range Status   Specimen Description BLOOD RIGHT HAND  Final   Special Requests   Final    BOTTLES DRAWN AEROBIC AND ANAEROBIC  AER 2CC ANA 4CC   Culture NO GROWTH 2 DAYS  Final   Report Status PENDING  Incomplete  Culture, blood (routine x  2)     Status: None (Preliminary result)   Collection Time: 08/14/14  8:49 AM  Result Value Ref Range Status   Specimen Description BLOOD RIGHT PORTA CATH  Final   Special Requests BOTTLES DRAWN AEROBIC AND ANAEROBIC  4CC  Final   Culture NO GROWTH 2 DAYS  Final   Report Status PENDING  Incomplete    IMAGING: Dg Chest 2 View  08/13/2014   CLINICAL DATA:  Shortness of breath.  History of lung carcinoma  EXAM: CHEST  2 VIEW  COMPARISON:  June 22, 2014  FINDINGS: There is widespread reticulonodular interstitial disease throughout the lungs. There is no airspace consolidation. The heart size and pulmonary vascularity are normal. No adenopathy. Port-A-Cath tip is in the superior vena cava. No pneumothorax. No bone lesions.  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 Differential considerations include lymphangitic spread of tumor; atypical infection; and noncardiogenic pulmonary edema. No adenopathy appreciable. Heart size within normal limits.   Electronically Signed   By: Lowella Grip III M.D.   On: 08/13/2014 00:22   Ct Head Wo Contrast  08/13/2014   CLINICAL DATA:  Patient found unresponsive. Diaphoresis. Initial encounter.  EXAM: CT HEAD WITHOUT CONTRAST  TECHNIQUE: Contiguous axial images were obtained from the base of the skull through the vertex without intravenous contrast.  COMPARISON:  CT of the head performed 08/20/2013, and MRI of the brain performed 02/02/2014  FINDINGS: There is no evidence of acute infarction, mass lesion, or intra- or extra-axial hemorrhage on CT.  Mild subcortical white matter change likely reflects small vessel ischemic microangiopathy.  The posterior fossa, including the cerebellum, brainstem and fourth ventricle, is within normal limits. The third and lateral ventricles, and basal ganglia are unremarkable in appearance. The cerebral hemispheres are symmetric in appearance, with normal gray-white differentiation. No mass effect  or midline shift is seen.  There is no evidence of fracture; visualized osseous structures are unremarkable in appearance. The visualized portions of the orbits are within normal limits. The paranasal sinuses  and mastoid air cells are well-aerated. No significant soft tissue abnormalities are seen.  IMPRESSION: 1. No acute intracranial pathology seen on CT. 2. Mild small vessel ischemic microangiopathy.   Electronically Signed   By: Garald Balding M.D.   On: 08/13/2014 00:37    Assessment:   [name] is a 54 y.o. female with stage 4 lung cancer admitted with syncope and sepsis, 8.11. At that time had temp 100.2 and WBC 17.4.  She was treated with IV abx and fluids. [place] 2/2 + for coag neg staph.  Clinically she is much imrpoved No port tenderness, no cp, fevers, ns  I suspect she has Portacath infection with CNS however can try to preserve port given low virulence organism and clinically improving  Recommendations Vancomycin x 2 weeks - order sheet entered as progress note FU me in 2 weeks to ensure clearance of infection   Thank you very much for allowing me to participate in the care of this patient. Please call with questions.   Cheral Marker. Ola Spurr, MD

## 2014-08-16 NOTE — Plan of Care (Signed)
Problem: Discharge Progression Outcomes Goal: Barriers To Progression Addressed/Resolved Outcome: Progressing Plan of care with progress to goal Goal: Pain controlled with appropriate interventions Outcome: Not Progressing Pt cont to call for pain med and nausea med when due.  Rates it a 8/10 and it recedes to about a 5 or 6.  No vomitus noted. Up and about in room without diff  And independent. Goal: Other Discharge Outcomes/Goals Outcome: Progressing For possible discharge home  With iv abx per pt.  Pt with hospice services.

## 2014-08-16 NOTE — Progress Notes (Addendum)
Visit made. Patient seen sitting up in bed, alert and talkative. Relates she feels "better now that I am down here" referring to 1 C where she is familiar with staff. She denied pain, her fentanyl patch is at her home dose of 200 mcg. She is receiving  IV dilaudid 4 mg at her request instead of po, "because it works faster". She has required 12 mg in the last 10 hours, she voice her understanding that she will go home on po dilaudid as she was prior to this hospitalization. Per chart notes, she was unable to discharge to the Hospice home on Saturday 8/13 due to positive blood cultures, second cultures pending. She remains on IV vancomycin. Writer did readdress the plan for the Hospice Home for respite at discharge, patient was unsure if that was what she wants at this time. Will continue to discuss and update home care hospice team. No family present at time of visit. Will continue to follow through final disposition. Flo Shanks RN, BSN, [facility] [place], [facility] [phone] c

## 2014-08-17 LAB — GLUCOSE, CAPILLARY
Glucose-Capillary: 155 mg/dL — ABNORMAL HIGH (ref 65–99)
Glucose-Capillary: 178 mg/dL — ABNORMAL HIGH (ref 65–99)

## 2014-08-17 MED ORDER — VANCOMYCIN HCL IN DEXTROSE 1-5 GM/200ML-% IV SOLN: 1000.0000 mg | Freq: Two times a day (BID) | INTRAVENOUS | Status: AC

## 2014-08-17 NOTE — Discharge Summary (Signed)
[facility] NAME: [name]    MR#:  [id number]  DATE OF BIRTH:  May 28, 1960  DATE OF ADMISSION:  08/12/2014 ADMITTING PHYSICIAN: Harrie Foreman, MD  DATE OF DISCHARGE: 08/17/2014 11:10 AM  PRIMARY CARE PHYSICIAN: Pcp Not In System    ADMISSION DIAGNOSIS:  Hypokalemia [E87.6] Syncope [R55] Weakness [R53.1] Unresponsive episode [R40.4] Syncope, unspecified syncope type [R55]  DISCHARGE DIAGNOSIS:  Active Problems:   Sepsis   SECONDARY DIAGNOSIS:   Past Medical History  Diagnosis Date  . Pneumonia   . Lung cancer   . DVT (deep venous thrombosis)   . Urinary incontinence   . HTN (hypertension)   . Chronic diastolic congestive heart failure      ADMITTING HISTORY  Chief Complaint: Syncope HPI: The patient presents emergency department complaining of loss of consciousness. She denies lightheadedness or dizziness but admits to remembering the feeling of being excessively hot and fatigued. She states that the room which she was preparing for bed this evening is extremely hot and that she can recall sweat dripping from her nose prior to awakening in the back of the ambulance. She denies shortness of breath or chest pain. In the emergency department the patient appeared to be very somnolent but it was discovered that she had a 200 g fentanyl patch in place. The patch was removed and the patient promptly became more alert. Narcan was not given as IV access was difficult to establish prior to arrival. In the emergency department the patient was found to have cytosis, fever and tachycardia which prompted the emergency department to call for admission.   HOSPITAL COURSE:   This is a 54 year old Caucasian female admitted for sepsis and syncope secondary to dehydration.  * Sepsis due to Staph epidermidis bacteremia Source is likely her Port-A-Cath. Repeat Bcx with 1 from port - NGTD Started vancomycin in the hospital  and will be continued for 2 weeks as outpatient from her last negative blood culture. Discussed with Dr. Ola Spurr. Outpatient IV vancomycin has been set up.  * Syncope due to hypotension - no further episodes.  * Hypokalemia: Potassium repleted and now normal  * Adenocarcinoma of the lung: Stage IV; the patient has declined further chemotherapy. She is on hospice care at home.  * Chronic pain syndrome On fentanyl patch and PRN pain medications  * Chronic respiratory failure Stable  Patient will be discharged home on IV vancomycin along with hospice services following her.  CONSULTS OBTAINED:  Treatment Team:  Adrian Prows, MD  DRUG ALLERGIES:  No Known Allergies  DISCHARGE MEDICATIONS:   Discharge Medication List as of 08/17/2014  8:06 AM    START taking these medications   Details  vancomycin (VANCOCIN) 1 GM/200ML SOLN Inject 200 mLs (1,000 mg total) into the vein every 12 (twelve) hours., Starting 08/17/2014, Until Discontinued, No Print      CONTINUE these medications which have NOT CHANGED   Details  apixaban (ELIQUIS) 5 MG TABS tablet Take 1 tablet (5 mg total) by mouth 2 (two) times daily., Starting 05/25/2014, Until Discontinued, Normal    dexamethasone (DECADRON) 4 MG tablet Take 4 mg by mouth 2 (two) times daily., Starting 07/23/2014, Until Discontinued, Historical Med    dronabinol (MARINOL) 5 MG capsule Take 5 mg by mouth 2 (two) times daily. , Until Discontinued, Historical Med    fentaNYL (DURAGESIC - DOSED MCG/HR) 100 MCG/HR Place 2 patches (200 mcg total) onto the skin every 3 (three)  days., Starting 08/04/2014, Until Discontinued, Print    furosemide (LASIX) 40 MG tablet Take 1 tablet (40 mg total) by mouth 2 (two) times daily., Starting 06/08/2014, Until Discontinued, Normal    gabapentin (NEURONTIN) 300 MG capsule Take 1 capsule (300 mg total) by mouth 3 (three) times daily., Starting 05/20/2014, Until Discontinued, Normal    haloperidol (HALDOL) 5 MG  tablet Take 1 tablet by mouth every 8 (eight) hours as needed., Starting 06/03/2014, Until Discontinued, Historical Med    HYDROmorphone (DILAUDID) 4 MG tablet Take 1 tablet (4 mg total) by mouth every 4 (four) hours as needed for severe pain., Starting 08/10/2014, Until Discontinued, Print    Ipratropium-Albuterol (COMBIVENT) 20-100 MCG/ACT AERS respimat Inhale 1 puff into the lungs 4 (four) times daily as needed. For shortness of breath and wheezing, Starting 05/20/2014, Until Discontinued, Normal    LIDODERM 5 % Place 1 patch onto the skin daily., Starting 08/04/2014, Until Discontinued, Normal    metoCLOPramide (REGLAN) 5 MG tablet Take 5 mg by mouth 4 (four) times daily as needed for nausea., Until Discontinued, Historical Med    metoprolol tartrate (LOPRESSOR) 25 MG tablet Take 0.5 tablets (12.5 mg total) by mouth 2 (two) times daily., Starting 06/28/2014, Until Discontinued, Print    omeprazole (PRILOSEC) 20 MG capsule Take 20 mg by mouth daily., Until Discontinued, Historical Med    ondansetron (ZOFRAN) 8 MG tablet Take 8 mg by mouth every 8 (eight) hours as needed for nausea or vomiting., Until Discontinued, Historical Med    potassium chloride SA (K-DUR,KLOR-CON) 20 MEQ tablet Take 1 tablet (20 mEq total) by mouth 2 (two) times daily., Starting 05/20/2014, Until Discontinued, Normal    predniSONE (DELTASONE) 5 MG tablet Take 2 tablets (10 mg total) by mouth daily with breakfast. 10 mg po daily for 2 days, 5 mg po daily for 2 days., Starting 06/28/2014, Until Discontinued, Print    promethazine (PHENERGAN) 25 MG tablet Take 1 tablet (25 mg total) by mouth every 6 (six) hours as needed. For nausea and vomiting., Starting 06/10/2014, Until Discontinued, Normal    scopolamine (TRANSDERM-SCOP) 1 MG/3DAYS Place 1 patch (1.5 mg total) onto the skin every 3 (three) days., Starting 08/04/2014, Until Discontinued, Normal    conjugated estrogens (PREMARIN) vaginal cream Place 1 Applicatorful vaginally 3  (three) times a week. Pt uses on Monday, Wednesday, and Friday., Until Discontinued, Historical Med      STOP taking these medications     sulfamethoxazole-trimethoprim (BACTRIM,SEPTRA) 400-80 MG per tablet          Today    VITAL SIGNS:  Blood pressure 113/86, pulse 90, temperature 97.8 F (36.6 C), temperature source Oral, resp. rate 20, height '5\' 6"'$  (1.676 m), weight 71.895 kg (158 lb 8 oz), SpO2 92 %.  I/O:   Intake/Output Summary (Last 24 hours) at 08/17/14 1421 Last data filed at 08/17/14 0900  Gross per 24 hour  Intake    480 ml  Output      0 ml  Net    480 ml    PHYSICAL EXAMINATION:  Physical Exam  GENERAL:  54 y.o.-year-old patient lying in the bed with no acute distress.  LUNGS: Normal breath sounds bilaterally, no wheezing, rales,rhonchi or crepitation. No use of accessory muscles of respiration.  CARDIOVASCULAR: S1, S2 normal. No murmurs, rubs, or gallops.  ABDOMEN: Soft, non-tender, non-distended. Bowel sounds present. No organomegaly or mass.  NEUROLOGIC: Moves all 4 extremities. PSYCHIATRIC: The patient is alert and oriented x 3.  SKIN: No  obvious rash, lesion, or ulcer.  Port-A-Cath site clean without any tenderness or discharge.  DATA REVIEW:   CBC  Recent Labs Lab 08/14/14 0438  WBC 6.8  HGB 11.2*  HCT 34.9*  PLT 164    Chemistries   Recent Labs Lab 08/14/14 0438  NA 138  K 3.4*  CL 95*  CO2 33*  GLUCOSE 172*  BUN 20  CREATININE 0.75  CALCIUM 8.3*    Cardiac Enzymes  Recent Labs Lab 08/12/14 2355  TROPONINI <0.03    Microbiology Results  Results for orders placed or performed during the hospital encounter of 08/12/14  Culture, blood (routine x 2)     Status: None   Collection Time: 08/13/14  3:17 AM  Result Value Ref Range Status   Specimen Description PORTA CATH  Final   Special Requests BOTTLES DRAWN AEROBIC AND ANAEROBIC 5ML  Final   Culture  Setup Time   Final    GRAM POSITIVE COCCI IN BOTH AEROBIC AND  ANAEROBIC BOTTLES CRITICAL RESULT CALLED TO, READ BACK BY AND VERIFIED WITH: MARCEL TURNER AT 2040 08/13/14.PMH CONFIRMED BY HS    Culture   Final    STAPHYLOCOCCUS EPIDERMIDIS IN BOTH AEROBIC AND ANAEROBIC BOTTLES    Report Status 08/15/2014 FINAL  Final   Organism ID, Bacteria STAPHYLOCOCCUS EPIDERMIDIS  Final      Susceptibility   Staphylococcus epidermidis - MIC*    CIPROFLOXACIN >=8 RESISTANT Resistant     ERYTHROMYCIN >=8 RESISTANT Resistant     GENTAMICIN <=0.5 SENSITIVE Sensitive     OXACILLIN Value in next row Resistant      >=4 RESISTANTWARNING: For oxacillin-resistant S.aureus and coagulase-negative staphylococci (MRS), other beta-lactam agents, ie, penicillins, beta-lactam/beta-lactamase inhibitor combinations, cephems (with the exception of the cephalosporins with anti-MRSA activity), and carbapenems, may appear active in vitro, but are not effective clinically.  --CLSI, Vol.32 No.3, January 2012, pg 70.    TETRACYCLINE Value in next row Sensitive      >=4 RESISTANTWARNING: For oxacillin-resistant S.aureus and coagulase-negative staphylococci (MRS), other beta-lactam agents, ie, penicillins, beta-lactam/beta-lactamase inhibitor combinations, cephems (with the exception of the cephalosporins with anti-MRSA activity), and carbapenems, may appear active in vitro, but are not effective clinically.  --CLSI, Vol.32 No.3, January 2012, pg 70.    VANCOMYCIN Value in next row Sensitive      >=4 RESISTANTWARNING: For oxacillin-resistant S.aureus and coagulase-negative staphylococci (MRS), other beta-lactam agents, ie, penicillins, beta-lactam/beta-lactamase inhibitor combinations, cephems (with the exception of the cephalosporins with anti-MRSA activity), and carbapenems, may appear active in vitro, but are not effective clinically.  --CLSI, Vol.32 No.3, January 2012, pg 70.    CLINDAMYCIN Value in next row Resistant      >=4 RESISTANTWARNING: For oxacillin-resistant S.aureus and  coagulase-negative staphylococci (MRS), other beta-lactam agents, ie, penicillins, beta-lactam/beta-lactamase inhibitor combinations, cephems (with the exception of the cephalosporins with anti-MRSA activity), and carbapenems, may appear active in vitro, but are not effective clinically.  --CLSI, Vol.32 No.3, January 2012, pg 70.    * STAPHYLOCOCCUS EPIDERMIDIS  Culture, blood (routine x 2)     Status: None   Collection Time: 08/13/14  3:17 AM  Result Value Ref Range Status   Specimen Description PORTA CATH  Final   Special Requests BOTTLES DRAWN AEROBIC AND ANAEROBIC 6ML  Final   Culture  Setup Time   Final    GRAM POSITIVE COCCI IN BOTH AEROBIC AND ANAEROBIC BOTTLES CRITICAL RESULT CALLED TO, READ BACK BY AND VERIFIED WITH: MARCEL TURNER AT 2040 08/13/14.PMH CONFIRMED BY HS  Culture   Final    STAPHYLOCOCCUS EPIDERMIDIS IN BOTH AEROBIC AND ANAEROBIC BOTTLES    Report Status 08/15/2014 FINAL  Final   Organism ID, Bacteria STAPHYLOCOCCUS EPIDERMIDIS  Final      Susceptibility   Staphylococcus epidermidis - MIC*    CIPROFLOXACIN >=8 RESISTANT Resistant     ERYTHROMYCIN >=8 RESISTANT Resistant     GENTAMICIN <=0.5 SENSITIVE Sensitive     OXACILLIN Value in next row Resistant      >=4 RESISTANTWARNING: For oxacillin-resistant S.aureus and coagulase-negative staphylococci (MRS), other beta-lactam agents, ie, penicillins, beta-lactam/beta-lactamase inhibitor combinations, cephems (with the exception of the cephalosporins with anti-MRSA activity), and carbapenems, may appear active in vitro, but are not effective clinically.  --CLSI, Vol.32 No.3, January 2012, pg 70.    TETRACYCLINE Value in next row Sensitive      >=4 RESISTANTWARNING: For oxacillin-resistant S.aureus and coagulase-negative staphylococci (MRS), other beta-lactam agents, ie, penicillins, beta-lactam/beta-lactamase inhibitor combinations, cephems (with the exception of the cephalosporins with anti-MRSA activity), and  carbapenems, may appear active in vitro, but are not effective clinically.  --CLSI, Vol.32 No.3, January 2012, pg 70.    VANCOMYCIN Value in next row Sensitive      >=4 RESISTANTWARNING: For oxacillin-resistant S.aureus and coagulase-negative staphylococci (MRS), other beta-lactam agents, ie, penicillins, beta-lactam/beta-lactamase inhibitor combinations, cephems (with the exception of the cephalosporins with anti-MRSA activity), and carbapenems, may appear active in vitro, but are not effective clinically.  --CLSI, Vol.32 No.3, January 2012, pg 70.    CLINDAMYCIN Value in next row Resistant      >=4 RESISTANTWARNING: For oxacillin-resistant S.aureus and coagulase-negative staphylococci (MRS), other beta-lactam agents, ie, penicillins, beta-lactam/beta-lactamase inhibitor combinations, cephems (with the exception of the cephalosporins with anti-MRSA activity), and carbapenems, may appear active in vitro, but are not effective clinically.  --CLSI, Vol.32 No.3, January 2012, pg 70.    * STAPHYLOCOCCUS EPIDERMIDIS  Culture, blood (routine x 2)     Status: None (Preliminary result)   Collection Time: 08/14/14  7:46 AM  Result Value Ref Range Status   Specimen Description BLOOD RIGHT HAND  Final   Special Requests   Final    BOTTLES DRAWN AEROBIC AND ANAEROBIC  AER 2CC ANA 4CC   Culture NO GROWTH 3 DAYS  Final   Report Status PENDING  Incomplete  Culture, blood (routine x 2)     Status: None (Preliminary result)   Collection Time: 08/14/14  8:49 AM  Result Value Ref Range Status   Specimen Description BLOOD RIGHT PORTA CATH  Final   Special Requests BOTTLES DRAWN AEROBIC AND ANAEROBIC  4CC  Final   Culture NO GROWTH 3 DAYS  Final   Report Status PENDING  Incomplete    RADIOLOGY:  No results found.    Follow up with PCP in 1 week.  Management plans discussed with the patient, family and they are in agreement.  CODE STATUS:     Code Status Orders        Start     Ordered   08/13/14  1936  Full code   Continuous     08/13/14 1935    Advance Directive Documentation        Most Recent Value   Type of Advance Directive  Living will   Pre-existing out of facility DNR order (yellow form or pink MOST form)     "MOST" Form in Place?        TOTAL TIME TAKING CARE OF THIS PATIENT ON DAY OF DISCHARGE: more than 30 minutes.  Hillary Bow R M.D on 08/17/2014 at 2:21 PM  Between 7am to 6pm - Pager - [phone]  After 6pm go to [email or web address] - password [email or web address] [facility]  Office  [phone]  CC: Primary care physician; Pcp Not In System

## 2014-08-17 NOTE — Plan of Care (Signed)
Problem: Discharge Progression Outcomes Goal: Other Discharge Outcomes/Goals Plan of care progress to goal; - A transfer from 2a. - Pain control via PRN pain med. - Ambulates in room, on high fall precautions, refuses bed alarm. - Nausea controlled by PRN  IV phenergan. Will continue to monitor.

## 2014-08-17 NOTE — Plan of Care (Signed)
Problem: Discharge Progression Outcomes Goal: Other Discharge Outcomes/Goals Outcome: Progressing Plan of Care Progress to Goal:  Pt d/ced home w/hospice.  Hospice nurse will see her 2x/day to administer IV abx through port.  Chronic pain - L chest pain - 8 out of 10.  No nausea reported.  Pt ambulates by herself although she had fall before this admission.

## 2014-08-17 NOTE — Care Management (Signed)
Discharge to home today per Dr. Jerelyn Charles. [facility] will be administering Vancomycin IV twice a day x 2 weeks, Family will transport. Shelbie Ammons RN MSN  Care Management [phone]

## 2014-08-17 NOTE — Progress Notes (Signed)
Visit made. Patient seen sitting up in bed, alert and interactive, ready for discharge. Sister in law Henri present to take her home via car. Patient has received her dose of IV vancomycin via her portacath this morning, next dose due at 8 pm this evening, next needle change due on 8/18. Patient voiced her understanding that the hospice RN would be administering her antibiotics at her home, on call nurse to call prior to arrival for evening dose tonight. Vancomycin prescription faxed to triage on 8/15 all needed information given via phone call this morning by writer to [employer]. Home care team notified of discharge, discharge medication list faxed to referral intake. Thank you. Flo Shanks RN, BSN, [facility], [facility] [phone] c

## 2014-08-19 LAB — CULTURE, BLOOD (ROUTINE X 2)
CULTURE: NO GROWTH
Culture: NO GROWTH

## 2014-08-23 ENCOUNTER — Other Ambulatory Visit: Payer: Self-pay | Admitting: *Deleted

## 2014-08-23 MED ORDER — PROMETHAZINE HCL 25 MG PO TABS: 25.0000 mg | ORAL_TABLET | Freq: Four times a day (QID) | ORAL | Status: DC | PRN

## 2014-08-24 ENCOUNTER — Telehealth: Payer: Self-pay | Admitting: *Deleted

## 2014-08-24 MED ORDER — PROMETHAZINE HCL 25 MG PO TABS: 25.0000 mg | ORAL_TABLET | Freq: Four times a day (QID) | ORAL | Status: AC | PRN

## 2014-08-24 MED ORDER — FENTANYL 100 MCG/HR TD PT72
200.0000 ug | MEDICATED_PATCH | TRANSDERMAL | Status: DC
Start: 2014-08-24 — End: 2014-09-10

## 2014-08-24 NOTE — Telephone Encounter (Signed)
faxed

## 2014-08-25 LAB — GLUCOSE, CAPILLARY: Glucose-Capillary: 137 mg/dL — ABNORMAL HIGH (ref 65–99)

## 2014-08-30 ENCOUNTER — Inpatient Hospital Stay

## 2014-08-31 ENCOUNTER — Other Ambulatory Visit: Payer: Self-pay | Admitting: *Deleted

## 2014-08-31 ENCOUNTER — Inpatient Hospital Stay: Attending: Oncology

## 2014-08-31 DIAGNOSIS — C3490 Malignant neoplasm of unspecified part of unspecified bronchus or lung: Secondary | ICD-10-CM

## 2014-08-31 DIAGNOSIS — C3491 Malignant neoplasm of unspecified part of right bronchus or lung: Secondary | ICD-10-CM | POA: Insufficient documentation

## 2014-08-31 DIAGNOSIS — C7951 Secondary malignant neoplasm of bone: Secondary | ICD-10-CM | POA: Insufficient documentation

## 2014-09-05 LAB — CULTURE, BLOOD (ROUTINE X 2)
CULTURE: NO GROWTH
CULTURE: NO GROWTH

## 2014-09-10 ENCOUNTER — Other Ambulatory Visit: Payer: Self-pay | Admitting: *Deleted

## 2014-09-10 MED ORDER — FENTANYL 100 MCG/HR TD PT72: 200.0000 ug | MEDICATED_PATCH | TRANSDERMAL | Status: AC

## 2014-09-10 NOTE — Telephone Encounter (Signed)
[name] requests a call when the med is faxed to pharmacy

## 2014-09-10 NOTE — Telephone Encounter (Signed)
faxed

## 2014-09-27 ENCOUNTER — Encounter: Payer: Self-pay | Admitting: Infectious Diseases

## 2014-11-11 ENCOUNTER — Other Ambulatory Visit: Payer: Self-pay | Admitting: Nurse Practitioner

## 2015-02-27 ENCOUNTER — Encounter: Payer: Self-pay | Admitting: Emergency Medicine

## 2015-02-27 ENCOUNTER — Emergency Department

## 2015-02-27 ENCOUNTER — Inpatient Hospital Stay
Admission: EM | Admit: 2015-02-27 | Discharge: 2015-04-02 | DRG: 189 | Disposition: E | Attending: Internal Medicine | Admitting: Internal Medicine

## 2015-02-27 DIAGNOSIS — J9621 Acute and chronic respiratory failure with hypoxia: Principal | ICD-10-CM | POA: Diagnosis present

## 2015-02-27 DIAGNOSIS — Z87891 Personal history of nicotine dependence: Secondary | ICD-10-CM

## 2015-02-27 DIAGNOSIS — C3490 Malignant neoplasm of unspecified part of unspecified bronchus or lung: Secondary | ICD-10-CM | POA: Diagnosis present

## 2015-02-27 DIAGNOSIS — I10 Essential (primary) hypertension: Secondary | ICD-10-CM | POA: Diagnosis present

## 2015-02-27 DIAGNOSIS — I5032 Chronic diastolic (congestive) heart failure: Secondary | ICD-10-CM | POA: Diagnosis present

## 2015-02-27 DIAGNOSIS — I5033 Acute on chronic diastolic (congestive) heart failure: Secondary | ICD-10-CM | POA: Diagnosis present

## 2015-02-27 DIAGNOSIS — G9340 Encephalopathy, unspecified: Secondary | ICD-10-CM | POA: Diagnosis present

## 2015-02-27 DIAGNOSIS — R0902 Hypoxemia: Secondary | ICD-10-CM

## 2015-02-27 DIAGNOSIS — L8990 Pressure ulcer of unspecified site, unspecified stage: Secondary | ICD-10-CM | POA: Insufficient documentation

## 2015-02-27 DIAGNOSIS — E119 Type 2 diabetes mellitus without complications: Secondary | ICD-10-CM

## 2015-02-27 DIAGNOSIS — Z66 Do not resuscitate: Secondary | ICD-10-CM | POA: Diagnosis present

## 2015-02-27 DIAGNOSIS — J189 Pneumonia, unspecified organism: Secondary | ICD-10-CM | POA: Diagnosis present

## 2015-02-27 DIAGNOSIS — C7951 Secondary malignant neoplasm of bone: Secondary | ICD-10-CM | POA: Diagnosis present

## 2015-02-27 DIAGNOSIS — R918 Other nonspecific abnormal finding of lung field: Secondary | ICD-10-CM | POA: Diagnosis present

## 2015-02-27 DIAGNOSIS — Y95 Nosocomial condition: Secondary | ICD-10-CM | POA: Diagnosis present

## 2015-02-27 DIAGNOSIS — J9601 Acute respiratory failure with hypoxia: Secondary | ICD-10-CM | POA: Diagnosis present

## 2015-02-27 DIAGNOSIS — Z86718 Personal history of other venous thrombosis and embolism: Secondary | ICD-10-CM

## 2015-02-27 DIAGNOSIS — E872 Acidosis: Secondary | ICD-10-CM | POA: Diagnosis present

## 2015-02-27 DIAGNOSIS — K219 Gastro-esophageal reflux disease without esophagitis: Secondary | ICD-10-CM | POA: Diagnosis present

## 2015-02-27 HISTORY — DX: Type 2 diabetes mellitus without complications: E11.9

## 2015-02-27 LAB — CBC
HCT: 34.9 % — ABNORMAL LOW (ref 35.0–47.0)
Hemoglobin: 10.8 g/dL — ABNORMAL LOW (ref 12.0–16.0)
MCH: 24.3 pg — ABNORMAL LOW (ref 26.0–34.0)
MCHC: 30.9 g/dL — ABNORMAL LOW (ref 32.0–36.0)
MCV: 78.7 fL — ABNORMAL LOW (ref 80.0–100.0)
Platelets: 140 10*3/uL — ABNORMAL LOW (ref 150–440)
RBC: 4.43 MIL/uL (ref 3.80–5.20)
RDW: 21.9 % — ABNORMAL HIGH (ref 11.5–14.5)
WBC: 11.8 10*3/uL — ABNORMAL HIGH (ref 3.6–11.0)

## 2015-02-27 LAB — COMPREHENSIVE METABOLIC PANEL
ALK PHOS: 86 U/L (ref 38–126)
ALT: 31 U/L (ref 14–54)
AST: 26 U/L (ref 15–41)
Albumin: 3 g/dL — ABNORMAL LOW (ref 3.5–5.0)
Anion gap: 10 (ref 5–15)
BILIRUBIN TOTAL: 0.7 mg/dL (ref 0.3–1.2)
BUN: 21 mg/dL — AB (ref 6–20)
CHLORIDE: 102 mmol/L (ref 101–111)
CO2: 22 mmol/L (ref 22–32)
CREATININE: 0.56 mg/dL (ref 0.44–1.00)
Calcium: 8 mg/dL — ABNORMAL LOW (ref 8.9–10.3)
GFR calc Af Amer: >60 mL/min (ref >60)
Glucose, Bld: 220 mg/dL — ABNORMAL HIGH (ref 65–99)
Potassium: 4.9 mmol/L (ref 3.5–5.1)
Sodium: 134 mmol/L — ABNORMAL LOW (ref 135–145)
Total Protein: 5.9 g/dL — ABNORMAL LOW (ref 6.5–8.1)

## 2015-02-27 LAB — LIPASE, BLOOD: LIPASE: 31 U/L (ref 11–51)

## 2015-02-27 LAB — GLUCOSE, CAPILLARY: Glucose-Capillary: 212 mg/dL — ABNORMAL HIGH (ref 65–99)

## 2015-02-27 LAB — BRAIN NATRIURETIC PEPTIDE: B Natriuretic Peptide: 68 pg/mL (ref 0.0–100.0)

## 2015-02-27 LAB — LACTIC ACID, PLASMA: Lactic Acid, Venous: 3 mmol/L (ref 0.5–2.0)

## 2015-02-27 MED ORDER — IPRATROPIUM-ALBUTEROL 0.5-2.5 (3) MG/3ML IN SOLN
3.0000 mL | Freq: Once | RESPIRATORY_TRACT | Status: AC
  Administered 2015-02-27: 3 mL via RESPIRATORY_TRACT
  Filled 2015-02-27: qty 3

## 2015-02-27 MED ORDER — ALBUTEROL SULFATE (2.5 MG/3ML) 0.083% IN NEBU
2.5000 mg | INHALATION_SOLUTION | RESPIRATORY_TRACT | Status: DC
  Filled 2015-02-27: qty 3

## 2015-02-27 MED ORDER — PIPERACILLIN-TAZOBACTAM 3.375 G IVPB
3.3750 g | Freq: Once | INTRAVENOUS | Status: AC
  Administered 2015-02-27: 3.375 g via INTRAVENOUS
  Filled 2015-02-27: qty 50

## 2015-02-27 MED ORDER — VANCOMYCIN HCL IN DEXTROSE 1-5 GM/200ML-% IV SOLN
1000.0000 mg | Freq: Once | INTRAVENOUS | Status: AC
  Administered 2015-02-27: 1000 mg via INTRAVENOUS
  Filled 2015-02-27: qty 200

## 2015-02-27 MED ORDER — METHYLPREDNISOLONE SODIUM SUCC 125 MG IJ SOLR
125.0000 mg | INTRAMUSCULAR | Status: AC
  Administered 2015-02-27: 125 mg via INTRAVENOUS
  Filled 2015-02-27: qty 2

## 2015-02-27 NOTE — Progress Notes (Signed)
Patient arrived via ems. Respiratory distress. Patient on svn treatment. 2 given per ems. Transitioned patient over to a nrb mask. Saturation noted at 94%.

## 2015-02-27 NOTE — Progress Notes (Signed)
bipap initiated on this patient. MD at bedside

## 2015-02-27 NOTE — ED Provider Notes (Signed)
[facility] Emergency Department Provider Note  ____________________________________________  Time seen: Approximately 9:16 PM  I have reviewed the triage vital signs and the nursing notes.   HISTORY  Chief Complaint Respiratory Distress  EM caveat: Patient in respiratory distress, unable to provide clear clinical history. Acuity.  HPI [name] is a 55 y.o. female presentsfor hypoxia. Patient evidently developed oxygen saturations as low as 50%, was pale, mottled on EMS arrival. She was noted to be wheezing and tachypnea, and given nebulizer treatments with some improvement. She is presently satting in the 90s on nonrebreather.  Patient does have documentation and paperwork with her that she is DO NOT RESUSCITATE. EMS reports she is currently followed by hospice team.  Medications reviewed notable patient is on eliquis.   Past Medical History  Diagnosis Date  . Pneumonia   . Lung cancer ([place])   . DVT (deep venous thrombosis) ([place])   . Urinary incontinence   . HTN (hypertension)   . Chronic diastolic congestive heart failure [facility])     Patient Active Problem List   Diagnosis Date Noted  . DVT (deep venous thrombosis) ([place]) 06/22/2014  . Chronic diastolic congestive heart failure ([place]) 06/22/2014  . Chest pain 06/22/2014  . Acute respiratory failure with hypoxemia ([place]) 06/22/2014  . Sepsis ([place]) 06/22/2014  . Hypokalemia 06/22/2014  . Pneumonia 05/16/2014  . Shortness of breath 05/16/2014  . Pleuritic chest pain 01/21/2013  . Pulmonary infiltrates 01/12/2013  . Adenocarcinoma of lung, stage 4 ([place]) 01/12/2013  . Pericardial tamponade 01/11/2013    Past Surgical History  Procedure Laterality Date  . Video bronchoscopy Bilateral 01/14/2013    Procedure: VIDEO BRONCHOSCOPY WITHOUT FLUORO;  Surgeon: Wilhelmina Mcardle, MD;  Location: [facility] ENDOSCOPY;  Service: Cardiopulmonary;  Laterality: Bilateral;  . Video bronchoscopy N/A 01/27/2013     Procedure: VIDEO BRONCHOSCOPY;  Surgeon: Ivin Poot, MD;  Location: [place];  Service: Thoracic;  Laterality: N/A;  . Video assisted thoracoscopy Right 01/27/2013    Procedure: VIDEO ASSISTED THORACOSCOPY;  Surgeon: Ivin Poot, MD;  Location: [place];  Service: Thoracic;  Laterality: Right;  . Lung biopsy Right 01/27/2013    Procedure: LUNG BIOPSY;  Surgeon: Ivin Poot, MD;  Location: [place];  Service: Thoracic;  Laterality: Right;  . Pericardial tap N/A 01/11/2013    Procedure: PERICARDIAL TAP;  Surgeon: Blane Ohara, MD;  Location: [facility] CATH LAB;  Service: Cardiovascular;  Laterality: N/A;  . Bladder surgery      Current Outpatient Rx  Name  Route  Sig  Dispense  Refill  . ALPRAZolam (XANAX) 0.5 MG tablet   Oral   Take 0.5 mg by mouth 3 (three) times daily as needed for anxiety.         Marland Kitchen apixaban (ELIQUIS) 5 MG TABS tablet   Oral   Take 1 tablet (5 mg total) by mouth 2 (two) times daily.   60 tablet   0   . dexamethasone (DECADRON) 4 MG tablet   Oral   Take 4 mg by mouth 2 (two) times daily.      4   . fentaNYL (DURAGESIC - DOSED MCG/HR) 100 MCG/HR   Transdermal   Place 2 patches (200 mcg total) onto the skin every 3 (three) days.   10 patch   0     HOSPICE PATIENT   . furosemide (LASIX) 40 MG tablet   Oral   Take 1 tablet (40 mg total) by mouth 2 (two) times  daily. Patient taking differently: Take 40 mg by mouth daily.    180 tablet   0   . gabapentin (NEURONTIN) 300 MG capsule   Oral   Take 1 capsule (300 mg total) by mouth 3 (three) times daily.   90 capsule   1   . glipiZIDE (GLUCOTROL) 5 MG tablet   Oral   Take 5 mg by mouth daily.         . haloperidol (HALDOL) 1 MG tablet   Oral   Take 1 mg by mouth 2 (two) times daily.         Marland Kitchen HYDROmorphone (DILAUDID) 4 MG tablet   Oral   Take 1 tablet (4 mg total) by mouth every 4 (four) hours as needed for severe pain. Patient taking differently: Take 4 mg by mouth 2 (two) times daily.    90  tablet   0     HOSPICE PATIENT   . insulin lispro protamine-lispro (HUMALOG 75/25 MIX) (75-25) 100 UNIT/ML SUSP injection   Subcutaneous   Inject 10 Units into the skin 2 (two) times daily with a meal.         . metoprolol tartrate (LOPRESSOR) 25 MG tablet   Oral   Take 0.5 tablets (12.5 mg total) by mouth 2 (two) times daily.   30 tablet   0     Hold if SBP<100 or DBP<60   . omeprazole (PRILOSEC) 20 MG capsule   Oral   Take 20 mg by mouth daily.         . potassium chloride SA (K-DUR,KLOR-CON) 20 MEQ tablet   Oral   Take 1 tablet (20 mEq total) by mouth 2 (two) times daily.   60 tablet   1   . QUEtiapine (SEROQUEL) 100 MG tablet   Oral   Take 100 mg by mouth 2 (two) times daily.         . sennosides-docusate sodium (SENOKOT-S) 8.6-50 MG tablet   Oral   Take 1-3 tablets by mouth 2 (two) times daily.         . temazepam (RESTORIL) 15 MG capsule   Oral   Take 15-30 mg by mouth at bedtime as needed for sleep.         . Ipratropium-Albuterol (COMBIVENT) 20-100 MCG/ACT AERS respimat   Inhalation   Inhale 1 puff into the lungs 4 (four) times daily as needed. For shortness of breath and wheezing Patient not taking: Reported on 02/12/2015   1 Inhaler   1   . LIDODERM 5 %   Transdermal   Place 1 patch onto the skin daily. Patient not taking: Reported on 02/18/2015   30 patch   0     Dispense as written.   . predniSONE (DELTASONE) 5 MG tablet   Oral   Take 2 tablets (10 mg total) by mouth daily with breakfast. 10 mg po daily for 2 days, 5 mg po daily for 2 days. Patient not taking: Reported on 02/13/2015   6 tablet   0   . promethazine (PHENERGAN) 25 MG tablet   Oral   Take 1 tablet (25 mg total) by mouth every 6 (six) hours as needed for nausea or vomiting. Patient not taking: Reported on 02/17/2015   45 tablet   2   . scopolamine (TRANSDERM-SCOP) 1 MG/3DAYS   Transdermal   Place 1 patch (1.5 mg total) onto the skin every 3 (three) days. Patient  not taking: Reported on 02/17/2015   30 patch  0   . vancomycin (VANCOCIN) 1 GM/200ML SOLN   Intravenous   Inject 200 mLs (1,000 mg total) into the vein every 12 (twelve) hours. Patient not taking: Reported on 02/25/2015   4000 mL        Allergies Lorazepam; Tape; and Tegaderm ag mesh  Family History  Problem Relation Age of Onset  . Coronary artery disease Mother 44  . Lung cancer Mother   . Prostate cancer Father   . Breast cancer Mother   . Heart attack Mother     Social History Social History  Substance Use Topics  . Smoking status: Former Research scientist (life sciences)  . Smokeless tobacco: Never Used  . Alcohol Use: Yes     Comment: occasionally has a drink    Review of Systems Patient unable to provide due to dyspnea.  ____________________________________________   PHYSICAL EXAM:  VITAL SIGNS: ED Triage Vitals  Enc Vitals Group     BP 02/09/2015 2113 137/103 mmHg     Pulse Rate 03/01/2015 2113 150     Resp 02/19/2015 2113 20     Temp --      Temp src --      SpO2 02/26/2015 2113 91 %     Weight 02/25/2015 2113 194 lb 1 oz (88.026 kg)     Height 02/03/2015 2113 '5\' 4"'$  (1.626 m)     Head Cir --      Peak Flow --      Pain Score --      Pain Loc --      Pain Edu? --      Excl. in [place]? --    Constitutional: Alert but seemingly slightly disoriented. She appears to be in moderate to severe respiratory distress, with mottling of the lower extremities. Eyes: Conjunctivae are normal.  Head: Atraumatic. Nose: No congestion/rhinnorhea. Mouth/Throat: Mucous membranes are very dry.   Neck: No stridor.   Cardiovascular: Very tachycardic rate, regular rhythm. Grossly normal heart sounds.  Cool extremities with mottling of the lower extremities and hands noted.  Respiratory: Moderate increased work of breathing. Mildly increased and expiratory phase with crackles heard in the lower bases bilaterally. Patient speaks one to 2 words, and seems to be just slightly confused. She is not tripoding and  EMS reports her work of breathing has improved significantly since their arrival and placed on oxygen.  Gastrointestinal: Soft and nontender.  Musculoskeletal: No lower extremity tenderness but 3+ large tremor E pitting edema.  Neurologic:  Slightly confused, she does know her name is oriented to name and place. She occasionally seems to demonstrate some slight confusion though she is purposeful and follows commands. Skin:  Skin is cool. Psychiatric: Mood and affect are anxious. ____________________________________________   LABS (all labs ordered are listed, but only abnormal results are displayed)  Labs Reviewed  CBC - Abnormal; Notable for the following:    WBC 11.8 (*)    Hemoglobin 10.8 (*)    HCT 34.9 (*)    MCV 78.7 (*)    MCH 24.3 (*)    MCHC 30.9 (*)    RDW 21.9 (*)    Platelets 140 (*)    All other components within normal limits  COMPREHENSIVE METABOLIC PANEL - Abnormal; Notable for the following:    Sodium 134 (*)    Glucose, Bld 220 (*)    BUN 21 (*)    Calcium 8.0 (*)    Total Protein 5.9 (*)    Albumin 3.0 (*)    All other  components within normal limits  LACTIC ACID, PLASMA - Abnormal; Notable for the following:    Lactic Acid, Venous 3.0 (*)    All other components within normal limits  BLOOD GAS, VENOUS - Abnormal; Notable for the following:    pCO2, Ven 42 (*)    Acid-base deficit 2.4 (*)    All other components within normal limits  GLUCOSE, CAPILLARY - Abnormal; Notable for the following:    Glucose-Capillary 212 (*)    All other components within normal limits  CULTURE, BLOOD (ROUTINE X 2)  CULTURE, BLOOD (ROUTINE X 2)  LIPASE, BLOOD  BRAIN NATRIURETIC PEPTIDE  LACTIC ACID, PLASMA   ____________________________________________  EKG  Reviewed and interpreted by me at 2115 Probable sinus tachycardia with a heart rate of 150 Somewhat poor tracing QRS 1:30 QTc 490 No obvious acute ischemic change, though there is diffuse T-wave abnormality  noted. Computer reads as atrial flutter, however reviewing the EKG I suspect there is sinus tachycardia given what appear to be notable P-wave seen especially well lead proceeding each QRS complex. ____________________________________________  [id number]    DG Chest Portable 1 View (Final result) Result time: 03/01/2015 22:27:31   Final result by Rad Results In Interface (02/16/2015 22:27:31)   Narrative:   CLINICAL DATA: 54 year old female with respiratory distress from nursing home. History of pneumonia and lung cancer.  EXAM: PORTABLE CHEST 1 VIEW  COMPARISON: Radiograph dated 08/13/2014  FINDINGS: Single portable view of the chest demonstrate diffuse reticular and nodular interstitial prominence. There are patchy areas of airspace opacity in the perihilar region as well as in the right lung base, new from prior study. Differentials include worsening spread of tumor, pneumonia, or pulmonary edema. Clinical correlation and follow-up recommended. There is no pneumothorax. Surgical suture material noted in the right upper lung field. Right pectoral Port-A-Cath with tip over central SVC noted. No acute osseous pathology.  IMPRESSION: Diffuse reticulonodular interstitial densities with new areas of airspace opacities. Findings concerning for pneumonia or worsening pulmonary edema on possible underlying lymphangitic spread of tumor.   Electronically Signed By: Anner Crete M.D. On: 02/16/2015 22:27    ____________________________________________   PROCEDURES  Procedure(s) performed: None  Critical Care performed: Yes, see critical care note(s)  CRITICAL CARE Performed by: Delman Kitten   Total critical care time: 50 minutes  Critical care time was exclusive of separately billable procedures and treating other patients.  Critical care was necessary to treat or prevent imminent or life-threatening deterioration.  Critical care was time spent personally by  me on the following activities: development of treatment plan with patient and/or surrogate as well as nursing, discussions with consultants, evaluation of patient's response to treatment, examination of patient, obtaining history from patient or surrogate, ordering and performing treatments and interventions, ordering and review of laboratory studies, ordering and review of radiographic studies, pulse oximetry and re-evaluation of patient's condition.  ____________________________________________   INITIAL IMPRESSION / ASSESSMENT AND PLAN / ED COURSE  Pertinent labs & imaging results that were available during my care of the patient were reviewed by me and considered in my medical decision making (see chart for details).  Patient presents with severe hypoxia. Exam concerning for volume overload, but also based on assessment concern for pneumonia. Chest x-ray very concerning for pneumonia, she is not hypotensive though quite tachycardic, but also demonstrates evidence of probable volume overload as well and pulmonary edema. At this point I will not initiate high-volume fluid resuscitation due to the patient's respiratory status, concerning exam for volume  overload, but we will initiate antibiotics. ----------------------------------------- 10:09 PM on 02/26/2015 -----------------------------------------  Patient condition is extremely critical, she is confused, seems slightly encephalopathic, and is taking poor respirations. Patient has a DO NOT RESUSCITATE order, and I called and discussed with her healthcare power of attorney and daughter named [name]. We discussed patient's current condition and [name] indicates to me after discussion that her mother would not wish to be supported by machines, she would not wish to be placed on a ventilator, and she would not wish to have her heart restarted or receive CPR. If the patient appears to be in severe extremis and likely dying their primary goal of  care would be pain control and comfort. Family is in [place] and does wish to come and see her, they are okay with trialing noninvasive ventilation such as BiPAP, but DO NOT INTUBATE and DO NOT RESUSCITATE. They would not wish for her to receive highly invasive procedures or mechanical ventilation. I think this is reasonable. Based on her current presentation I will initiate antibiotics, BiPAP, respiratory treatments, and continue with medical management, but we'll respect the patient's wishes regarding heroic measures/intubation/cpr, etc. Patient's mother also at the bedside and is agreeable, understanding critical nature of her illness.  ----------------------------------------- 11:11 PM on 02/25/2015 -----------------------------------------  Family updated and critical nature of condition. Family continues to note that primary goal care is comfort for the patient though they do wish for IV antibiotics and BiPAP, which I think is not unreasonable given a goal to try to have family from [place] also be able to see her. The patient is still confused, somnolent, but is tolerating BiPAP.  Discussed with the admitting service, Dr. Jannifer Franklin plan to see and admit the patient hospital. Hospice team and chaplain at bedside.  ____________________________________________   FINAL CLINICAL IMPRESSION(S) / ED DIAGNOSES  Final diagnoses:  Healthcare-associated pneumonia  Hypoxia      Delman Kitten, MD 02/18/2015 2311

## 2015-02-27 NOTE — ED Notes (Addendum)
Pt presents to EMS from [place] for hypoxia and respiratory distress. On 5 LO2 at facility 50% O2 sats. Mottling present; pitting edema present. Alert and oriented x4

## 2015-02-28 ENCOUNTER — Encounter: Payer: Self-pay | Admitting: Internal Medicine

## 2015-02-28 DIAGNOSIS — K219 Gastro-esophageal reflux disease without esophagitis: Secondary | ICD-10-CM | POA: Diagnosis present

## 2015-02-28 DIAGNOSIS — J189 Pneumonia, unspecified organism: Secondary | ICD-10-CM | POA: Diagnosis present

## 2015-02-28 DIAGNOSIS — I5032 Chronic diastolic (congestive) heart failure: Secondary | ICD-10-CM | POA: Diagnosis present

## 2015-02-28 DIAGNOSIS — C3490 Malignant neoplasm of unspecified part of unspecified bronchus or lung: Secondary | ICD-10-CM | POA: Diagnosis not present

## 2015-02-28 DIAGNOSIS — E119 Type 2 diabetes mellitus without complications: Secondary | ICD-10-CM | POA: Diagnosis not present

## 2015-02-28 DIAGNOSIS — Z87891 Personal history of nicotine dependence: Secondary | ICD-10-CM | POA: Diagnosis not present

## 2015-02-28 DIAGNOSIS — G9340 Encephalopathy, unspecified: Secondary | ICD-10-CM | POA: Diagnosis present

## 2015-02-28 DIAGNOSIS — E872 Acidosis: Secondary | ICD-10-CM | POA: Diagnosis present

## 2015-02-28 DIAGNOSIS — Y95 Nosocomial condition: Secondary | ICD-10-CM | POA: Diagnosis present

## 2015-02-28 DIAGNOSIS — L8990 Pressure ulcer of unspecified site, unspecified stage: Secondary | ICD-10-CM | POA: Insufficient documentation

## 2015-02-28 DIAGNOSIS — Z86718 Personal history of other venous thrombosis and embolism: Secondary | ICD-10-CM | POA: Diagnosis not present

## 2015-02-28 DIAGNOSIS — C7951 Secondary malignant neoplasm of bone: Secondary | ICD-10-CM | POA: Diagnosis present

## 2015-02-28 DIAGNOSIS — I10 Essential (primary) hypertension: Secondary | ICD-10-CM | POA: Diagnosis not present

## 2015-02-28 DIAGNOSIS — R0902 Hypoxemia: Secondary | ICD-10-CM | POA: Diagnosis present

## 2015-02-28 DIAGNOSIS — I5033 Acute on chronic diastolic (congestive) heart failure: Secondary | ICD-10-CM | POA: Diagnosis present

## 2015-02-28 DIAGNOSIS — Z66 Do not resuscitate: Secondary | ICD-10-CM | POA: Diagnosis present

## 2015-02-28 DIAGNOSIS — J9621 Acute and chronic respiratory failure with hypoxia: Secondary | ICD-10-CM | POA: Diagnosis not present

## 2015-02-28 HISTORY — DX: Type 2 diabetes mellitus without complications: E11.9

## 2015-02-28 LAB — GLUCOSE, CAPILLARY
GLUCOSE-CAPILLARY: 278 mg/dL — AB (ref 65–99)
GLUCOSE-CAPILLARY: 237 mg/dL — AB (ref 65–99)
GLUCOSE-CAPILLARY: 181 mg/dL — AB (ref 65–99)
GLUCOSE-CAPILLARY: 173 mg/dL — AB (ref 65–99)

## 2015-02-28 LAB — BASIC METABOLIC PANEL
ANION GAP: 11 (ref 5–15)
BUN: 21 mg/dL — ABNORMAL HIGH (ref 6–20)
CALCIUM: 7.7 mg/dL — AB (ref 8.9–10.3)
CO2: 25 mmol/L (ref 22–32)
Chloride: 101 mmol/L (ref 101–111)
Creatinine, Ser: 0.6 mg/dL (ref 0.44–1.00)
Glucose, Bld: 283 mg/dL — ABNORMAL HIGH (ref 65–99)
POTASSIUM: 3.2 mmol/L — AB (ref 3.5–5.1)
Sodium: 137 mmol/L (ref 135–145)

## 2015-02-28 LAB — URINALYSIS COMPLETE WITH MICROSCOPIC (ARMC ONLY)
Bilirubin Urine: NEGATIVE
Glucose, UA: 50 mg/dL — AB
KETONES UR: NEGATIVE mg/dL
NITRITE: POSITIVE — AB
Protein, ur: NEGATIVE mg/dL
Specific Gravity, Urine: 1.005 (ref 1.005–1.030)
Squamous Epithelial / LPF: NONE SEEN
pH: 7 (ref 5.0–8.0)

## 2015-02-28 LAB — BLOOD GAS, ARTERIAL
Acid-base deficit: 2.7 mmol/L — ABNORMAL HIGH (ref 0.0–2.0)
Bicarbonate: 20.6 mEq/L — ABNORMAL LOW (ref 21.0–28.0)
DELIVERY SYSTEMS: POSITIVE
Expiratory PAP: 5
FIO2: 0.6
INSPIRATORY PAP: 12
MECHANICAL RATE: 12
O2 Saturation: 98.3 %
PCO2 ART: 31 mmHg — AB (ref 32.0–48.0)
Patient temperature: 37
pH, Arterial: 7.43 (ref 7.350–7.450)
pO2, Arterial: 107 mmHg (ref 83.0–108.0)

## 2015-02-28 LAB — CBC
HEMATOCRIT: 32.3 % — AB (ref 35.0–47.0)
HEMOGLOBIN: 10.2 g/dL — AB (ref 12.0–16.0)
MCH: 25.1 pg — ABNORMAL LOW (ref 26.0–34.0)
MCHC: 31.6 g/dL — ABNORMAL LOW (ref 32.0–36.0)
MCV: 79.5 fL — ABNORMAL LOW (ref 80.0–100.0)
Platelets: 109 10*3/uL — ABNORMAL LOW (ref 150–440)
RBC: 4.06 MIL/uL (ref 3.80–5.20)
RDW: 21.9 % — ABNORMAL HIGH (ref 11.5–14.5)
WBC: 8.2 10*3/uL (ref 3.6–11.0)

## 2015-02-28 LAB — BLOOD GAS, VENOUS
Acid-base deficit: 2.4 mmol/L — ABNORMAL HIGH (ref 0.0–2.0)
BICARBONATE: 23.2 meq/L (ref 21.0–28.0)
FIO2: 1
O2 Saturation: 82 %
PATIENT TEMPERATURE: 37
pCO2, Ven: 42 mmHg — ABNORMAL LOW (ref 44.0–60.0)
pH, Ven: 7.35 (ref 7.320–7.430)

## 2015-02-28 LAB — MRSA PCR SCREENING: MRSA by PCR: POSITIVE — AB

## 2015-02-28 LAB — PHOSPHORUS: PHOSPHORUS: 3.4 mg/dL (ref 2.5–4.6)

## 2015-02-28 LAB — MAGNESIUM: Magnesium: 1.9 mg/dL (ref 1.7–2.4)

## 2015-02-28 LAB — LACTIC ACID, PLASMA: Lactic Acid, Venous: 3.5 mmol/L (ref 0.5–2.0)

## 2015-02-28 MED ORDER — IPRATROPIUM-ALBUTEROL 0.5-2.5 (3) MG/3ML IN SOLN
3.0000 mL | RESPIRATORY_TRACT | Status: DC
  Administered 2015-02-28: 3 mL via RESPIRATORY_TRACT
  Filled 2015-02-28 (×2): qty 3

## 2015-02-28 MED ORDER — APIXABAN 5 MG PO TABS
5.0000 mg | ORAL_TABLET | Freq: Two times a day (BID) | ORAL | Status: DC
  Administered 2015-02-28 (×2): 5 mg via ORAL
  Filled 2015-02-28 (×2): qty 1

## 2015-02-28 MED ORDER — BUDESONIDE 0.5 MG/2ML IN SUSP
0.5000 mg | Freq: Two times a day (BID) | RESPIRATORY_TRACT | Status: DC
  Administered 2015-02-28: 0.5 mg via RESPIRATORY_TRACT
  Filled 2015-02-28: qty 2

## 2015-02-28 MED ORDER — ALPRAZOLAM 0.5 MG PO TABS
0.5000 mg | ORAL_TABLET | Freq: Three times a day (TID) | ORAL | Status: DC
  Administered 2015-02-28 (×2): 0.5 mg via ORAL
  Filled 2015-02-28: qty 1

## 2015-02-28 MED ORDER — POLYETHYLENE GLYCOL 3350 17 G PO PACK: 17.0000 g | PACK | Freq: Every day | ORAL | Status: DC | PRN

## 2015-02-28 MED ORDER — METOPROLOL TARTRATE 1 MG/ML IV SOLN
5.0000 mg | Freq: Once | INTRAVENOUS | Status: AC
  Administered 2015-02-28: 5 mg via INTRAVENOUS
  Filled 2015-02-28: qty 5

## 2015-02-28 MED ORDER — PANTOPRAZOLE SODIUM 40 MG PO TBEC
40.0000 mg | DELAYED_RELEASE_TABLET | Freq: Every day | ORAL | Status: DC
  Administered 2015-02-28: 40 mg via ORAL
  Filled 2015-02-28: qty 1

## 2015-02-28 MED ORDER — INSULIN ASPART 100 UNIT/ML ~~LOC~~ SOLN: 0.0000 [IU] | Freq: Every day | SUBCUTANEOUS | Status: DC

## 2015-02-28 MED ORDER — DOCUSATE SODIUM 100 MG PO CAPS
100.0000 mg | ORAL_CAPSULE | Freq: Two times a day (BID) | ORAL | Status: DC
  Administered 2015-02-28: 21:00:00 100 mg via ORAL
  Filled 2015-02-28: qty 1

## 2015-02-28 MED ORDER — HYDROMORPHONE HCL 1 MG/ML IJ SOLN
INTRAMUSCULAR | Status: AC
  Administered 2015-02-28: 4 mg
  Filled 2015-02-28: qty 4

## 2015-02-28 MED ORDER — MORPHINE SULFATE (PF) 2 MG/ML IV SOLN
1.0000 mg | Freq: Once | INTRAVENOUS | Status: AC
  Administered 2015-02-28: 1 mg via INTRAVENOUS
  Filled 2015-02-28: qty 1

## 2015-02-28 MED ORDER — POTASSIUM CHLORIDE 20 MEQ PO PACK
40.0000 meq | PACK | Freq: Once | ORAL | Status: AC
  Administered 2015-02-28: 40 meq via ORAL
  Filled 2015-02-28: qty 2

## 2015-02-28 MED ORDER — VANCOMYCIN HCL 10 G IV SOLR
1250.0000 mg | Freq: Two times a day (BID) | INTRAVENOUS | Status: DC
  Administered 2015-02-28: 1250 mg via INTRAVENOUS
  Filled 2015-02-28 (×2): qty 1250

## 2015-02-28 MED ORDER — HALOPERIDOL LACTATE 2 MG/ML PO CONC
0.5000 mg | ORAL | Status: DC | PRN
  Filled 2015-02-28: qty 0.3

## 2015-02-28 MED ORDER — FENTANYL 100 MCG/HR TD PT72
200.0000 ug | MEDICATED_PATCH | TRANSDERMAL | Status: DC
  Administered 2015-02-28 – 2015-03-03 (×2): 200 ug via TRANSDERMAL
  Filled 2015-02-28 (×2): qty 2

## 2015-02-28 MED ORDER — HYDROMORPHONE HCL 2 MG PO TABS
4.0000 mg | ORAL_TABLET | Freq: Two times a day (BID) | ORAL | Status: DC
  Administered 2015-02-28: 21:00:00 4 mg via ORAL
  Filled 2015-02-28 (×4): qty 2

## 2015-02-28 MED ORDER — LEVALBUTEROL HCL 1.25 MG/0.5ML IN NEBU
1.2500 mg | INHALATION_SOLUTION | Freq: Four times a day (QID) | RESPIRATORY_TRACT | Status: DC
  Administered 2015-02-28: 1.25 mg via RESPIRATORY_TRACT
  Filled 2015-02-28: qty 0.5

## 2015-02-28 MED ORDER — METHYLPREDNISOLONE SODIUM SUCC 125 MG IJ SOLR: 60.0000 mg | Freq: Four times a day (QID) | INTRAMUSCULAR | Status: DC

## 2015-02-28 MED ORDER — CHLORHEXIDINE GLUCONATE CLOTH 2 % EX PADS
6.0000 | MEDICATED_PAD | Freq: Every day | CUTANEOUS | Status: DC
  Administered 2015-02-28 – 2015-03-02 (×3): 6 via TOPICAL

## 2015-02-28 MED ORDER — METHYLPREDNISOLONE SODIUM SUCC 125 MG IJ SOLR: 60.0000 mg | Freq: Every day | INTRAMUSCULAR | Status: DC

## 2015-02-28 MED ORDER — SODIUM CHLORIDE 0.9% FLUSH
3.0000 mL | Freq: Two times a day (BID) | INTRAVENOUS | Status: DC
  Administered 2015-02-28 (×2): 3 mL via INTRAVENOUS

## 2015-02-28 MED ORDER — HYDROMORPHONE HCL 1 MG/ML IJ SOLN
3.0000 mg | INTRAMUSCULAR | Status: DC | PRN
  Administered 2015-02-28 – 2015-03-02 (×12): 3 mg via INTRAVENOUS
  Filled 2015-02-28 (×12): qty 3

## 2015-02-28 MED ORDER — ACETAMINOPHEN 650 MG RE SUPP: 650.0000 mg | Freq: Four times a day (QID) | RECTAL | Status: DC | PRN

## 2015-02-28 MED ORDER — INSULIN ASPART 100 UNIT/ML ~~LOC~~ SOLN
0.0000 [IU] | Freq: Three times a day (TID) | SUBCUTANEOUS | Status: DC
  Administered 2015-02-28 (×2): 3 [IU] via SUBCUTANEOUS
  Administered 2015-02-28: 17:00:00 2 [IU] via SUBCUTANEOUS
  Filled 2015-02-28: qty 2
  Filled 2015-02-28 (×2): qty 3

## 2015-02-28 MED ORDER — ACETAMINOPHEN 325 MG PO TABS: 650.0000 mg | ORAL_TABLET | Freq: Four times a day (QID) | ORAL | Status: DC | PRN

## 2015-02-28 MED ORDER — TEMAZEPAM 15 MG PO CAPS: 15.0000 mg | ORAL_CAPSULE | Freq: Every evening | ORAL | Status: DC | PRN

## 2015-02-28 MED ORDER — DIAZEPAM 5 MG/ML IJ SOLN
5.0000 mg | INTRAMUSCULAR | Status: DC | PRN
  Administered 2015-02-28 – 2015-03-01 (×5): 5 mg via INTRAVENOUS
  Filled 2015-02-28 (×6): qty 2

## 2015-02-28 MED ORDER — ALPRAZOLAM 0.5 MG PO TABS
0.5000 mg | ORAL_TABLET | Freq: Three times a day (TID) | ORAL | Status: DC | PRN
  Filled 2015-02-28: qty 1

## 2015-02-28 MED ORDER — ALBUMIN HUMAN 25 % IV SOLN
12.5000 g | Freq: Once | INTRAVENOUS | Status: AC
  Administered 2015-02-28: 12.5 g via INTRAVENOUS
  Filled 2015-02-28: qty 50

## 2015-02-28 MED ORDER — QUETIAPINE FUMARATE 100 MG PO TABS
100.0000 mg | ORAL_TABLET | Freq: Two times a day (BID) | ORAL | Status: DC
Start: 2015-02-28 — End: 2015-03-01
  Administered 2015-02-28 (×2): 100 mg via ORAL
  Filled 2015-02-28 (×2): qty 1

## 2015-02-28 MED ORDER — BUDESONIDE 0.5 MG/2ML IN SUSP: 0.5000 mg | Freq: Two times a day (BID) | RESPIRATORY_TRACT | Status: DC

## 2015-02-28 MED ORDER — HALOPERIDOL 0.5 MG PO TABS
0.5000 mg | ORAL_TABLET | ORAL | Status: DC | PRN
  Filled 2015-02-28: qty 1

## 2015-02-28 MED ORDER — LEVALBUTEROL HCL 1.25 MG/0.5ML IN NEBU: 1.2500 mg | INHALATION_SOLUTION | Freq: Four times a day (QID) | RESPIRATORY_TRACT | Status: DC

## 2015-02-28 MED ORDER — ONDANSETRON HCL 4 MG PO TABS: 4.0000 mg | ORAL_TABLET | Freq: Four times a day (QID) | ORAL | Status: DC | PRN

## 2015-02-28 MED ORDER — HALOPERIDOL LACTATE 5 MG/ML IJ SOLN
0.5000 mg | INTRAMUSCULAR | Status: DC | PRN
  Administered 2015-03-01: 0.5 mg via INTRAVENOUS
  Filled 2015-02-28: qty 1

## 2015-02-28 MED ORDER — PIPERACILLIN-TAZOBACTAM 3.375 G IVPB
3.3750 g | Freq: Three times a day (TID) | INTRAVENOUS | Status: DC
  Administered 2015-02-28: 3.375 g via INTRAVENOUS
  Filled 2015-02-28 (×3): qty 50

## 2015-02-28 MED ORDER — ONDANSETRON HCL 4 MG/2ML IJ SOLN
4.0000 mg | Freq: Four times a day (QID) | INTRAMUSCULAR | Status: DC | PRN
Start: 2015-02-28 — End: 2015-03-03

## 2015-02-28 MED ORDER — HALOPERIDOL LACTATE 5 MG/ML IJ SOLN
INTRAMUSCULAR | Status: AC
  Administered 2015-02-28: 16:00:00
  Filled 2015-02-28: qty 1

## 2015-02-28 MED ORDER — MORPHINE SULFATE (PF) 2 MG/ML IV SOLN: 2.0000 mg | INTRAVENOUS | Status: DC | PRN

## 2015-02-28 MED ORDER — METOPROLOL TARTRATE 25 MG PO TABS
12.5000 mg | ORAL_TABLET | Freq: Two times a day (BID) | ORAL | Status: DC
  Administered 2015-02-28 (×2): 12.5 mg via ORAL
  Filled 2015-02-28 (×2): qty 1

## 2015-02-28 MED ORDER — MUPIROCIN 2 % EX OINT
1.0000 "application " | TOPICAL_OINTMENT | Freq: Two times a day (BID) | CUTANEOUS | Status: DC
  Administered 2015-02-28 – 2015-03-01 (×4): 1 via NASAL
  Filled 2015-02-28 (×2): qty 22

## 2015-02-28 MED ORDER — FUROSEMIDE 10 MG/ML IJ SOLN
20.0000 mg | Freq: Once | INTRAMUSCULAR | Status: AC
  Administered 2015-02-28: 20 mg via INTRAVENOUS
  Filled 2015-02-28: qty 4

## 2015-02-28 MED ORDER — HYDROMORPHONE HCL 2 MG PO TABS
4.0000 mg | ORAL_TABLET | ORAL | Status: DC | PRN
  Administered 2015-02-28: 4 mg via ORAL

## 2015-02-28 MED ORDER — IPRATROPIUM-ALBUTEROL 0.5-2.5 (3) MG/3ML IN SOLN: 3.0000 mL | RESPIRATORY_TRACT | Status: DC

## 2015-02-28 NOTE — Progress Notes (Signed)
Pharmacy Antibiotic Note  [name] is a 55 y.o. female admitted on 02/14/2015 with pneumonia.  Pharmacy has been consulted for vancomycin and Zosyn dosing.  Plan: DW 68kg  Vd 48L kei 0.077 hr-1  T1/2 9 hours.  Vancomycin 1250 mg q 12 hours ordered with stacked dosing. Level before 5th dose. Goal 15-20.  Zosyn 3.375 grams q 8 hours ordered.  Height: '5\' 4"'$  (162.6 cm) Weight: 194 lb 1 oz (88.026 kg) IBW/kg (Calculated) : 54.7  Temp (24hrs), Avg:98.4 F (36.9 C), Min:98.4 F (36.9 C), Max:98.4 F (36.9 C)   Recent Labs Lab 02/13/2015 2128 02/20/2015 2129 02/28/15 0001  WBC 11.8*  --   --   CREATININE 0.56  --   --   LATICACIDVEN  --  3.0* 3.5*    Estimated Creatinine Clearance: 88.3 mL/min (by C-G formula based on Cr of 0.56).    Allergies  Allergen Reactions  . Lorazepam Other (See Comments)    Reaction Unknown Listed on Nursing Home MAR  . Tape Other (See Comments)    Reaction Unknown Listed on Nursing Home MAR  . Tegaderm Ag Mesh [Silver] Other (See Comments)    Reaction Unknown Listed on Nursing Home MAR    Antimicrobials this admission:   >>    >>   Dose adjustments this admission:   Microbiology results: 2/26 BCx: pending 2/27 UCx: pending   Sputum:   2/27 MRSA PCR: pending  UA: LE(+) NO2(+) WBC 6-30 CXR: pulmonary edema vs. tumor spread  Thank you for allowing pharmacy to be a part of this patient's care.  Kiandra Sanguinetti S 02/28/2015 3:02 AM

## 2015-02-28 NOTE — Progress Notes (Signed)
Inpatient Diabetes Program Recommendations  AACE/ADA: New Consensus Statement on Inpatient Glycemic Control (2015)  Target Ranges:  Prepandial:   less than 140 mg/dL      Peak postprandial:   less than 180 mg/dL (1-2 hours)      Critically ill patients:  140 - 180 mg/dL   Review of Glycemic Control:  Results for [name], [name] (MRN [id number]) as of 02/28/2015 12:10  Ref. Range 02/12/2015 21:58 02/28/2015 02:06 02/28/2015 07:54  Glucose-Capillary Latest Ref Range: 65-99 mg/dL 212 (H) 278 (H) 237 (H)   Diabetes history: Type 2 diabetes Outpatient Diabetes medications: Glipizide 5 mg daily, Decadron 4 mg bid, Humalog 75/25 10 units bid Current orders for Inpatient glycemic control:  Novolog sensitive tid with meals and HS  Inpatient Diabetes Program Recommendations:   May consider adding Lantus 12 units daily.  Also consider increasing Novolog correction to moderate tid with meals.  Thanks, Kristen Perl, RN, BC-ADM Inpatient Diabetes Coordinator Pager [phone] (8a-5p)

## 2015-02-28 NOTE — Progress Notes (Signed)
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 She is a DNR code. Hospital care team made aware. She was sent to the ED at request of her family for evaluation of decreased oxygen saturations, edema and mottling. She has been admitted with a  Diagnosis of respiratory failure w/hypoxia. Patient seen sitting in bed,alert, sister Henri at bedside. Patient alert and greeted Probation officer by name. Bipap was discontinued earlier this morning,at patient's request, she was then placed on 6 liters nasal cannula but oxygen sats decreased and she is currently on [place] nasal cannula at 35 liters. She took this off several times during visit. She also reported 8/10 pain to her right side. Staff RN Di Kindle made aware. Writer also reviewed patient's MAR from [place] with Di Kindle she will alert attending physician Dr. Lavetta Nielsen regarding changes/adjustments that need to made. Per conversation with Ardath Sax, patient's daughter [name] is making arrangements to come to [place] from [place] and may not arrive until tomorrow. Symptom management discussed with attending physician Dr. Lavetta Nielsen after this visit. Palliative Care consult pending. Will continue to follow and update hospice team. Thank you. Flo Shanks RN, BSN, [place] [facility] [place], [facility] [phone] c

## 2015-02-28 NOTE — Progress Notes (Signed)
[facility] [place] NAME: [name]    MR#:  [id number]  DATE OF BIRTH:  02-03-1960  SUBJECTIVE:  Rather unfortunate lady admitted last evening with respiratory failure She refuses BiPAP treatment, repeatedly takes off high flow oxygen and nasal cannula When asked how she feels surprisingly states okay  REVIEW OF SYSTEMS:  CONSTITUTIONAL: No fever, positive fatigue or weakness.  EYES: No blurred or double vision.  EARS, NOSE, AND THROAT: No tinnitus or ear pain.  RESPIRATORY: No cough, positive shortness of breath, denies wheezing or hemoptysis.  CARDIOVASCULAR: No chest pain, orthopnea, edema.  GASTROINTESTINAL: No nausea, vomiting, diarrhea or abdominal pain.  GENITOURINARY: No dysuria, hematuria.  ENDOCRINE: No polyuria, nocturia,  HEMATOLOGY: No anemia, easy bruising or bleeding SKIN: No rash or lesion. MUSCULOSKELETAL: No joint pain or arthritis.   NEUROLOGIC: No tingling, numbness, weakness.  PSYCHIATRY: No anxiety or depression.   DRUG ALLERGIES:   Allergies  Allergen Reactions  . Lorazepam Other (See Comments)    Reaction Unknown Listed on Nursing Home MAR  . Tape Other (See Comments)    Reaction Unknown Listed on Nursing Home MAR  . Tegaderm Ag Mesh [Silver] Other (See Comments)    Reaction Unknown Listed on Nursing Home MAR    VITALS:  Blood pressure 109/86, pulse 110, temperature 98 F (36.7 C), temperature source Oral, resp. rate 10, height '5\' 4"'$  (1.626 m), weight 88.026 kg (194 lb 1 oz), SpO2 91 %.  PHYSICAL EXAMINATION:  VITAL SIGNS: Filed Vitals:   02/28/15 1200 02/28/15 1300  BP: 119/73 109/86  Pulse:  110  Temp: 98 F (36.7 C)   Resp: 76 10   GENERAL:54 y.o.female currently moderate acute distress. Given her respiratory status, chronically ill-appearing HEAD: Normocephalic, atraumatic.  EYES: Pupils equal, round, reactive to light. Extraocular muscles intact. No scleral icterus.  MOUTH:  Moist mucosal membrane. Dentition intact. No abscess noted.  EAR, NOSE, THROAT: Clear without exudates. No external lesions.  NECK: Supple. No thyromegaly. No nodules. No JVD.  PULMONARY: Greatly diminished breath sounds without wheeze rails or rhonci. No use of accessory muscles, Good respiratory effort. good air entry bilaterally CHEST: Nontender to palpation.  CARDIOVASCULAR: S1 and S2. Regular rate and rhythm. No murmurs, rubs, or gallops. 1+ edema. Pedal pulses 2+ bilaterally.  GASTROINTESTINAL: Soft, nontender, nondistended. No masses. Positive bowel sounds. No hepatosplenomegaly.  MUSCULOSKELETAL: No swelling, clubbing, or edema. Range of motion full in all extremities.  NEUROLOGIC: Cranial nerves II through XII are intact. No gross focal neurological deficits. Sensation intact. Reflexes intact.  SKIN: Lower extremities wrapped secondary to ulceration, otherwise no lesions, rashes, or cyanosis. Skin warm and dry. Turgor intact.  PSYCHIATRIC: Mood, affect within normal limits. The patient is awake, alert and oriented x 3. Insight, judgment intact.      LABORATORY PANEL:   CBC  Recent Labs Lab 02/28/15 0534  WBC 8.2  HGB 10.2*  HCT 32.3*  PLT 109*   ------------------------------------------------------------------------------------------------------------------  Chemistries   Recent Labs Lab 02/04/2015 2128 02/28/15 0534  NA 134* 137  K 4.9 3.2*  CL 102 101  CO2 22 25  GLUCOSE 220* 283*  BUN 21* 21*  CREATININE 0.56 0.60  CALCIUM 8.0* 7.7*  MG  --  1.9  AST 26  --   ALT 31  --   ALKPHOS 86  --   BILITOT 0.7  --    ------------------------------------------------------------------------------------------------------------------  Cardiac Enzymes No results for input(s): TROPONINI in the last 168 hours. ------------------------------------------------------------------------------------------------------------------  RADIOLOGY:  Dg Chest Portable 1  View  02/07/2015  CLINICAL DATA:  55 year old female with respiratory distress from nursing home. History of pneumonia and lung cancer. EXAM: PORTABLE CHEST 1 VIEW COMPARISON:  Radiograph dated 08/13/2014 FINDINGS: Single portable view of the chest demonstrate diffuse reticular and nodular interstitial prominence. There are patchy areas of airspace opacity in the perihilar region as well as in the right lung base, new from prior study. Differentials include worsening spread of tumor, pneumonia, or pulmonary edema. Clinical correlation and follow-up recommended. There is no pneumothorax. Surgical suture material noted in the right upper lung field. Right pectoral Port-A-Cath with tip over central SVC noted. No acute osseous pathology. IMPRESSION: Diffuse reticulonodular interstitial densities with new areas of airspace opacities. Findings concerning for pneumonia or worsening pulmonary edema on possible underlying lymphangitic spread of tumor. Electronically Signed   By: Anner Crete M.D.   On: 02/06/2015 22:27    EKG:   Orders placed or performed during the hospital encounter of 02/12/2015  . EKG 12-Lead  . EKG 12-Lead  . ED EKG  . 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  1. Acute on chronic respiratory failure with hypoxia: Patient currently does not want BiPAP treatment however her wishes are somewhat confusing stating "if it helps me I will do it" at the same time she self discontinued BiPAP as well as oxygen. In the meantime continue supplemental oxygen as required to keep O2 saturations greater than 88%, breathing treatments, antibiotics for presumptive infection. 2. Type 2 diabetes non-insulin-requiring: Hold oral agents at insulin sliding scale 3. Essential hypertension: Lopressor 4. GERD without esophagitis PPI therapy 5. History of DVT:  Eliquis  Disposition: Discussed with hospice nurse, patient, patient's sister at bedside. The patient's daughter will be present likely tomorrow further discussions. She is preceded and followed by home hospice. She is somewhat unclear of her wishes she is a DO NOT RESUSCITATE I discussed the options of symptom management and comfort measures. I'll continue this discussion as well as consult height of care  All the records are reviewed and case discussed with Care Management/Social Workerr. Management plans discussed with the patient, family and they are in agreement.  CODE STATUS: DO NOT RESUSCITATE  TOTAL TIME TAKING CARE OF THIS PATIENT: 45 minutes.   POSSIBLE D/C IN 3-4 DAYS, DEPENDING ON CLINICAL CONDITION.   Hower,  Karenann Cai.D on 02/28/2015 at 2:18 PM  Between 7am to 6pm - Pager - [phone]  After 6pm: House Pager: - [phone]  [facility]  Office  [phone]  CC: Primary care physician; Pcp Not In System

## 2015-02-28 NOTE — ED Notes (Signed)
Lactic acid critical value

## 2015-02-28 NOTE — Progress Notes (Signed)
Santiago Glad - hospice at bedside, patietn sister at bedside  Patient states "i can't do this anymore and I don't want this" In regards to oxygen and health care  i offered comfort measures, pain meds - she agreed Given high tolerance - dilaudid '3mg'$  prn ordered

## 2015-02-28 NOTE — Care Management (Signed)
Patient is currently followed by [facility] and agency is aware of admission.  Patient presented with hypoxia and it ds documented she presents from a facility.  Will clarify with hospice staff. She is currently a DNR.  She is declining bipap and is on 6 liters nasal cannula

## 2015-02-28 NOTE — Progress Notes (Signed)
[facility] Progress Note Patient Name: [name] DOB: 01/09/1960 MRN: [id number]   Date of Service  02/28/2015  HPI/Events of Note  Nurse called as pts on bipap and cant take PO meds. Pt with resp failure, st IV lung cancer, DNR  eICU Interventions  Morphine 1 mg x 1 ordered and lopressor 5 mg IV x 1. Both IV.  Suggest switching to prn IV morphine from PO dilaudid and IV metoprolol from PO lopressor in am.      Intervention Category Major Interventions: Other:  [place] 02/28/2015, 2:54 AM

## 2015-02-28 NOTE — Progress Notes (Signed)
Patient states she wants the bipap mask taken off, stating "it's her choice". Dr. Marcille Blanco was notified and ordered for patient to be placed on [place]. Patient placed on 6L [place]. O2 sats dropped immediately into the 80s but patient does not appears to be in distress. Patient appears satisfied with the  and states she "does not want to wear any masks; she doesn't want any of this". Nursing to continue monitoring.

## 2015-02-28 NOTE — Progress Notes (Signed)
Writer made a follow up visit after seeing patient's sister Henri in the hall, patient seen very restless, repeatedly saying "I cant do this, i cant do this anymore", she took off her [place] oxygen and declined to have ot replaced, she also declined a nasal cannula. Attending physician Dr. Lavetta Nielsen paged and came to the patient room, orders given for PRN dilaudid, haldol and in addition  PRN valium. Writer and patient's sister remained with her, patient calmer and appeared to be resting at end of visit. Oxygen saturations 59%. Per Dr. Lavetta Nielsen patient will be made comfort care. Henri in agreement. Dr. Lavetta Nielsen to contact patient's daughter [name] who is on her way from [place].Writer to update hospice team. Flo Shanks RN, BSN, [place] [facility] [place], [facility] [phone]

## 2015-02-28 NOTE — Progress Notes (Signed)
Pt tx 1C epr MD order, pt stable, pt on high flow O2, pt verbalized understanding of tx, pt family at [facility], pt's daughter updated, report called to receiving RN, all questions answered

## 2015-02-28 NOTE — H&P (Addendum)
[facility] at [place] NAME: [name]    MR#:  [id number]  DATE OF BIRTH:  [birth year]  DATE OF ADMISSION:  02/18/2015  PRIMARY CARE PHYSICIAN: Pcp Not In System   REQUESTING/REFERRING PHYSICIAN: Jacqualine Code, MD  CHIEF COMPLAINT:   Chief Complaint  Patient presents with  . Respiratory Distress    HISTORY OF PRESENT ILLNESS:  [name]  is a 55 y.o. female who presents with respiratory failure. Patient presents from nursing facility where she was reported to have oxygen saturations in the 50% range. Her O2 sats were reportedly in the 70s via EMS and on arrival in the ED. Chest x-ray here showed diffuse infiltrates in bilateral lungs concerning for interstitial edema versus infectious infiltrate versus possible lymphangitic spread of her cancer. Patient was placed on BiPAP and has had good respiratory response, with O2 sats maintaining in the mid to upper 90s at this time. Patient is alert, though she is unable to provide much information to her history and is somewhat intermittently confused. Family is present and provides much of the history. Patient was started on broad-spectrum antibiotics and hospitalists were called for admission.  PAST MEDICAL HISTORY:   Past Medical History  Diagnosis Date  . Pneumonia   . Lung cancer ([place])   . DVT (deep venous thrombosis) ([place])   . Urinary incontinence   . HTN (hypertension)   . Chronic diastolic congestive heart failure ([place])   . Type 2 diabetes mellitus ([place]) 02/28/2015    PAST SURGICAL HISTORY:   Past Surgical History  Procedure Laterality Date  . Video bronchoscopy Bilateral 01/14/2013    Procedure: VIDEO BRONCHOSCOPY WITHOUT FLUORO;  Surgeon: Wilhelmina Mcardle, MD;  Location[facility] END[facility]COPY;  Service: Cardiopulmonary;  Laterality: Bilateral;  . Video bronchoscopy N/A 01/27/2013    Procedure: VIDEO BRONCHOSCOPY;  Surgeon: Ivin Poot, MD;  Location: [place];  Service: Thoracic;   Laterality: N/A;  . Video assisted thoracoscopy Right 01/27/2013    Procedure: VIDEO ASSISTED THORACOSCOPY;  Surgeon: Ivin Poot, MD;  Location: [place];  Service: Thoracic;  Laterality: Right;  . Lung biopsy Right 01/27/2013    Procedure: LUNG BIOPSY;  Surgeon: Ivin Poot, MD;  Location: [place];  Service: Thoracic;  Laterality: Right;  . Pericardial tap N/A 01/11/2013    Procedure: PERICARDIAL TAP;  Surgeon: Blane Ohara, MD;  Location[facility] CATH LAB;  Service: Cardiovascular;  Laterality: N/A;  . Bladder surgery      SOCIAL HISTORY:   Social History  Substance Use Topics  . Smoking status: Former Research scientist (life sciences)  . Smokeless tobacco: Never Used  . Alcohol Use: Yes     Comment: occasionally has a drink    FAMILY HISTORY:   Family History  Problem Relation Age of Onset  . Coronary artery disease Mother 49  . Lung cancer Mother   . Prostate cancer Father   . Breast cancer Mother   . Heart attack Mother     DRUG ALLERGIES:   Allergies  Allergen Reactions  . Lorazepam Other (See Comments)    Reaction Unknown Listed on Nursing Home MAR  . Tape Other (See Comments)    Reaction Unknown Listed on Nursing Home MAR  . Tegaderm Ag Mesh [Silver] Other (See Comments)    Reaction Unknown Listed on Nursing Home [facility]    MEDICATIONS AT HOME:   Prior to Admission medications   Medication Sig Start Date End Date Taking? Authorizing Provider  ALPRAZolam Duanne Moron)  0.5 MG tablet Take 0.5 mg by mouth 3 (three) times daily as needed for anxiety.   Yes Historical Provider, MD  apixaban (ELIQUIS) 5 MG TABS tablet Take 1 tablet (5 mg total) by mouth 2 (two) times daily. 05/25/14  Yes Epifanio Lesches, MD  dexamethasone (DECADRON) 4 MG tablet Take 4 mg by mouth 2 (two) times daily. 07/23/14  Yes Historical Provider, MD  fentaNYL (DURAGESIC - DOSED MCG/HR) 100 MCG/HR Place 2 patches (200 mcg total) onto the skin every 3 (three) days. 09/10/14  Yes Lloyd Huger, MD  furosemide (LASIX) 40 MG  tablet Take 1 tablet (40 mg total) by mouth 2 (two) times daily. Patient taking differently: Take 40 mg by mouth daily.  06/08/14  Yes Lloyd Huger, MD  gabapentin (NEURONTIN) 300 MG capsule Take 1 capsule (300 mg total) by mouth 3 (three) times daily. 05/20/14  Yes Lloyd Huger, MD  glipiZIDE (GLUCOTROL) 5 MG tablet Take 5 mg by mouth daily.   Yes Historical Provider, MD  haloperidol (HALDOL) 1 MG tablet Take 1 mg by mouth 2 (two) times daily.   Yes Historical Provider, MD  HYDROmorphone (DILAUDID) 4 MG tablet Take 1 tablet (4 mg total) by mouth every 4 (four) hours as needed for severe pain. Patient taking differently: Take 4 mg by mouth 2 (two) times daily.  08/10/14  Yes Lloyd Huger, MD  insulin lispro protamine-lispro (HUMALOG 75/25 MIX) (75-25) 100 UNIT/ML SUSP injection Inject 10 Units into the skin 2 (two) times daily with a meal.   Yes Historical Provider, MD  metoprolol tartrate (LOPRESSOR) 25 MG tablet Take 0.5 tablets (12.5 mg total) by mouth 2 (two) times daily. 06/28/14  Yes Demetrios Loll, MD  omeprazole (PRILOSEC) 20 MG capsule Take 20 mg by mouth daily.   Yes Historical Provider, MD  potassium chloride SA (K-DUR,KLOR-CON) 20 MEQ tablet Take 1 tablet (20 mEq total) by mouth 2 (two) times daily. 05/20/14  Yes Lloyd Huger, MD  QUEtiapine (SEROQUEL) 100 MG tablet Take 100 mg by mouth 2 (two) times daily.   Yes Historical Provider, MD  sennosides-docusate sodium (SENOKOT-S) 8.6-50 MG tablet Take 1-3 tablets by mouth 2 (two) times daily.   Yes Historical Provider, MD  temazepam (RESTORIL) 15 MG capsule Take 15-30 mg by mouth at bedtime as needed for sleep.   Yes Historical Provider, MD  Ipratropium-Albuterol (COMBIVENT) 20-100 MCG/ACT AERS respimat Inhale 1 puff into the lungs 4 (four) times daily as needed. For shortness of breath and wheezing Patient not taking: Reported on 02/26/2015 05/20/14   Lloyd Huger, MD  LIDODERM 5 % Place 1 patch onto the skin daily. Patient  not taking: Reported on 02/08/2015 08/04/14   Lloyd Huger, MD  predniSONE (DELTASONE) 5 MG tablet Take 2 tablets (10 mg total) by mouth daily with breakfast. 10 mg po daily for 2 days, 5 mg po daily for 2 days. Patient not taking: Reported on 02/08/2015 06/28/14   Demetrios Loll, MD  promethazine (PHENERGAN) 25 MG tablet Take 1 tablet (25 mg total) by mouth every 6 (six) hours as needed for nausea or vomiting. Patient not taking: Reported on 02/28/2015 08/24/14   Lloyd Huger, MD  scopolamine (TRANSDERM-SCOP) 1 MG/3DAYS Place 1 patch (1.5 mg total) onto the skin every 3 (three) days. Patient not taking: Reported on 02/14/2015 08/04/14   Lloyd Huger, MD  vancomycin (VANCOCIN) 1 GM/200ML SOLN Inject 200 mLs (1,000 mg total) into the vein every 12 (twelve) hours. Patient  not taking: Reported on 02/17/2015 08/17/14   Hillary Bow, MD    REVIEW OF SYSTEMS:  Review of Systems  Constitutional: Positive for malaise/fatigue. Negative for fever, chills and weight loss.  HENT: Negative for ear pain, hearing loss and tinnitus.   Eyes: Negative for blurred vision, double vision, pain and redness.  Respiratory: Positive for cough and shortness of breath. Negative for hemoptysis.   Cardiovascular: Positive for leg swelling. Negative for chest pain, palpitations and orthopnea.  Gastrointestinal: Negative for nausea, vomiting, abdominal pain, diarrhea and constipation.  Genitourinary: Negative for dysuria, frequency and hematuria.  Musculoskeletal: Negative for back pain, joint pain and neck pain.  Skin:       No acne, rash, or lesions  Neurological: Negative for dizziness, tremors, focal weakness and weakness.  Endo/Heme/Allergies: Negative for polydipsia. Does not bruise/bleed easily.  Psychiatric/Behavioral: Negative for depression. The patient is not nervous/anxious and does not have insomnia.      VITAL SIGNS:   Filed Vitals:   02/25/2015 2315 02/12/2015 2330 02/21/2015 2345 02/28/15 0000  BP:   114/94  135/97  Pulse: 116 120 126 122  Resp: '9 11 21 30  '$ Height:      Weight:      SpO2: 98% 99% 97% 97%   Wt Readings from Last 3 Encounters:  02/24/2015 88.026 kg (194 lb 1 oz)  08/17/14 71.895 kg (158 lb 8 oz)  06/28/14 70.988 kg (156 lb 8 oz)    PHYSICAL EXAMINATION:  Physical Exam  Vitals reviewed. Constitutional: She appears well-developed and well-nourished. No distress.  HENT:  Head: Normocephalic and atraumatic.  Mouth/Throat: Oropharynx is clear and moist.  Eyes: Conjunctivae and EOM are normal. Pupils are equal, round, and reactive to light. No scleral icterus.  Neck: Normal range of motion. Neck supple. No JVD present. No thyromegaly present.  Cardiovascular: Regular rhythm and intact distal pulses.  Exam reveals no gallop and no friction rub.   No murmur heard. Tachycardic  Respiratory: She is in respiratory distress. She has no wheezes. She has rales.  GI: Soft. Bowel sounds are normal. She exhibits distension. There is no tenderness.  Musculoskeletal: Normal range of motion. She exhibits edema (3+ diffuse).  No arthritis, no gout  Lymphadenopathy:    She has no cervical adenopathy.  Neurological: She is alert. No cranial nerve deficit.  Unable to fully assess due to critical condition  Skin: Skin is warm and dry. No rash noted. No erythema.  Psychiatric:  Unable to assess due to critical condition    LABORATORY PANEL:   CBC  Recent Labs Lab 02/24/2015 2128  WBC 11.8*  HGB 10.8*  HCT 34.9*  PLT 140*   ------------------------------------------------------------------------------------------------------------------  Chemistries   Recent Labs Lab 02/02/2015 2128  NA 134*  K 4.9  CL 102  CO2 22  GLUCOSE 220*  BUN 21*  CREATININE 0.56  CALCIUM 8.0*  AST 26  ALT 31  ALKPHOS 86  BILITOT 0.7   ------------------------------------------------------------------------------------------------------------------  Cardiac Enzymes No results for  input(s): TROPONINI in the last 168 hours. ------------------------------------------------------------------------------------------------------------------  RADIOLOGY:  Dg Chest Portable 1 View  02/13/2015  CLINICAL DATA:  55 year old female with respiratory distress from nursing home. History of pneumonia and lung cancer. EXAM: PORTABLE CHEST 1 VIEW COMPARISON:  Radiograph dated 08/13/2014 FINDINGS: Single portable view of the chest demonstrate diffuse reticular and nodular interstitial prominence. There are patchy areas of airspace opacity in the perihilar region as well as in the right lung base, new from prior study. Differentials include worsening  spread of tumor, pneumonia, or pulmonary edema. Clinical correlation and follow-up recommended. There is no pneumothorax. Surgical suture material noted in the right upper lung field. Right pectoral Port-A-Cath with tip over central SVC noted. No acute osseous pathology. IMPRESSION: Diffuse reticulonodular interstitial densities with new areas of airspace opacities. Findings concerning for pneumonia or worsening pulmonary edema on possible underlying lymphangitic spread of tumor. Electronically Signed   By: Anner Crete M.D.   On: 02/08/2015 22:27    EKG:   Orders placed or performed during the hospital encounter of 02/19/2015  . EKG 12-Lead  . EKG 12-Lead  . ED EKG  . ED EKG    IMPRESSION AND PLAN:  Principal Problem:   Acute respiratory failure with hypoxemia (HCC) - patient's history status post stabilized somewhat on BiPAP. She is DO NOT RESUSCITATE/DO NOT INTUBATE, and this was reconfirmed tonight with patient and family. Unclear what the exact etiology of her pulmonary findings as of this time, the suspicion for pneumonia and/or heart failure exacerbation with the possibility of lymphangitic spread of her cancer are primary differential diagnoses. Continue BiPAP for now, wean as tolerated, see below for treatment of other  conditions. Active Problems:   Pulmonary infiltrates - we'll cover with broad antibiotics at this time for any possible infectious etiology, will also give some IV diuresis as her blood pressure tolerates, though we will do this gently as the patient's lactic acid is elevated. Unclear if her lactic acidosis is due to poor tissue perfusion and oxygenation from her hypoxia or from hypovolemia, that her blood pressure is low and normotensive. Patient does have diffuse anasarca, which could be creating some third spacing also leading to poor perfusion.   Adenocarcinoma of lung, stage 4 ([place]) - patient opted some time ago to not undergo any further treatment, it is possible that her respiratory distress and pulmonary findings are due to spread of her cancer.   Chronic diastolic congestive heart failure (HCC) - gentle IV diuresis at this time with close monitoring of her blood pressure.   HTN (hypertension) - hold home antihypertensives as patient is low and normotensive currently.   Type 2 diabetes mellitus (HCC) - sliding scale insulin with corresponding glucose checks and carb modified diet  All the records are reviewed and case discussed with ED provider. Management plans discussed with the patient and/or family.  DVT PROPHYLAXIS: Systemic anticoagulation  GI PROPHYLAXIS: PPI  ADMISSION STATUS: Inpatient  CODE STATUS: DNR Code Status History    Date Active Date Inactive Code Status Order ID Comments User Context   08/13/2014  7:36 PM 08/17/2014  2:23 PM Full Code [id number]  Joanne Gavel, RN Inpatient   08/13/2014  1:24 PM 08/13/2014  7:36 PM DNR [id number]  Hillary Bow, MD Inpatient   08/13/2014  3:21 AM 08/13/2014  1:24 PM Full Code [id number]  Harrie Foreman, MD ED   06/25/2014 12:32 PM 06/28/2014  5:14 PM DNR [id number]  Flora Lipps, MD Inpatient   06/22/2014  5:57 AM 06/25/2014 12:32 PM Full Code [id number]  Lance Coon, MD Inpatient   05/16/2014  1:07 PM 05/19/2014  3:00 PM Full Code  [id number]  Vaughan Basta, MD Inpatient   01/27/2013  6:27 PM 02/02/2013  9:16 PM Full Code [id number]  Ellwood Handler, PA-C Inpatient   01/11/2013  1:36 PM 01/27/2013  6:27 PM Full Code [id number]  Sherren Mocha, MD Inpatient      TOTAL CRITICAL CARE TIME TAKING CARE OF THIS PATIENT: 50 minutes.  [name] Willard 02/28/2015, 12:26 AM  [facility]  Office  [phone]  CC: Primary care physician; Pcp Not In System

## 2015-03-01 DIAGNOSIS — I10 Essential (primary) hypertension: Secondary | ICD-10-CM

## 2015-03-01 DIAGNOSIS — I509 Heart failure, unspecified: Secondary | ICD-10-CM

## 2015-03-01 DIAGNOSIS — J9621 Acute and chronic respiratory failure with hypoxia: Principal | ICD-10-CM

## 2015-03-01 DIAGNOSIS — Z87891 Personal history of nicotine dependence: Secondary | ICD-10-CM

## 2015-03-01 DIAGNOSIS — C3490 Malignant neoplasm of unspecified part of unspecified bronchus or lung: Secondary | ICD-10-CM

## 2015-03-01 DIAGNOSIS — E119 Type 2 diabetes mellitus without complications: Secondary | ICD-10-CM

## 2015-03-01 LAB — URINE CULTURE

## 2015-03-01 MED ORDER — SODIUM CHLORIDE 0.9 % IV SOLN
4.0000 mg/h | INTRAVENOUS | Status: DC
  Filled 2015-03-01: qty 2.5

## 2015-03-01 MED ORDER — DIAZEPAM 5 MG/ML IJ SOLN
10.0000 mg | INTRAMUSCULAR | Status: DC | PRN
  Administered 2015-03-01 – 2015-03-03 (×12): 10 mg via INTRAVENOUS
  Filled 2015-03-01 (×11): qty 2

## 2015-03-01 MED ORDER — DEXTROSE 5 % IV SOLN
15.0000 mg/h | INTRAVENOUS | Status: DC
  Administered 2015-03-01: 5 mg/h via INTRAVENOUS
  Administered 2015-03-02: 10 mg/h via INTRAVENOUS
  Filled 2015-03-01 (×2): qty 10

## 2015-03-01 NOTE — Clinical Social Work Note (Signed)
Clinical Social Work Assessment  Patient Details  Name: [name] MRN: [id number] Date of Birth: 22-Nov-1960  Date of referral:  03/01/15               Reason for consult:  Facility Placement                Permission sought to share information with:  Family Supports Permission granted to share information::  Yes, Verbal Permission Granted  Name::     England,Henrietta B, sister in law   Housing/Transportation Living arrangements for the past 2 months:  [place] of Information:  Patient Patient Interpreter Needed:  None Criminal Activity/Legal Involvement Pertinent to Current Situation/Hospitalization:  No - Comment as needed Significant Relationships:  Adult Children Lives with:  Facility Resident Do you feel safe going back to the place where you live?  Yes Need for family participation in patient care:  Yes (Comment)  Care giving concerns:  No care giving concerns  Social Worker assessment / plan:  CSW consulted for pt as she was admitted from [facility]. Pt is followed by Hospice and [place]. Pt has been made comfort care during this admission. CSW met with pt and family to provide support. CSW provided supportive counseling. CSW will continue to follow.  Employment status:  Disabled (Comment on whether or not currently receiving Disability) Insurance information:  Medicaid In [place] PT Recommendations:  Not assessed at this time Information / Referral to community resources:  Other (Comment Required) ([place])  Patient/Family's Response to care:  Pt and family were appreciative of CSW support.   Patient/Family's Understanding of and Emotional Response to Diagnosis, Current Treatment, and Prognosis:  Palliative Care consult pending.  Emotional Assessment Appearance:  Appears older than stated age Attitude/Demeanor/Rapport:  Other Affect (typically observed):  Anxious Orientation:  Oriented to Self Alcohol /  Substance use:  Never Used Psych involvement (Current and /or in the community):  No (Comment)  Discharge Needs  Concerns to be addressed:  Adjustment to Illness Readmission within the last 30 days:  No Current discharge risk:  Chronically ill Barriers to Discharge:  Continued Medical Work up   Darden Dates, LCSW 03/01/2015, 4:35 PM

## 2015-03-01 NOTE — Progress Notes (Signed)
Nutrition Brief Note  Chart reviewed.  Pt reviewed for pressure ulcer, stage II per policy. Pt now transitioning to comfort care.  Pt remains on Heart Healthy diet order, would recommend liberalizing to Regular diet. No further nutrition interventions warranted at this time.  Please re-consult as needed.   Dwyane Luo, RD, LDN Pager [phone] Weekend/On-Call Pager [phone]

## 2015-03-01 NOTE — Progress Notes (Signed)
Visit made. Patient seen sitting up in bed, sister in law and brother at bedside. Oxygen  In place at 5 liters via nasal cannula.  Patient has been started on a continuous morphine drip for treatment of increased work of breathing and pain, with PRN dilaudid '3mg'$  Q 1 hr. Valium has been increased to 10 mg q 1 hour for restlessness. She appeared restless and reported pain to her right side. Staff RN [place] notified. Palliative Medicine physician Dr. Megan Salon to meet with family this afternoon after patient's daughter arrives. Emotional support provided. Will continue to follow and update hospice team. Flo Shanks RN, BSN, [place] [facility] [place], [place] Liaison [phone] c

## 2015-03-01 NOTE — Progress Notes (Signed)
[place] at [place] NAME: Dajanique Robley    MR#:  [id number]  DATE OF BIRTH:  1960/07/15  SUBJECTIVE:  Resting comfortably Had some agitation overnight Per conversations last evening she is negative for measures only multiple family present at bedside this morning  REVIEW OF SYSTEMS:  Unable to obtain this time given patient's mental status medical condition  DRUG ALLERGIES:   Allergies  Allergen Reactions  . Lorazepam Other (See Comments)    Reaction Unknown Listed on Nursing Home MAR  . Tape Other (See Comments)    Reaction Unknown Listed on Nursing Home MAR  . Tegaderm Ag Mesh [Silver] Other (See Comments)    Reaction Unknown Listed on Nursing Home MAR    VITALS:  Blood pressure 108/66, pulse 128, temperature 97.6 F (36.4 C), temperature source Oral, resp. rate 20, height '5\' 4"'$  (1.626 m), weight 80.876 kg (178 lb 4.8 oz), SpO2 74 %.  PHYSICAL EXAMINATION:  VITAL SIGNS: Filed Vitals:   02/28/15 1424 03/01/15 0513  BP: 104/79 108/66  Pulse: 119 128  Temp: 98 F (36.7 C) 97.6 F (36.4 C)  Resp: 23    GENERAL:55 y.o.female currently critically ill HEAD: Normocephalic, atraumatic.  EYES: Pupils equal, round, reactive to light. Extraocular muscles intact. No scleral icterus.  MOUTH: Dry mucosal membrane. Dentition intact. No abscess noted.  EAR, NOSE, THROAT: Clear without exudates. No external lesions.  NECK: Supple. No thyromegaly. No nodules. No JVD.  PULMONARY: Diffuse coarse breath sounds, without wheeze rails or rhonci. No use of accessory muscles, poor respiratory effort. Poor air entry bilaterally CHEST: Nontender to palpation.  CARDIOVASCULAR: S1 and S2. Regular rate and rhythm. No murmurs, rubs, or gallops. No edema. Pedal pulses 2+ bilaterally.  GASTROINTESTINAL: Soft, nontender, nondistended. No masses. Positive bowel sounds. No hepatosplenomegaly.  MUSCULOSKELETAL: No swelling, clubbing, or edema. Range  of motion full in all extremities.  NEUROLOGIC: Unable to fully assess given mental status SKIN: Sacral ulcer present on admission otherwise No ulceration, lesions, rashes, or cyanosis. Skin warm and dry. Turgor intact.  PSYCHIATRIC: Currently resting comfortably unable to fully assess given mental status     LABORATORY PANEL:   CBC  Recent Labs Lab 02/28/15 0534  WBC 8.2  HGB 10.2*  HCT 32.3*  PLT 109*   ------------------------------------------------------------------------------------------------------------------  Chemistries   Recent Labs Lab 02/02/2015 2128 02/28/15 0534  NA 134* 137  K 4.9 3.2*  CL 102 101  CO2 22 25  GLUCOSE 220* 283*  BUN 21* 21*  CREATININE 0.56 0.60  CALCIUM 8.0* 7.7*  MG  --  1.9  AST 26  --   ALT 31  --   ALKPHOS 86  --   BILITOT 0.7  --    ------------------------------------------------------------------------------------------------------------------  Cardiac Enzymes No results for input(s): TROPONINI in the last 168 hours. ------------------------------------------------------------------------------------------------------------------  RADIOLOGY:  Dg Chest Portable 1 View  02/20/2015  CLINICAL DATA:  55 year old female with respiratory distress from nursing home. History of pneumonia and lung cancer. EXAM: PORTABLE CHEST 1 VIEW COMPARISON:  Radiograph dated 08/13/2014 FINDINGS: Single portable view of the chest demonstrate diffuse reticular and nodular interstitial prominence. There are patchy areas of airspace opacity in the perihilar region as well as in the right lung base, new from prior study. Differentials include worsening spread of tumor, pneumonia, or pulmonary edema. Clinical correlation and follow-up recommended. There is no pneumothorax. Surgical suture material noted in the right upper lung field. Right pectoral Port-A-Cath with tip over central SVC  noted. No acute osseous pathology. IMPRESSION: Diffuse reticulonodular  interstitial densities with new areas of airspace opacities. Findings concerning for pneumonia or worsening pulmonary edema on possible underlying lymphangitic spread of tumor. Electronically Signed   By: Anner Crete M.D.   On: 02/25/2015 22:27    EKG:   Orders placed or performed during the hospital encounter of 02/23/2015  . EKG 12-Lead  . EKG 12-Lead  . ED EKG  . ED EKG    ASSESSMENT AND PLAN:   55 year old Caucasian female history of stage IV lung cancer no metastases who was admitted 02/02/2015 with worsening rest or status. During the course of hospitalization she decided that she no longer would pursue aggressive measures and she has been made comfort measures only.  1. Acute on chronic respiratory failure with hypoxia 2. Stage IV adenocarcinoma of the lung 3. Type 2 diabetes insulin requiring   Comfort measures only We'll add continuous infusion of pain medications she's been getting almost 3 mg IV Dilaudid every hour, I have been informed by pharmacy did not carry continuous infusion followed at we'll transition over to IV morphine continue with when necessary pushes. Case discussed with hospice nurse as well as palliative care  We will need further medication adjustments however currently working on potential hospice placement  Multiple family members present at bedside including sister, brother, mother    All the records are reviewed and case discussed with Care Management/Social Workerr. Management plans discussed with the patient, family and they are in agreement.  CODE STATUS: DO NOT RESUSCITATE/comfort measures only  TOTAL TIME TAKING CARE OF THIS PATIENT: 28 minutes.   POSSIBLE D/C IN 1 DAYS, DEPENDING ON CLINICAL CONDITION.   Hower,  Karenann Cai.D on 03/01/2015 at 1:28 PM  Between 7am to 6pm - Pager - [phone]  After 6pm: House Pager: - [phone]  [facility]  Office  [phone]  CC: Primary care physician; Pcp Not In  System

## 2015-03-01 NOTE — Progress Notes (Signed)
Palliative Care Update  Palliative Care Consult initiated.  I have spoken with Dr. Lavetta Nielsen and with pt's sister in law.  I will meet with pts daughter at around 3 pm   Meds are being adjusted.  Full note to follow.  Colleen Can, MD

## 2015-03-01 NOTE — Consult Note (Signed)
Palliative Medicine Inpatient Consult Note   Name: [name] Date: 03/01/2015 MRN: [id number]  DOB: [birth year]  Referring Physician: Lytle Butte, MD  Palliative Care consult requested for this 55 y.o. female for goals of medical therapy in patient with metastatic lung cancer.  TODAY'S DISCUSSIONS AND DECISIONS: 1.  Pt is DNR.  She is an active pt followed by [facility]C at a facility.  2.  Relatives are present. Daughter, [name], who is HCPOA, is on her way by car and won't arrive in time for me to talk with today.  I did speak with other relatives present. 3.  They do not want her returning to the facility.  Preference is for Hospice Home.   4.  Pt is still having terminal restlessness that is not quite controlled. She is better than yesterday, but still needs more control. While here, Valium may work best.  She does not, however, have a clear allergy to Lorazepam, so we may also use this Rx. 5.  She is now on a morphine drip --no true morphine allergy. Doing pretty well on this in terms of pain mgmt (in addition to Fentanyl). Still awake much of the time. Family feels they have to be with her constantly due to her restlessness.  Will assist with symptom mgmt.   6.  She seems appropriate for Hospice Home --but we need to be sure she is stable for transfer and also confirm that she is appropriate by re-assessing her after her restlessness is stabilized.  Too unstable with symptoms today to consider any kind of transfer anywhere.     IMPRESSION: Acute on Chronic Respiratory Failure with hypoxia ---Has been on Bipap here Widely metastatic stage 4 lung cancer  (adenocarcinoma) ---not undergoing any further treatment  H/o pneumonia H/o DVt HTN Chronic Diastolic CHF Former Smoker Pulmonary infiltrates (pneumonia vs spread of cancer) DM2 Urinary Incontinence Malnutrition due to cancer   REVIEW OF SYSTEMS:  Patient is not able to provide ROS --confused and not  talking  [place]: Yes.  SOCIAL HISTORY:  reports that she has quit smoking. She has never used smokeless tobacco. She reports that she drinks alcohol. She reports that she does not use illicit drugs.  LEGAL DOCUMENTS:  DNR form in chart  CODE STATUS: DNR  PAST MEDICAL HISTORY: Past Medical History  Diagnosis Date  . Pneumonia   . Lung cancer ([place])   . DVT (deep venous thrombosis) ([place])   . Urinary incontinence   . HTN (hypertension)   . Chronic diastolic congestive heart failure ([place])   . Type 2 diabetes mellitus ([place]) 02/28/2015    PAST SURGICAL HISTORY:  Past Surgical History  Procedure Laterality Date  . Video bronchoscopy Bilateral 01/14/2013    Procedure: VIDEO BRONCHOSCOPY WITHOUT FLUORO;  Surgeon: Wilhelmina Mcardle, MD;  Location[facility] ENDOSCOPY;  Service: Cardiopulmonary;  Laterality: Bilateral;  . Video bronchoscopy N/A 01/27/2013    Procedure: VIDEO BRONCHOSCOPY;  Surgeon: Ivin Poot, MD;  Location: [facility];  Service: Thoracic;  Laterality: N/A;  . Video assisted thoracoscopy Right 01/27/2013    Procedure: VIDEO ASSISTED THORACOSCOPY;  Surgeon: Ivin Poot, MD;  Location: [facility];  Service: Thoracic;  Laterality: Right;  . Lung biopsy Right 01/27/2013    Procedure: LUNG BIOPSY;  Surgeon: Ivin Poot, MD;  Location: [facility];  Service: Thoracic;  Laterality: Right;  . Pericardial tap N/A 01/11/2013    Procedure: PERICARDIAL TAP;  Surgeon: Blane Ohara, MD;  Location: [facility]  CATH LAB;  Service: Cardiovascular;  Laterality: N/A;  . Bladder surgery      ALLERGIES:  is allergic to lorazepam; tape; and tegaderm ag mesh.  MEDICATIONS:  Current Facility-Administered Medications  Medication Dose Route Frequency Provider Last Rate Last Dose  . acetaminophen (TYLENOL) tablet 650 mg  650 mg Oral Q6H PRN Lance Coon, MD       Or  . acetaminophen (TYLENOL) suppository 650 mg  650 mg Rectal Q6H PRN Lance Coon, MD      . Chlorhexidine Gluconate Cloth 2 % PADS  6 each  6 each Topical Q0600 Lytle Butte, MD   6 each at 03/01/15 [phone]  . diazepam (VALIUM) injection 10 mg  10 mg Intravenous Q1H PRN Lytle Butte, MD   10 mg at 03/01/15 1102  . fentaNYL (DURAGESIC - dosed mcg/hr) 200 mcg  200 mcg Transdermal Q72H Lance Coon, MD   200 mcg at 02/28/15 0320  . haloperidol (HALDOL) tablet 0.5 mg  0.5 mg Oral Q4H PRN Lytle Butte, MD       Or  . haloperidol (HALDOL) 2 MG/ML solution 0.5 mg  0.5 mg Sublingual Q4H PRN Lytle Butte, MD       Or  . haloperidol lactate (HALDOL) injection 0.5 mg  0.5 mg Intravenous Q4H PRN Lytle Butte, MD      . HYDROmorphone (DILAUDID) injection 3 mg  3 mg Intravenous Q1H PRN Lytle Butte, MD   3 mg at 03/01/15 1412  . morphine 250 mg in dextrose 5 % 250 mL (1 mg/mL) infusion  10 mg/hr Intravenous Continuous Lytle Butte, MD 10 mL/hr at 03/01/15 1220 10 mg/hr at 03/01/15 1220  . mupirocin ointment (BACTROBAN) 2 % 1 application  1 application Nasal BID Lytle Butte, MD   1 application at 78/24/23 0920  . ondansetron (ZOFRAN) tablet 4 mg  4 mg Oral Q6H PRN Lance Coon, MD       Or  . ondansetron [facility]) injection 4 mg  4 mg Intravenous Q6H PRN Lance Coon, MD      . temazepam (RESTORIL) capsule 15-30 mg  15-30 mg Oral QHS PRN Lytle Butte, MD        Vital Signs: BP 108/66 mmHg  Pulse 128  Temp(Src) 97.6 F (36.4 C) (Oral)  Resp 20  Ht '5\' 4"'$  (1.626 m)  Wt 80.876 kg (178 lb 4.8 oz)  BMI 30.59 kg/m2  SpO2 74% Filed Weights   02/16/2015 2113 03/01/15 0513  Weight: 88.026 kg (194 lb 1 oz) 80.876 kg (178 lb 4.8 oz)    Estimated body mass index is 30.59 kg/(m^2) as calculated from the following:   Height as of this encounter: '5\' 4"'$  (1.626 m).   Weight as of this encounter: 80.876 kg (178 lb 4.8 oz).  PERFORMANCE STATUS (ECOG) : 4 - Bedbound --hasn't walked in a couple of months but now is sitting on side of bed and looks like she may try to stand (which would be unsafe).   PHYSICAL EXAM: She is awake and sitting  on the edge of the bed --but she has a distant affect --not quite focusing on me as I talk and not blinking much either --staring into space. Then, she was noted to be re-arranging things on her bedside tray. Then, she was moving the tray slowly this way and that, as if to get it at a certain angle --but this went on far too long to be normal.  She does not follow my simple commands though she is alert and says she is 'ok'.   She has an altered sensorium, though she is awake and restless Clear sclera, unable to test ocular movements Nares patent, OP clear Neck no JVD or TM Hrt rrr no m Lungs with faint wheeze in left base posteriorly Abd soft and NT Ext wrapped Unaboots both lower legs.   LABS: CBC:    Component Value Date/Time   WBC 8.2 02/28/2015 0534   WBC 9.6 04/25/2014 0645   HGB 10.2* 02/28/2015 0534   HGB 10.7* 04/25/2014 0645   HCT 32.3* 02/28/2015 0534   HCT 32.6* 04/25/2014 0645   PLT 109* 02/28/2015 0534   PLT 175 04/25/2014 0645   MCV 79.5* 02/28/2015 0534   MCV 90 04/25/2014 0645   NEUTROABS 4.1 06/28/2014 0512   NEUTROABS 8.3* 04/20/2014 1736   LYMPHSABS 0.9* 06/28/2014 0512   LYMPHSABS 0.4* 04/20/2014 1736   MONOABS 0.6 06/28/2014 0512   MONOABS 0.4 04/20/2014 1736   EOSABS 0.1 06/28/2014 0512   EOSABS 0.0 04/20/2014 1736   BASOSABS 0.0 06/28/2014 0512   BASOSABS 0.0 04/20/2014 1736   Comprehensive Metabolic Panel:    Component Value Date/Time   NA 137 02/28/2015 0534   NA 139 04/25/2014 0645   K 3.2* 02/28/2015 0534   K 3.6 04/25/2014 0645   CL 101 02/28/2015 0534   CL 105 04/25/2014 0645   CO2 25 02/28/2015 0534   CO2 27 04/25/2014 0645   BUN 21* 02/28/2015 0534   BUN 34* 04/25/2014 0645   CREATININE 0.60 02/28/2015 0534   CREATININE 0.64 04/25/2014 0645   GLUCOSE 283* 02/28/2015 0534   GLUCOSE 99 04/25/2014 0645   CALCIUM 7.7* 02/28/2015 0534   CALCIUM 8.6* 04/25/2014 0645   AST 26 02/03/2015 2128   AST 14* 04/25/2014 0645   ALT 31  02/07/2015 2128   ALT 14 04/25/2014 0645   ALKPHOS 86 02/20/2015 2128   ALKPHOS 49 04/25/2014 0645   BILITOT 0.7 02/03/2015 2128   BILITOT 0.2* 04/25/2014 0645   PROT 5.9* 02/21/2015 2128   PROT 5.5* 04/25/2014 0645   ALBUMIN 3.0* 02/24/2015 2128   ALBUMIN 3.2* 04/25/2014 0645     More than 50% of the visit was spent in counseling/coordination of care: Yes  Time Spent:  80 minutes

## 2015-03-02 MED ORDER — GLYCOPYRROLATE 0.2 MG/ML IJ SOLN
0.4000 mg | Freq: Three times a day (TID) | INTRAMUSCULAR | Status: DC | PRN
  Administered 2015-03-02 (×3): 0.4 mg via INTRAVENOUS
  Filled 2015-03-02 (×3): qty 2

## 2015-03-02 MED ORDER — BISACODYL 10 MG RE SUPP: 10.0000 mg | Freq: Every day | RECTAL | Status: DC | PRN

## 2015-03-02 NOTE — Progress Notes (Signed)
CSW spoke to MD Hower to receive an update on patient. Per MD patient is currently on comfort measures and is unstable for transport at this time. CSW spoke to reviewed Palliative Care MD Megan Salon notes and patient's chart and discussed patient with [place] Liaison. Patient is currently unstable for transport at this time. CSW will continue to follow and assist.   Ernest Pine, MSW, [place] Work Department [phone]

## 2015-03-02 NOTE — Progress Notes (Signed)
Palliative Medicine Inpatient Consult Follow Up Note   Name: [name] Date: 03/02/2015 MRN: [id number]  DOB: 05-31-1960  Referring Physician: Lytle Butte, MD  Palliative Care consult requested for this 55 y.o. female for goals of medical therapy in patient with stage 4 adenocarcinoma of the lung.     TODAY'S DISCUSSIONS AND DECISIONS: Pt is now close to death. She has had her restlessness addressed and her pain seems controlled with current orders. Valium was needed for her restlessness.  She is now sleeping, but does open her eyes w/o focusing briefly during my exam.  She still has even respirations, but she also has some cyanosis of fingers and toes.    She is resting and symptoms are controlled and family is pleased about this.  She is too unstable and close to dying to transfer anywhere.    I have talked with [name] and many other family members who are arriving.  Supportive conversation.  Will check back later today.    Will add [place] to her orders and place a comfort measures order as well for clarification that this is a terminal comfort care situation.  Discussed with hospice liaison nurse and will talk with Dr Lavetta Nielsen.     CODE STATUS: DNR   PAST MEDICAL HISTORY: Past Medical History  Diagnosis Date  . Pneumonia   . Lung cancer ([place])   . DVT (deep venous thrombosis) ([place])   . Urinary incontinence   . HTN (hypertension)   . Chronic diastolic congestive heart failure ([place])   . Type 2 diabetes mellitus ([place]) 02/28/2015    PAST SURGICAL HISTORY:  Past Surgical History  Procedure Laterality Date  . Video bronchoscopy Bilateral 01/14/2013    Procedure: VIDEO BRONCHOSCOPY WITHOUT FLUORO;  Surgeon: Wilhelmina Mcardle, MD;  [facility] ENDOSCOPY;  Service: Cardiopulmonary;  Laterality: Bilateral;  . Video bronchoscopy N/A 01/27/2013    Procedure: VIDEO BRONCHOSCOPY;  Surgeon: Ivin Poot, MD;  Location: [place];  Service: Thoracic;  Laterality: N/A;  . Video  assisted thoracoscopy Right 01/27/2013    Procedure: VIDEO ASSISTED THORACOSCOPY;  Surgeon: Ivin Poot, MD;  Location: [place];  Service: Thoracic;  Laterality: Right;  . Lung biopsy Right 01/27/2013    Procedure: LUNG BIOPSY;  Surgeon: Ivin Poot, MD;  Location: [place];  Service: Thoracic;  Laterality: Right;  . Pericardial tap N/A 01/11/2013    Procedure: PERICARDIAL TAP;  Surgeon: Blane Ohara, MD;  Location: [facility] CATH LAB;  Service: Cardiovascular;  Laterality: N/A;  . Bladder surgery      Vital Signs: BP 112/67 mmHg  Pulse 145  Temp(Src) 98.6 F (37 C) (Oral)  Resp 20  Ht _0  (1.626 m)  Wt 80.876 kg (178 lb 4.8 oz)  BMI 30.59 kg/m2  SpO2 72% Filed Weights   02/14/2015 2113 03/01/15 0513  Weight: 88.026 kg (194 lb 1 oz) 80.876 kg (178 lb 4.8 oz)    Estimated body mass index is 30.59 kg/(m^2) as calculated from the following:   Height as of this encounter: _1  (1.626 m).   Weight as of this encounter: 80.876 kg (178 lb 4.8 oz).  PHYSICAL EXAM: Sleeping --obtunded Opens eyes when I repositioned her head a bit  Does not focus Lips dry Neck w/o JVD but obvious soft tissue met lesions noted Hrt rrr no m Lungs even deep respirations with periods of apnea Abd soft , nl BS Ext cyanosis of toes and fingers noted --no mottling seen however  More than 50% of the visit was spent in counseling/coordination of care: YES  Time Spent: 25 min

## 2015-03-02 NOTE — Progress Notes (Signed)
Visit made. Patient seen lying in bed, some restlessness noted, pulling oxygen off. Legs mottled, face and neck mottled. Sister in law Henri, daughter [name] and son Quita Skye present all in agreement for PRN valium to be given for restlessness. Staff RN Andee Poles notified and medication given. Patient relaxed, repositioned, audible secretions noted. Md to be notified for need of secretion management. [name] has contacted patient's brother and mother regarding the changes in [place] and will be coming to the hospital. [name] has requested that [place] not be moved from the hospital. [facility] notified. Dr. Megan Salon updated. Hospice team updated. Flo Shanks RN, BSN, [place] and Palliative Care of [place], [facility] [phone] c

## 2015-03-02 NOTE — Progress Notes (Signed)
[facility] [place] NAME: Nataleah Scioneaux    MR#:  [id number]  DATE OF BIRTH:  November 29, 1960  SUBJECTIVE:  Actively dying Patient has multiple family at bedside including healthcare power of attorney daughter [name]  REVIEW OF SYSTEMS:  Unable to obtain this time given patient's mental status medical condition  DRUG ALLERGIES:   Allergies  Allergen Reactions  . Lorazepam Other (See Comments)    Reaction Unknown Listed on Nursing Home MAR  . Tape Other (See Comments)    Reaction Unknown Listed on Nursing Home MAR  . Tegaderm Ag Mesh [Silver] Other (See Comments)    Reaction Unknown Listed on Nursing Home MAR    VITALS:  Blood pressure 112/67, pulse 145, temperature 98.6 F (37 C), temperature source Oral, resp. rate 20, height '5\' 4"'$  (1.626 m), weight 80.876 kg (178 lb 4.8 oz), SpO2 72 %.  PHYSICAL EXAMINATION:  VITAL SIGNS: Filed Vitals:   03/01/15 0513 03/02/15 0557  BP: 108/66 112/67  Pulse: 128 145  Temp: 97.6 F (36.4 C) 98.6 F (37 C)  Resp:  67   GENERAL:55 y.o.female currently critically ill, actively dying HEAD: Normocephalic, atraumatic.  EYES: Pupils equal, round, reactive to light. Extraocular muscles intact. No scleral icterus.  MOUTH: Dry mucosal membrane. Dentition intact. No abscess noted.  EAR, NOSE, THROAT: Clear without exudates. No external lesions.  NECK: Supple. No thyromegaly. No nodules. No JVD.  PULMONARY: Diffuse coarse breath sounds, without wheeze rails or rhonci. No use of accessory muscles, poor respiratory effort. Poor air entry bilaterally CHEST: Nontender to palpation.  CARDIOVASCULAR: S1 and S2. Regular rate and rhythm. No murmurs, rubs, or gallops. No edema. Pedal pulses 2+ bilaterally.  GASTROINTESTINAL: Soft, nontender, nondistended. No masses. Positive bowel sounds. No hepatosplenomegaly.  MUSCULOSKELETAL: No swelling, clubbing, or edema. Range of motion full in all extremities.   NEUROLOGIC: Unable to fully assess given mental status SKIN: Sacral ulcer present on admission otherwise No ulceration, lesions, rashes, positive cyanosis.  PSYCHIATRIC: Currently resting comfortably unable to fully assess given mental status     LABORATORY PANEL:   CBC  Recent Labs Lab 02/28/15 0534  WBC 8.2  HGB 10.2*  HCT 32.3*  PLT 109*   ------------------------------------------------------------------------------------------------------------------  Chemistries   Recent Labs Lab 02/20/2015 2128 02/28/15 0534  NA 134* 137  K 4.9 3.2*  CL 102 101  CO2 22 25  GLUCOSE 220* 283*  BUN 21* 21*  CREATININE 0.56 0.60  CALCIUM 8.0* 7.7*  MG  --  1.9  AST 26  --   ALT 31  --   ALKPHOS 86  --   BILITOT 0.7  --    ------------------------------------------------------------------------------------------------------------------  Cardiac Enzymes No results for input(s): TROPONINI in the last 168 hours. ------------------------------------------------------------------------------------------------------------------  RADIOLOGY:  No results found.  EKG:   Orders placed or performed during the hospital encounter of 02/13/2015  . EKG 12-Lead  . EKG 12-Lead  . ED EKG  . ED EKG    ASSESSMENT AND PLAN:   55 year old Caucasian female history of stage IV lung cancer no metastases who was admitted 02/18/2015 with worsening rest or status. During the course of hospitalization she decided that she no longer would pursue aggressive measures and she has been made comfort measures only.  1. Acute on chronic respiratory failure with hypoxia 2. Stage IV adenocarcinoma of the lung 3. Type 2 diabetes insulin requiring   Comfort measures only Continuous morphine infusion, when necessary Dilaudid, when necessary Valium Case  discussed with hospice nurse as well as palliative care  Patient actively dying appears comfortable at this time    All the records are reviewed and case  discussed with Care Management/Social Workerr. Management plans discussed with the patient, family and they are in agreement.  CODE STATUS: DO NOT RESUSCITATE/comfort measures only  TOTAL TIME TAKING CARE OF THIS PATIENT: 28 minutes.   POSSIBLE D/C IN 1 DAYS, DEPENDING ON CLINICAL CONDITION.   Bebe Moncure,  Karenann Cai.D on 03/02/2015 at 12:45 PM  Between 7am to 6pm - Pager - [phone]  After 6pm: House Pager: - [phone]  [facility]  Office  [phone]  CC: Primary care physician; Pcp Not In System

## 2015-03-02 DEATH — deceased

## 2015-03-04 LAB — CULTURE, BLOOD (ROUTINE X 2)
Culture: NO GROWTH
Culture: NO GROWTH

## 2015-04-02 NOTE — Clinical Social Work Note (Signed)
Pt expired this morning. Family is not at bedside. CSW is signing off as no further needs identified.   Darden Dates, MSW, LCSW  Clinical Social Worker [phone]

## 2015-04-02 NOTE — Discharge Summary (Signed)
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 Pneumonia   . Lung cancer ([place])   . DVT (deep venous thrombosis) ([place])   . Urinary incontinence   . HTN (hypertension)   . Chronic diastolic congestive heart failure ([place])   . Type 2 diabetes mellitus ([place]) 02/28/2015    HOSPITAL COURSE:  [name]  is a 55 y.o. female admitted 02/14/2015 with chief complaint  shortness of breath please see H&P performed by Dr. Jannifer Franklin for further information. She was originally admitted to the intensive care unit requiring BiPAP therapy chest x-ray findings consistent with either worsening cancer versus infectious process. Started on broad-spectrum antibiotics however no other indication of infection. The patient decided she no longer wanted aggressive therapy removed BiPAP. I had a lengthy discussion with the patient as well as her family goals of care. The decision was made to transition over to comfort. Palliative care consulted for assistance.   deceased

## 2015-04-02 NOTE — Progress Notes (Signed)
All of pts personal belongings including her jewelry sent home with daughter

## 2015-04-02 NOTE — Progress Notes (Signed)
Patient expired at 0510, noted no respirations, heartbeat and no pulse, verified by writer and Marijean Niemann, RN. Family at bedside, MD notified, [facility] notified, [facility] notified.

## 2015-04-02 DEATH — deceased

## 2016-06-11 IMAGING — US US EXTREM LOW VENOUS*L*
1 series · 13 of 24 positions shown · non-contrast
Comparison: 09/11/2013 right lower extremity venous ultrasound.

CLINICAL DATA: Left leg swelling.  History of lung cancer.



[Series 1: us extrem low venous*left* · 0.08mm/px · 13 of 35 slices shown]
[im 1/35]
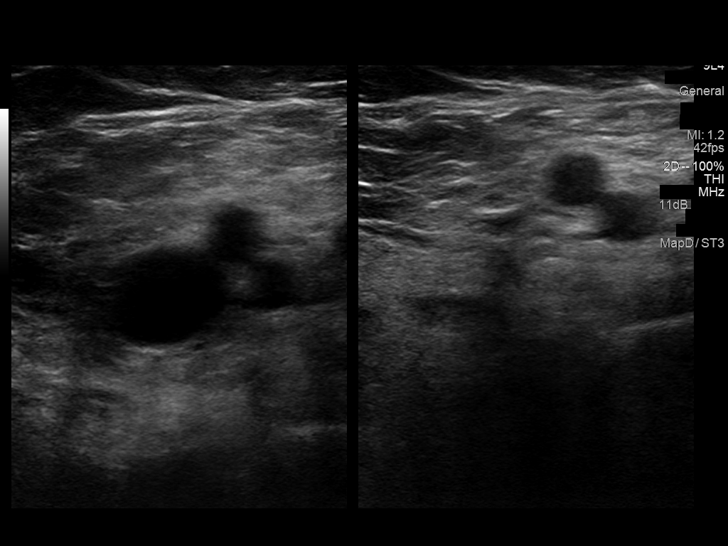
[im 3/35]
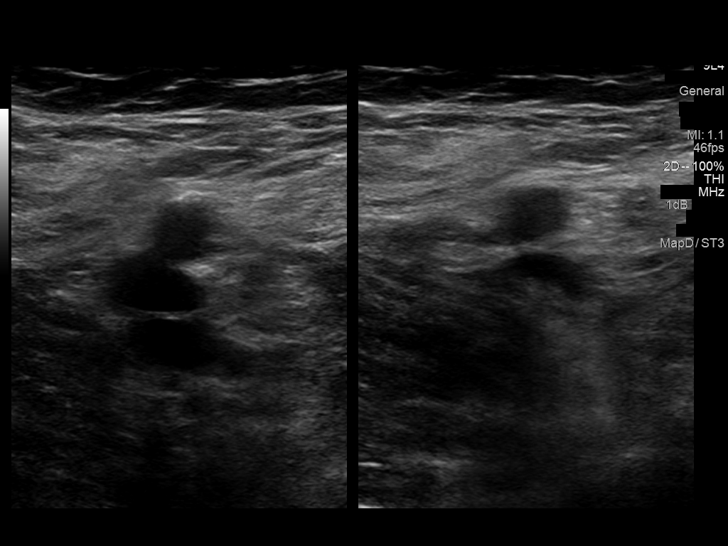
[im 6/35]
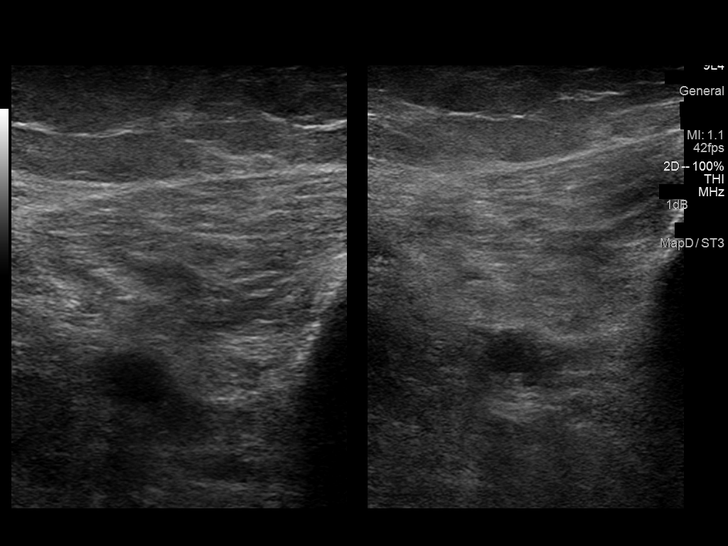
[im 9/35]
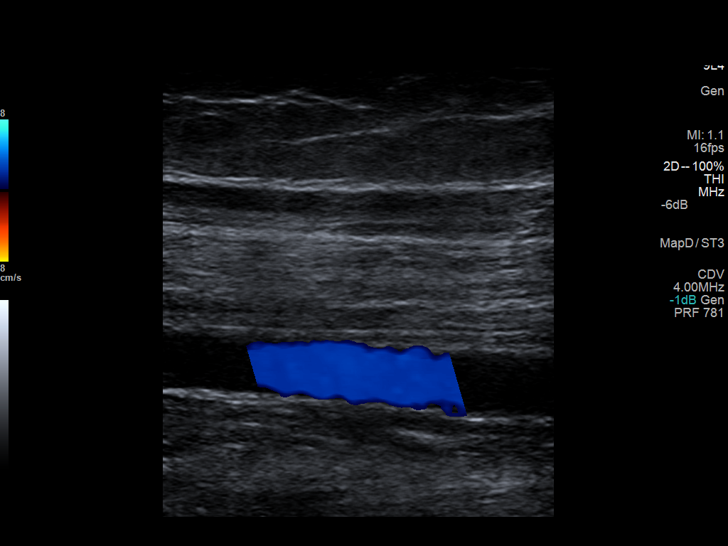
[im 12/35]
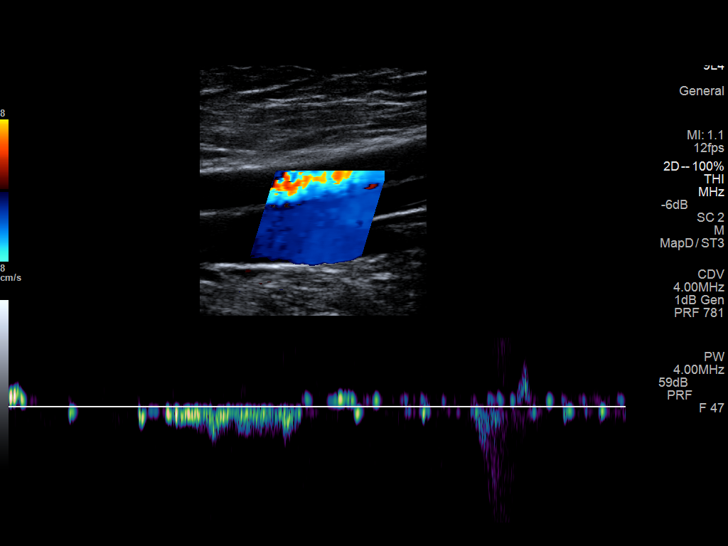
[im 15/35]
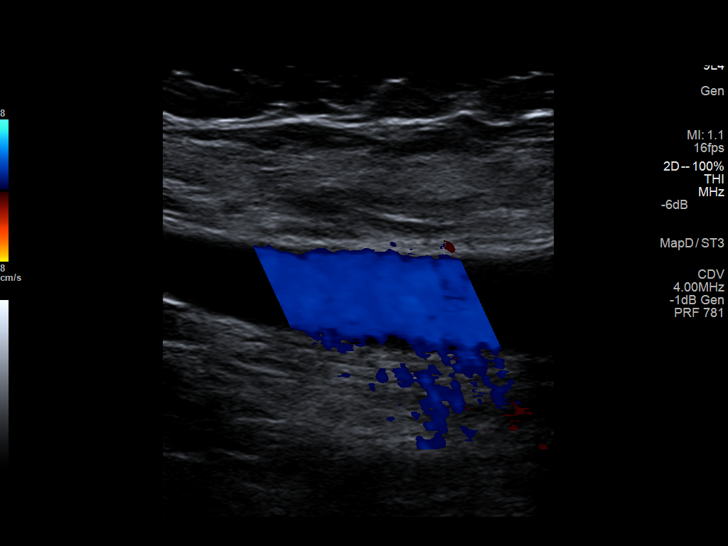
[im 18/35]
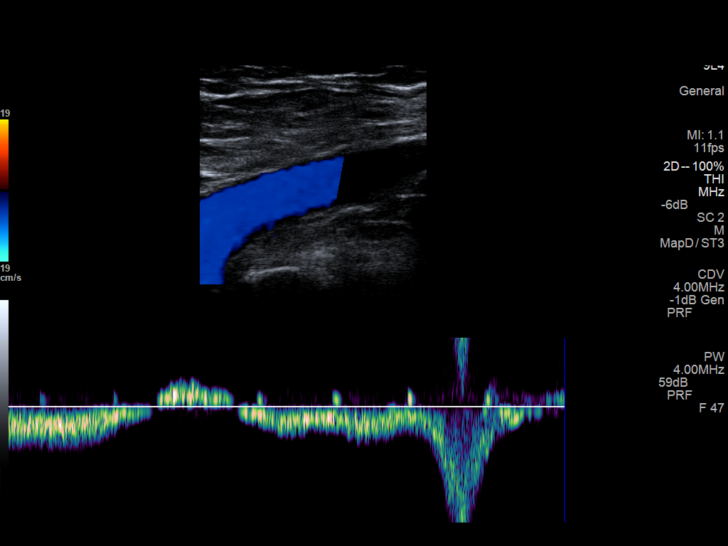
[im 20/35]
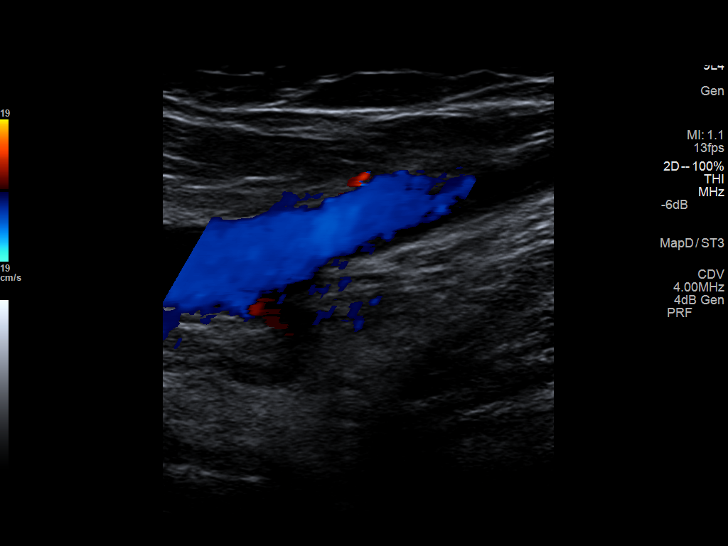
[im 23/35]
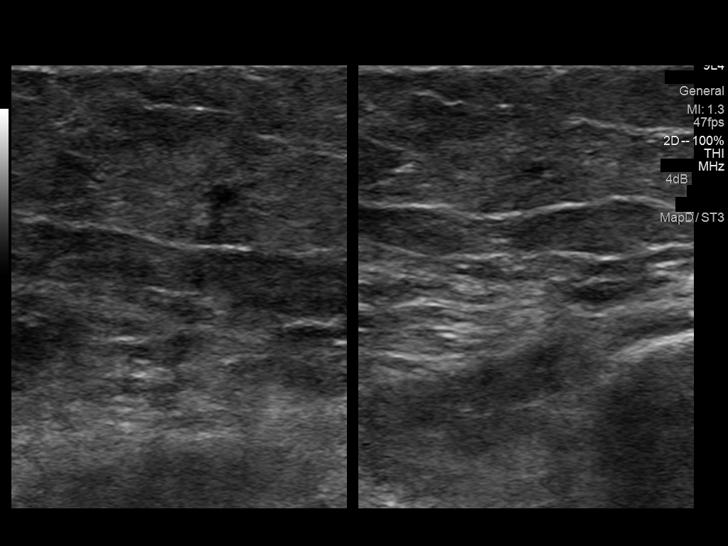
[im 26/35]
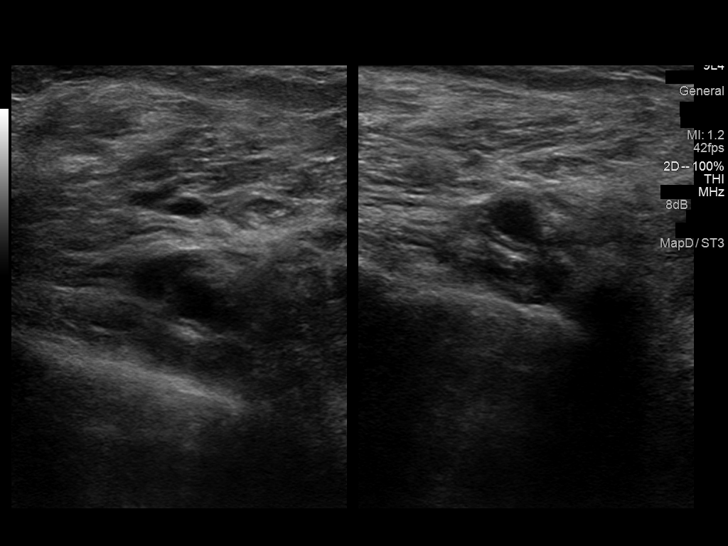
[im 29/35]
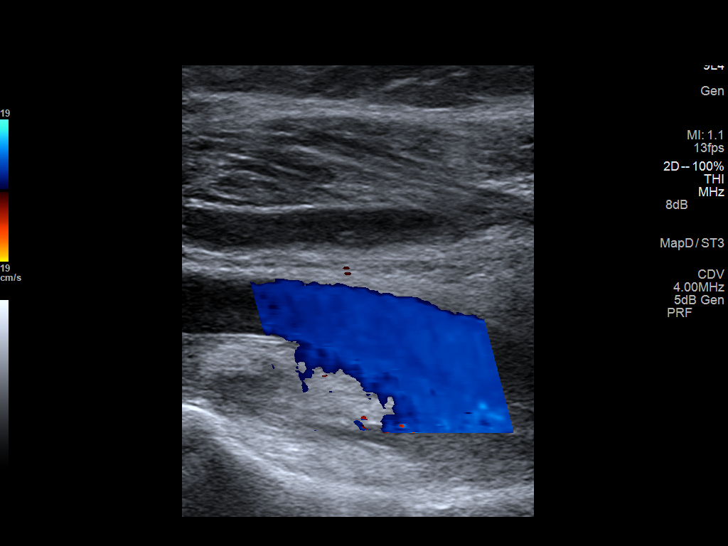
[im 32/35]
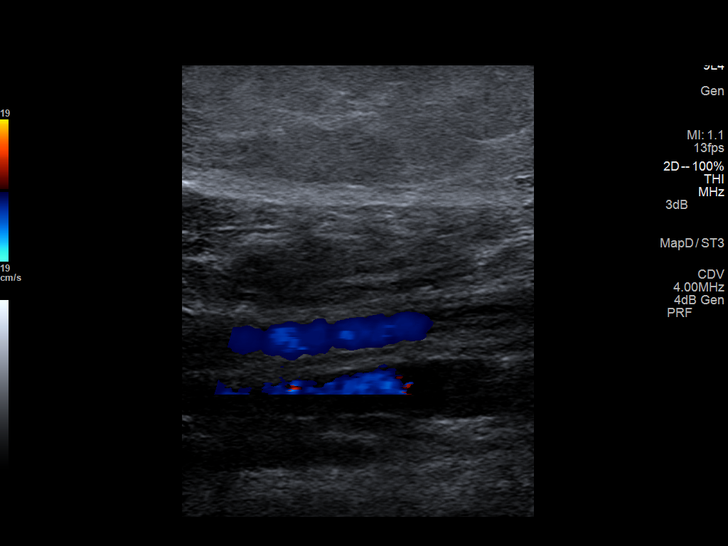
[im 35/35]
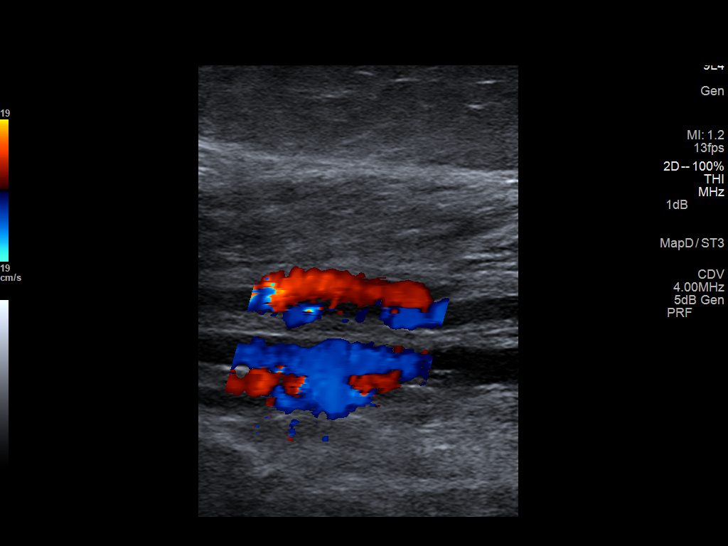

[13 of 24 positions shown; findings below may reference images not displayed]

FINDINGS: Common Femoral Vein: No evidence of thrombus. Normal
compressibility, respiratory phasicity and response to augmentation.

Saphenofemoral Junction: No evidence of thrombus. Normal
compressibility and flow on color Doppler imaging.

Profunda Femoral Vein: No evidence of thrombus. Normal
compressibility and flow on color Doppler imaging.

Femoral Vein: No evidence of thrombus. Normal compressibility,
respiratory phasicity and response to augmentation.

Popliteal Vein: No evidence of thrombus. Normal compressibility,
respiratory phasicity and response to augmentation.

Calf Veins: No evidence of thrombus. Normal compressibility and flow
on color Doppler imaging.

Superficial Great Saphenous Vein: No evidence of thrombus. Normal
compressibility and flow on color Doppler imaging.

Venous Reflux:  None.

Other Findings:  None.
IMPRESSION: No evidence of deep venous thrombosis in the left lower extremity.

## 2016-06-26 IMAGING — CR DG CHEST 1V PORT
1 series · 1 of 1 positions shown · non-contrast
Comparison: 08/20/2013 and 07/11/2013

CLINICAL DATA: Chills, sweats and general weakness. History of lung
cancer.

EXAM:
PORTABLE CHEST - 1 VIEW

[ap]
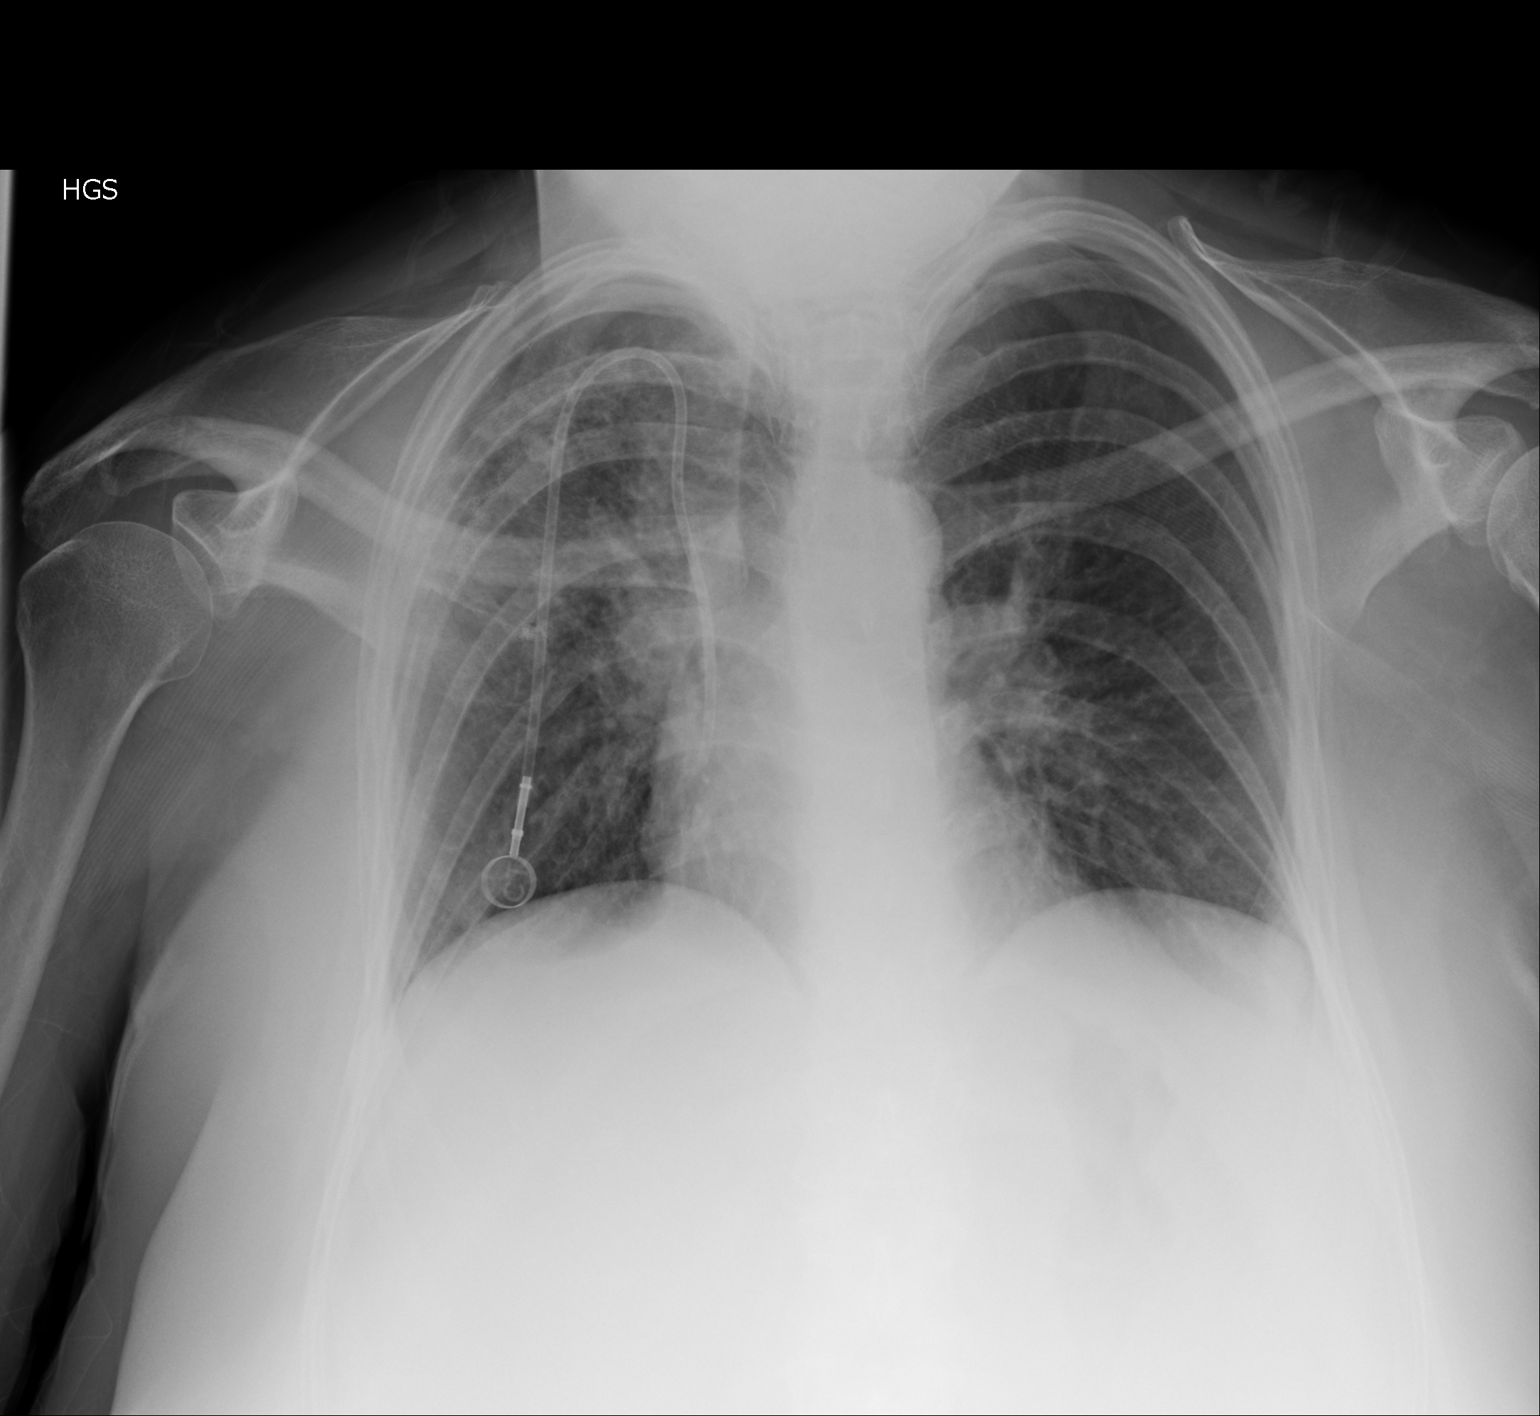

[1 of 1 positions shown; findings below may reference images not displayed]

FINDINGS: There is a right jugular Port-A-Cath. Catheter tip is in the lower
SVC region. The left lung remains clear. There continues to be
patchy densities throughout the right upper lung which have not
significantly changed. Heart size is normal.
IMPRESSION: Persistent densities throughout the right upper chest. Cannot
exclude acute-on-chronic disease in this area. However, there has
not been a significant change since the previous examination.
